# Patient Record
Sex: Female | Born: 1935 | Race: White | Hispanic: No | Marital: Married | Smoking: Never smoker
Health system: Southern US, Community
[De-identification: ages and names within clinical notes are randomized; demographics above are authoritative.]

## PROBLEM LIST (undated history)

## (undated) DIAGNOSIS — R42 Dizziness and giddiness: Secondary | ICD-10-CM

## (undated) DIAGNOSIS — I10 Essential (primary) hypertension: Secondary | ICD-10-CM

## (undated) DIAGNOSIS — N816 Rectocele: Secondary | ICD-10-CM

## (undated) DIAGNOSIS — M1990 Unspecified osteoarthritis, unspecified site: Secondary | ICD-10-CM

## (undated) DIAGNOSIS — K219 Gastro-esophageal reflux disease without esophagitis: Secondary | ICD-10-CM

## (undated) DIAGNOSIS — C801 Malignant (primary) neoplasm, unspecified: Secondary | ICD-10-CM

## (undated) DIAGNOSIS — F0390 Unspecified dementia without behavioral disturbance: Secondary | ICD-10-CM

## (undated) DIAGNOSIS — C55 Malignant neoplasm of uterus, part unspecified: Secondary | ICD-10-CM

## (undated) DIAGNOSIS — Z972 Presence of dental prosthetic device (complete) (partial): Secondary | ICD-10-CM

## (undated) DIAGNOSIS — E7800 Pure hypercholesterolemia, unspecified: Secondary | ICD-10-CM

## (undated) DIAGNOSIS — G8929 Other chronic pain: Secondary | ICD-10-CM

## (undated) DIAGNOSIS — F419 Anxiety disorder, unspecified: Secondary | ICD-10-CM

## (undated) DIAGNOSIS — G4730 Sleep apnea, unspecified: Secondary | ICD-10-CM

## (undated) DIAGNOSIS — J45909 Unspecified asthma, uncomplicated: Secondary | ICD-10-CM

## (undated) DIAGNOSIS — H3530 Unspecified macular degeneration: Secondary | ICD-10-CM

## (undated) DIAGNOSIS — I4510 Unspecified right bundle-branch block: Secondary | ICD-10-CM

## (undated) DIAGNOSIS — F32A Depression, unspecified: Secondary | ICD-10-CM

## (undated) DIAGNOSIS — R51 Headache: Secondary | ICD-10-CM

## (undated) DIAGNOSIS — H409 Unspecified glaucoma: Secondary | ICD-10-CM

## (undated) DIAGNOSIS — F329 Major depressive disorder, single episode, unspecified: Secondary | ICD-10-CM

## (undated) DIAGNOSIS — R519 Headache, unspecified: Secondary | ICD-10-CM

## (undated) DIAGNOSIS — IMO0002 Reserved for concepts with insufficient information to code with codable children: Secondary | ICD-10-CM

## (undated) HISTORY — PX: ABDOMINAL HYSTERECTOMY: SHX81

## (undated) HISTORY — DX: Malignant neoplasm of uterus, part unspecified: C55

## (undated) HISTORY — DX: Anxiety disorder, unspecified: F41.9

## (undated) HISTORY — DX: Other chronic pain: G89.29

## (undated) HISTORY — DX: Unspecified asthma, uncomplicated: J45.909

## (undated) HISTORY — DX: Rectocele: N81.6

## (undated) HISTORY — PX: VULVECTOMY: SHX1086

## (undated) HISTORY — PX: OTHER SURGICAL HISTORY: SHX169

## (undated) HISTORY — PX: REPLACEMENT TOTAL KNEE: SUR1224

## (undated) HISTORY — DX: Major depressive disorder, single episode, unspecified: F32.9

## (undated) HISTORY — DX: Unspecified right bundle-branch block: I45.10

## (undated) HISTORY — DX: Unspecified osteoarthritis, unspecified site: M19.90

## (undated) HISTORY — DX: Essential (primary) hypertension: I10

## (undated) HISTORY — DX: Depression, unspecified: F32.A

## (undated) HISTORY — PX: APPENDECTOMY: SHX54

## (undated) HISTORY — DX: Gastro-esophageal reflux disease without esophagitis: K21.9

## (undated) HISTORY — DX: Headache, unspecified: R51.9

## (undated) HISTORY — DX: Sleep apnea, unspecified: G47.30

## (undated) HISTORY — DX: Headache: R51

## (undated) HISTORY — PX: CHOLECYSTECTOMY: SHX55

## (undated) HISTORY — PX: ABDOMINAL HYSTERECTOMY: SUR658

## (undated) HISTORY — DX: Unspecified glaucoma: H40.9

## (undated) HISTORY — DX: Pure hypercholesterolemia, unspecified: E78.00

## (undated) HISTORY — DX: Reserved for concepts with insufficient information to code with codable children: IMO0002

---

## 2003-10-08 ENCOUNTER — Emergency Department (HOSPITAL_COMMUNITY): Admission: AD | Admit: 2003-10-08 | Discharge: 2003-10-08 | Payer: Self-pay | Admitting: Family Medicine

## 2004-01-05 ENCOUNTER — Inpatient Hospital Stay (HOSPITAL_COMMUNITY): Admission: RE | Admit: 2004-01-05 | Discharge: 2004-01-09 | Payer: Self-pay | Admitting: Orthopaedic Surgery

## 2004-01-09 ENCOUNTER — Inpatient Hospital Stay
Admission: RE | Admit: 2004-01-09 | Discharge: 2004-01-17 | Payer: Self-pay | Admitting: Physical Medicine & Rehabilitation

## 2004-07-07 ENCOUNTER — Encounter: Payer: Self-pay | Admitting: Orthopaedic Surgery

## 2004-09-15 ENCOUNTER — Inpatient Hospital Stay: Payer: Self-pay | Admitting: Rheumatology

## 2004-09-15 ENCOUNTER — Other Ambulatory Visit: Payer: Self-pay

## 2004-09-21 ENCOUNTER — Other Ambulatory Visit: Payer: Self-pay

## 2004-09-21 ENCOUNTER — Emergency Department: Payer: Self-pay | Admitting: Emergency Medicine

## 2004-11-12 ENCOUNTER — Ambulatory Visit: Payer: Self-pay | Admitting: Internal Medicine

## 2005-03-05 ENCOUNTER — Ambulatory Visit: Payer: Self-pay | Admitting: Gastroenterology

## 2005-07-23 ENCOUNTER — Encounter: Payer: Self-pay | Admitting: Orthopaedic Surgery

## 2005-08-07 ENCOUNTER — Encounter: Payer: Self-pay | Admitting: Orthopaedic Surgery

## 2006-03-11 ENCOUNTER — Ambulatory Visit: Payer: Self-pay | Admitting: Internal Medicine

## 2007-09-16 ENCOUNTER — Ambulatory Visit: Payer: Self-pay | Admitting: Internal Medicine

## 2007-11-08 ENCOUNTER — Emergency Department (HOSPITAL_COMMUNITY): Admission: EM | Admit: 2007-11-08 | Discharge: 2007-11-08 | Payer: Self-pay | Admitting: Emergency Medicine

## 2007-11-13 ENCOUNTER — Ambulatory Visit: Payer: Self-pay | Admitting: Internal Medicine

## 2007-12-01 ENCOUNTER — Ambulatory Visit: Payer: Self-pay | Admitting: Internal Medicine

## 2008-10-11 ENCOUNTER — Ambulatory Visit: Payer: Self-pay | Admitting: Internal Medicine

## 2009-09-07 ENCOUNTER — Ambulatory Visit: Payer: Self-pay | Admitting: Internal Medicine

## 2009-12-04 ENCOUNTER — Ambulatory Visit: Payer: Self-pay | Admitting: Otolaryngology

## 2009-12-13 ENCOUNTER — Ambulatory Visit: Payer: Self-pay | Admitting: Unknown Physician Specialty

## 2010-12-04 ENCOUNTER — Ambulatory Visit: Payer: Self-pay | Admitting: Specialist

## 2010-12-20 ENCOUNTER — Ambulatory Visit: Payer: Self-pay | Admitting: Internal Medicine

## 2011-02-22 NOTE — Discharge Summary (Signed)
[name], [name]                           ACCOUNT NO.:  [id number]   MEDICAL RECORD NO.:  [id number]                   PATIENT TYPE:  INP   LOCATION:  5031                                 FACILITY:  [facility]   PHYSICIAN:  Lubertha Basque. Jerl Santos, M.D.             DATE OF BIRTH:  1926-07-05   DATE OF ADMISSION:  01/05/2004  DATE OF DISCHARGE:  01/09/2004                                 DISCHARGE SUMMARY   ADMITTING DIAGNOSES:  1. Left knee end-stage degenerative joint disease.  2. Hypercholesterolemia.  3. Depression.   DISCHARGE DIAGNOSES:  1. Left knee end-stage degenerative joint disease.  2. Hypercholesterolemia.  3. Depression.   OPERATIONS:  Left total knee replacement.   BRIEF HISTORY:  [name] is a 77 year old white female who is a  patient well-known to our practice.  She has been complaining of left knee  pain for quite some time.  She is having pain when she walks and now pain  when she tries to rest in the evening that prevents her from sleeping  soundly.  She has been on multiple non-steroidal anti-inflammatory drugs,  all of which have helped for short periods of time but no long-lasting  benefits.  She has had multiple injections of corticosteroids,  intraarticular, which have also only helped for short periods of time.  Her  x-rays, standing--weightbearing, reveal end-stage DJD of the left knee.  We  have discussed treatment options with [name], that being a total knee  replacement on the left side, due to the fact that she has failed  conservative treatment and her symptoms warrant such a procedure i.e. pain  when she walks, trouble resting at nighttime.   PERTINENT LABORATORY AND X-RAY FINDINGS:  EKG was done and shows normal  sinus rhythm.  She had serial pro times drawn and she was on low-dose  Coumadin protocol and Lovenox initially for DVT prophylaxis.  The last INR  that I have recorded in her chart is 1.6.  The additional lab work, WBC is  10.5,  RBC is 3.21, hemoglobin 9.8.  This did drop from 13.6 postoperatively.  Hematocrit also 29.1 with a drop from 39.1.  Sodium 139, potassium 4.4,  glucose is 90, calcium minimally elevated at 10.6, creatinine 0.9, BUN 10.   COURSE IN THE HOSPITAL:  [name] was admitted postoperatively.  She was  placed on Ancef a gram q.8h. x 3 doses.  She was put on Percocet for pain as  well as PCA standing orders for a morphine pump.  She also was put on  Coumadin protocol and Lovenox for DVT prophylaxis to be regulated by  Pharmacy.  She was also put back on her home medications of Neurontin 100 mg  1 p.o. t.i.d., Zoloft 40 mg 1 a day, Zocor 20 mg 1 a day, and trazodone 100  mg p.o. q.d. p.r.n., knee-high TED stockings.  A Foley catheter was  used.  Knee immobilizer initially.  Physical therapy, occupational therapy, and  rehab consults were also done.  A CPM machine was ordered for 0 to 60 and  advance as tolerated, and then additional lab work postoperatively to check  on her blood counts, as mentioned above, and pro times.  She was given O2  oxygen through nasal cannula, 2 liters for the first 24 hours, and her  dressing was to be reinforced as needed and weightbearing as tolerated.   Her first day postoperatively, her vital signs were recorded as stable.  Her  Foley catheter was in.  Her PCA was controlling her discomfort, and Pharmacy  was helping with DVT prophylaxis with Lovenox and Coumadin.  The second day  postoperatively, her temperature was 100.9, hemoglobin dropped to 9.8, and  dressing was changed, her drain was pulled.  Her wound was noted to be  benign without signs of infection.  Her calf was soft.  No clinical evidence  of a DVT.  Her morphine PCA pump was discontinued.  Vicodin was ordered at  that time.  An OT consult was ordered and was done as well.  A Rehab/SACU  consult was also done during her hospital stay and Therapy had suggested a  stay in one of these units and it looked  like she would be going to SACU.  She could be weightbearing as tolerated on her leg, and on April 4, her  hemoglobin was 9.8, her hematocrit was 29.1, her INR was 1.6, blood pressure  153/64, temperature was 98.  Her wound was benign without sign of infection.  She had normal neurovascular status through her toes.  Her lungs were clear  to A and P.  Her cardiac revealed S1 and S2 without murmur, gallop, or rub.  Her abdomen was soft with positive bowel sounds and no guarding.  Her CPM  machine was going from 0 to 70 degrees and a bed was available in the SACU  unit.   CONDITION ON DISCHARGE:  Improved.   FOLLOW UP:  Followup is as follows:  She will remain on Coumadin regulated  by Pharmacy.  She will remain on Vicodin for discomfort, 1 or 2 p.o. q.4-6h.  for pain.  She will remain on her home medications which were:  1. Neurontin 100 mg 1 p.o. t.i.d.  2. Zoloft 40 mg 1 p.o. q.d.  3. Zocor 20 mg 1 p.o. q.d.  4. Trazodone 100 mg 1 p.o. q.d.  5. An iron pill, ferrous sulfate, was started, 325 mg 1 p.o. q.h.s. for     decrease in hemoglobin postoperatively.   She can be weightbearing as tolerated.  Dressings can be changed on a daily  basis.  Staples would come out at the two-week mark.  She could leave off  the knee immobilizer when she was strong enough to weight bear without it.  Her diet at this point was unrestricted.  We would continue to follow her on  the rehab unit and then on discharge, back in our office.  Our phone number  is [phone] for follow-up appointment.      Lindwood Qua, P.A.                    Lubertha Basque Jerl Santos, M.D.    MC/MEDQ  D:  01/10/2004  T:  01/10/2004  Job:  [id number]

## 2011-02-22 NOTE — Discharge Summary (Signed)
[name], [name]                           ACCOUNT NO.:  [id number]   MEDICAL RECORD NO.:  [id number]                   PATIENT TYPE:  ORB   LOCATION:                                       FACILITY:  [facility]   PHYSICIAN:  Ellwood Dense, M.D.                DATE OF BIRTH:  02-05-1926   DATE OF ADMISSION:  01/09/2004  DATE OF DISCHARGE:  01/17/2004                                 DISCHARGE SUMMARY   DISCHARGE DIAGNOSES:  1. Left total knee replacement secondary to degenerative joint disease,     January 05, 2004.  2. Pain management.  3. Coumadin for deep venous thrombosis prophylaxis.  4. Anemia.  5. Gastroesophageal reflux disease.  6. Hypertension.  7. Hyperlipidemia.  8. Osteoporosis.   HISTORY OF PRESENT ILLNESS:  [age]-year-old white female, admitted  January 05, 2004 with end-stage degenerative joint disease of the left knee,  no relief with conservative care.  Underwent a left total knee replacement,  January 05, 2004, per Dr. Lubertha Basque. Dalldorf.  Placed on Coumadin for deep  venous thrombosis prophylaxis, weightbearing as tolerated, postoperative  pain management and hospital course uneventful.  Latest labs showed an INR  of 1.6, hemoglobin 9.8; admitted for a comprehensive rehab program.   PAST MEDICAL HISTORY:  See discharge diagnoses.   PAST SURGICAL HISTORY:  1. Cholecystectomy.  2. Hysterectomy.   HABITS:  No alcohol or tobacco.   ALLERGIES:  CODEINE and VALIUM.   PRIMARY CARE Ronan Dion:  Dr. Lorin Picket at [facility].   MEDICATIONS PRIOR TO ADMISSION:  1. Zocor.  2. Mobic.  3. Trazodone.  4. Actonel weekly.  5. Neurontin 3 times daily.   SOCIAL HISTORY:  She lives with husband in [place], independent with a  walker prior to admission, 2 local daughters that work, 1-level home with 1  step to entry.   HOSPITAL COURSE:  Patient with progressive gains while in rehab services  with therapies initiated on a daily basis.  The following issues were  followed during patient's rehab course:  Pertaining to [name] left  total knee replacement, surgical site healing nicely, no signs of infection.  She was ambulating at supervision with a walker, weightbearing as tolerated,  range of motion of 80 degrees.  Home health therapies had been arranged.  She remained on Coumadin for deep venous thrombosis prophylaxis, latest INR  of 3.0.  She would be followed by [facility] to complete Coumadin  protocol.  Pain management ongoing with the use of Vicodin and Neurontin  with good results.  Postoperative anemia with latest hemoglobin of 10.1,  hematocrit 29; there were no bleeding episodes; she remained on iron  supplement.  Her blood pressures remained controlled without the use of  antihypertensives, systolic pressures 136, 134 and 154. She would remain on  her Zocor for history of hyperlipidemia.  She had no bowel or bladder  disturbances.  She would be discharged to home with home health physical  therapy as well as a nurse for [facility].   DISCHARGE MEDICATIONS:  Discharge medications included:  1. Coumadin with latest dose of 2.5 mg to be completed April 31, 2005.  2. Trinsicon twice daily.  3. Neurontin 100 mg 3 times daily.  4. Zocor 20 mg daily.  5. Desyrel 100 mg at bedtime.  6. Zoloft 100 mg daily.  7. Protonix 40 mg daily.  8. Vicodin as needed -- pain.   ACTIVITY:  Activity as tolerated.   DIET:  Regular.   WOUND CARE:  Cleanse incision daily, warm water and soap.   SPECIAL INSTRUCTIONS:  Home health physical therapy.  Home health nurse per  [facility] to complete Coumadin protocol.   FOLLOWUP:  Follow up with Dr. Jerl Santos, orthopedic services, call for  appointment.      Mariam Dollar, P.A.                     Ellwood Dense, M.D.    DA/MEDQ  D:  01/16/2004  T:  01/17/2004  Job:  [id number]   cc:   Ellwood Dense, M.D.  [address] [place]  [place] [address]  Fax: [phone]    Lubertha Basque. Jerl Santos, M.D.  [address]  [place]  [place] [address]  Fax: [phone]

## 2011-02-22 NOTE — Op Note (Signed)
[name], [name]                           ACCOUNT NO.:  [id number]   MEDICAL RECORD NO.:  [id number]                   PATIENT TYPE:  INP   LOCATION:  2550                                 FACILITY:  [facility]   PHYSICIAN:  Lubertha Basque. Jerl Santos, M.D.             DATE OF BIRTH:  02-05-1926   DATE OF PROCEDURE:  01/05/2004  DATE OF DISCHARGE:                                 OPERATIVE REPORT   PREOPERATIVE DIAGNOSIS:  Left knee degenerative arthritis.   POSTOPERATIVE DIAGNOSIS:  Left knee degenerative arthritis.   PROCEDURE:  Left total knee replacement.   ANESTHESIA:  Femoral nerve block and spinal.   ATTENDING SURGEON:  Lubertha Basque. Jerl Santos, M.D.   ASSISTANT:  Bradley Ferris, M.D.   INDICATION FOR PROCEDURE:  The patient is a 77 year old woman with a long  history of degenerative arthritis of her knee.  This has persisted despite  various oral anti-inflammatories and injections.  She has been left with  pain which wakes her from sleep and pain with activity and is offered a knee  replacement procedure with end-stage degenerative changes seen mostly in the  lateral compartment by x-ray.  Informed operative consent was obtained after  discussion of the possible complications of, reaction to anesthesia,  infection, DVT, PE, and death.   DESCRIPTION OF PROCEDURE:  The patient was taken to the operating suite,  where a spinal was applied.  She was also given a femoral nerve block in the  preanesthesia area.  She was positioned supine and prepped and draped in the  normal sterile fashion.  After the administration of preop IV antibiotics,  the left leg was elevated, exsanguinated, and a tourniquet inflated about  the thigh.  A longitudinal incision was made anteriorly with dissection down  to the extensor mechanism.  All appropriate anti-infective measures were  used, including closed-hooded exhaust systems for each member of the  surgical team, preoperative IV antibiotic, and a  Betadine-impregnated drape.  A medial parapatellar incision was made through the extensor mechanism.  The  kneecap was flipped and the knee flexed.  She had end-stage degenerative  change of the lateral and patellofemoral compartments.  Her bone quality was  good.  I used an extramedullary guide on the tibia to create a cut on the  tibia parallel with the floor with a slight posterior tilt.  The femur sized  to a standard component and then an intramedullary guide was placed here,  creating anterior and posterior cuts, making a flexion gap of 10 mm.  I then  placed a second intramedullary guide with a 4 degree valgus connection to  make a distal femoral cut, creating an extension gap of 10 mm.  The  appropriate chamfer cutting guide was placed in the femur and the 2.5 tibial  guide was placed and utilized.  The patella was cut down by about 6 or 7 mm  in thickness to  14 mm with the appropriate guide and sized to a 32.  The 32  guide was placed on the patella and utilized.  A trial reduction was done  with these components.  The knee easily came to full extension and flexed  well.  The patella did track well, and no lateral release was required.  The  trial components were removed.  Pulsatile lavage was used to cleanse all cut  bony surfaces.  Cement was mixed including the antibiotic and was  pressurized on all three cut bony surfaces.  The aforementioned DePuy LCS  components were placed.  These were again standard femur, 2.5 tibia, 10 mm  spacer, and 32 mm patella, all polyethylene.  Excess cement was trimmed.  The pressure was held on the components until the cement had hardened.  The  tourniquet was inflated and a mild amount of bleeding was easily controlled  with Bovie cautery.  The knee was again irrigated, followed by placement of  the drain exiting superolaterally.  The extensor mechanism was  reapproximated with #1 Vicryl in interrupted fashion.  The subcutaneous  tissues were  reapproximated with 0 and 2-0 undyed Vicryl, followed by skin  closure with staples.  The knee flexed to 120 degrees at the end of the case  passively.  Adaptic was applied to the wound, followed by dry gauze and a  loose Ace wrap.  Estimated blood loss and intraoperative fluids as well as  accurate tourniquet time can be obtained from anesthesia records.   DISPOSITION:  The patient was extubated in the operating room and taken to  the recovery room in stable condition.  The plans were for her to be  admitted to the orthopedic surgery service for appropriate postop care to  include perioperative antibiotics and Coumadin plus Lovenox for DVT  prophylaxis.                                               Lubertha Basque Jerl Santos, M.D.    PGD/MEDQ  D:  01/05/2004  T:  01/05/2004  Job:  [id number]

## 2011-05-06 ENCOUNTER — Emergency Department: Payer: Self-pay | Admitting: Emergency Medicine

## 2011-06-28 LAB — DIFFERENTIAL
Eosinophils Relative: 2
Lymphocytes Relative: 33
Lymphs Abs: 2.1

## 2011-06-28 LAB — D-DIMER, QUANTITATIVE: D-Dimer, Quant: 0.36

## 2011-06-28 LAB — COMPREHENSIVE METABOLIC PANEL
AST: 22
CO2: 27
Calcium: 9.4
Creatinine, Ser: 0.79
GFR calc Af Amer: >60
GFR calc non Af Amer: >60

## 2011-06-28 LAB — URINALYSIS, ROUTINE W REFLEX MICROSCOPIC
Nitrite: NEGATIVE
Protein, ur: NEGATIVE
Urobilinogen, UA: 0.2

## 2011-06-28 LAB — URINE MICROSCOPIC-ADD ON

## 2011-06-28 LAB — CBC
MCHC: 34
MCV: 95.4
Platelets: 274
RBC: 4.04

## 2011-06-28 LAB — PROTIME-INR
INR: 1
Prothrombin Time: 13

## 2011-10-10 DIAGNOSIS — I10 Essential (primary) hypertension: Secondary | ICD-10-CM | POA: Diagnosis not present

## 2011-10-10 DIAGNOSIS — E7800 Pure hypercholesterolemia, unspecified: Secondary | ICD-10-CM | POA: Diagnosis not present

## 2011-10-10 DIAGNOSIS — M79609 Pain in unspecified limb: Secondary | ICD-10-CM | POA: Diagnosis not present

## 2011-11-19 DIAGNOSIS — M779 Enthesopathy, unspecified: Secondary | ICD-10-CM | POA: Diagnosis not present

## 2011-11-21 DIAGNOSIS — H4010X0 Unspecified open-angle glaucoma, stage unspecified: Secondary | ICD-10-CM | POA: Diagnosis not present

## 2012-01-04 DIAGNOSIS — S91109A Unspecified open wound of unspecified toe(s) without damage to nail, initial encounter: Secondary | ICD-10-CM | POA: Diagnosis not present

## 2012-01-04 DIAGNOSIS — M79609 Pain in unspecified limb: Secondary | ICD-10-CM | POA: Diagnosis not present

## 2012-01-04 DIAGNOSIS — IMO0002 Reserved for concepts with insufficient information to code with codable children: Secondary | ICD-10-CM | POA: Diagnosis not present

## 2012-01-07 DIAGNOSIS — R5381 Other malaise: Secondary | ICD-10-CM | POA: Diagnosis not present

## 2012-01-07 DIAGNOSIS — E7800 Pure hypercholesterolemia, unspecified: Secondary | ICD-10-CM | POA: Diagnosis not present

## 2012-01-07 DIAGNOSIS — I10 Essential (primary) hypertension: Secondary | ICD-10-CM | POA: Diagnosis not present

## 2012-01-14 DIAGNOSIS — M810 Age-related osteoporosis without current pathological fracture: Secondary | ICD-10-CM | POA: Diagnosis not present

## 2012-01-14 DIAGNOSIS — Z124 Encounter for screening for malignant neoplasm of cervix: Secondary | ICD-10-CM | POA: Diagnosis not present

## 2012-01-14 DIAGNOSIS — I10 Essential (primary) hypertension: Secondary | ICD-10-CM | POA: Diagnosis not present

## 2012-01-14 DIAGNOSIS — M79609 Pain in unspecified limb: Secondary | ICD-10-CM | POA: Diagnosis not present

## 2012-01-14 DIAGNOSIS — E7800 Pure hypercholesterolemia, unspecified: Secondary | ICD-10-CM | POA: Diagnosis not present

## 2012-01-20 DIAGNOSIS — Z1211 Encounter for screening for malignant neoplasm of colon: Secondary | ICD-10-CM | POA: Diagnosis not present

## 2012-01-20 LAB — IFOBT (OCCULT BLOOD): IFOBT: NEGATIVE

## 2012-01-21 DIAGNOSIS — IMO0001 Reserved for inherently not codable concepts without codable children: Secondary | ICD-10-CM | POA: Diagnosis not present

## 2012-01-21 DIAGNOSIS — M9981 Other biomechanical lesions of cervical region: Secondary | ICD-10-CM | POA: Diagnosis not present

## 2012-01-21 DIAGNOSIS — M999 Biomechanical lesion, unspecified: Secondary | ICD-10-CM | POA: Diagnosis not present

## 2012-01-21 DIAGNOSIS — M5030 Other cervical disc degeneration, unspecified cervical region: Secondary | ICD-10-CM | POA: Diagnosis not present

## 2012-02-13 ENCOUNTER — Ambulatory Visit: Payer: Self-pay | Admitting: Internal Medicine

## 2012-02-13 DIAGNOSIS — Z1231 Encounter for screening mammogram for malignant neoplasm of breast: Secondary | ICD-10-CM | POA: Diagnosis not present

## 2012-02-24 DIAGNOSIS — M1710 Unilateral primary osteoarthritis, unspecified knee: Secondary | ICD-10-CM | POA: Diagnosis not present

## 2012-02-24 DIAGNOSIS — M19049 Primary osteoarthritis, unspecified hand: Secondary | ICD-10-CM | POA: Diagnosis not present

## 2012-02-25 DIAGNOSIS — R350 Frequency of micturition: Secondary | ICD-10-CM | POA: Diagnosis not present

## 2012-02-25 DIAGNOSIS — J45909 Unspecified asthma, uncomplicated: Secondary | ICD-10-CM | POA: Diagnosis not present

## 2012-02-25 DIAGNOSIS — Z79899 Other long term (current) drug therapy: Secondary | ICD-10-CM | POA: Diagnosis not present

## 2012-02-25 DIAGNOSIS — R05 Cough: Secondary | ICD-10-CM | POA: Diagnosis not present

## 2012-02-25 DIAGNOSIS — R0789 Other chest pain: Secondary | ICD-10-CM | POA: Diagnosis not present

## 2012-02-25 DIAGNOSIS — J310 Chronic rhinitis: Secondary | ICD-10-CM | POA: Diagnosis not present

## 2012-02-25 DIAGNOSIS — M810 Age-related osteoporosis without current pathological fracture: Secondary | ICD-10-CM | POA: Diagnosis not present

## 2012-02-26 DIAGNOSIS — R319 Hematuria, unspecified: Secondary | ICD-10-CM | POA: Diagnosis not present

## 2012-03-24 DIAGNOSIS — J029 Acute pharyngitis, unspecified: Secondary | ICD-10-CM | POA: Diagnosis not present

## 2012-03-24 DIAGNOSIS — R05 Cough: Secondary | ICD-10-CM | POA: Diagnosis not present

## 2012-04-11 DIAGNOSIS — J039 Acute tonsillitis, unspecified: Secondary | ICD-10-CM | POA: Diagnosis not present

## 2012-04-11 DIAGNOSIS — J0110 Acute frontal sinusitis, unspecified: Secondary | ICD-10-CM | POA: Diagnosis not present

## 2012-04-11 DIAGNOSIS — R51 Headache: Secondary | ICD-10-CM | POA: Diagnosis not present

## 2012-04-11 DIAGNOSIS — J029 Acute pharyngitis, unspecified: Secondary | ICD-10-CM | POA: Diagnosis not present

## 2012-04-13 DIAGNOSIS — M1710 Unilateral primary osteoarthritis, unspecified knee: Secondary | ICD-10-CM | POA: Diagnosis not present

## 2012-04-16 DIAGNOSIS — H353190 Nonexudative age-related macular degeneration, unspecified eye, stage unspecified: Secondary | ICD-10-CM | POA: Diagnosis not present

## 2012-04-20 DIAGNOSIS — M1710 Unilateral primary osteoarthritis, unspecified knee: Secondary | ICD-10-CM | POA: Diagnosis not present

## 2012-04-27 DIAGNOSIS — M1710 Unilateral primary osteoarthritis, unspecified knee: Secondary | ICD-10-CM | POA: Diagnosis not present

## 2012-05-06 DIAGNOSIS — M1710 Unilateral primary osteoarthritis, unspecified knee: Secondary | ICD-10-CM | POA: Diagnosis not present

## 2012-05-11 DIAGNOSIS — M1710 Unilateral primary osteoarthritis, unspecified knee: Secondary | ICD-10-CM | POA: Diagnosis not present

## 2012-05-12 DIAGNOSIS — M5137 Other intervertebral disc degeneration, lumbosacral region: Secondary | ICD-10-CM | POA: Diagnosis not present

## 2012-05-12 DIAGNOSIS — M999 Biomechanical lesion, unspecified: Secondary | ICD-10-CM | POA: Diagnosis not present

## 2012-05-12 DIAGNOSIS — M5030 Other cervical disc degeneration, unspecified cervical region: Secondary | ICD-10-CM | POA: Diagnosis not present

## 2012-05-12 DIAGNOSIS — M9981 Other biomechanical lesions of cervical region: Secondary | ICD-10-CM | POA: Diagnosis not present

## 2012-05-19 DIAGNOSIS — IMO0002 Reserved for concepts with insufficient information to code with codable children: Secondary | ICD-10-CM | POA: Diagnosis not present

## 2012-05-19 DIAGNOSIS — R0602 Shortness of breath: Secondary | ICD-10-CM | POA: Diagnosis not present

## 2012-05-19 DIAGNOSIS — I10 Essential (primary) hypertension: Secondary | ICD-10-CM | POA: Diagnosis not present

## 2012-05-19 DIAGNOSIS — M5030 Other cervical disc degeneration, unspecified cervical region: Secondary | ICD-10-CM | POA: Diagnosis not present

## 2012-05-19 DIAGNOSIS — N390 Urinary tract infection, site not specified: Secondary | ICD-10-CM | POA: Diagnosis not present

## 2012-05-19 DIAGNOSIS — J309 Allergic rhinitis, unspecified: Secondary | ICD-10-CM | POA: Diagnosis not present

## 2012-05-19 DIAGNOSIS — M9981 Other biomechanical lesions of cervical region: Secondary | ICD-10-CM | POA: Diagnosis not present

## 2012-05-19 DIAGNOSIS — R634 Abnormal weight loss: Secondary | ICD-10-CM | POA: Diagnosis not present

## 2012-05-19 DIAGNOSIS — M999 Biomechanical lesion, unspecified: Secondary | ICD-10-CM | POA: Diagnosis not present

## 2012-06-01 DIAGNOSIS — R42 Dizziness and giddiness: Secondary | ICD-10-CM | POA: Diagnosis not present

## 2012-06-01 DIAGNOSIS — H9209 Otalgia, unspecified ear: Secondary | ICD-10-CM | POA: Diagnosis not present

## 2012-06-01 DIAGNOSIS — J019 Acute sinusitis, unspecified: Secondary | ICD-10-CM | POA: Diagnosis not present

## 2012-06-01 DIAGNOSIS — J029 Acute pharyngitis, unspecified: Secondary | ICD-10-CM | POA: Diagnosis not present

## 2012-06-15 DIAGNOSIS — R42 Dizziness and giddiness: Secondary | ICD-10-CM | POA: Diagnosis not present

## 2012-06-17 DIAGNOSIS — M1710 Unilateral primary osteoarthritis, unspecified knee: Secondary | ICD-10-CM | POA: Diagnosis not present

## 2012-06-22 DIAGNOSIS — H9319 Tinnitus, unspecified ear: Secondary | ICD-10-CM | POA: Diagnosis not present

## 2012-06-22 DIAGNOSIS — J310 Chronic rhinitis: Secondary | ICD-10-CM | POA: Diagnosis not present

## 2012-06-22 DIAGNOSIS — H905 Unspecified sensorineural hearing loss: Secondary | ICD-10-CM | POA: Diagnosis not present

## 2012-06-22 DIAGNOSIS — R42 Dizziness and giddiness: Secondary | ICD-10-CM | POA: Diagnosis not present

## 2012-07-03 DIAGNOSIS — R42 Dizziness and giddiness: Secondary | ICD-10-CM | POA: Diagnosis not present

## 2012-07-08 DIAGNOSIS — M9981 Other biomechanical lesions of cervical region: Secondary | ICD-10-CM | POA: Diagnosis not present

## 2012-07-08 DIAGNOSIS — IMO0002 Reserved for concepts with insufficient information to code with codable children: Secondary | ICD-10-CM | POA: Diagnosis not present

## 2012-07-08 DIAGNOSIS — H814 Vertigo of central origin: Secondary | ICD-10-CM | POA: Diagnosis not present

## 2012-07-08 DIAGNOSIS — M999 Biomechanical lesion, unspecified: Secondary | ICD-10-CM | POA: Diagnosis not present

## 2012-07-08 DIAGNOSIS — M5030 Other cervical disc degeneration, unspecified cervical region: Secondary | ICD-10-CM | POA: Diagnosis not present

## 2012-07-08 DIAGNOSIS — R42 Dizziness and giddiness: Secondary | ICD-10-CM | POA: Diagnosis not present

## 2012-07-09 DIAGNOSIS — N390 Urinary tract infection, site not specified: Secondary | ICD-10-CM | POA: Diagnosis not present

## 2012-07-13 DIAGNOSIS — IMO0002 Reserved for concepts with insufficient information to code with codable children: Secondary | ICD-10-CM | POA: Diagnosis not present

## 2012-07-13 DIAGNOSIS — M9981 Other biomechanical lesions of cervical region: Secondary | ICD-10-CM | POA: Diagnosis not present

## 2012-07-13 DIAGNOSIS — M999 Biomechanical lesion, unspecified: Secondary | ICD-10-CM | POA: Diagnosis not present

## 2012-07-13 DIAGNOSIS — M5030 Other cervical disc degeneration, unspecified cervical region: Secondary | ICD-10-CM | POA: Diagnosis not present

## 2012-07-20 ENCOUNTER — Other Ambulatory Visit: Payer: Self-pay | Admitting: Neurology

## 2012-07-20 DIAGNOSIS — R51 Headache: Secondary | ICD-10-CM

## 2012-07-20 DIAGNOSIS — R42 Dizziness and giddiness: Secondary | ICD-10-CM | POA: Diagnosis not present

## 2012-07-20 DIAGNOSIS — E785 Hyperlipidemia, unspecified: Secondary | ICD-10-CM

## 2012-07-20 DIAGNOSIS — Z8542 Personal history of malignant neoplasm of other parts of uterus: Secondary | ICD-10-CM | POA: Diagnosis not present

## 2012-07-20 DIAGNOSIS — R269 Unspecified abnormalities of gait and mobility: Secondary | ICD-10-CM

## 2012-07-20 DIAGNOSIS — C55 Malignant neoplasm of uterus, part unspecified: Secondary | ICD-10-CM

## 2012-07-21 DIAGNOSIS — R5383 Other fatigue: Secondary | ICD-10-CM | POA: Diagnosis not present

## 2012-07-21 DIAGNOSIS — R634 Abnormal weight loss: Secondary | ICD-10-CM | POA: Diagnosis not present

## 2012-07-21 DIAGNOSIS — M1710 Unilateral primary osteoarthritis, unspecified knee: Secondary | ICD-10-CM | POA: Diagnosis not present

## 2012-07-21 DIAGNOSIS — R42 Dizziness and giddiness: Secondary | ICD-10-CM | POA: Diagnosis not present

## 2012-07-21 DIAGNOSIS — R5381 Other malaise: Secondary | ICD-10-CM | POA: Diagnosis not present

## 2012-07-21 DIAGNOSIS — Z87440 Personal history of urinary (tract) infections: Secondary | ICD-10-CM | POA: Diagnosis not present

## 2012-07-21 DIAGNOSIS — Z96659 Presence of unspecified artificial knee joint: Secondary | ICD-10-CM | POA: Diagnosis not present

## 2012-07-21 DIAGNOSIS — R51 Headache: Secondary | ICD-10-CM | POA: Diagnosis not present

## 2012-07-21 DIAGNOSIS — Z8542 Personal history of malignant neoplasm of other parts of uterus: Secondary | ICD-10-CM | POA: Diagnosis not present

## 2012-07-21 DIAGNOSIS — Z23 Encounter for immunization: Secondary | ICD-10-CM | POA: Diagnosis not present

## 2012-07-21 DIAGNOSIS — I10 Essential (primary) hypertension: Secondary | ICD-10-CM | POA: Diagnosis not present

## 2012-07-21 DIAGNOSIS — R269 Unspecified abnormalities of gait and mobility: Secondary | ICD-10-CM | POA: Diagnosis not present

## 2012-07-22 DIAGNOSIS — Z87440 Personal history of urinary (tract) infections: Secondary | ICD-10-CM | POA: Diagnosis not present

## 2012-07-22 DIAGNOSIS — R269 Unspecified abnormalities of gait and mobility: Secondary | ICD-10-CM | POA: Diagnosis not present

## 2012-07-22 DIAGNOSIS — M1710 Unilateral primary osteoarthritis, unspecified knee: Secondary | ICD-10-CM | POA: Diagnosis not present

## 2012-07-22 DIAGNOSIS — Z8542 Personal history of malignant neoplasm of other parts of uterus: Secondary | ICD-10-CM | POA: Diagnosis not present

## 2012-07-22 DIAGNOSIS — R42 Dizziness and giddiness: Secondary | ICD-10-CM | POA: Diagnosis not present

## 2012-07-22 DIAGNOSIS — Z96659 Presence of unspecified artificial knee joint: Secondary | ICD-10-CM | POA: Diagnosis not present

## 2012-07-24 ENCOUNTER — Ambulatory Visit
Admission: RE | Admit: 2012-07-24 | Discharge: 2012-07-24 | Disposition: A | Discharge status: Home / Self Care | Payer: Medicare Other | Source: Ambulatory Visit | Attending: Neurology | Admitting: Neurology

## 2012-07-24 DIAGNOSIS — C55 Malignant neoplasm of uterus, part unspecified: Secondary | ICD-10-CM

## 2012-07-24 DIAGNOSIS — R269 Unspecified abnormalities of gait and mobility: Secondary | ICD-10-CM

## 2012-07-24 DIAGNOSIS — R51 Headache: Secondary | ICD-10-CM | POA: Diagnosis not present

## 2012-07-24 DIAGNOSIS — R42 Dizziness and giddiness: Secondary | ICD-10-CM | POA: Diagnosis not present

## 2012-07-24 DIAGNOSIS — E785 Hyperlipidemia, unspecified: Secondary | ICD-10-CM

## 2012-07-24 DIAGNOSIS — R05 Cough: Secondary | ICD-10-CM | POA: Diagnosis not present

## 2012-07-27 DIAGNOSIS — R269 Unspecified abnormalities of gait and mobility: Secondary | ICD-10-CM | POA: Diagnosis not present

## 2012-07-27 DIAGNOSIS — M1710 Unilateral primary osteoarthritis, unspecified knee: Secondary | ICD-10-CM | POA: Diagnosis not present

## 2012-07-27 DIAGNOSIS — R42 Dizziness and giddiness: Secondary | ICD-10-CM | POA: Diagnosis not present

## 2012-07-27 DIAGNOSIS — Z96659 Presence of unspecified artificial knee joint: Secondary | ICD-10-CM | POA: Diagnosis not present

## 2012-07-27 DIAGNOSIS — Z8542 Personal history of malignant neoplasm of other parts of uterus: Secondary | ICD-10-CM | POA: Diagnosis not present

## 2012-07-27 DIAGNOSIS — Z87440 Personal history of urinary (tract) infections: Secondary | ICD-10-CM | POA: Diagnosis not present

## 2012-07-28 DIAGNOSIS — R269 Unspecified abnormalities of gait and mobility: Secondary | ICD-10-CM | POA: Diagnosis not present

## 2012-07-28 DIAGNOSIS — Z87440 Personal history of urinary (tract) infections: Secondary | ICD-10-CM | POA: Diagnosis not present

## 2012-07-28 DIAGNOSIS — M1710 Unilateral primary osteoarthritis, unspecified knee: Secondary | ICD-10-CM | POA: Diagnosis not present

## 2012-07-28 DIAGNOSIS — Z96659 Presence of unspecified artificial knee joint: Secondary | ICD-10-CM | POA: Diagnosis not present

## 2012-07-28 DIAGNOSIS — Z8542 Personal history of malignant neoplasm of other parts of uterus: Secondary | ICD-10-CM | POA: Diagnosis not present

## 2012-07-28 DIAGNOSIS — R42 Dizziness and giddiness: Secondary | ICD-10-CM | POA: Diagnosis not present

## 2012-07-29 DIAGNOSIS — R269 Unspecified abnormalities of gait and mobility: Secondary | ICD-10-CM | POA: Diagnosis not present

## 2012-07-29 DIAGNOSIS — R42 Dizziness and giddiness: Secondary | ICD-10-CM | POA: Diagnosis not present

## 2012-07-29 DIAGNOSIS — Z87440 Personal history of urinary (tract) infections: Secondary | ICD-10-CM | POA: Diagnosis not present

## 2012-07-29 DIAGNOSIS — Z8542 Personal history of malignant neoplasm of other parts of uterus: Secondary | ICD-10-CM | POA: Diagnosis not present

## 2012-07-29 DIAGNOSIS — Z96659 Presence of unspecified artificial knee joint: Secondary | ICD-10-CM | POA: Diagnosis not present

## 2012-07-29 DIAGNOSIS — M1710 Unilateral primary osteoarthritis, unspecified knee: Secondary | ICD-10-CM | POA: Diagnosis not present

## 2012-07-31 DIAGNOSIS — M1710 Unilateral primary osteoarthritis, unspecified knee: Secondary | ICD-10-CM | POA: Diagnosis not present

## 2012-07-31 DIAGNOSIS — R42 Dizziness and giddiness: Secondary | ICD-10-CM | POA: Diagnosis not present

## 2012-07-31 DIAGNOSIS — Z96659 Presence of unspecified artificial knee joint: Secondary | ICD-10-CM | POA: Diagnosis not present

## 2012-07-31 DIAGNOSIS — Z8542 Personal history of malignant neoplasm of other parts of uterus: Secondary | ICD-10-CM | POA: Diagnosis not present

## 2012-07-31 DIAGNOSIS — Z87440 Personal history of urinary (tract) infections: Secondary | ICD-10-CM | POA: Diagnosis not present

## 2012-07-31 DIAGNOSIS — R269 Unspecified abnormalities of gait and mobility: Secondary | ICD-10-CM | POA: Diagnosis not present

## 2012-08-03 DIAGNOSIS — Z87440 Personal history of urinary (tract) infections: Secondary | ICD-10-CM | POA: Diagnosis not present

## 2012-08-03 DIAGNOSIS — M1710 Unilateral primary osteoarthritis, unspecified knee: Secondary | ICD-10-CM | POA: Diagnosis not present

## 2012-08-03 DIAGNOSIS — Z8542 Personal history of malignant neoplasm of other parts of uterus: Secondary | ICD-10-CM | POA: Diagnosis not present

## 2012-08-03 DIAGNOSIS — R42 Dizziness and giddiness: Secondary | ICD-10-CM | POA: Diagnosis not present

## 2012-08-03 DIAGNOSIS — R269 Unspecified abnormalities of gait and mobility: Secondary | ICD-10-CM | POA: Diagnosis not present

## 2012-08-03 DIAGNOSIS — Z96659 Presence of unspecified artificial knee joint: Secondary | ICD-10-CM | POA: Diagnosis not present

## 2012-08-04 DIAGNOSIS — Z8542 Personal history of malignant neoplasm of other parts of uterus: Secondary | ICD-10-CM | POA: Diagnosis not present

## 2012-08-04 DIAGNOSIS — Z87440 Personal history of urinary (tract) infections: Secondary | ICD-10-CM | POA: Diagnosis not present

## 2012-08-04 DIAGNOSIS — M1710 Unilateral primary osteoarthritis, unspecified knee: Secondary | ICD-10-CM | POA: Diagnosis not present

## 2012-08-04 DIAGNOSIS — Z96659 Presence of unspecified artificial knee joint: Secondary | ICD-10-CM | POA: Diagnosis not present

## 2012-08-04 DIAGNOSIS — R42 Dizziness and giddiness: Secondary | ICD-10-CM | POA: Diagnosis not present

## 2012-08-04 DIAGNOSIS — R269 Unspecified abnormalities of gait and mobility: Secondary | ICD-10-CM | POA: Diagnosis not present

## 2012-08-05 DIAGNOSIS — R269 Unspecified abnormalities of gait and mobility: Secondary | ICD-10-CM | POA: Diagnosis not present

## 2012-08-06 DIAGNOSIS — R42 Dizziness and giddiness: Secondary | ICD-10-CM | POA: Diagnosis not present

## 2012-08-06 DIAGNOSIS — R269 Unspecified abnormalities of gait and mobility: Secondary | ICD-10-CM | POA: Diagnosis not present

## 2012-08-06 DIAGNOSIS — M1710 Unilateral primary osteoarthritis, unspecified knee: Secondary | ICD-10-CM | POA: Diagnosis not present

## 2012-08-06 DIAGNOSIS — Z87440 Personal history of urinary (tract) infections: Secondary | ICD-10-CM | POA: Diagnosis not present

## 2012-08-06 DIAGNOSIS — Z8542 Personal history of malignant neoplasm of other parts of uterus: Secondary | ICD-10-CM | POA: Diagnosis not present

## 2012-08-06 DIAGNOSIS — Z96659 Presence of unspecified artificial knee joint: Secondary | ICD-10-CM | POA: Diagnosis not present

## 2012-08-07 DIAGNOSIS — R269 Unspecified abnormalities of gait and mobility: Secondary | ICD-10-CM | POA: Diagnosis not present

## 2012-08-07 DIAGNOSIS — Z87440 Personal history of urinary (tract) infections: Secondary | ICD-10-CM | POA: Diagnosis not present

## 2012-08-07 DIAGNOSIS — E785 Hyperlipidemia, unspecified: Secondary | ICD-10-CM | POA: Diagnosis not present

## 2012-08-07 DIAGNOSIS — M1710 Unilateral primary osteoarthritis, unspecified knee: Secondary | ICD-10-CM | POA: Diagnosis not present

## 2012-08-07 DIAGNOSIS — Z96659 Presence of unspecified artificial knee joint: Secondary | ICD-10-CM | POA: Diagnosis not present

## 2012-08-07 DIAGNOSIS — R51 Headache: Secondary | ICD-10-CM | POA: Diagnosis not present

## 2012-08-07 DIAGNOSIS — R42 Dizziness and giddiness: Secondary | ICD-10-CM | POA: Diagnosis not present

## 2012-08-07 DIAGNOSIS — Z8542 Personal history of malignant neoplasm of other parts of uterus: Secondary | ICD-10-CM | POA: Diagnosis not present

## 2012-08-10 DIAGNOSIS — N390 Urinary tract infection, site not specified: Secondary | ICD-10-CM | POA: Diagnosis not present

## 2012-08-11 DIAGNOSIS — R269 Unspecified abnormalities of gait and mobility: Secondary | ICD-10-CM | POA: Diagnosis not present

## 2012-08-11 DIAGNOSIS — Z96659 Presence of unspecified artificial knee joint: Secondary | ICD-10-CM | POA: Diagnosis not present

## 2012-08-11 DIAGNOSIS — R42 Dizziness and giddiness: Secondary | ICD-10-CM | POA: Diagnosis not present

## 2012-08-11 DIAGNOSIS — M1710 Unilateral primary osteoarthritis, unspecified knee: Secondary | ICD-10-CM | POA: Diagnosis not present

## 2012-08-11 DIAGNOSIS — Z8542 Personal history of malignant neoplasm of other parts of uterus: Secondary | ICD-10-CM | POA: Diagnosis not present

## 2012-08-11 DIAGNOSIS — Z87440 Personal history of urinary (tract) infections: Secondary | ICD-10-CM | POA: Diagnosis not present

## 2012-08-13 DIAGNOSIS — Z8542 Personal history of malignant neoplasm of other parts of uterus: Secondary | ICD-10-CM | POA: Diagnosis not present

## 2012-08-13 DIAGNOSIS — Z87440 Personal history of urinary (tract) infections: Secondary | ICD-10-CM | POA: Diagnosis not present

## 2012-08-13 DIAGNOSIS — Z96659 Presence of unspecified artificial knee joint: Secondary | ICD-10-CM | POA: Diagnosis not present

## 2012-08-13 DIAGNOSIS — R269 Unspecified abnormalities of gait and mobility: Secondary | ICD-10-CM | POA: Diagnosis not present

## 2012-08-13 DIAGNOSIS — M1710 Unilateral primary osteoarthritis, unspecified knee: Secondary | ICD-10-CM | POA: Diagnosis not present

## 2012-08-13 DIAGNOSIS — R42 Dizziness and giddiness: Secondary | ICD-10-CM | POA: Diagnosis not present

## 2012-08-14 DIAGNOSIS — H4010X0 Unspecified open-angle glaucoma, stage unspecified: Secondary | ICD-10-CM | POA: Diagnosis not present

## 2012-08-14 DIAGNOSIS — M5030 Other cervical disc degeneration, unspecified cervical region: Secondary | ICD-10-CM | POA: Diagnosis not present

## 2012-08-14 DIAGNOSIS — M19019 Primary osteoarthritis, unspecified shoulder: Secondary | ICD-10-CM | POA: Diagnosis not present

## 2012-08-17 DIAGNOSIS — R42 Dizziness and giddiness: Secondary | ICD-10-CM | POA: Diagnosis not present

## 2012-08-17 DIAGNOSIS — Z87440 Personal history of urinary (tract) infections: Secondary | ICD-10-CM | POA: Diagnosis not present

## 2012-08-17 DIAGNOSIS — Z8542 Personal history of malignant neoplasm of other parts of uterus: Secondary | ICD-10-CM | POA: Diagnosis not present

## 2012-08-17 DIAGNOSIS — Z96659 Presence of unspecified artificial knee joint: Secondary | ICD-10-CM | POA: Diagnosis not present

## 2012-08-17 DIAGNOSIS — R269 Unspecified abnormalities of gait and mobility: Secondary | ICD-10-CM | POA: Diagnosis not present

## 2012-08-17 DIAGNOSIS — M1710 Unilateral primary osteoarthritis, unspecified knee: Secondary | ICD-10-CM | POA: Diagnosis not present

## 2012-08-18 DIAGNOSIS — R269 Unspecified abnormalities of gait and mobility: Secondary | ICD-10-CM | POA: Diagnosis not present

## 2012-08-18 DIAGNOSIS — R42 Dizziness and giddiness: Secondary | ICD-10-CM | POA: Diagnosis not present

## 2012-08-18 DIAGNOSIS — Z96659 Presence of unspecified artificial knee joint: Secondary | ICD-10-CM | POA: Diagnosis not present

## 2012-08-18 DIAGNOSIS — Z87440 Personal history of urinary (tract) infections: Secondary | ICD-10-CM | POA: Diagnosis not present

## 2012-08-18 DIAGNOSIS — M1710 Unilateral primary osteoarthritis, unspecified knee: Secondary | ICD-10-CM | POA: Diagnosis not present

## 2012-08-18 DIAGNOSIS — Z8542 Personal history of malignant neoplasm of other parts of uterus: Secondary | ICD-10-CM | POA: Diagnosis not present

## 2012-08-20 DIAGNOSIS — Z96659 Presence of unspecified artificial knee joint: Secondary | ICD-10-CM | POA: Diagnosis not present

## 2012-08-20 DIAGNOSIS — Z87440 Personal history of urinary (tract) infections: Secondary | ICD-10-CM | POA: Diagnosis not present

## 2012-08-20 DIAGNOSIS — Z8542 Personal history of malignant neoplasm of other parts of uterus: Secondary | ICD-10-CM | POA: Diagnosis not present

## 2012-08-20 DIAGNOSIS — M1710 Unilateral primary osteoarthritis, unspecified knee: Secondary | ICD-10-CM | POA: Diagnosis not present

## 2012-08-20 DIAGNOSIS — R269 Unspecified abnormalities of gait and mobility: Secondary | ICD-10-CM | POA: Diagnosis not present

## 2012-08-20 DIAGNOSIS — R42 Dizziness and giddiness: Secondary | ICD-10-CM | POA: Diagnosis not present

## 2012-08-21 DIAGNOSIS — M1710 Unilateral primary osteoarthritis, unspecified knee: Secondary | ICD-10-CM | POA: Diagnosis not present

## 2012-08-21 DIAGNOSIS — Z87440 Personal history of urinary (tract) infections: Secondary | ICD-10-CM | POA: Diagnosis not present

## 2012-08-21 DIAGNOSIS — R42 Dizziness and giddiness: Secondary | ICD-10-CM | POA: Diagnosis not present

## 2012-08-21 DIAGNOSIS — Z96659 Presence of unspecified artificial knee joint: Secondary | ICD-10-CM | POA: Diagnosis not present

## 2012-08-21 DIAGNOSIS — Z8542 Personal history of malignant neoplasm of other parts of uterus: Secondary | ICD-10-CM | POA: Diagnosis not present

## 2012-08-21 DIAGNOSIS — R269 Unspecified abnormalities of gait and mobility: Secondary | ICD-10-CM | POA: Diagnosis not present

## 2012-08-25 DIAGNOSIS — Z96659 Presence of unspecified artificial knee joint: Secondary | ICD-10-CM | POA: Diagnosis not present

## 2012-08-25 DIAGNOSIS — M1710 Unilateral primary osteoarthritis, unspecified knee: Secondary | ICD-10-CM | POA: Diagnosis not present

## 2012-08-25 DIAGNOSIS — R42 Dizziness and giddiness: Secondary | ICD-10-CM | POA: Diagnosis not present

## 2012-08-25 DIAGNOSIS — Z8542 Personal history of malignant neoplasm of other parts of uterus: Secondary | ICD-10-CM | POA: Diagnosis not present

## 2012-08-25 DIAGNOSIS — R269 Unspecified abnormalities of gait and mobility: Secondary | ICD-10-CM | POA: Diagnosis not present

## 2012-08-25 DIAGNOSIS — Z87440 Personal history of urinary (tract) infections: Secondary | ICD-10-CM | POA: Diagnosis not present

## 2012-08-27 DIAGNOSIS — Z87440 Personal history of urinary (tract) infections: Secondary | ICD-10-CM | POA: Diagnosis not present

## 2012-08-27 DIAGNOSIS — R42 Dizziness and giddiness: Secondary | ICD-10-CM | POA: Diagnosis not present

## 2012-08-27 DIAGNOSIS — Z8542 Personal history of malignant neoplasm of other parts of uterus: Secondary | ICD-10-CM | POA: Diagnosis not present

## 2012-08-27 DIAGNOSIS — R269 Unspecified abnormalities of gait and mobility: Secondary | ICD-10-CM | POA: Diagnosis not present

## 2012-08-27 DIAGNOSIS — Z96659 Presence of unspecified artificial knee joint: Secondary | ICD-10-CM | POA: Diagnosis not present

## 2012-08-27 DIAGNOSIS — M1710 Unilateral primary osteoarthritis, unspecified knee: Secondary | ICD-10-CM | POA: Diagnosis not present

## 2012-09-07 DIAGNOSIS — N390 Urinary tract infection, site not specified: Secondary | ICD-10-CM | POA: Diagnosis not present

## 2012-09-07 DIAGNOSIS — F411 Generalized anxiety disorder: Secondary | ICD-10-CM | POA: Diagnosis not present

## 2012-09-08 ENCOUNTER — Encounter: Payer: Self-pay | Admitting: Otolaryngology

## 2012-09-08 DIAGNOSIS — IMO0001 Reserved for inherently not codable concepts without codable children: Secondary | ICD-10-CM | POA: Diagnosis not present

## 2012-09-08 DIAGNOSIS — R42 Dizziness and giddiness: Secondary | ICD-10-CM | POA: Diagnosis not present

## 2012-09-08 DIAGNOSIS — R262 Difficulty in walking, not elsewhere classified: Secondary | ICD-10-CM | POA: Diagnosis not present

## 2012-09-10 ENCOUNTER — Emergency Department (HOSPITAL_COMMUNITY): Payer: Medicare Other

## 2012-09-10 ENCOUNTER — Emergency Department (HOSPITAL_COMMUNITY)
Admission: EM | Admit: 2012-09-10 | Discharge: 2012-09-10 | Disposition: A | Discharge status: Home / Self Care | Payer: Medicare Other | Attending: Emergency Medicine | Admitting: Emergency Medicine

## 2012-09-10 ENCOUNTER — Encounter (HOSPITAL_COMMUNITY): Payer: Self-pay | Admitting: *Deleted

## 2012-09-10 DIAGNOSIS — I10 Essential (primary) hypertension: Secondary | ICD-10-CM | POA: Insufficient documentation

## 2012-09-10 DIAGNOSIS — Z7982 Long term (current) use of aspirin: Secondary | ICD-10-CM | POA: Diagnosis not present

## 2012-09-10 DIAGNOSIS — Z8551 Personal history of malignant neoplasm of bladder: Secondary | ICD-10-CM | POA: Insufficient documentation

## 2012-09-10 DIAGNOSIS — N390 Urinary tract infection, site not specified: Secondary | ICD-10-CM | POA: Insufficient documentation

## 2012-09-10 DIAGNOSIS — R42 Dizziness and giddiness: Secondary | ICD-10-CM | POA: Diagnosis not present

## 2012-09-10 DIAGNOSIS — R51 Headache: Secondary | ICD-10-CM

## 2012-09-10 HISTORY — DX: Malignant (primary) neoplasm, unspecified: C80.1

## 2012-09-10 LAB — BASIC METABOLIC PANEL WITH GFR
BUN: 19 mg/dL (ref 6–23)
CO2: 27 meq/L (ref 19–32)
Calcium: 9.5 mg/dL (ref 8.4–10.5)
Chloride: 105 meq/L (ref 96–112)
Creatinine, Ser: 0.9 mg/dL (ref 0.50–1.10)
GFR calc Af Amer: 66 mL/min — ABNORMAL LOW
GFR calc non Af Amer: 57 mL/min — ABNORMAL LOW
Glucose, Bld: 110 mg/dL — ABNORMAL HIGH (ref 70–99)
Potassium: 4.3 meq/L (ref 3.5–5.1)
Sodium: 140 meq/L (ref 135–145)

## 2012-09-10 LAB — URINALYSIS, ROUTINE W REFLEX MICROSCOPIC
Bilirubin Urine: NEGATIVE
Hgb urine dipstick: NEGATIVE
Specific Gravity, Urine: 1.01 (ref 1.005–1.030)
pH: 5 (ref 5.0–8.0)

## 2012-09-10 LAB — CBC WITH DIFFERENTIAL/PLATELET
Basophils Relative: 1 % (ref 0–1)
Eosinophils Relative: 3 % (ref 0–5)
Hemoglobin: 11.8 g/dL — ABNORMAL LOW (ref 12.0–15.0)
Lymphs Abs: 1.6 10*3/uL (ref 0.7–4.0)
MCH: 32.6 pg (ref 26.0–34.0)
MCV: 95.3 fL (ref 78.0–100.0)
Monocytes Absolute: 0.5 10*3/uL (ref 0.1–1.0)
RBC: 3.62 MIL/uL — ABNORMAL LOW (ref 3.87–5.11)

## 2012-09-10 LAB — URINE MICROSCOPIC-ADD ON

## 2012-09-10 MED ORDER — CEPHALEXIN 500 MG PO CAPS: 500.0000 mg | ORAL_CAPSULE | Freq: Three times a day (TID) | ORAL | Status: DC

## 2012-09-10 NOTE — ED Provider Notes (Signed)
History     CSN: [id number]  Arrival date & time 09/10/12  0808   First MD Initiated Contact with Patient 09/10/12 [phone]      Chief Complaint  Patient presents with  . Headache  . Dizziness    (Consider location/radiation/quality/duration/timing/severity/associated sxs/prior treatment) Patient is a 78 y.o. female presenting with headaches and neurologic complaint. The history is provided by the patient.  Headache  This is a chronic problem. The current episode started more than 1 week ago. Episode frequency: Daily, constant during the day. The problem has been gradually worsening. Associated with: Getting up in the morning. The pain is located in the frontal region. The quality of the pain is described as throbbing. The pain is moderate. The pain does not radiate. Pertinent negatives include no fever, no chest pressure, no syncope, no shortness of breath, no nausea and no vomiting. She has tried nothing for the symptoms.  Neurologic Problem The primary symptoms include headaches and dizziness. Primary symptoms do not include syncope, fever, nausea or vomiting. The neurological symptoms are diffuse.  She describes the dizziness as a sensation of spinning. The dizziness began more than 1 week ago. The dizziness has been gradually worsening since its onset. It is a recurrent problem. Associated with: getting up in the morning. Dizziness does not occur with nausea or vomiting.  Workup history includes MRI (about 1 month ago, negative).    Past Medical History  Diagnosis Date  . Cancer     bladder    Past Surgical History  Procedure Date  . Cholecystectomy   . Appendectomy     No family history on file.  History  Substance Use Topics  . Smoking status: Never Smoker   . Smokeless tobacco: Not on file  . Alcohol Use: No    OB History    Grav Para Term Preterm Abortions TAB SAB Ect Mult Living                  Review of Systems  Constitutional: Negative for fever.   Respiratory: Negative for shortness of breath.   Cardiovascular: Negative for leg swelling and syncope.  Gastrointestinal: Negative for nausea and vomiting.  Neurological: Positive for dizziness and headaches.  All other systems reviewed and are negative.    Allergies  Ambien  Home Medications   Current Outpatient Rx  Name  Route  Sig  Dispense  Refill  . ALPRAZOLAM 0.5 MG PO TABS   Oral   Take 0.5 mg by mouth at bedtime as needed. For nerves         . ASPIRIN EC 81 MG PO TBEC   Oral   Take 81 mg by mouth every morning.         . BC HEADACHE POWDER PO   Oral   Take 1 packet by mouth daily as needed. For headache         . ATORVASTATIN CALCIUM 10 MG PO TABS   Oral   Take 10 mg by mouth 3 (three) times a week. At bedtime.  Monday, Wednesday, friday         . CLONAZEPAM 0.5 MG PO TABS   Oral   Take 0.5-1 mg by mouth at bedtime as needed. For nerves         . FLUOXETINE HCL 20 MG PO CAPS   Oral   Take 20 mg by mouth every morning.         [name] FLUTICASONE PROPIONATE 50 MCG/ACT NA SUSP  Nasal   Place 2 sprays into the nose daily as needed. For allergies         . HYDROCODONE-ACETAMINOPHEN 5-325 MG PO TABS   Oral   Take 1 tablet by mouth every 6 (six) hours as needed. For pain         . HYDROCODONE-ACETAMINOPHEN 5-500 MG PO TABS   Oral   Take 1 tablet by mouth every 4 (four) hours as needed. For pain         . LISINOPRIL 10 MG PO TABS   Oral   Take 10 mg by mouth every morning.         [name] PRESERVISION AREDS PO TABS   Oral   Take 1 tablet by mouth every morning.         [name] FISH OIL CONCENTRATE PO   Oral   Take 1 capsule by mouth 2 (two) times daily. 1750mg          . OMEPRAZOLE 20 MG PO CPDR   Oral   Take 20 mg by mouth every morning.         . TRAVOPROST 0.004 % OP SOLN   Both Eyes   Place 1 drop into both eyes at bedtime.         [name] VITAMIN B-12 1000 MCG PO TABS   Oral   Take 1,000 mcg by mouth every morning.            BP 123/50  Pulse 97  Temp 97.8 F (36.6 C) (Oral)  Resp 16  SpO2 97%  Physical Exam  Nursing note and vitals reviewed. Constitutional: She is oriented to person, place, and time. She appears well-developed and well-nourished. No distress.  HENT:  Head: Normocephalic and atraumatic.  Eyes: EOM are normal. Pupils are equal, round, and reactive to light.  Neck: Normal range of motion. Neck supple.  Cardiovascular: An irregularly irregular rhythm present. Bradycardia present.  Exam reveals no friction rub.   No murmur heard. Pulmonary/Chest: Effort normal and breath sounds normal. No respiratory distress. She has no wheezes. She has no rales.  Abdominal: Soft. She exhibits no distension. There is no tenderness. There is no rebound.  Musculoskeletal: Normal range of motion. She exhibits no edema.  Neurological: She is alert and oriented to person, place, and time. No cranial nerve deficit or sensory deficit. She exhibits normal muscle tone. Gait (ataxic, unable to stand on her feet steadily) abnormal. GCS eye subscore is 4. GCS verbal subscore is 5. GCS motor subscore is 6.  Skin: She is not diaphoretic.    ED Course  Procedures (including critical care time)  Labs Reviewed  CBC WITH DIFFERENTIAL - Abnormal; Notable for the following:    RBC 3.62 (*)     Hemoglobin 11.8 (*)     HCT 34.5 (*)     All other components within normal limits  BASIC METABOLIC PANEL - Abnormal; Notable for the following:    Glucose, Bld 110 (*)     GFR calc non Af Amer 57 (*)     GFR calc Af Amer 66 (*)     All other components within normal limits  URINALYSIS, ROUTINE W REFLEX MICROSCOPIC - Abnormal; Notable for the following:    Leukocytes, UA MODERATE (*)     All other components within normal limits  URINE MICROSCOPIC-ADD ON - Abnormal; Notable for the following:    Squamous Epithelial / LPF FEW (*)     All other components within normal limits  TROPONIN  I  URINE CULTURE   Dg Chest 2  View  09/10/2012  *RADIOLOGY REPORT*  Clinical Data: Dizziness.  CHEST - 2 VIEW  Comparison: 11/08/2007  Findings: Heart size and pulmonary vascularity are normal and the lungs are clear except for slight unchanged scarring at the left lung base.  No acute osseous abnormality.  Moderate arthritis of the right shoulder joint.  IMPRESSION: No acute abnormality.   Original Report Authenticated By: Francene Boyers, M.D.    Ct Head Wo Contrast  09/10/2012  *RADIOLOGY REPORT*  Clinical Data: Headache and dizziness; question of recent trauma  CT HEAD WITHOUT CONTRAST  Technique:  Contiguous axial images were obtained from the base of the skull through the vertex without contrast. Study was obtained within 24 hours of patient arrival at the emergency department.  Comparison: November 08, 2007  Findings:  There is mild diffuse atrophy.  There is no mass, hemorrhage, extra-axial fluid collection, or midline shift.  There is rather minimal periventricular small vessel disease in the centra semiovale bilaterally.  No acute infarct seen.  Gray-white compartments are otherwise normal.  Bony calvarium appears intact.  The mastoid air cells are clear. There is extensive consolidation in the right sphenoid sinus region.  IMPRESSION: Atrophy with minimal small vessel disease.  Right sphenoid sinus disease.  Intracranial mass, hemorrhage, or acute infarct.   Original Report Authenticated By: Bretta Bang, M.D.      1. Dizziness   2. Headache   3. UTI (lower urinary tract infection)      Date: 09/10/2012  Rate: 73  Rhythm: normal sinus rhythm  QRS Axis: normal  Intervals: normal  ST/T Wave abnormalities: nonspecific T wave changes  Conduction Disutrbances:none  Narrative Interpretation: Rate of 73, sinus rhythm with paired PVCs that have appearance of RBBB.  Old EKG Reviewed: none available    MDM   86F with hx of HTN, HLD presents with hx of dizziness. Sensation of spinning that begins when she gets up in  the morning. Patient has had this for 3 months, gradually worsening. PCP instructed to come to ED last night. Has had workup with PCP with MRI that showed some signs of aging, otherwise no stroke per her and husband. Has also seen ENT for this. On exam, normotensive, bradycardic. Irregularly irregular rhythm. Patient unable to stand squarely on her feet. She can pull up from sitting in the bed and stand, however once on her feet, she is very unsteady. She has a difficult time taking steps. Her coordination is normal in legs and arms. Will CT Head for her ataxia. Will also obtain EKG due to possible arrhythmia.  EKG with sinus rhythm. She has some PVCs and PACs that resemble right bundle branch block. Patient was evaluated by neurology after head CT returned as normal. Neurology feels like patient's blood pressure may be a bit too low and that she does get orthostatic as her heart rate increases when she stands. Irregular pulse, they are concerned that she might not be getting after blood pressures on the monitor. We will obtain manual orthostatics here. They also do not feel she is ataxic. Neuro feels like she is voluntarily unable to stand up straight. She had an MRI within the past month that was normal. I spoke with Medicine who evaluated patient in the ED. Dr. Jomarie Longs with internal medicine had a long discussion with the patient in the ED and feels that at this time an inpatient admission would not benefit her. They do not feel like  she has any inpatient criteria. They did advise her to stop her blood pressure medications as neuro had mentioned previously. They recommended followup with her PCP. Patient cough this plan. Labs show mild UTI, will treat with Keflex. Husband states she has been on multiple courses of antibiotics for a UTI. Discharged home in stable condition.       Elwin Mocha, MD 09/10/12 [phone]

## 2012-09-10 NOTE — Progress Notes (Signed)
Chief complaint: Dizziness x3 month  History: This is a very pleasant 78 year old female who presented to [facility] emergency department today for evaluation of headache and dizziness which she states has been present for approximately 3 months. A neurology consult was requested. She is accompanied by her husband and her son-in-law who both assist with the patient's history. In talking with the patient and her family it appears that her symptoms have been going on for at least a year. The patient has been evaluated by her primary care physician and is currently being follow by a neurologist. She has had an MRI and a CT scan in the past both of which reportedly showed nonspecific finding in keeping with the patient's age.  The patient's husband reports that the patient has been having memory problems and mild confusion for approximately 6-7 months. She apparently was getting home health physical therapy as well as being seen by a home health nurse and speech pathologist. The patient and her family report no progress with her therapies.  The patient states that she has a headache daily. She describes it as being a 5 on a 1-10 scale and occasionally an 8 on a 1-10 scale. She takes Vicodin and occasionally however there is no real relief from her symptoms until she lies down. The patient describes no associated symptoms other than feeling wobbly and unsteady on her feet. Apparently the unsteadiness has been going on for at least a year. The patient states that she usually starts to fall backwards. She did fall backwards in her yard several months ago hitting her head but she denies these significant injury. She has a walker as well as a cane at home however she usually does not use an assistive device unless going out of the house with her husband. She no longer drives.  The patient and her husband both feel that her symptoms are gradually getting worse. Yesterday was a particularly bad day. Apparently  they contacted her primary care physician who recommended that they go to the emergency department. Since it was early evening at that time the patient and her husband decided to wait until today to come in for further evaluation. Apparently a psychiatric evaluation was recommended in the past however there was some difficulty with scheduling an appointment in the family did not followup.  Past medical history: As noted the patient has a long history of dizziness associated with an unsteady gait. She has hypertension, mild renal insufficiency, hyperlipidemia, daily headaches, macular degeneration, and she is blind in her right eye. She denies any cardiac history and states that she did have a cardiac evaluation at some point in the past. She has a history of adenocarcinoma of the uterus.  Surgical history: The patient is status post cholecystectomy appendectomy and hysterectomy  Allergies: Ambien  Medications:  Aspirin 81 mg daily Clonopin 0.5 mg Xanax 0.5 mg Lipitor 10 mg Prozac 20 mg Flonase 50 mcg. Vicodin when necessary Sister with 10 mg Multivitamin Prilosec 20 mg Omega 3 fatty acids B12 Travatan  Social history: The patient lives in [place] with her husband. They have 3 grown daughters. The patient states that she never smoked or use alcohol.  Family history: The patient's mother died in her 53s from heart problems her father died in his 41s from leukemia.    Past Medical History  Diagnosis Date  . Cancer     bladder    Past Surgical History  Procedure Date  . Cholecystectomy   . Appendectomy  Allergies  Allergen Reactions  . Ambien (Zolpidem Tartrate)     "hallucination"    ROS  General ROS: negative for - chills, fatigue, fever, night sweats, weight gain or weight loss Psychological ROS: negative for - behavioral disorder, hallucinations, memory difficulties, mood swings or suicidal ideation Ophthalmic ROS: negative for - blurry vision, double  vision, eye pain, Blind OD, Macular degeneration ENT ROS: negative for - epistaxis, nasal discharge, oral lesions, sore throat, tinnitus or vertigo, Small ulcerative sore right nostril. Allergy and Immunology ROS: negative for - hives or itchy/watery eyes Hematological and Lymphatic ROS: negative for - bleeding problems, bruising or swollen lymph nodes Endocrine ROS: negative for - galactorrhea, hair pattern changes, polydipsia/polyuria or temperature intolerance Respiratory ROS: negative for - cough, hemoptysis, shortness of breath or wheezing Cardiovascular ROS: negative for - chest pain, dyspnea on exertion, edema or irregular heartbeat, occasional ectopic beats. Gastrointestinal ROS: negative for - abdominal pain, diarrhea, hematemesis, nausea/vomiting or stool incontinence Genito-Urinary ROS: negative for - dysuria, hematuria, incontinence or urinary frequency/urgency Musculoskeletal ROS: negative for - joint swelling or muscular weakness Neurological ROS: as noted in HPI Dermatological ROS: negative for rash and skin lesion changes  Physical Examination: Blood pressure 123/50, pulse 97, temperature 97.8 F (36.6 C), temperature source Oral, resp. rate 16, SpO2 97.00%.  Neurologic Examination This is a pleasant alert 78 year old white female in no acute distress. Cranial nerves II through XII are grossly intact. She is blind in her right eye. Extraocular movements are normal with no nystagmus.. There is no facial asymmetry. Muscle testing-motor strength was graded at 4-5 over 5 in all extremities. There was no focal weakness. Sensation-sensation was intact to light touch Cerebellar testing-finger to nose and heel to shin tests were normal Deep tendon reflexes-reflexes are rated at 82 throughout there was no Babinski Standing-the patient was able to stand with some assistance. She did not appear to be dizzy although she had some abnormal movements which at times seem  deliberate.  Laboratory Studies:   Basic Metabolic Panel:  Lab 09/10/12 1610  NA 140  K 4.3  CL 105  CO2 27  GLUCOSE 110*  BUN 19  CREATININE 0.90  CALCIUM 9.5  MG --  PHOS --    Liver Function Tests: No results found for this basename: AST:5,ALT:5,ALKPHOS:5,BILITOT:5,PROT:5,ALBUMIN:5 in the last 168 hours No results found for this basename: LIPASE:5,AMYLASE:5 in the last 168 hours No results found for this basename: AMMONIA:3 in the last 168 hours  CBC:  Lab 09/10/12 0947  WBC 6.7  NEUTROABS 4.3  HGB 11.8*  HCT 34.5*  MCV 95.3  PLT 292    Cardiac Enzymes:  Lab 09/10/12 0947  CKTOTAL --  CKMB --  CKMBINDEX --  TROPONINI <0.30    BNP: No components found with this basename: POCBNP:5  CBG: No results found for this basename: GLUCAP:5 in the last 168 hours  Microbiology: No results found for this or any previous visit.  Coagulation Studies: No results found for this basename: LABPROT:5,INR:5 in the last 72 hours  Urinalysis:  Lab 09/10/12 1015  COLORURINE YELLOW  LABSPEC 1.010  PHURINE 5.0  GLUCOSEU NEGATIVE  HGBUR NEGATIVE  BILIRUBINUR NEGATIVE  KETONESUR NEGATIVE  PROTEINUR NEGATIVE  UROBILINOGEN 0.2  NITRITE NEGATIVE  LEUKOCYTESUR MODERATE*    Lipid Panel:  No results found for this basename: chol, trig, hdl, cholhdl, vldl, ldlcalc    HgbA1C:  No results found for this basename: HGBA1C    Urine Drug Screen:   No results found for this  basename: labopia, cocainscrnur, labbenz, amphetmu, thcu, labbarb    Alcohol Level: No results found for this basename: ETH:2 in the last 168 hours  Imaging: Dg Chest 2 View  09/10/2012  *RADIOLOGY REPORT*  Clinical Data: Dizziness.  CHEST - 2 VIEW  Comparison: 11/08/2007  Findings: Heart size and pulmonary vascularity are normal and the lungs are clear except for slight unchanged scarring at the left lung base.  No acute osseous abnormality.  Moderate arthritis of the right shoulder joint.   IMPRESSION: No acute abnormality.   Original Report Authenticated By: Francene Boyers, M.D.    Ct Head Wo Contrast  09/10/2012  *RADIOLOGY REPORT*  Clinical Data: Headache and dizziness; question of recent trauma  CT HEAD WITHOUT CONTRAST  Technique:  Contiguous axial images were obtained from the base of the skull through the vertex without contrast. Study was obtained within 24 hours of patient arrival at the emergency department.  Comparison: November 08, 2007  Findings:  There is mild diffuse atrophy.  There is no mass, hemorrhage, extra-axial fluid collection, or midline shift.  There is rather minimal periventricular small vessel disease in the centra semiovale bilaterally.  No acute infarct seen.  Gray-white compartments are otherwise normal.  Bony calvarium appears intact.  The mastoid air cells are clear. There is extensive consolidation in the right sphenoid sinus region.  IMPRESSION: Atrophy with minimal small vessel disease.  Right sphenoid sinus disease.  Intracranial mass, hemorrhage, or acute infarct.   Original Report Authenticated By: Bretta Bang, M.D.      Assessment/Plan: 78 year old female with a history of headache and dizziness that has been present for greater than 3 months in varying degrees of severity.  Feels her symptoms are worsening.  Has seen a neurologist on an outpatient basis and a MRI of the brain was performed that per family report was unremarkable.  Presents for further recommendations and management.  On review of her vitals the patient has an irregular heart rate.  Has quite a low BP as well for her age and describes symptoms that are fairly orthostatic.  Her symptoms may be related to such.  With her persistent headache must also consider a chronic daily headache scenario as well.   She has had the life threatening etiologies addressed by her outpatient neurologist, such as stroke, tumor or hemorrhage.  Exam is nonfocal and the symptoms she exhibits on standing are  quite unusual.    Recommendations: 1.  Cardiac rhythm to be evaluated. 2.  Orthostatic BP with heart rate 3.  Liberalization of BP.  Patient reports that her physician told her to only take her antihypertensives on a prn basis but her husband fills her pill box and she is taking them daily.   4.  If above measures are not helpful patient may follow up with her neurologist on an outpatient basis who may recommend a headache prophylactic therapy.   5.  Discontinuation of narcotics.  6.  Review need for both Xanax and Klonopin at night  Case discussed with Dr. Carver Fila, MD [facility] [phone] 09/10/2012  1:26 PM

## 2012-09-10 NOTE — Consult Note (Signed)
Pt seen after EDP consulted for DIzziness  PMH of HTN, dyslipidemia, presents to ER with with chronic dizziness for >3 months, husband thinks it really started a year ago Worse with position change Has been followed by PCP, Dr.Yan Neurology, and also seen ENT for this, had MRI last month normal, has been getting Home PT and vestibular PT at home for this. GEN: AAOx3, no distress HEENT: PERRLA, EOMI, no JVD CVS: S1S2/RRR, no mr//g Lungs: CTAB Abd: soft,NT,Bs present Ext: no edema/c/ Neuro: no localising signs On exam: vitals stable, not orthostatic, BP 123/50, no arrhythmias on monitor while in ER EKG with 2 PVCs, otherwise unremarkable I made patient ambulate, she was able to ambulate by herself with me standing beside her, and occasionally holding her hand. Also seen by NEuro Discussed situation with pt and husband No change in chronic dizziness, meclizine tried last month didn't help much,  Advised to use caution with position change, continue vestibular PT , use walker at all times And asked to FU with PCP/Neuro/ENT Also advised to stop lisinopril, and cut down narcotics and use either xanax or klonopin for sleep but not both EDP notified of this plan  Zannie Cove, MD [phone]

## 2012-09-10 NOTE — ED Provider Notes (Signed)
I saw and evaluated the patient, reviewed the resident's note and I agree with the findings and plan.  Pt comes in with cc of dizziness. Neuro was consulted - negative for any workup from their end. Pt was unstable, so medicine was consulted for admission, but they too think patient is stable for discharge, and had  Zettie Cooley discussion with the patient. Fall precautions discussed.  Derwood Kaplan, MD 09/10/12 1546

## 2012-09-10 NOTE — ED Notes (Signed)
Pt reports dizziness for last three months. States constantly dizzy as if room is spinning and she is unsteady on her feet. Also reports daily headaches, took vicodin yesterday without relief. States had MRI last month which was normal. Grips equal, no facial droop, no arm drift.

## 2012-09-11 DIAGNOSIS — M5030 Other cervical disc degeneration, unspecified cervical region: Secondary | ICD-10-CM | POA: Diagnosis not present

## 2012-09-11 DIAGNOSIS — M19019 Primary osteoarthritis, unspecified shoulder: Secondary | ICD-10-CM | POA: Diagnosis not present

## 2012-09-11 LAB — URINE CULTURE

## 2012-09-17 DIAGNOSIS — I10 Essential (primary) hypertension: Secondary | ICD-10-CM | POA: Diagnosis not present

## 2012-10-07 ENCOUNTER — Encounter: Payer: Self-pay | Admitting: Otolaryngology

## 2012-11-09 DIAGNOSIS — R269 Unspecified abnormalities of gait and mobility: Secondary | ICD-10-CM | POA: Diagnosis not present

## 2012-11-09 DIAGNOSIS — IMO0002 Reserved for concepts with insufficient information to code with codable children: Secondary | ICD-10-CM | POA: Diagnosis not present

## 2012-11-09 DIAGNOSIS — R51 Headache: Secondary | ICD-10-CM | POA: Diagnosis not present

## 2012-11-09 DIAGNOSIS — M9981 Other biomechanical lesions of cervical region: Secondary | ICD-10-CM | POA: Diagnosis not present

## 2012-11-09 DIAGNOSIS — R42 Dizziness and giddiness: Secondary | ICD-10-CM | POA: Diagnosis not present

## 2012-11-09 DIAGNOSIS — E785 Hyperlipidemia, unspecified: Secondary | ICD-10-CM | POA: Diagnosis not present

## 2012-11-09 DIAGNOSIS — N9089 Other specified noninflammatory disorders of vulva and perineum: Secondary | ICD-10-CM | POA: Diagnosis not present

## 2012-11-09 DIAGNOSIS — M999 Biomechanical lesion, unspecified: Secondary | ICD-10-CM | POA: Diagnosis not present

## 2012-11-09 DIAGNOSIS — R350 Frequency of micturition: Secondary | ICD-10-CM | POA: Diagnosis not present

## 2012-11-09 DIAGNOSIS — M5030 Other cervical disc degeneration, unspecified cervical region: Secondary | ICD-10-CM | POA: Diagnosis not present

## 2012-11-21 DIAGNOSIS — M19019 Primary osteoarthritis, unspecified shoulder: Secondary | ICD-10-CM | POA: Diagnosis not present

## 2012-11-21 DIAGNOSIS — M542 Cervicalgia: Secondary | ICD-10-CM | POA: Diagnosis not present

## 2012-12-14 DIAGNOSIS — M19011 Primary osteoarthritis, right shoulder: Secondary | ICD-10-CM | POA: Insufficient documentation

## 2012-12-14 DIAGNOSIS — F341 Dysthymic disorder: Secondary | ICD-10-CM | POA: Diagnosis not present

## 2012-12-14 DIAGNOSIS — R5381 Other malaise: Secondary | ICD-10-CM | POA: Diagnosis not present

## 2012-12-14 DIAGNOSIS — F418 Other specified anxiety disorders: Secondary | ICD-10-CM | POA: Insufficient documentation

## 2012-12-14 DIAGNOSIS — M1990 Unspecified osteoarthritis, unspecified site: Secondary | ICD-10-CM | POA: Diagnosis not present

## 2013-01-04 DIAGNOSIS — N8111 Cystocele, midline: Secondary | ICD-10-CM | POA: Diagnosis not present

## 2013-01-04 DIAGNOSIS — R300 Dysuria: Secondary | ICD-10-CM | POA: Diagnosis not present

## 2013-01-04 DIAGNOSIS — N952 Postmenopausal atrophic vaginitis: Secondary | ICD-10-CM | POA: Diagnosis not present

## 2013-01-04 DIAGNOSIS — N390 Urinary tract infection, site not specified: Secondary | ICD-10-CM | POA: Diagnosis not present

## 2013-01-05 DIAGNOSIS — M25569 Pain in unspecified knee: Secondary | ICD-10-CM | POA: Diagnosis not present

## 2013-01-05 DIAGNOSIS — M1710 Unilateral primary osteoarthritis, unspecified knee: Secondary | ICD-10-CM | POA: Diagnosis not present

## 2013-01-05 DIAGNOSIS — M76899 Other specified enthesopathies of unspecified lower limb, excluding foot: Secondary | ICD-10-CM | POA: Diagnosis not present

## 2013-01-05 DIAGNOSIS — M25559 Pain in unspecified hip: Secondary | ICD-10-CM | POA: Diagnosis not present

## 2013-01-14 ENCOUNTER — Telehealth: Payer: Self-pay | Admitting: *Deleted

## 2013-01-14 NOTE — Telephone Encounter (Signed)
Called pharmacy to let them know that patient has not been seen at this office.

## 2013-01-14 NOTE — Telephone Encounter (Signed)
Refill Request  Lisinopril 10 mg tab  #90  Take one tablet each day

## 2013-01-14 NOTE — Telephone Encounter (Signed)
If she does not have an appt and has not been seen then need to tell pharmacy - to refer to [facility] for refill or her primary md currently seeing.

## 2013-01-14 NOTE — Telephone Encounter (Signed)
Please advise....Carolyn KitchenMarland KitchenPatient is requesting medication refill, she has not been seen in the office and she does not have a upcoming appointment.

## 2013-01-15 DIAGNOSIS — H4010X0 Unspecified open-angle glaucoma, stage unspecified: Secondary | ICD-10-CM | POA: Diagnosis not present

## 2013-01-18 ENCOUNTER — Ambulatory Visit: Payer: Self-pay

## 2013-01-18 DIAGNOSIS — Q619 Cystic kidney disease, unspecified: Secondary | ICD-10-CM | POA: Diagnosis not present

## 2013-01-18 DIAGNOSIS — R109 Unspecified abdominal pain: Secondary | ICD-10-CM | POA: Diagnosis not present

## 2013-01-18 DIAGNOSIS — M1710 Unilateral primary osteoarthritis, unspecified knee: Secondary | ICD-10-CM | POA: Diagnosis not present

## 2013-01-22 ENCOUNTER — Encounter: Payer: Self-pay | Admitting: Internal Medicine

## 2013-01-25 ENCOUNTER — Encounter: Payer: Self-pay | Admitting: Internal Medicine

## 2013-01-25 ENCOUNTER — Ambulatory Visit (INDEPENDENT_AMBULATORY_CARE_PROVIDER_SITE_OTHER): Payer: Medicare Other | Admitting: Internal Medicine

## 2013-01-25 VITALS — BP 130/78 | HR 94 | Temp 98.3°F | Resp 18 | Ht 62.0 in | Wt 153.5 lb

## 2013-01-25 DIAGNOSIS — I272 Pulmonary hypertension, unspecified: Secondary | ICD-10-CM

## 2013-01-25 DIAGNOSIS — F329 Major depressive disorder, single episode, unspecified: Secondary | ICD-10-CM

## 2013-01-25 DIAGNOSIS — G479 Sleep disorder, unspecified: Secondary | ICD-10-CM

## 2013-01-25 DIAGNOSIS — E7800 Pure hypercholesterolemia, unspecified: Secondary | ICD-10-CM

## 2013-01-25 DIAGNOSIS — I2789 Other specified pulmonary heart diseases: Secondary | ICD-10-CM

## 2013-01-25 DIAGNOSIS — Z87440 Personal history of urinary (tract) infections: Secondary | ICD-10-CM

## 2013-01-25 DIAGNOSIS — I10 Essential (primary) hypertension: Secondary | ICD-10-CM | POA: Diagnosis not present

## 2013-01-25 DIAGNOSIS — R42 Dizziness and giddiness: Secondary | ICD-10-CM

## 2013-01-28 DIAGNOSIS — H26499 Other secondary cataract, unspecified eye: Secondary | ICD-10-CM | POA: Diagnosis not present

## 2013-02-01 ENCOUNTER — Encounter: Payer: Self-pay | Admitting: Internal Medicine

## 2013-02-01 DIAGNOSIS — F329 Major depressive disorder, single episode, unspecified: Secondary | ICD-10-CM | POA: Insufficient documentation

## 2013-02-01 DIAGNOSIS — E7800 Pure hypercholesterolemia, unspecified: Secondary | ICD-10-CM | POA: Insufficient documentation

## 2013-02-01 DIAGNOSIS — Z87440 Personal history of urinary (tract) infections: Secondary | ICD-10-CM | POA: Insufficient documentation

## 2013-02-01 DIAGNOSIS — G479 Sleep disorder, unspecified: Secondary | ICD-10-CM | POA: Insufficient documentation

## 2013-02-01 DIAGNOSIS — I2729 Other secondary pulmonary hypertension: Secondary | ICD-10-CM | POA: Insufficient documentation

## 2013-02-01 DIAGNOSIS — I272 Pulmonary hypertension, unspecified: Secondary | ICD-10-CM | POA: Insufficient documentation

## 2013-02-01 NOTE — Assessment & Plan Note (Signed)
Has seen Dr  Lady Gary and Dr Meredeth Ide.  Dr Meredeth Ide was to follow up with a follow up ECHO.  Obtain results.

## 2013-02-01 NOTE — Assessment & Plan Note (Signed)
Low cholesterol diet.  Continue current medication regimen.  Review outside labs.  Follow lipid panel and liver function.    

## 2013-02-01 NOTE — Assessment & Plan Note (Signed)
Discussed behavior modification.  She is sleeping during the day.  Try to shift sleeping pattern.  Hold on medication.  Follow.

## 2013-02-01 NOTE — Assessment & Plan Note (Signed)
Blood pressure doing well on no medication.  Follow.   

## 2013-02-01 NOTE — Assessment & Plan Note (Signed)
Since coming off of some of the medication, has improved.  Follow.

## 2013-02-01 NOTE — Progress Notes (Signed)
Subjective:    Patient ID: [name], female    DOB: 22-Sep-1926, 79 y.o.   MRN: [id number]  HPI 79 year old female with past history of uterine and vulvar cancer, hypertension and hypercholesterolemia who comes in today for a scheduled follow up.  I saw her at [place].  She is accompanied by her daughter.  History obtained from both of them.  Has been having issues with increased depression and increased stress.  Recently was evaluated by a geriatrician (Dr Cliffton Asters - [facility] Mc).  She started her on Effexor.  Has only been on this for a short period of time.  She has been dealing with a lot of changes and stressors.  Her dog died.  The lady that she had been sitting with for years also passed away.  Had some issues with falling.  Walking with a cane/walker now.  Still with some unsteadiness.  Needs a knee replacement.   Sees Dr Yisroel Ramming.  Was evaluated at [facility].  Had MRI.  Stated was ok.  Had some "age related changes".  Seeing Michiel Cowboy for reoccurring uti.  Bladder has dropped.  Gave her some estrogen cream.  Is due to see Dr Inez Pilgrim soon.  Planning for eye surgery.  Blood pressure has been doing well on no medications.  No chest pain or tightness.  Breathing stable.  Daughter reports that [name] is doing better since coming off of some of her medications.     Past Medical History  Diagnosis Date  . Cancer     uterine and questionable vulvar, s/p hysterectomy  . Hypercholesterolemia   . Chronic headaches   . GERD (gastroesophageal reflux disease)   . Glaucoma   . Arthritis   . Right bundle branch block   . Reactive airway disease   . Cystocele   . Rectocele   . Hypertension     Current Outpatient Prescriptions on File Prior to Visit  Medication Sig Dispense Refill  . aspirin EC 81 MG tablet Take 81 mg by mouth every morning.      [name] atorvastatin (LIPITOR) 10 MG tablet Take 10 mg by mouth 3 (three) times a week. At bedtime.  Monday, Wednesday, friday      .  Multiple Vitamins-Minerals (PRESERVISION AREDS) TABS Take 1 tablet by mouth every morning.      . Omega-3 Fatty Acids (FISH OIL CONCENTRATE PO) Take 1 capsule by mouth 2 (two) times daily. 1750mg       . omeprazole (PRILOSEC) 20 MG capsule Take 20 mg by mouth every morning.      . travoprost, benzalkonium, (TRAVATAN) 0.004 % ophthalmic solution Place 1 drop into both eyes at bedtime.       No current facility-administered medications on file prior to visit.    Review of Systems Patient denies any significant headache, lightheadedness or dizziness. No significant allergy or sinus symptoms.  No chest pain, tightness or palpitations.  No increased shortness of breath, cough or congestion.  No nausea or vomiting.  No abdominal pain or cramping.  No bowel change, such as diarrhea, constipation, BRBPR or melana.  Has been having reoccurring urinary tract infections.  Following with urology.  Knee pain.  Sees Dr Yisroel Ramming.  Knees surgery.  Using a cane or walker now. Doing better since coming off of some of the medication.  Sleeping is still an issue for her.  Sleeps during the day.  Discussed the need to shift her sleep schedule.  Objective:   Physical Exam Filed Vitals:   01/25/13 1558  BP: 130/78  Pulse: 94  Temp: 98.3 F (36.8 C)  Resp: 73   79 year old female in no acute distress.   HEENT:  Nares- clear.  Oropharynx - without lesions. NECK:  Supple.  Nontender.  No audible bruit.  HEART:  Appears to be regular. LUNGS:  No crackles or wheezing audible.  Respirations even and unlabored.  RADIAL PULSE:  Equal bilaterally.   ABDOMEN:  Soft, nontender.  Bowel sounds present and normal.  No audible abdominal bruit.   EXTREMITIES:  No increased edema present.  DP pulses palpable and equal bilaterally.          Assessment & Plan:  GI.  Colonoscopy 03/05/05 with diverticulosis.  Currently doing well.   CARDIOVASCULAR.  Stress test 12/18/10 revealed no ischemia.  [facility] 12/18/10 revealed EF 60%  with mild RV and RA enlargement, mild LVH, mild mitral and tricuspid insufficiency with documented mild pulmonary hypertension.  Saw Dr Lady Gary.  Felt no further w/up warranted.  KNEE PAIN.  Seeing Dr Yisroel Ramming.  Needs knee surgery.      HEALTH MAINTENANCE.  Up to date with her physicals.  Need to obtain records.

## 2013-02-01 NOTE — Assessment & Plan Note (Signed)
On Effexor.  Follow.

## 2013-02-01 NOTE — Assessment & Plan Note (Signed)
Seeing urology

## 2013-02-02 DIAGNOSIS — N289 Disorder of kidney and ureter, unspecified: Secondary | ICD-10-CM | POA: Diagnosis not present

## 2013-02-02 DIAGNOSIS — Q619 Cystic kidney disease, unspecified: Secondary | ICD-10-CM | POA: Diagnosis not present

## 2013-02-02 DIAGNOSIS — R350 Frequency of micturition: Secondary | ICD-10-CM | POA: Diagnosis not present

## 2013-02-12 ENCOUNTER — Ambulatory Visit: Payer: Self-pay

## 2013-02-12 DIAGNOSIS — Q619 Cystic kidney disease, unspecified: Secondary | ICD-10-CM | POA: Diagnosis not present

## 2013-02-13 ENCOUNTER — Telehealth: Payer: Self-pay | Admitting: Internal Medicine

## 2013-02-13 NOTE — Telephone Encounter (Signed)
Pt left without scheduling a follow up appt.  She needs a follow up appt around the end of June.  Needs to be appt or end of 1/2 day.  Thanks.

## 2013-02-15 DIAGNOSIS — IMO0002 Reserved for concepts with insufficient information to code with codable children: Secondary | ICD-10-CM | POA: Diagnosis not present

## 2013-02-15 DIAGNOSIS — M999 Biomechanical lesion, unspecified: Secondary | ICD-10-CM | POA: Diagnosis not present

## 2013-02-15 NOTE — Telephone Encounter (Signed)
Appointment 04/06/13 @ 2:30  Pt aware of appointment

## 2013-02-18 DIAGNOSIS — Q619 Cystic kidney disease, unspecified: Secondary | ICD-10-CM | POA: Diagnosis not present

## 2013-02-18 DIAGNOSIS — R350 Frequency of micturition: Secondary | ICD-10-CM | POA: Diagnosis not present

## 2013-02-26 ENCOUNTER — Ambulatory Visit (INDEPENDENT_AMBULATORY_CARE_PROVIDER_SITE_OTHER): Payer: Medicare Other | Admitting: Internal Medicine

## 2013-02-26 ENCOUNTER — Encounter: Payer: Self-pay | Admitting: Internal Medicine

## 2013-02-26 VITALS — BP 130/90 | HR 102 | Temp 98.4°F | Ht 62.0 in | Wt 156.0 lb

## 2013-02-26 DIAGNOSIS — R51 Headache: Secondary | ICD-10-CM | POA: Diagnosis not present

## 2013-02-26 DIAGNOSIS — E7800 Pure hypercholesterolemia, unspecified: Secondary | ICD-10-CM | POA: Diagnosis not present

## 2013-02-26 DIAGNOSIS — F329 Major depressive disorder, single episode, unspecified: Secondary | ICD-10-CM | POA: Diagnosis not present

## 2013-02-26 DIAGNOSIS — R42 Dizziness and giddiness: Secondary | ICD-10-CM | POA: Diagnosis not present

## 2013-02-26 DIAGNOSIS — G479 Sleep disorder, unspecified: Secondary | ICD-10-CM

## 2013-02-26 DIAGNOSIS — I10 Essential (primary) hypertension: Secondary | ICD-10-CM

## 2013-02-26 DIAGNOSIS — I272 Pulmonary hypertension, unspecified: Secondary | ICD-10-CM

## 2013-02-26 DIAGNOSIS — Z87440 Personal history of urinary (tract) infections: Secondary | ICD-10-CM

## 2013-02-26 DIAGNOSIS — I2789 Other specified pulmonary heart diseases: Secondary | ICD-10-CM

## 2013-02-26 LAB — CBC WITH DIFFERENTIAL/PLATELET
Basophils Relative: 0.6 % (ref 0.0–3.0)
Eosinophils Absolute: 0.1 10*3/uL (ref 0.0–0.7)
HCT: 40.6 % (ref 36.0–46.0)
Hemoglobin: 13.6 g/dL (ref 12.0–15.0)
MCHC: 33.7 g/dL (ref 30.0–36.0)
MCV: 96.1 fl (ref 78.0–100.0)
Monocytes Absolute: 0.5 10*3/uL (ref 0.1–1.0)
Neutro Abs: 4.9 10*3/uL (ref 1.4–7.7)
RBC: 4.22 Mil/uL (ref 3.87–5.11)

## 2013-02-26 LAB — HEPATIC FUNCTION PANEL
Albumin: 3.8 g/dL (ref 3.5–5.2)
Alkaline Phosphatase: 111 U/L (ref 39–117)
Total Protein: 6.9 g/dL (ref 6.0–8.3)

## 2013-02-26 LAB — BASIC METABOLIC PANEL
Chloride: 105 mEq/L (ref 96–112)
Creatinine, Ser: 0.8 mg/dL (ref 0.4–1.2)

## 2013-02-26 LAB — SEDIMENTATION RATE: Sed Rate: 39 mm/hr — ABNORMAL HIGH (ref 0–22)

## 2013-02-26 LAB — TSH: TSH: 1.31 u[IU]/mL (ref 0.35–5.50)

## 2013-02-26 MED ORDER — TRAZODONE HCL 50 MG PO TABS: 25.0000 mg | ORAL_TABLET | Freq: Every evening | ORAL | Status: DC | PRN

## 2013-02-28 ENCOUNTER — Encounter: Payer: Self-pay | Admitting: Internal Medicine

## 2013-02-28 NOTE — Progress Notes (Signed)
Subjective:    Patient ID: REMIE MATHISON, female    DOB: [birth year], 79 y.o.   MRN: [id number]  HPI 79 year old female with past history of uterine and vulvar cancer, hypertension and hypercholesterolemia who comes in today for a scheduled follow up.  She is accompanied by her daughter.  History obtained from both of them.  Has been having issues with increased depression and increased stress.  Recently was evaluated by a geriatrician (Dr Cliffton Asters - [facility] Mc).  She started her on Effexor.  She has been dealing with a lot of changes and stressors.  Her dog died.  The lady that she had been sitting with for years also passed away.  Has had some issues with falling.  Walking with a cane/walker now.  Still with some unsteadiness.  Fell last night.  Tripped coming inside.  Fell on her right side.  Did not hit her head this time.  Did fall in 8/13.  Fell backwards.  Hit her head.  Reports persistent dizziness/light headedness and headache.  Does report was going on before she fell.  Has been evaluated multiple times since this fall.  Was evaluated at [facility].  Had MRI.  Stated was ok.  Had some "age related changes".   The headache, she describes as being located in the frontal region and extending up to the top of her head.  More on the right side.  Has some pain in her neck and shoulders.   Seeing Michiel Cowboy for reoccurring uti.  Bladder has dropped.  Gave her some estrogen cream.  Had f/u CT scan recently and was told she had a benign cyst.  Need to obtain records.   Blood pressure has been doing well on no medications.  No chest pain or tightness.  Breathing stable.  Not sleeping.  Needs something to help her sleep.     Past Medical History  Diagnosis Date  . Cancer     uterine and questionable vulvar, s/p hysterectomy  . Hypercholesterolemia   . Chronic headaches   . GERD (gastroesophageal reflux disease)   . Glaucoma   . Arthritis   . Right bundle branch block   . Reactive airway  disease   . Cystocele   . Rectocele   . Hypertension     Current Outpatient Prescriptions on File Prior to Visit  Medication Sig Dispense Refill  . aspirin EC 81 MG tablet Take 81 mg by mouth every morning.      [name] atorvastatin (LIPITOR) 10 MG tablet Take 10 mg by mouth 3 (three) times a week. At bedtime.  Monday, Wednesday, friday      . Multiple Vitamins-Minerals (PRESERVISION AREDS) TABS Take 1 tablet by mouth every morning.      . Omega-3 Fatty Acids (FISH OIL CONCENTRATE PO) Take 1 capsule by mouth 2 (two) times daily. 1750mg       . omeprazole (PRILOSEC) 20 MG capsule Take 20 mg by mouth every morning.      . travoprost, benzalkonium, (TRAVATAN) 0.004 % ophthalmic solution Place 1 drop into both eyes at bedtime.      [name] venlafaxine XR (EFFEXOR-XR) 37.5 MG 24 hr capsule One in the am and two q pm       No current facility-administered medications on file prior to visit.    Review of Systems Patient reports main complaints are the headache and not sleeping.  See above.  Has been evaluated by multiple physicians.  Negative MRI.  Had her eyes checked.  No significant allergy or sinus symptoms.  No chest pain, tightness or palpitations.  No increased shortness of breath, cough or congestion.  No nausea or vomiting.  No abdominal pain or cramping.  No bowel change, such as diarrhea, constipation, BRBPR or melana.  Has been having reoccurring urinary tract infections.  Following with urology.  Had CT.  Benign cyst.  Knee pain.  Sees Dr Yisroel Ramming.  Needs  surgery.  Using a cane or walker now.  Sleeping is still an issue for her.       Objective:   Physical Exam  Filed Vitals:   02/26/13 1142  BP: 130/90  Pulse: 102  Temp: 98.4 F (36.9 C)   Blood pressure recheck:  134/82, pulse 64  79 year old female in no acute distress.   HEENT:  Nares- clear.  Oropharynx - without lesions.  No significant tenderness to palpation over the sinus/temporal region. NECK:  Supple.  Nontender.  No audible  bruit.  HEART:  Appears to be regular. LUNGS:  No crackles or wheezing audible.  Respirations even and unlabored.  RADIAL PULSE:  Equal bilaterally.   ABDOMEN:  Soft, nontender.  Bowel sounds present and normal.  No audible abdominal bruit.   EXTREMITIES:  No increased edema present.  DP pulses palpable and equal bilaterally.          Assessment & Plan:  GI.  Colonoscopy 03/05/05 with diverticulosis.  Currently doing well.   CARDIOVASCULAR.  Stress test 12/18/10 revealed no ischemia.  [facility] 12/18/10 revealed EF 60% with mild RV and RA enlargement, mild LVH, mild mitral and tricuspid insufficiency with documented mild pulmonary hypertension.  Saw Dr Lady Gary.  Felt no further w/up warranted.  KNEE PAIN.  Seeing Dr Yisroel Ramming.  Needs knee surgery.      HEALTH MAINTENANCE.  Up to date with her physicals.

## 2013-02-28 NOTE — Assessment & Plan Note (Signed)
Start trazodone as outlined.  Has taken previously and tolerated.  Follow.

## 2013-02-28 NOTE — Assessment & Plan Note (Signed)
Persistent.  Initially daughter reported that the headache had been present since her fall in 8/13.  [name] reports was present prior to the fall.  Has been persistent.  Has seen neurology.  Had negative mri (per report).  Not sleeping.  Will start trazodone as outlined.  Schedule a f/u appt with neurology.  She wants to see someone in town.  Check ESR.

## 2013-02-28 NOTE — Assessment & Plan Note (Signed)
Low cholesterol diet.  Continue current medication regimen.  Review outside labs.  Follow lipid panel and liver function.

## 2013-02-28 NOTE — Assessment & Plan Note (Signed)
Blood pressure doing well on no medication.  Follow.   

## 2013-02-28 NOTE — Assessment & Plan Note (Signed)
On Effexor.  Not sleeping.  Previously had been on Zoloft.  Persistent issues.  Will refer to psych for evaluation and treatment recommendations.  Pt and daughter comfortable with this plan.

## 2013-02-28 NOTE — Assessment & Plan Note (Signed)
Has seen Dr  Lady Gary and Dr Meredeth Ide.  Dr Meredeth Ide was to follow up with a follow up ECHO.  Need results.

## 2013-02-28 NOTE — Assessment & Plan Note (Signed)
Since coming off of some of the medication, daughter had reported (last visit) had improved.  Still persistent.  Still with headache as well.  Had mri.  Reported negative.  Feel needs neurology f/u.  Request to see a neurologist in town.  Will schedule an appt with Dr Sherryll Burger.  Check ESR.  Start trazodone to help her sleep.  Has taken previously and tolerated.

## 2013-02-28 NOTE — Assessment & Plan Note (Signed)
Seeing urology.  Had CT.  Benign cyst.  Obtain records.

## 2013-03-03 IMAGING — CR DG CHEST 2V
2 series · 2 of 2 positions shown · non-contrast
Comparison: 11/08/2007

CLINICAL DATA: Dizziness.

CHEST - 2 VIEW

[x chest ap]
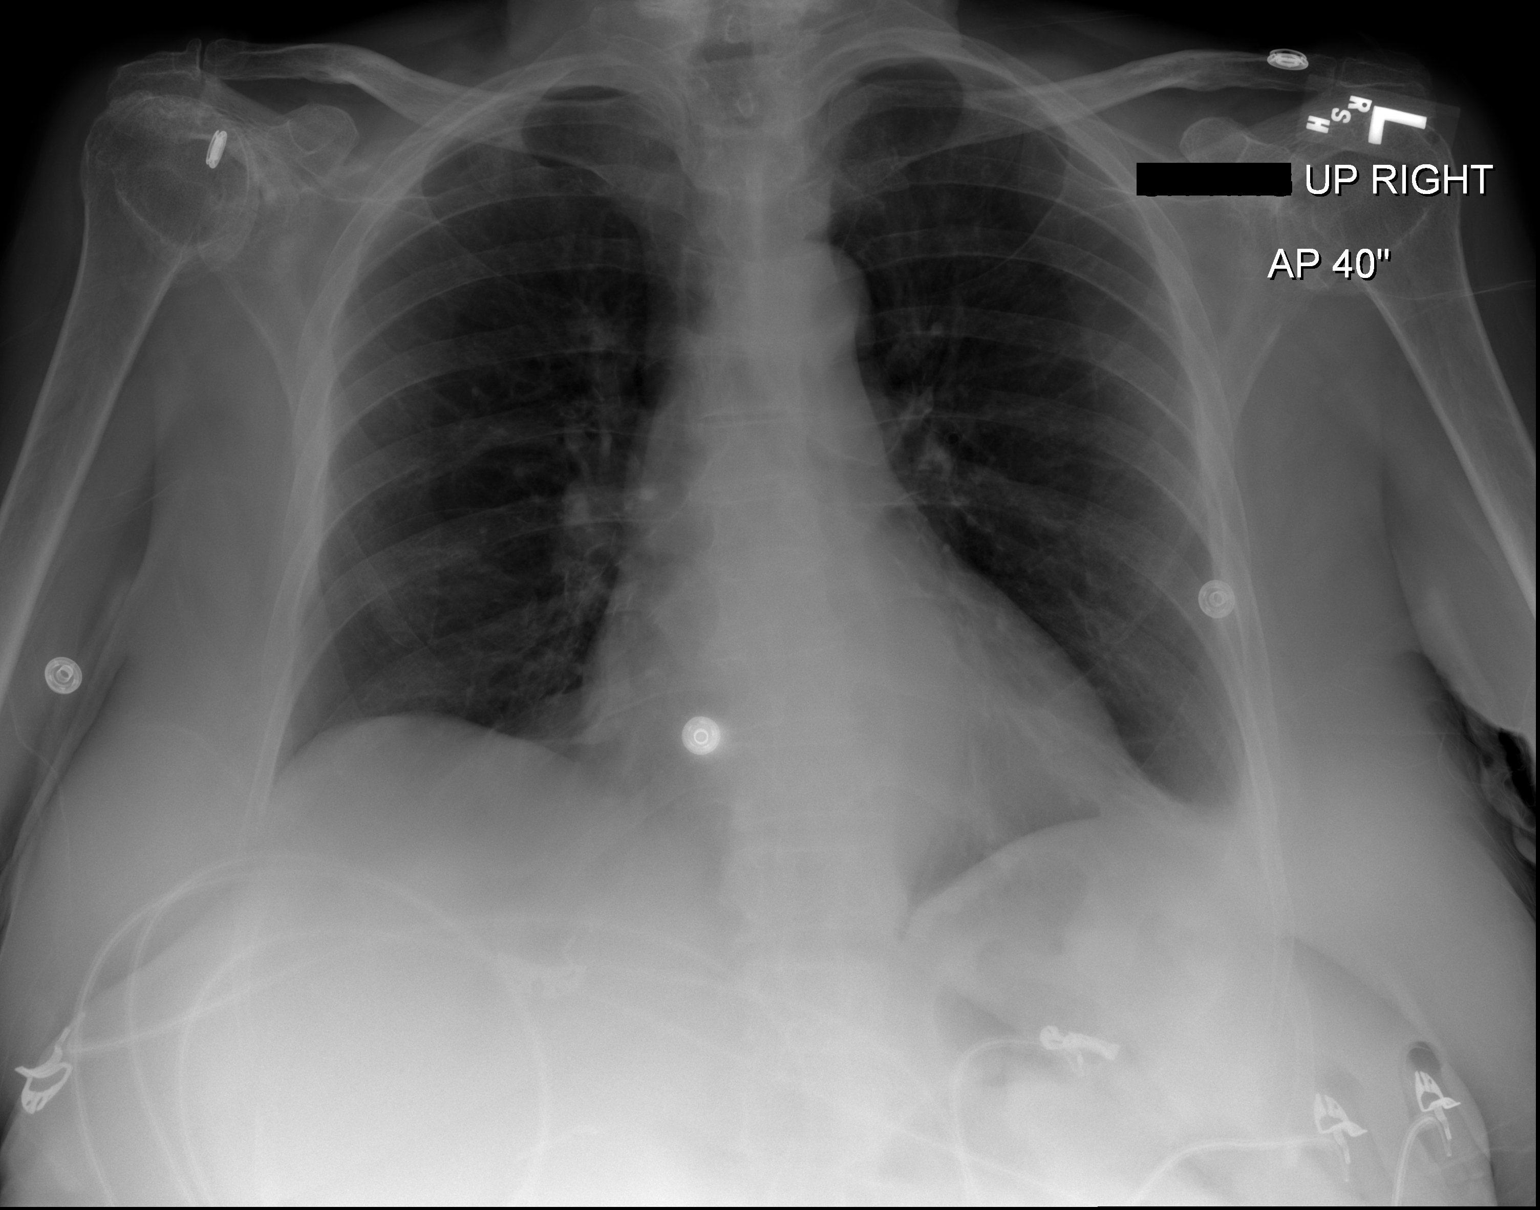

[w chest lat]
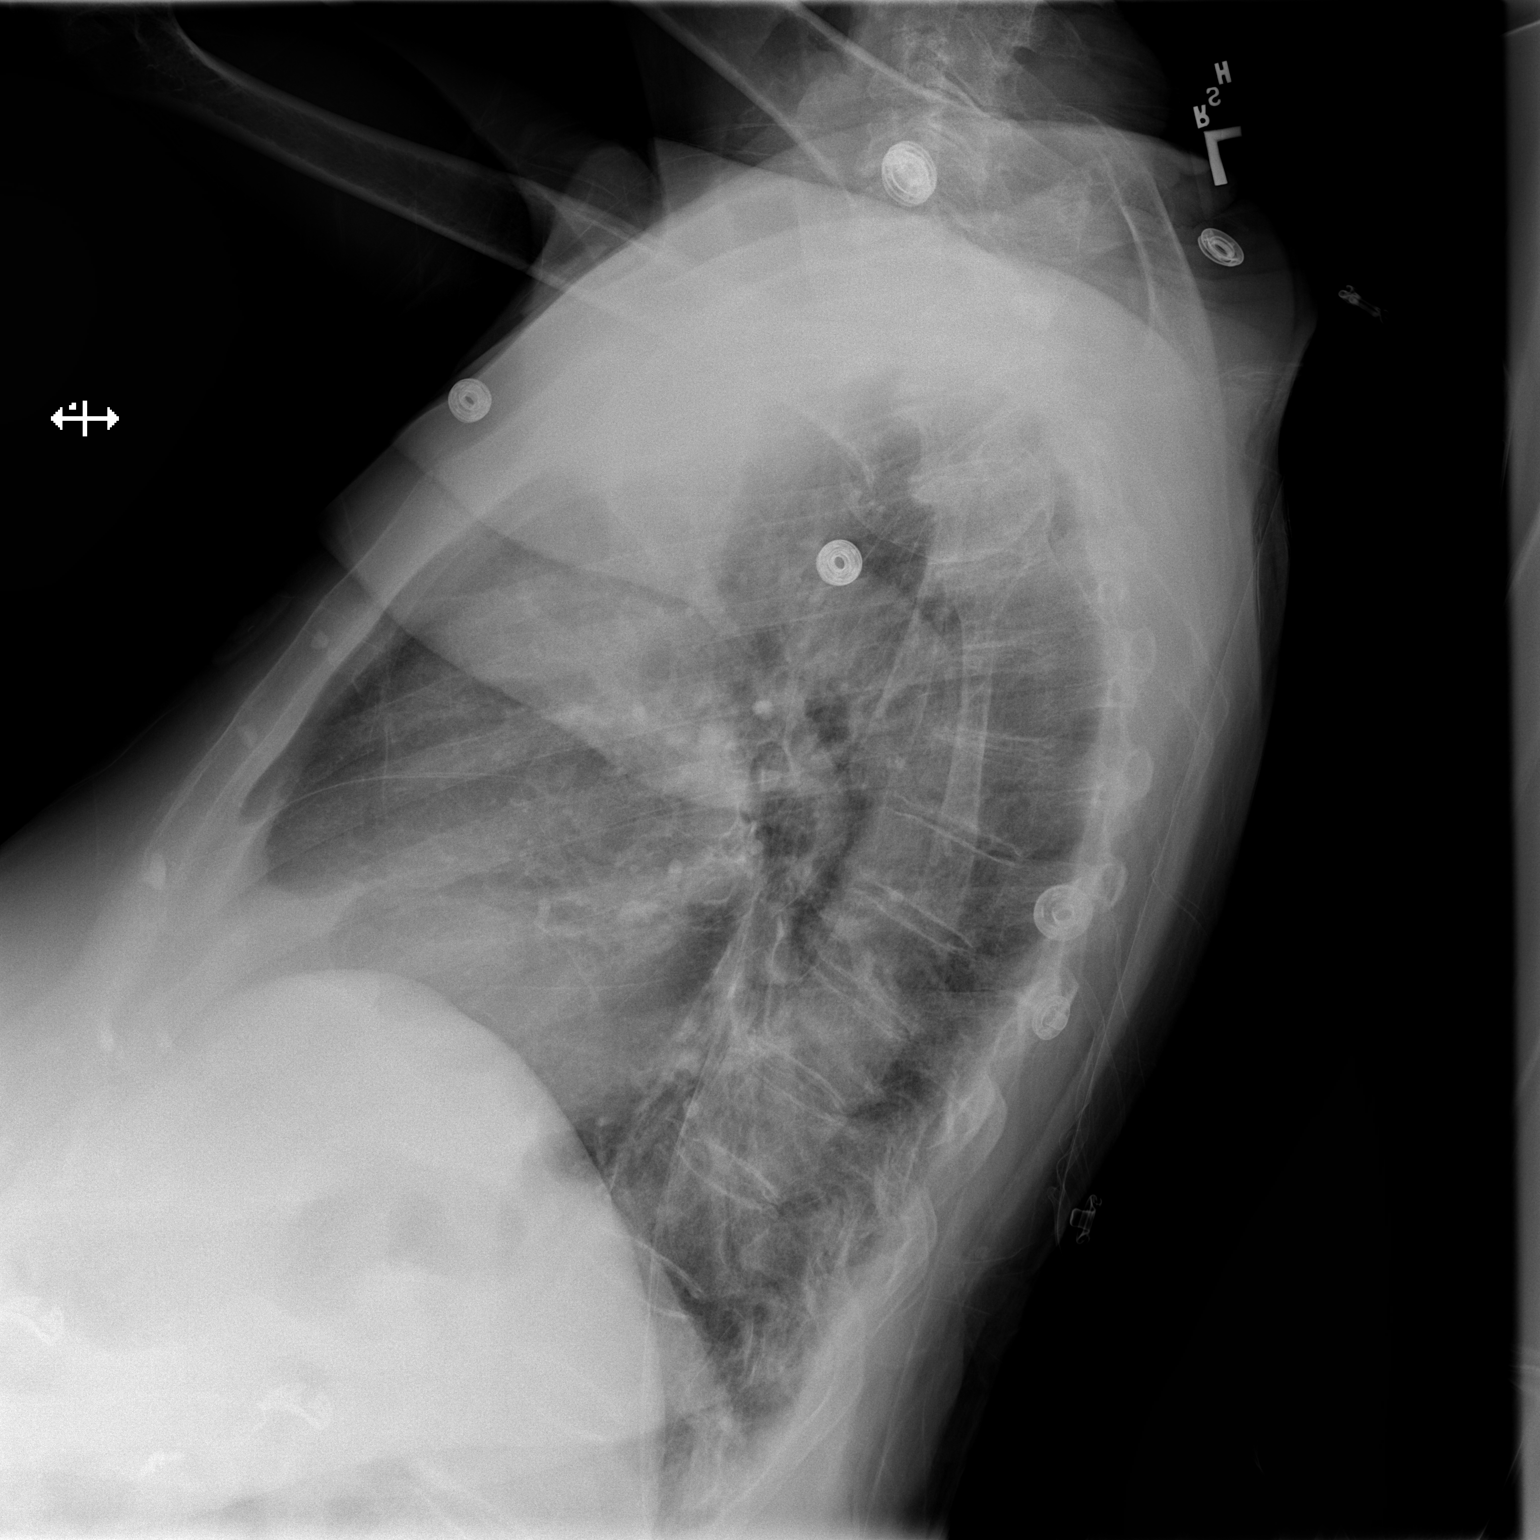

[2 of 2 positions shown; findings below may reference images not displayed]

FINDINGS: Heart size and pulmonary vascularity are normal and the
lungs are clear except for slight unchanged scarring at the left
lung base.  No acute osseous abnormality.  Moderate arthritis of
the right shoulder joint.
IMPRESSION: No acute abnormality.

## 2013-03-03 IMAGING — CT CT HEAD W/O CM
1 of 2 series · 16 of 30 positions shown, 20 images · non-contrast
Comparison: November 08, 2007

CLINICAL DATA: Headache and dizziness; question of recent trauma

CT HEAD WITHOUT CONTRAST
TECHNIQUE: Contiguous axial images were obtained from the base of
the skull through the vertex without contrast. Study was obtained
within 24 hours of patient arrival at the emergency department.

[Series 3: recon 2: brain · axial · 0.47mm/px · z∈[+119,+252]mm · 16 of 56 slices shown, 20 images]
[im 3/56  brain]
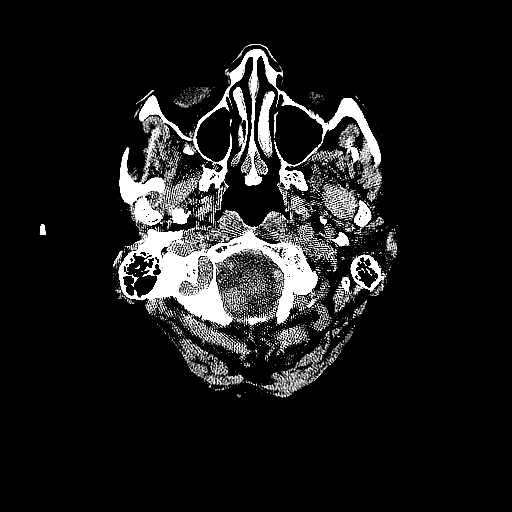
[im 3/56  bone]
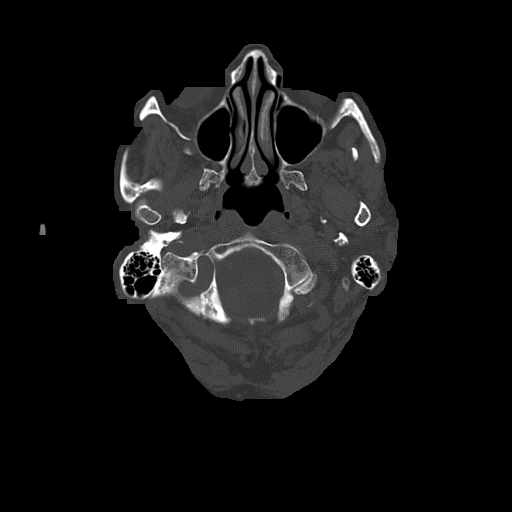
[im 6/56  brain]
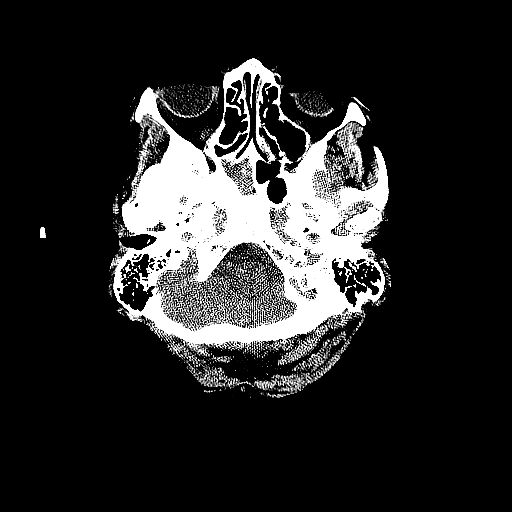
[im 9/56  brain]
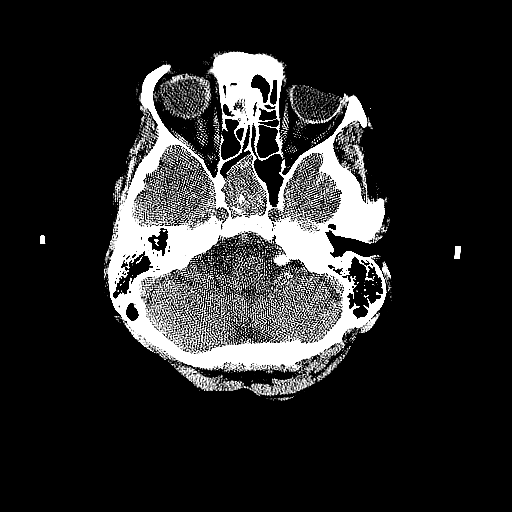
[im 12/56  brain]
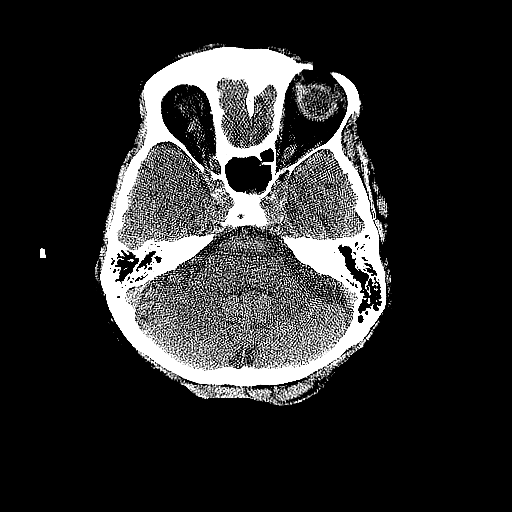
[im 18/56  brain]
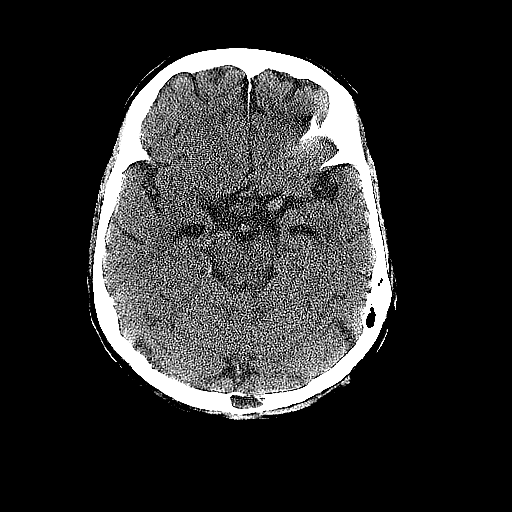
[im 18/56  bone]
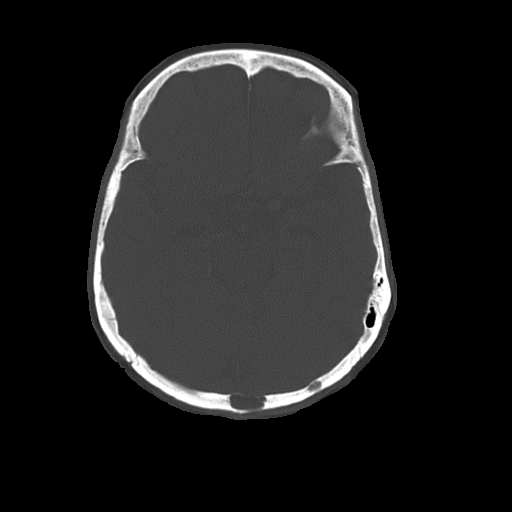
[im 21/56  brain]
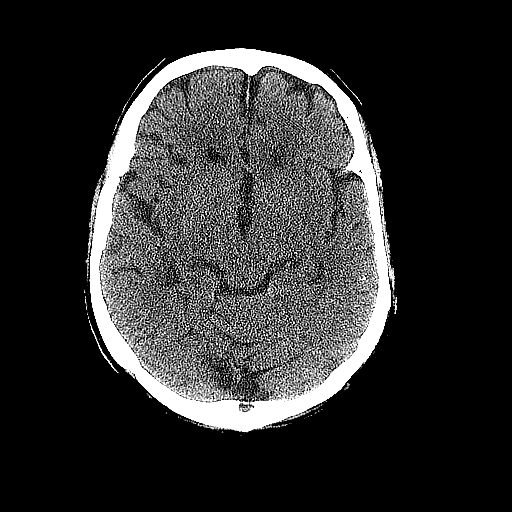
[im 24/56  brain]
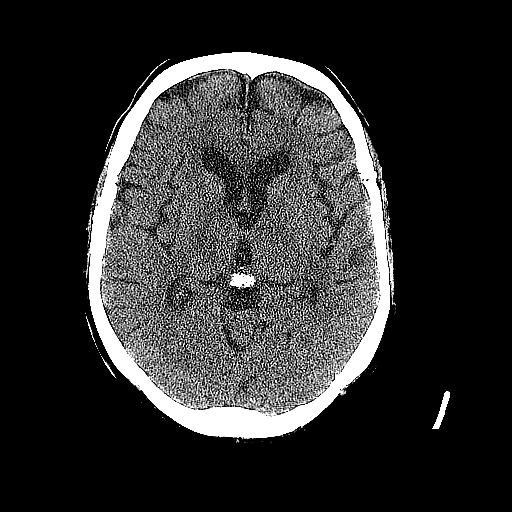
[im 27/56  brain]
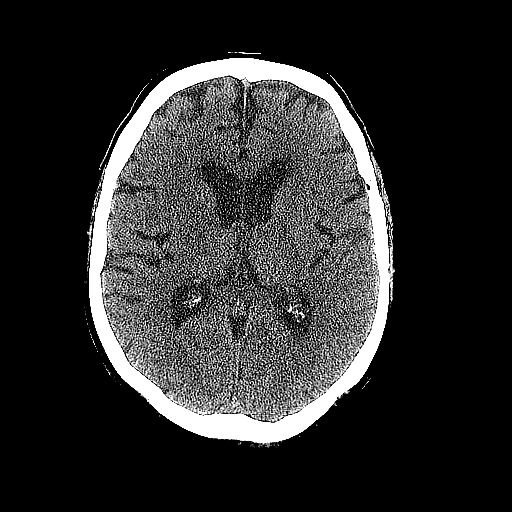
[im 29/56  brain]
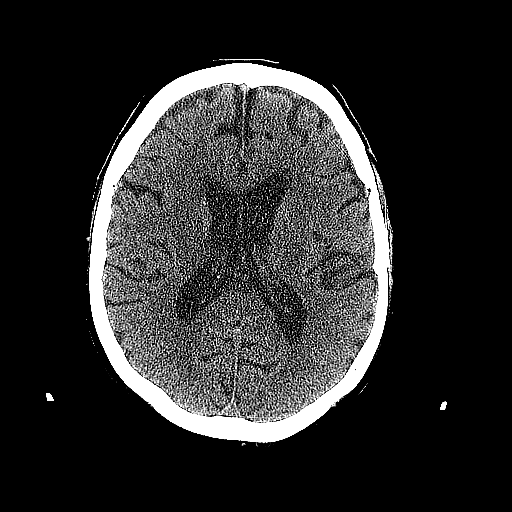
[im 29/56  bone]
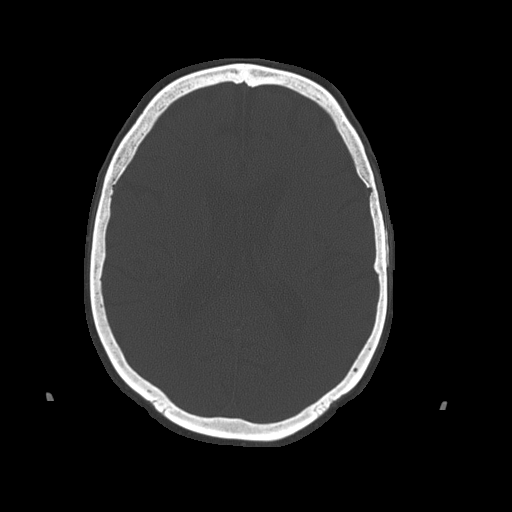
[im 32/56  brain]
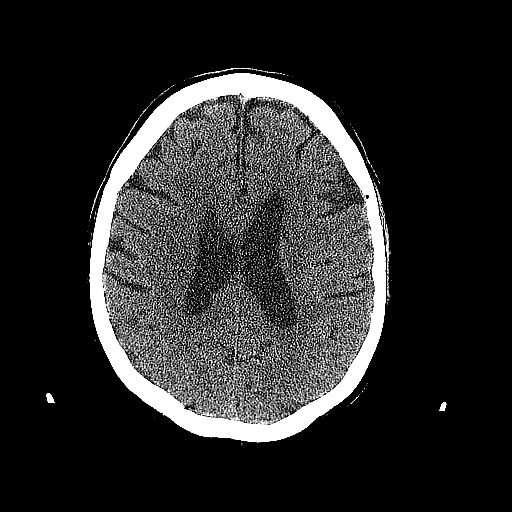
[im 35/56  brain]
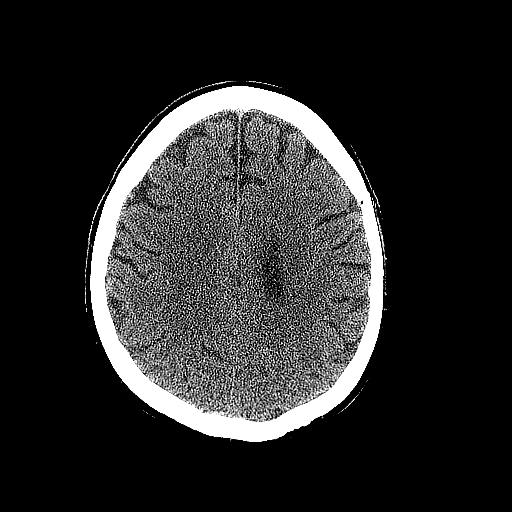
[im 38/56  brain]
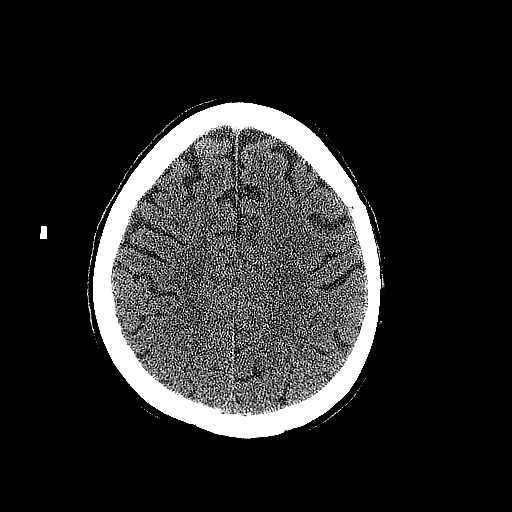
[im 44/56  brain]
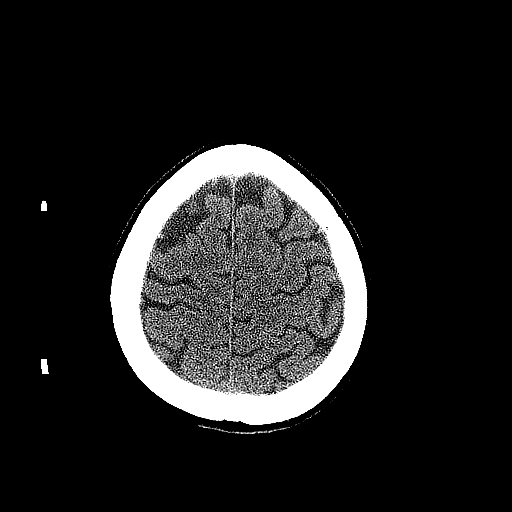
[im 44/56  bone]
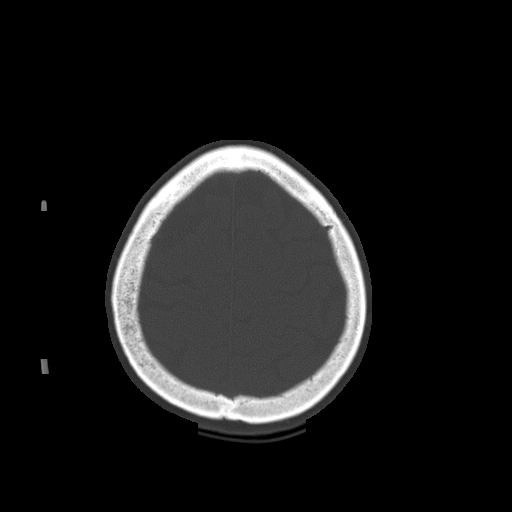
[im 47/56  brain]
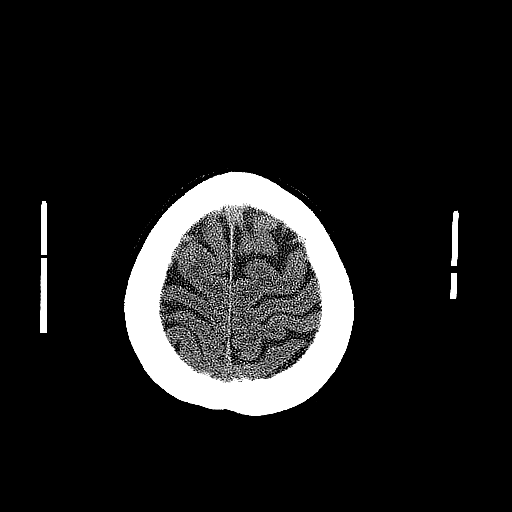
[im 50/56  brain]
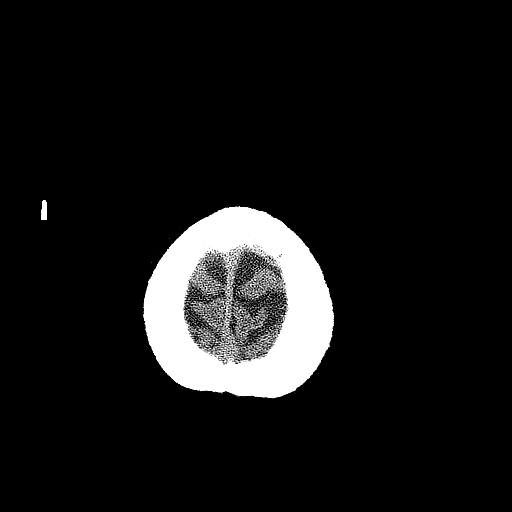
[im 53/56  brain]
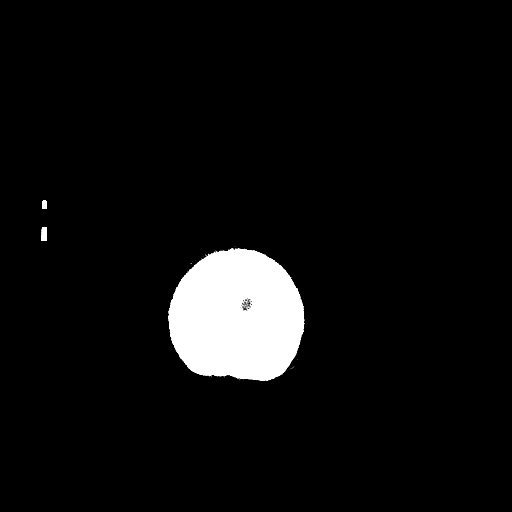

[16 of 30 positions shown; findings below may reference images not displayed]

FINDINGS: There is mild diffuse atrophy.  There is no mass,
hemorrhage, extra-axial fluid collection, or midline shift.

There is rather minimal periventricular small vessel disease in the
centra semiovale bilaterally.  No acute infarct seen.  Gray-white
compartments are otherwise normal.

Bony calvarium appears intact.  The mastoid air cells are clear.
There is extensive consolidation in the right sphenoid sinus
region.
IMPRESSION: Atrophy with minimal small vessel disease.  Right sphenoid sinus
disease.  Intracranial mass, hemorrhage, or acute infarct.

## 2013-03-08 ENCOUNTER — Telehealth: Payer: Self-pay | Admitting: Internal Medicine

## 2013-03-08 NOTE — Telephone Encounter (Signed)
error 

## 2013-03-09 ENCOUNTER — Ambulatory Visit (INDEPENDENT_AMBULATORY_CARE_PROVIDER_SITE_OTHER): Payer: Medicare Other | Admitting: Internal Medicine

## 2013-03-09 ENCOUNTER — Encounter: Payer: Self-pay | Admitting: Internal Medicine

## 2013-03-09 VITALS — BP 138/98 | HR 97 | Temp 98.2°F | Ht 62.0 in

## 2013-03-09 DIAGNOSIS — I10 Essential (primary) hypertension: Secondary | ICD-10-CM

## 2013-03-09 DIAGNOSIS — G479 Sleep disorder, unspecified: Secondary | ICD-10-CM | POA: Diagnosis not present

## 2013-03-09 DIAGNOSIS — R300 Dysuria: Secondary | ICD-10-CM | POA: Diagnosis not present

## 2013-03-09 DIAGNOSIS — N390 Urinary tract infection, site not specified: Secondary | ICD-10-CM

## 2013-03-09 DIAGNOSIS — F329 Major depressive disorder, single episode, unspecified: Secondary | ICD-10-CM | POA: Diagnosis not present

## 2013-03-09 LAB — POCT URINALYSIS DIPSTICK
Glucose, UA: NEGATIVE
Leukocytes, UA: NEGATIVE
Spec Grav, UA: >=1.03

## 2013-03-09 MED ORDER — VENLAFAXINE HCL ER 75 MG PO CP24: ORAL_CAPSULE | ORAL | Status: DC

## 2013-03-09 MED ORDER — SULFAMETHOXAZOLE-TRIMETHOPRIM 800-160 MG PO TABS: 1.0000 | ORAL_TABLET | Freq: Two times a day (BID) | ORAL | Status: DC

## 2013-03-10 ENCOUNTER — Other Ambulatory Visit: Payer: Self-pay | Admitting: *Deleted

## 2013-03-10 NOTE — Telephone Encounter (Signed)
Okay to refill? I called the pharmacy, it was last prescribed by you in July 2013. Filled on 04/06/12, 05/13/12, 06/15/12, & 03/09/13

## 2013-03-11 ENCOUNTER — Telehealth: Payer: Self-pay | Admitting: *Deleted

## 2013-03-11 LAB — CULTURE, URINE COMPREHENSIVE: Colony Count: NO GROWTH

## 2013-03-11 NOTE — Telephone Encounter (Signed)
See previous message

## 2013-03-11 NOTE — Telephone Encounter (Signed)
We did not discuss restarting this medication.  I just started her on Trazodone.  Hold on refill since we just started the trazodone.  She did not mention anything to me regarding needing this medication or restarting.  Let me know if a problem.

## 2013-03-12 NOTE — Telephone Encounter (Signed)
error 

## 2013-03-13 ENCOUNTER — Encounter: Payer: Self-pay | Admitting: Internal Medicine

## 2013-03-13 NOTE — Assessment & Plan Note (Signed)
On Effexor.  Has been referred to psych for evaluation and treatment recommendations.

## 2013-03-13 NOTE — Assessment & Plan Note (Signed)
On Trazodone 50mg  q hs now.  Still not sleeping through the night.  Discussed some behavioral changes.  See if this improves sleep.  May need to continue to adjust her trazodone dosage.  Follow.

## 2013-03-13 NOTE — Progress Notes (Signed)
Subjective:    Patient ID: [name], female    DOB: 10-25-1925, 79 y.o.   MRN: [id number]  Urinary Tract Infection  Associated symptoms include frequency.  Urinary Frequency  Associated symptoms include frequency.  79 year old female with past history of uterine and vulvar cancer, hypertension and hypercholesterolemia who comes in today as a work in with concerns regarding urinary frequency.  She is accompanied by her husband.  History obtained from both of them.  States she is concerned regarding a possible urinary tract infection.  Reports increased urinary frequency and dysuria.  Also reports some increased bladder pressure.  Has been seeing Michiel Cowboy for reoccurring uti.  Bladder has dropped.  Gave her some estrogen cream.  Had f/u CT scan recently and was told she had a cyst.  She states her symptoms feel just like it feels when she gets a urinary tract infection.  Husband also reports that when she gets infection, sometimes she will seem a little more confused.  Treating the infection, improves the confusion.     Past Medical History  Diagnosis Date  . Cancer     uterine and questionable vulvar, s/p hysterectomy  . Hypercholesterolemia   . Chronic headaches   . GERD (gastroesophageal reflux disease)   . Glaucoma   . Arthritis   . Right bundle branch block   . Reactive airway disease   . Cystocele   . Rectocele   . Hypertension     Current Outpatient Prescriptions on File Prior to Visit  Medication Sig Dispense Refill  . aspirin EC 81 MG tablet Take 81 mg by mouth every morning.      [name] atorvastatin (LIPITOR) 10 MG tablet Take 10 mg by mouth 3 (three) times a week. At bedtime.  Monday, Wednesday, friday      . Multiple Vitamins-Minerals (PRESERVISION AREDS) TABS Take 1 tablet by mouth every morning.      . Omega-3 Fatty Acids (FISH OIL CONCENTRATE PO) Take 1 capsule by mouth 2 (two) times daily. 1750mg       . omeprazole (PRILOSEC) 20 MG capsule Take 20 mg by mouth  every morning.      . travoprost, benzalkonium, (TRAVATAN) 0.004 % ophthalmic solution Place 1 drop into both eyes at bedtime.      . traZODone (DESYREL) 50 MG tablet Take 0.5-1 tablets (25-50 mg total) by mouth at bedtime as needed for sleep.  30 tablet  0   No current facility-administered medications on file prior to visit.    Review of Systems  Genitourinary: Positive for frequency.  No chest pain, tightness or palpitations.  No increased shortness of breath, cough or congestion.  No nausea or vomiting.  No abdominal pain or cramping.  Some pressure over the bladder.  No bowel change, such as diarrhea, constipation, BRBPR or melana.  Has been having reoccurring urinary tract infections.  Following with urology.  Had CT.  Benign cyst.  Now with increased urinary frequency and dysuria.  No vaginal symptoms.  Reports that she is still not sleeping well.  Mind not shutting off.  Taking the trazodone.  Plans to work her some behavioral issues related to sleep.       Objective:   Physical Exam  Filed Vitals:   03/09/13 1125  BP: 138/98  Pulse: 97  Temp: 98.2 F (98.52 C)   79 year old female in no acute distress.   HEENT:  Nares- clear.  Oropharynx - without lesions.  NECK:  Supple.  Nontender.  No audible bruit.  HEART:  Appears to be regular. LUNGS:  No crackles or wheezing audible.  Respirations even and unlabored.  RADIAL PULSE:  Equal bilaterally.   ABDOMEN:  Soft, nontender.  Bowel sounds present and normal.  No audible abdominal bruit.   BACK:  Non tender.  No CVA tenderness.           Assessment & Plan:  POSSIBLE UTI.  Urine dip negative.  Will send culture.  Will go ahead and cover with cipro until culture returns (given symptoms and her history).  Follow.  If persistent symptoms/problems, will refer back to urology.   GI.  Colonoscopy 03/05/05 with diverticulosis.  Currently doing well.   CARDIOVASCULAR.  Stress test 12/18/10 revealed no ischemia.  [facility] 12/18/10 revealed EF  60% with mild RV and RA enlargement, mild LVH, mild mitral and tricuspid insufficiency with documented mild pulmonary hypertension.  Saw Dr Lady Gary.  Felt no further w/up warranted.  KNEE PAIN.  Seeing Dr Yisroel Ramming.  Needs knee surgery.      HEALTH MAINTENANCE.  Up to date with her physicals.

## 2013-03-13 NOTE — Assessment & Plan Note (Signed)
Blood pressure doing well on no medication.  Follow.   

## 2013-03-25 DIAGNOSIS — R269 Unspecified abnormalities of gait and mobility: Secondary | ICD-10-CM | POA: Diagnosis not present

## 2013-03-25 DIAGNOSIS — G4452 New daily persistent headache (NDPH): Secondary | ICD-10-CM | POA: Diagnosis not present

## 2013-03-29 DIAGNOSIS — T887XXA Unspecified adverse effect of drug or medicament, initial encounter: Secondary | ICD-10-CM | POA: Diagnosis not present

## 2013-03-29 DIAGNOSIS — R51 Headache: Secondary | ICD-10-CM | POA: Diagnosis not present

## 2013-04-05 DIAGNOSIS — M5030 Other cervical disc degeneration, unspecified cervical region: Secondary | ICD-10-CM | POA: Diagnosis not present

## 2013-04-05 DIAGNOSIS — M9981 Other biomechanical lesions of cervical region: Secondary | ICD-10-CM | POA: Diagnosis not present

## 2013-04-06 ENCOUNTER — Ambulatory Visit: Payer: PRIVATE HEALTH INSURANCE | Admitting: Internal Medicine

## 2013-04-12 DIAGNOSIS — R42 Dizziness and giddiness: Secondary | ICD-10-CM | POA: Diagnosis not present

## 2013-04-12 DIAGNOSIS — R000 Tachycardia, unspecified: Secondary | ICD-10-CM | POA: Diagnosis not present

## 2013-04-12 DIAGNOSIS — I499 Cardiac arrhythmia, unspecified: Secondary | ICD-10-CM | POA: Diagnosis not present

## 2013-04-12 DIAGNOSIS — R0602 Shortness of breath: Secondary | ICD-10-CM | POA: Diagnosis not present

## 2013-04-14 DIAGNOSIS — F330 Major depressive disorder, recurrent, mild: Secondary | ICD-10-CM | POA: Diagnosis not present

## 2013-04-16 DIAGNOSIS — I499 Cardiac arrhythmia, unspecified: Secondary | ICD-10-CM | POA: Diagnosis not present

## 2013-04-17 DIAGNOSIS — I499 Cardiac arrhythmia, unspecified: Secondary | ICD-10-CM | POA: Diagnosis not present

## 2013-04-19 ENCOUNTER — Ambulatory Visit (INDEPENDENT_AMBULATORY_CARE_PROVIDER_SITE_OTHER): Payer: Medicare Other | Admitting: Internal Medicine

## 2013-04-19 ENCOUNTER — Encounter: Payer: Self-pay | Admitting: Internal Medicine

## 2013-04-19 VITALS — BP 130/70 | HR 88 | Temp 98.9°F | Ht 62.0 in | Wt 153.5 lb

## 2013-04-19 DIAGNOSIS — R51 Headache: Secondary | ICD-10-CM

## 2013-04-19 DIAGNOSIS — G479 Sleep disorder, unspecified: Secondary | ICD-10-CM | POA: Diagnosis not present

## 2013-04-19 DIAGNOSIS — E7800 Pure hypercholesterolemia, unspecified: Secondary | ICD-10-CM

## 2013-04-19 DIAGNOSIS — I10 Essential (primary) hypertension: Secondary | ICD-10-CM

## 2013-04-19 DIAGNOSIS — F329 Major depressive disorder, single episode, unspecified: Secondary | ICD-10-CM | POA: Diagnosis not present

## 2013-04-19 DIAGNOSIS — I2789 Other specified pulmonary heart diseases: Secondary | ICD-10-CM

## 2013-04-19 DIAGNOSIS — R42 Dizziness and giddiness: Secondary | ICD-10-CM | POA: Diagnosis not present

## 2013-04-19 DIAGNOSIS — I272 Pulmonary hypertension, unspecified: Secondary | ICD-10-CM

## 2013-04-22 ENCOUNTER — Encounter: Payer: Self-pay | Admitting: Internal Medicine

## 2013-04-22 NOTE — Assessment & Plan Note (Addendum)
Has seen Dr  Lady Gary and Dr Meredeth Ide.  Dr Meredeth Ide was to follow up with a follow up ECHO.  Apparently just had f/u ECHO with cardiology.  See if we can obtain records.

## 2013-04-22 NOTE — Assessment & Plan Note (Signed)
Resolved

## 2013-04-22 NOTE — Assessment & Plan Note (Signed)
Tapering off effexor.  On zoloft now.  Seeing Dr Maryruth Bun.  Feeling better.  Follow.

## 2013-04-22 NOTE — Progress Notes (Signed)
Subjective:    Patient ID: [name], female    DOB: 1925/12/13, 79 y.o.   MRN: [id number]  HPI 79 year old female with past history of uterine and vulvar cancer, hypertension and hypercholesterolemia who comes in today for a scheduled follow up.  She is accompanied by her daughter.  History obtained from both of them.  Has been having issues with increased depression and increased stress.  Recently was evaluated by a geriatrician (Dr Cliffton Asters - [facility]).  She started her on Effexor.  She was also having problems sleeping.  Was started on trazodone last visit and was referred to Dr Maryruth Bun.  She is now in the process of tapering off Effexor.  Is taking zoloft now.  Feels better.  The previous light headedness and dizziness have resolved.  No overall headache.  She is still having the pain behind the left eye.  Has had ENT evaluation and has seen two neurologists.  Has had MRI.  Unclear as to the exact etiology.  She informs me that Dr Sherryll Burger has referred her to neurology at [facility] for further evaluation.  She has recently been evaluated by cardiology (Dr Sherryll Burger at [facility].  States had ECHO and holter monitor.  States everything has checked out fine.  She denies any chest pain or tightness.  Breathing stable.  Overall feels better.  Daughter reports that her mother is doing better.     Past Medical History  Diagnosis Date  . Cancer     uterine and questionable vulvar, s/p hysterectomy  . Hypercholesterolemia   . Chronic headaches   . GERD (gastroesophageal reflux disease)   . Glaucoma   . Arthritis   . Right bundle branch block   . Reactive airway disease   . Cystocele   . Rectocele   . Hypertension     Current Outpatient Prescriptions on File Prior to Visit  Medication Sig Dispense Refill  . aspirin EC 81 MG tablet Take 81 mg by mouth every morning.      [name] atorvastatin (LIPITOR) 10 MG tablet Take 10 mg by mouth 3 (three) times a week. At bedtime.  Monday, Wednesday, friday      .  Multiple Vitamins-Minerals (PRESERVISION AREDS) TABS Take 1 tablet by mouth every morning.      . Omega-3 Fatty Acids (FISH OIL CONCENTRATE PO) Take 1 capsule by mouth 2 (two) times daily. 1750mg       . omeprazole (PRILOSEC) 20 MG capsule Take 20 mg by mouth every morning.      . travoprost, benzalkonium, (TRAVATAN) 0.004 % ophthalmic solution Place 1 drop into both eyes at bedtime.      . traZODone (DESYREL) 50 MG tablet Take 0.5-1 tablets (25-50 mg total) by mouth at bedtime as needed for sleep.  30 tablet  0   No current facility-administered medications on file prior to visit.    Review of Systems Head is better.  No light headedness or dizziness.  See above.  No increased sinus congestion or drainage.  Using flonase daily.  No chest pain or tightness.  Breathing stable.  No acid reflux.  No nausea or vomiting.  No abdominal pain or cramping.  No bowel change.         Objective:   Physical Exam  Filed Vitals:   04/19/13 1127  BP: 130/70  Pulse: 88  Temp: 98.9 F (43.33 C)   79 year old female in no acute distress.   HEENT:  Nares- clear.  Oropharynx - without lesions.  No significant tenderness to palpation over the sinus/temporal region. NECK:  Supple.  Nontender.  No audible bruit.  HEART:  Appears to be regular. LUNGS:  No crackles or wheezing audible.  Respirations even and unlabored.  RADIAL PULSE:  Equal bilaterally.   ABDOMEN:  Soft, nontender.  Bowel sounds present and normal.  No audible abdominal bruit.   EXTREMITIES:  No increased edema present.  DP pulses palpable and equal bilaterally.          Assessment & Plan:  GI.  Colonoscopy 03/05/05 with diverticulosis.  Currently doing well.   CARDIOVASCULAR.  Stress test 12/18/10 revealed no ischemia.  ECHO 12/18/10 revealed EF 60% with mild RV and RA enlargement, mild LVH, mild mitral and tricuspid insufficiency with documented mild pulmonary hypertension.  Saw Dr Lady Gary.  Felt no further w/up warranted.  Has recently been  reevaluated by Dr Sherryll Burger - cardiology with [facility].  States everything has checked out fine.  Currently doing well.   KNEE PAIN.  Seeing Dr Yisroel Ramming.    HEALTH MAINTENANCE.  Up to date with her physicals.

## 2013-04-22 NOTE — Assessment & Plan Note (Signed)
On trazodone.  Tapering off effexor.  On zoloft.  Doing better.

## 2013-04-22 NOTE — Assessment & Plan Note (Signed)
Low cholesterol diet.  Continue current medication regimen.  Follow lipid panel and liver function.   

## 2013-04-22 NOTE — Assessment & Plan Note (Signed)
Previous MRI negative.  ESR not significantly elevated.  Has seen neurology x 2.  Referred to [facility] Neurology by Dr Sherryll Burger.  Head is better.  No dizziness or light headedness.  Now with pain localized behind the left eye.  (she reports that she feels like the pain is up in her nose).   Will stop the Flonase.  Use saline.  Follow.

## 2013-04-22 NOTE — Assessment & Plan Note (Signed)
Blood pressure doing well on no medication.  Follow.   

## 2013-04-27 DIAGNOSIS — M5030 Other cervical disc degeneration, unspecified cervical region: Secondary | ICD-10-CM | POA: Diagnosis not present

## 2013-04-27 DIAGNOSIS — M9981 Other biomechanical lesions of cervical region: Secondary | ICD-10-CM | POA: Diagnosis not present

## 2013-04-29 ENCOUNTER — Encounter: Payer: Self-pay | Admitting: Internal Medicine

## 2013-04-29 DIAGNOSIS — F330 Major depressive disorder, recurrent, mild: Secondary | ICD-10-CM | POA: Diagnosis not present

## 2013-05-10 ENCOUNTER — Telehealth: Payer: Self-pay | Admitting: Internal Medicine

## 2013-05-10 NOTE — Telephone Encounter (Signed)
[name], could you please add this patient to Dr. Lorin Picket schedule for 12:15 on Wed. 8/6 (reevaluate headaches)-Thanks

## 2013-05-10 NOTE — Telephone Encounter (Signed)
Pt's daughter came by and says that her mom is still having really bad headaches and was wondering is something could be called in for her migraines they have become very painful and bad. Pt uses [employer].

## 2013-05-10 NOTE — Telephone Encounter (Signed)
If she is having increased pain/problems - needs reevaluation.  I can see her on 05/12/13 - work in for this.  Block appt.  Also, need to see if she saw the other neurologist.  appt this month?  If any acute problems,will need eval before appt

## 2013-05-12 ENCOUNTER — Encounter: Payer: Self-pay | Admitting: Internal Medicine

## 2013-05-12 ENCOUNTER — Other Ambulatory Visit: Payer: Self-pay | Admitting: *Deleted

## 2013-05-12 ENCOUNTER — Telehealth: Payer: Self-pay | Admitting: *Deleted

## 2013-05-12 ENCOUNTER — Ambulatory Visit (INDEPENDENT_AMBULATORY_CARE_PROVIDER_SITE_OTHER): Payer: Medicare Other | Admitting: Internal Medicine

## 2013-05-12 VITALS — BP 130/72 | HR 85 | Temp 98.7°F | Ht 62.0 in | Wt 155.8 lb

## 2013-05-12 DIAGNOSIS — Z87440 Personal history of urinary (tract) infections: Secondary | ICD-10-CM | POA: Diagnosis not present

## 2013-05-12 DIAGNOSIS — E7800 Pure hypercholesterolemia, unspecified: Secondary | ICD-10-CM

## 2013-05-12 DIAGNOSIS — I272 Pulmonary hypertension, unspecified: Secondary | ICD-10-CM

## 2013-05-12 DIAGNOSIS — N390 Urinary tract infection, site not specified: Secondary | ICD-10-CM

## 2013-05-12 DIAGNOSIS — R51 Headache: Secondary | ICD-10-CM

## 2013-05-12 DIAGNOSIS — I2789 Other specified pulmonary heart diseases: Secondary | ICD-10-CM

## 2013-05-12 LAB — POCT URINALYSIS DIPSTICK
Nitrite, UA: NEGATIVE
Protein, UA: NEGATIVE
Urobilinogen, UA: 0.2
pH, UA: 7

## 2013-05-12 LAB — CBC WITH DIFFERENTIAL/PLATELET
Basophils Absolute: 0 10*3/uL (ref 0.0–0.1)
Basophils Relative: 0.7 % (ref 0.0–3.0)
Eosinophils Absolute: 0.1 10*3/uL (ref 0.0–0.7)
HCT: 39.3 % (ref 36.0–46.0)
Hemoglobin: 12.9 g/dL (ref 12.0–15.0)
Lymphs Abs: 2 10*3/uL (ref 0.7–4.0)
MCHC: 32.8 g/dL (ref 30.0–36.0)
MCV: 98.8 fl (ref 78.0–100.0)
Monocytes Absolute: 0.5 10*3/uL (ref 0.1–1.0)
Neutro Abs: 4.5 10*3/uL (ref 1.4–7.7)
RDW: 12.7 % (ref 11.5–14.6)

## 2013-05-12 LAB — SEDIMENTATION RATE: Sed Rate: 56 mm/hr — ABNORMAL HIGH (ref 0–22)

## 2013-05-12 MED ORDER — CEFUROXIME AXETIL 250 MG PO TABS: 250.0000 mg | ORAL_TABLET | Freq: Two times a day (BID) | ORAL | Status: DC

## 2013-05-12 MED ORDER — TRAZODONE HCL 50 MG PO TABS: 25.0000 mg | ORAL_TABLET | Freq: Every evening | ORAL | Status: DC | PRN

## 2013-05-12 MED ORDER — PREDNISONE 10 MG PO TABS: ORAL_TABLET | ORAL | Status: DC

## 2013-05-12 MED ORDER — METAXALONE 800 MG PO TABS: ORAL_TABLET | ORAL | Status: DC

## 2013-05-12 NOTE — Telephone Encounter (Signed)
Patient has been added to the schedule.

## 2013-05-12 NOTE — Telephone Encounter (Signed)
Mr. Gascoigne called to give you the name of her Neurologist. Dr. Sharene Skeans in [facility] with [facility] [phone] & Fax# [phone])

## 2013-05-12 NOTE — Telephone Encounter (Signed)
Called and left information for them to see about working her in for an earlier appt.

## 2013-05-13 ENCOUNTER — Encounter: Payer: Self-pay | Admitting: Internal Medicine

## 2013-05-13 ENCOUNTER — Telehealth: Payer: Self-pay | Admitting: Internal Medicine

## 2013-05-13 DIAGNOSIS — R51 Headache: Secondary | ICD-10-CM

## 2013-05-13 NOTE — Assessment & Plan Note (Signed)
Has seen Dr  Lady Gary and Dr Meredeth Ide.  Dr Meredeth Ide was to follow up with a follow up ECHO.  Apparently just had f/u ECHO with cardiology.  Still need results.

## 2013-05-13 NOTE — Progress Notes (Signed)
Subjective:    Patient ID: [name], female    DOB: 04-21-1926, 79 y.o.   MRN: [id number]  Headache   Urinary Tract Infection  Associated symptoms include frequency.  Urinary Frequency  Associated symptoms include frequency.  79 year old female with past history of uterine and vulvar cancer, hypertension and hypercholesterolemia who comes in today as a work in with concerns regarding persistent/worsening headache.  She is accompanied by her husband.  History obtained from both of them.  To review she had been having issues with increased depression and increased stress.  Recently was evaluated by a geriatrician (Dr Cliffton Asters - [facility]).  She started her on Effexor.  She was also having problems sleeping. Was started on trazodone and was referred to Dr Maryruth Bun.  Saw Dr Maryruth Bun and was started on Zoloft.   Is taking zoloft now.  Last visit was feeling better.  See that note for details.  She was still having the pain behind the left eye.  Has had ENT evaluation and has seen two neurologists.  Has had MRI.  Unclear as to the exact etiology.  She reports that the headache is persistent.  Appears to be worsening.  Is still behind the right eye and now is describing feeling pressure extending up to the top of her head.  States she has to have something to control the pain.  She informs me that Dr Sherryll Burger has referred her to neurology at [facility] for further evaluation.  She has recently been evaluated by cardiology (Dr Sherryll Burger at [facility]).  States had ECHO and holter monitor.  States everything has checked out fine.  She denies any chest pain or tightness.  Breathing stable.      Past Medical History  Diagnosis Date  . Cancer     uterine and questionable vulvar, s/p hysterectomy  . Hypercholesterolemia   . Chronic headaches   . GERD (gastroesophageal reflux disease)   . Glaucoma   . Arthritis   . Right bundle branch block   . Reactive airway disease   . Cystocele   . Rectocele   . Hypertension      Current Outpatient Prescriptions on File Prior to Visit  Medication Sig Dispense Refill  . aspirin EC 81 MG tablet Take 81 mg by mouth every morning.      [name] atorvastatin (LIPITOR) 10 MG tablet Take 10 mg by mouth 3 (three) times a week. At bedtime.  Monday, Wednesday, friday      . Multiple Vitamins-Minerals (PRESERVISION AREDS) TABS Take 1 tablet by mouth every morning.      . Omega-3 Fatty Acids (FISH OIL CONCENTRATE PO) Take 1 capsule by mouth 2 (two) times daily. 1750mg       . omeprazole (PRILOSEC) 20 MG capsule Take 20 mg by mouth every morning.      . sertraline (ZOLOFT) 25 MG tablet Take 25 mg by mouth daily.      . travoprost, benzalkonium, (TRAVATAN) 0.004 % ophthalmic solution Place 1 drop into both eyes at bedtime.      [name] venlafaxine XR (EFFEXOR-XR) 75 MG 24 hr capsule Take 75 mg by mouth daily. Take one capsule bid       No current facility-administered medications on file prior to visit.    Review of Systems  Genitourinary: Positive for frequency.  Neurological: Positive for headaches.  persistent worsening headache.  Increased dizziness/light headedness.  Headaches have been an issue for the last year now.  Has had extensive w/up.  See above.  Describing now that her headache is still behind the right eye and extends up to the top of her head.  Husband is concerned about aspertane.  States she was drinking an increased amount of diet soft drinks.  Has recently stopped.  No increased sinus congestion or drainage.  Minimal congestion.  Off flonase.   Flushing with saline.  She was questioning if sinus or allergy issues could be contributing to her headache.  No chest pain or tightness.  Breathing stable.  No acid reflux.  No nausea or vomiting.  No abdominal pain or cramping.  No bowel change.  Not sleeping.         Objective:   Physical Exam  Filed Vitals:   05/12/13 1215  BP: 130/72  Pulse: 85  Temp: 98.7 F (69.15 C)   79 year old female in no acute distress.    HEENT:  Nares- clear.  Oropharynx - without lesions.  No significant tenderness to palpation over the sinus/temporal region. (very minimal).  NECK:  Supple.  Nontender.  No audible bruit.  HEART:  Appears to be regular. LUNGS:  No crackles or wheezing audible.  Respirations even and unlabored.  RADIAL PULSE:  Equal bilaterally.   ABDOMEN:  Soft, nontender.  Bowel sounds present and normal.  No audible abdominal bruit.   EXTREMITIES:  No increased edema present.  DP pulses palpable and equal bilaterally.          Assessment & Plan:  GI.  Colonoscopy 03/05/05 with diverticulosis.  Currently doing well.   CARDIOVASCULAR.  Stress test 12/18/10 revealed no ischemia.  ECHO 12/18/10 revealed EF 60% with mild RV and RA enlargement, mild LVH, mild mitral and tricuspid insufficiency with documented mild pulmonary hypertension.  Saw Dr Lady Gary.  Felt no further w/up warranted.  Has recently been reevaluated by Dr Sherryll Burger - cardiology with [facility].  States everything has checked out fine.  Currently doing well.   KNEE PAIN.  Seeing Dr Yisroel Ramming.    HEALTH MAINTENANCE.  Up to date with her physicals.

## 2013-05-13 NOTE — Assessment & Plan Note (Signed)
With symptoms today.  Urine reveals small leukocytes.  Will treat with ceftin 250mg  bid.  This should cover the urine and if any possible sinus infection.  Follow.

## 2013-05-13 NOTE — Assessment & Plan Note (Signed)
Persistent intermittent headache.  Appears to have worsened recently.  Has previously had MRI.  Unrevealing.  Has seen neurology, ENT and opthalmology.  No clear etiology found.  Husband is concerned about aspertane.  Recently stopped diet drinks.  Discussed caffeine withdrawal.  I do not feel this is what is contributing to her headache.  Some form of headache present now for one year.  Again, worsened recently.  Previous ESR just slightly elevated above normal.  Will recheck today.  Have to consider temporal arteritis.  No significant neck stiffness.  Consider a trial of skelaxin.  Will try to avoid narcotic pain medication.  She is scheduled to see neurology 06/11/13.  Will see if we can get an earlier appt.  Pt and husband comfortable with this plan.

## 2013-05-13 NOTE — Assessment & Plan Note (Signed)
Low cholesterol diet.  Continue current medication regimen.  Follow lipid panel and liver function.   

## 2013-05-13 NOTE — Telephone Encounter (Signed)
Order placed for surgery referral.   Discussed in detail the lab results with pts daughter Lupita Leash.  Discussed the possibility of temporal arteritis and need for prednisone and temporal artery biopsy.

## 2013-05-14 DIAGNOSIS — R51 Headache: Secondary | ICD-10-CM | POA: Diagnosis not present

## 2013-05-14 LAB — URINE CULTURE: Colony Count: 5000

## 2013-05-19 DIAGNOSIS — R51 Headache: Secondary | ICD-10-CM | POA: Diagnosis not present

## 2013-05-24 ENCOUNTER — Telehealth: Payer: Self-pay | Admitting: *Deleted

## 2013-05-25 NOTE — Telephone Encounter (Signed)
Noted.  Info entered - wrong patient.

## 2013-05-25 NOTE — Telephone Encounter (Signed)
Pt called requested that we get records from [facility]. She was seen last Friday (8/15) for back pain. Records requested and placed in your folder

## 2013-05-26 ENCOUNTER — Telehealth: Payer: Self-pay | Admitting: *Deleted

## 2013-05-26 NOTE — Telephone Encounter (Signed)
Called daughter.  Called [name].  Prednisone did not help her headaches.  No improvement in her headaches.  Will taper off prednisone.  Instructions given.  Regarding the persistent headache, discussed w/up and treatment tried so far.  No help with any measures.  Agreeable to see neurology here again.  Dr Sherryll Burger can see her tomorrow at 8:15.  appt given to [name] and she confirmed would keep appt.   Also, they are trying to reschedule the appt at [facility].  [name] to call back with appt.

## 2013-05-26 NOTE — Telephone Encounter (Signed)
Pt daughter Lupita Leash) called to report that Ms. Vetrano is having horrific headaches (not from the biopsy site) above right eye and radiates to the top of her head. This started before the biopsy. Bx was found to be normal. Sed Rate was high per Dr. Lorin Picket (66). Wants to know what the next step. Please advise

## 2013-05-27 DIAGNOSIS — R9409 Abnormal results of other function studies of central nervous system: Secondary | ICD-10-CM | POA: Diagnosis not present

## 2013-05-27 DIAGNOSIS — G43109 Migraine with aura, not intractable, without status migrainosus: Secondary | ICD-10-CM | POA: Diagnosis not present

## 2013-06-01 ENCOUNTER — Telehealth: Payer: Self-pay | Admitting: *Deleted

## 2013-06-01 NOTE — Telephone Encounter (Signed)
If the skelaxin is helping and she is not having any adverse effects, then I am ok for her to take the skelaxin 1/2 tab before bed.  Regarding the appt, if he gives me the number - I can try to call and see about rescheduling.

## 2013-06-01 NOTE — Telephone Encounter (Signed)
Pt called to report that she has been taking 1/2 tablet of Skelaxin QHS for the last 2 nights & she has done really well (headaches have improved). Wants to know if she could continue on the Skelaxin. Also stated that Dr. Sherryll Burger basically said that she couldn't do much for her. Mr. Laye reschedule the appt in [facility] for 07/29/13 & was hoping that we could possibly call and get her an earlier appointment.

## 2013-06-01 NOTE — Telephone Encounter (Signed)
Pt notified & the number to the [facility] office is: [phone] (Dr. Vickey Sages)

## 2013-06-02 ENCOUNTER — Telehealth: Payer: Self-pay | Admitting: *Deleted

## 2013-06-02 NOTE — Telephone Encounter (Signed)
Notified Mr. Burchfield that [facility] called Dr. Lorin Picket back and was able to move Mrs. Balinski appt up to Tues. 06/08/13 @ 1pm. I also informed them to be there around 12:30. Mr. Jackowski stated that he will call the office to get directions ahead of time.

## 2013-06-02 NOTE — Telephone Encounter (Signed)
Notified Mr. [name]

## 2013-06-02 NOTE — Telephone Encounter (Signed)
noted 

## 2013-06-02 NOTE — Telephone Encounter (Signed)
I have called [facility] and spoke to one person.  Told me no available appts any earlier.  Stated they could keep calling for a cancellation.  I have another call in to someone else.  Not sure that I am going to be able to get her in earlier.

## 2013-06-04 ENCOUNTER — Ambulatory Visit: Payer: Self-pay | Admitting: Nurse Practitioner

## 2013-06-08 DIAGNOSIS — G43709 Chronic migraine without aura, not intractable, without status migrainosus: Secondary | ICD-10-CM | POA: Diagnosis not present

## 2013-06-08 DIAGNOSIS — R269 Unspecified abnormalities of gait and mobility: Secondary | ICD-10-CM | POA: Diagnosis not present

## 2013-06-09 ENCOUNTER — Other Ambulatory Visit: Payer: Self-pay | Admitting: *Deleted

## 2013-06-10 MED ORDER — TRAZODONE HCL 50 MG PO TABS: 25.0000 mg | ORAL_TABLET | Freq: Every evening | ORAL | Status: DC | PRN

## 2013-06-10 NOTE — Telephone Encounter (Signed)
Refilled #30 with one refill 

## 2013-06-10 NOTE — Telephone Encounter (Signed)
Okay to refill? Last filled on 05/12/13

## 2013-06-11 DIAGNOSIS — H353190 Nonexudative age-related macular degeneration, unspecified eye, stage unspecified: Secondary | ICD-10-CM | POA: Diagnosis not present

## 2013-07-26 ENCOUNTER — Encounter (INDEPENDENT_AMBULATORY_CARE_PROVIDER_SITE_OTHER): Payer: Self-pay

## 2013-07-26 ENCOUNTER — Encounter: Payer: Self-pay | Admitting: Internal Medicine

## 2013-07-26 ENCOUNTER — Ambulatory Visit (INDEPENDENT_AMBULATORY_CARE_PROVIDER_SITE_OTHER): Payer: Medicare Other | Admitting: Internal Medicine

## 2013-07-26 VITALS — BP 100/70 | HR 62 | Temp 98.3°F | Ht 62.0 in | Wt 154.8 lb

## 2013-07-26 DIAGNOSIS — I10 Essential (primary) hypertension: Secondary | ICD-10-CM

## 2013-07-26 DIAGNOSIS — R51 Headache: Secondary | ICD-10-CM

## 2013-07-26 DIAGNOSIS — Z23 Encounter for immunization: Secondary | ICD-10-CM | POA: Diagnosis not present

## 2013-07-26 DIAGNOSIS — E7800 Pure hypercholesterolemia, unspecified: Secondary | ICD-10-CM | POA: Diagnosis not present

## 2013-07-26 DIAGNOSIS — F32A Depression, unspecified: Secondary | ICD-10-CM

## 2013-07-26 DIAGNOSIS — G479 Sleep disorder, unspecified: Secondary | ICD-10-CM

## 2013-07-26 DIAGNOSIS — F329 Major depressive disorder, single episode, unspecified: Secondary | ICD-10-CM

## 2013-07-26 DIAGNOSIS — I272 Pulmonary hypertension, unspecified: Secondary | ICD-10-CM

## 2013-07-26 DIAGNOSIS — I2789 Other specified pulmonary heart diseases: Secondary | ICD-10-CM | POA: Diagnosis not present

## 2013-07-26 DIAGNOSIS — R42 Dizziness and giddiness: Secondary | ICD-10-CM

## 2013-07-26 DIAGNOSIS — Z87440 Personal history of urinary (tract) infections: Secondary | ICD-10-CM

## 2013-07-26 LAB — COMPREHENSIVE METABOLIC PANEL
AST: 17 U/L (ref 0–37)
Albumin: 3.9 g/dL (ref 3.5–5.2)
Alkaline Phosphatase: 81 U/L (ref 39–117)
BUN: 21 mg/dL (ref 6–23)
Calcium: 9.9 mg/dL (ref 8.4–10.5)
Chloride: 104 mEq/L (ref 96–112)
Glucose, Bld: 74 mg/dL (ref 70–99)
Potassium: 4.9 mEq/L (ref 3.5–5.1)
Sodium: 140 mEq/L (ref 135–145)
Total Bilirubin: 0.7 mg/dL (ref 0.3–1.2)
Total Protein: 6.7 g/dL (ref 6.0–8.3)

## 2013-07-26 LAB — SEDIMENTATION RATE: Sed Rate: 7 mm/hr (ref 0–22)

## 2013-07-26 LAB — C-REACTIVE PROTEIN: CRP: <0.5 mg/dL (ref 0.5–20.0)

## 2013-07-27 ENCOUNTER — Encounter: Payer: Self-pay | Admitting: *Deleted

## 2013-07-31 ENCOUNTER — Encounter: Payer: Self-pay | Admitting: Internal Medicine

## 2013-07-31 NOTE — Assessment & Plan Note (Signed)
Resolved

## 2013-07-31 NOTE — Assessment & Plan Note (Signed)
Currently stable.  Follow.   

## 2013-07-31 NOTE — Assessment & Plan Note (Signed)
On zoloft.  Discussed with her my desire for continued follow up with Dr Evelene Croon.  Doing better.  Getting out more.

## 2013-07-31 NOTE — Assessment & Plan Note (Signed)
Low cholesterol diet.  Continue current medication regimen.  Follow lipid panel and liver function.   

## 2013-07-31 NOTE — Progress Notes (Signed)
Subjective:    Patient ID: [name], female    DOB: [birth year], 79 y.o.   MRN: [id number]  HPI 79 year old female with past history of uterine and vulvar cancer, hypertension and hypercholesterolemia who comes in today for a scheduled follow up.  She is accompanied by her daughter.  History obtained from both of them.  Has been having issues with increased depression and increased stress.  Saw Dr Evelene Croon.  On zoloft and trazodone.  Doing better.  The previous light headedness and dizziness have resolved.  She is still having the pain behind the left eye.  Has had ENT evaluation and has seen two neurologists.  Has had MRI.  Unclear as to the exact etiology.  We had placed her on prednisone to see if this would improve her headaches.  While on the prednisone, she reported no difference in the headaches.  Temporal artery biopsy was negative.  Prednisone stopped.  On her own she started taking the prednisone again.  She is only taking one prednisone (10mg ) daily.  Reports initially some improvement, but on questioning she feels the pain is the same.  Declines referral back to neurology.  She has recently been evaluated by cardiology (Dr Sherryll Burger at [facility].  States had ECHO and holter monitor.  States everything has checked out fine.  She denies any chest pain or tightness.  Breathing stable. She is going out more.  Now going to church.       Past Medical History  Diagnosis Date  . Cancer     uterine and questionable vulvar, s/p hysterectomy  . Hypercholesterolemia   . Chronic headaches   . GERD (gastroesophageal reflux disease)   . Glaucoma   . Arthritis   . Right bundle branch block   . Reactive airway disease   . Cystocele   . Rectocele   . Hypertension     Current Outpatient Prescriptions on File Prior to Visit  Medication Sig Dispense Refill  . atorvastatin (LIPITOR) 10 MG tablet Take 10 mg by mouth 3 (three) times a week. At bedtime.  Monday, Wednesday, friday      .  Multiple Vitamins-Minerals (PRESERVISION AREDS) TABS Take 1 tablet by mouth every morning.      . Omega-3 Fatty Acids (FISH OIL CONCENTRATE PO) Take 1 capsule by mouth 2 (two) times daily. 1750mg       . omeprazole (PRILOSEC) 20 MG capsule Take 20 mg by mouth every morning.      . predniSONE (DELTASONE) 10 MG tablet Three tablets per day  90 tablet  0  . travoprost, benzalkonium, (TRAVATAN) 0.004 % ophthalmic solution Place 1 drop into both eyes at bedtime.      . traZODone (DESYREL) 50 MG tablet Take 0.5-1 tablets (25-50 mg total) by mouth at bedtime as needed for sleep.  30 tablet  1   No current facility-administered medications on file prior to visit.    Review of Systems No light headedness or dizziness.  See above.  No increased sinus congestion or drainage.  Using flonase daily.  No chest pain or tightness.  Breathing stable.  No acid reflux.  No nausea or vomiting.  No abdominal pain or cramping.  No bowel change.         Objective:   Physical Exam  Filed Vitals:   07/26/13 1048  BP: 100/70  Pulse: 62  Temp: 98.3 F (51.25 C)   79 year old female in no acute distress.   HEENT:  Nares- clear.  Oropharynx - without lesions.  No significant tenderness to palpation over the sinus/temporal region. NECK:  Supple.  Nontender.  No audible bruit.  HEART:  Appears to be regular. LUNGS:  No crackles or wheezing audible.  Respirations even and unlabored.  RADIAL PULSE:  Equal bilaterally.   ABDOMEN:  Soft, nontender.  Bowel sounds present and normal.  No audible abdominal bruit.   EXTREMITIES:  No increased edema present.  DP pulses palpable and equal bilaterally.          Assessment & Plan:  GI.  Colonoscopy 03/05/05 with diverticulosis.  Currently doing well.   CARDIOVASCULAR.  Stress test 12/18/10 revealed no ischemia.  ECHO 12/18/10 revealed EF 60% with mild RV and RA enlargement, mild LVH, mild mitral and tricuspid insufficiency with documented mild pulmonary hypertension.  Saw Dr  Lady Gary.  Felt no further w/up warranted.  Has recently been reevaluated by Dr Sherryll Burger - cardiology with [facility].  States everything has checked out fine.  Currently doing well.   KNEE PAIN.  Seeing Dr Yisroel Ramming.    HEALTH MAINTENANCE.  Up to date with her physicals.

## 2013-07-31 NOTE — Assessment & Plan Note (Signed)
Has seen Dr  Lady Gary and Dr Meredeth Ide.  Dr Meredeth Ide was to follow up with a follow up ECHO.  Apparently just had f/u ECHO with cardiology.  Still need results.

## 2013-07-31 NOTE — Assessment & Plan Note (Addendum)
Persistent intermittent headache.   Has previously had MRI.  Unrevealing.  Has seen neurology, ENT and opthalmology.  No clear etiology found.  Some form of headache present now for one year.  Previously tried on prednisone for treatment of possible temporal arteritis.  Biopsy negative.  No change previously on high dose prednisone.  She is now on 10mg  of prednisone daily.  She started herself on this medications.  Initially reported feeling better, but then stated the pain was unchanged.  Recheck esr and taper off prednisone.  Follow.  She declines referral back to neurology.

## 2013-07-31 NOTE — Assessment & Plan Note (Signed)
Blood pressure doing well on no medication.  Follow.   

## 2013-07-31 NOTE — Assessment & Plan Note (Signed)
On trazodone and zoloft.  Will taper off prednisone.  Follow.

## 2013-08-05 ENCOUNTER — Telehealth: Payer: Self-pay | Admitting: Internal Medicine

## 2013-08-05 ENCOUNTER — Other Ambulatory Visit: Payer: Self-pay | Admitting: *Deleted

## 2013-08-05 NOTE — Telephone Encounter (Signed)
Okay to refill? 

## 2013-08-05 NOTE — Telephone Encounter (Signed)
Pt needing trazodone.  States pharmacy has already sent request.

## 2013-08-06 MED ORDER — TRAZODONE HCL 50 MG PO TABS: 25.0000 mg | ORAL_TABLET | Freq: Every evening | ORAL | Status: DC | PRN

## 2013-08-06 NOTE — Telephone Encounter (Signed)
Per psych note (Dr Shelda Altes note) she last filled this.  Need to confirm with pharmacy who is refilling and when she last got it filled.

## 2013-08-06 NOTE — Telephone Encounter (Signed)
Refilled #30 with one refill of trazodone.

## 2013-08-06 NOTE — Telephone Encounter (Signed)
I spoke with pharmacist-it was last filled on 10/ & 9/4 by our office

## 2013-08-09 DIAGNOSIS — F330 Major depressive disorder, recurrent, mild: Secondary | ICD-10-CM | POA: Diagnosis not present

## 2013-08-11 ENCOUNTER — Encounter: Payer: Self-pay | Admitting: Adult Health

## 2013-08-11 ENCOUNTER — Ambulatory Visit (INDEPENDENT_AMBULATORY_CARE_PROVIDER_SITE_OTHER): Payer: Medicare Other | Admitting: Adult Health

## 2013-08-11 VITALS — BP 118/70 | HR 81 | Temp 98.1°F | Resp 16 | Wt 157.0 lb

## 2013-08-11 DIAGNOSIS — J40 Bronchitis, not specified as acute or chronic: Secondary | ICD-10-CM | POA: Diagnosis not present

## 2013-08-11 MED ORDER — ALBUTEROL SULFATE HFA 108 (90 BASE) MCG/ACT IN AERS: 2.0000 | INHALATION_SPRAY | Freq: Four times a day (QID) | RESPIRATORY_TRACT | Status: DC | PRN

## 2013-08-11 MED ORDER — AZITHROMYCIN 250 MG PO TABS: ORAL_TABLET | ORAL | Status: DC

## 2013-08-11 NOTE — Progress Notes (Signed)
  Subjective:    Patient ID: [name], female    DOB: [birth year], 79 y.o.   MRN: [id number]  HPI  Patient is a pleasant 79 year old female with history of hypertension, hyperlipidemia, pulmonary hypertension, chronic bronchitis who presents to clinic with the following symptoms: Coughing, chest pain and tightness 2/2 coughing. Symptoms started yesterday. She denies fever or shortness of breath.  Current Outpatient Prescriptions on File Prior to Visit  Medication Sig Dispense Refill  . atorvastatin (LIPITOR) 10 MG tablet Take 10 mg by mouth 3 (three) times a week. At bedtime.  Monday, Wednesday, friday      . Multiple Vitamins-Minerals (PRESERVISION AREDS) TABS Take 1 tablet by mouth every morning.      . Omega-3 Fatty Acids (FISH OIL CONCENTRATE PO) Take 1 capsule by mouth 2 (two) times daily. 1750mg       . omeprazole (PRILOSEC) 20 MG capsule Take 20 mg by mouth every morning.      . predniSONE (DELTASONE) 10 MG tablet Three tablets per day  90 tablet  0  . sertraline (ZOLOFT) 100 MG tablet Take 100 mg by mouth daily.      . travoprost, benzalkonium, (TRAVATAN) 0.004 % ophthalmic solution Place 1 drop into both eyes at bedtime.      . traZODone (DESYREL) 50 MG tablet Take 0.5-1 tablets (25-50 mg total) by mouth at bedtime as needed for sleep.  30 tablet  1  . vitamin D, CHOLECALCIFEROL, 400 UNITS tablet Take 400 Units by mouth daily.       No current facility-administered medications on file prior to visit.     Review of Systems  Constitutional: Negative for fever and chills.  Respiratory: Positive for cough.        Objective:   Physical Exam  Constitutional: She is oriented to person, place, and time. She appears well-developed and well-nourished. No distress.  Cardiovascular: Normal rate and regular rhythm.  Exam reveals no gallop and no friction rub.   No murmur heard. Pulmonary/Chest: Effort normal and breath sounds normal. No respiratory distress. She has no wheezes. She  has no rales.  Neurological: She is alert and oriented to person, place, and time.  Psychiatric: She has a normal mood and affect. Her behavior is normal. Judgment and thought content normal.   BP 118/70  Pulse 81  Temp(Src) 98.1 F (36.7 C) (Oral)  Resp 16  Wt 157 lb (71.215 kg)  SpO2 93%        Assessment & Plan:

## 2013-08-11 NOTE — Assessment & Plan Note (Signed)
Start albuterol inhaler 1-2 puffs every 6 hours as needed for shortness of breath, wheezing or chest tightness. Also start azithromycin. Over-the-counter cough suppressant. Fluids to maintain hydration. RTC if symptoms are not improved within 4-5 days or sooner if necessary.

## 2013-08-11 NOTE — Patient Instructions (Signed)
Start Azithromycin as directed.  You can try an over the counter cough medication.   Acute Bronchitis Bronchitis is inflammation of the airways that extend from the windpipe into the lungs (bronchi). The inflammation often causes mucus to develop. This leads to a cough, which is the most common symptom of bronchitis.  In acute bronchitis, the condition usually develops suddenly and goes away over time, usually in a couple weeks. Smoking, allergies, and asthma can make bronchitis worse. Repeated episodes of bronchitis may cause further lung problems.  CAUSES Acute bronchitis is most often caused by the same virus that causes a cold. The virus can spread from person to person (contagious).  SIGNS AND SYMPTOMS   Cough.   Fever.   Coughing up mucus.   Body aches.   Chest congestion.   Chills.   Shortness of breath.   Sore throat.  DIAGNOSIS  Acute bronchitis is usually diagnosed through a physical exam. Tests, such as chest X-rays, are sometimes done to rule out other conditions.  TREATMENT  Acute bronchitis usually goes away in a couple weeks. Often times, no medical treatment is necessary. Medicines are sometimes given for relief of fever or cough. Antibiotics are usually not needed but may be prescribed in certain situations. In some cases, an inhaler may be recommended to help reduce shortness of breath and control the cough. A cool mist vaporizer may also be used to help thin bronchial secretions and make it easier to clear the chest.  HOME CARE INSTRUCTIONS  Get plenty of rest.   Drink enough fluids to keep your urine clear or pale yellow (unless you have a medical condition that requires fluid restriction). Increasing fluids may help thin your secretions and will prevent dehydration.   Only take over-the-counter or prescription medicines as directed by your health care provider.   Avoid smoking and secondhand smoke. Exposure to cigarette smoke or irritating  chemicals will make bronchitis worse. If you are a smoker, consider using nicotine gum or skin patches to help control withdrawal symptoms. Quitting smoking will help your lungs heal faster.   Reduce the chances of another bout of acute bronchitis by washing your hands frequently, avoiding people with cold symptoms, and trying not to touch your hands to your mouth, nose, or eyes.   Follow up with your health care provider as directed.  SEEK MEDICAL CARE IF: Your symptoms do not improve after 1 week of treatment.  SEEK IMMEDIATE MEDICAL CARE IF:  You develop an increased fever or chills.   You have chest pain.   You have severe shortness of breath.  You have bloody sputum.   You develop dehydration.  You develop fainting.  You develop repeated vomiting.  You develop a severe headache. MAKE SURE YOU:   Understand these instructions.  Will watch your condition.  Will get help right away if you are not doing well or get worse. Document Released: 10/31/2004 Document Revised: 05/26/2013 Document Reviewed: 03/16/2013 [facility] Patient Information 2014 [place], [place].

## 2013-08-11 NOTE — Progress Notes (Signed)
Pre-visit discussion using our clinic review tool. No additional management support is needed unless otherwise documented below in the visit note.  

## 2013-08-16 NOTE — Telephone Encounter (Signed)
Pt was diagnosed with Bronchitis last week and just finished up her zpak and is still not better they were wanting to know if anything else could be called into her pharmacy.

## 2013-08-17 NOTE — Telephone Encounter (Signed)
I agree with Dr. Lorin Picket - needs to be reevaluated.

## 2013-08-17 NOTE — Telephone Encounter (Signed)
Pt coming in to see Raquel tomorrow @ 3:30.

## 2013-08-17 NOTE — Telephone Encounter (Signed)
You saw this pt.  I was going to respond and say that if she is having persistent symptoms - needs reevaluation.  Since you saw her, you can give recs or we can get her in to be seen.

## 2013-08-17 NOTE — Telephone Encounter (Signed)
Spoke with pt & she c/o chest tightness & pain due to the cough. Cough was productive yesterday. Wants to know if she could have another round of antibiotics to clear it up.

## 2013-08-18 ENCOUNTER — Encounter: Payer: Self-pay | Admitting: Adult Health

## 2013-08-18 ENCOUNTER — Ambulatory Visit (INDEPENDENT_AMBULATORY_CARE_PROVIDER_SITE_OTHER): Payer: Medicare Other | Admitting: Adult Health

## 2013-08-18 VITALS — BP 126/74 | HR 80 | Temp 99.0°F | Wt 155.0 lb

## 2013-08-18 DIAGNOSIS — J40 Bronchitis, not specified as acute or chronic: Secondary | ICD-10-CM

## 2013-08-18 MED ORDER — ANTIPYRINE-BENZOCAINE 5.4-1.4 % OT SOLN: 3.0000 [drp] | OTIC | Status: DC | PRN

## 2013-08-18 MED ORDER — AMOXICILLIN-POT CLAVULANATE 875-125 MG PO TABS: 1.0000 | ORAL_TABLET | Freq: Two times a day (BID) | ORAL | Status: DC

## 2013-08-18 MED ORDER — BENZONATATE 200 MG PO CAPS: 200.0000 mg | ORAL_CAPSULE | Freq: Two times a day (BID) | ORAL | Status: DC | PRN

## 2013-08-18 NOTE — Progress Notes (Signed)
  Subjective:    Patient ID: [name], female    DOB: [birth year], 79 y.o.   MRN: [id number]  HPI  Patient is a pleasant 79 year old female with history of hypertension, hyperlipidemia, pulmonary hypertension, chronic bronchitis who presents to clinic with the following symptoms: Coughing, chest discomfort and tightness 2/2 coughing. She was seen in clinic a week ago and given a z pac. She has noticed some improvement but still having the coughing. She is allergic to codeine so she cannot take this for cough.    Review of Systems  Constitutional: Negative for fever and chills.  HENT: Positive for ear pain and postnasal drip.   Respiratory: Positive for cough and chest tightness. Negative for shortness of breath and wheezing.         Objective:   Physical Exam  Constitutional: She is oriented to person, place, and time. She appears well-developed and well-nourished. No distress.  HENT:  Head: Normocephalic and atraumatic.  Left Ear: External ear normal.  Right ear canal erythematous. She has an abrasion of the canal possibly 2/2 scratching it with her nails. Pharynx is slightly erythematous. She has some drainage posterior pharynx  Cardiovascular: Normal rate, regular rhythm and normal heart sounds.  Exam reveals no gallop.   No murmur heard. Pulmonary/Chest: Effort normal and breath sounds normal. No respiratory distress. She has no wheezes. She has no rales.  Neurological: She is alert and oriented to person, place, and time.  Psychiatric: She has a normal mood and affect. Her behavior is normal. Judgment and thought content normal.          Assessment & Plan:

## 2013-08-18 NOTE — Patient Instructions (Signed)
  Start the Augmentin twice daily for 10 days.  Tessalon for cough. You may take 1 capsule twice a day as needed.  If you are not improved by next week, I will send you for an xray

## 2013-08-18 NOTE — Assessment & Plan Note (Signed)
Still coughing but improved. Tessalon ordered. Augmentin also ordered.

## 2013-08-24 DIAGNOSIS — F330 Major depressive disorder, recurrent, mild: Secondary | ICD-10-CM | POA: Diagnosis not present

## 2013-08-25 ENCOUNTER — Telehealth: Payer: Self-pay | Admitting: *Deleted

## 2013-08-25 ENCOUNTER — Ambulatory Visit (INDEPENDENT_AMBULATORY_CARE_PROVIDER_SITE_OTHER): Payer: Medicare Other | Admitting: *Deleted

## 2013-08-25 DIAGNOSIS — H6121 Impacted cerumen, right ear: Secondary | ICD-10-CM

## 2013-08-25 DIAGNOSIS — H6120 Impacted cerumen, unspecified ear: Secondary | ICD-10-CM | POA: Diagnosis not present

## 2013-08-25 NOTE — Telephone Encounter (Signed)
Pt presented to clinic for right ear irrigation. Visually inspected left ear and abrasions noted in ear canal, noted on your office visit from last week. Pt continues to have ear pain, has been using Auralgan drops as directed. Denies scratching her ear or sticking anything in it. What else does she need to do? States cough has improved, still taking antibiotic.

## 2013-08-25 NOTE — Progress Notes (Deleted)
  Subjective:    Patient ID: [name], female    DOB: [birth year], 79 y.o.   MRN: [id number]  HPI    Review of Systems     Objective:   Physical Exam        Assessment & Plan:

## 2013-08-25 NOTE — Progress Notes (Signed)
Pt presented to clinic for right ear irrigation per Orville Govern, NP. Upon visual inspection, right ear canal occluded with cerumen. Right ear was irrigated with water without difficulty. Pt tolerated procedure without complications. Able to see ear drum after irrigation, no redness noted. Left ear has red abrasions visible and pt complaining of left ear pain. Denies scratching ear canal or putting anything in her ear to cause abrasion. This was noted in Raquel's office visit note from last week. Pt has been using Auralgan drops as instructed but pain persists. Raquel notified. Advised pt she would be called with any further instructions.

## 2013-08-26 ENCOUNTER — Other Ambulatory Visit: Payer: Self-pay | Admitting: Adult Health

## 2013-08-26 DIAGNOSIS — S00412S Abrasion of left ear, sequela: Secondary | ICD-10-CM

## 2013-08-26 NOTE — Telephone Encounter (Signed)
Referral to ENT °

## 2013-08-26 NOTE — Telephone Encounter (Signed)
Pt has an apt tomorrow with Dr. Wardell Heath @ [facility]. Pts caregiver, Lupita Leash is aware.

## 2013-08-26 NOTE — Telephone Encounter (Signed)
Pt notified. States she has seen and liked Dr. Willeen Cass in the past

## 2013-08-27 DIAGNOSIS — J310 Chronic rhinitis: Secondary | ICD-10-CM | POA: Diagnosis not present

## 2013-08-27 DIAGNOSIS — M26609 Unspecified temporomandibular joint disorder, unspecified side: Secondary | ICD-10-CM | POA: Diagnosis not present

## 2013-08-27 DIAGNOSIS — H9209 Otalgia, unspecified ear: Secondary | ICD-10-CM | POA: Diagnosis not present

## 2013-09-17 DIAGNOSIS — F0280 Dementia in other diseases classified elsewhere without behavioral disturbance: Secondary | ICD-10-CM | POA: Diagnosis not present

## 2013-09-17 DIAGNOSIS — F330 Major depressive disorder, recurrent, mild: Secondary | ICD-10-CM | POA: Diagnosis not present

## 2013-10-10 ENCOUNTER — Emergency Department (HOSPITAL_COMMUNITY): Payer: Medicare Other

## 2013-10-10 ENCOUNTER — Inpatient Hospital Stay (HOSPITAL_COMMUNITY)
Admission: EM | Admit: 2013-10-10 | Discharge: 2013-10-13 | DRG: 125 | Disposition: A | Discharge status: Skilled Nursing Facility | Payer: Medicare Other | Attending: Internal Medicine | Admitting: Internal Medicine

## 2013-10-10 ENCOUNTER — Encounter (HOSPITAL_COMMUNITY): Payer: Self-pay | Admitting: Emergency Medicine

## 2013-10-10 DIAGNOSIS — I369 Nonrheumatic tricuspid valve disorder, unspecified: Secondary | ICD-10-CM | POA: Diagnosis not present

## 2013-10-10 DIAGNOSIS — F411 Generalized anxiety disorder: Secondary | ICD-10-CM | POA: Diagnosis present

## 2013-10-10 DIAGNOSIS — H3530 Unspecified macular degeneration: Secondary | ICD-10-CM | POA: Diagnosis present

## 2013-10-10 DIAGNOSIS — F419 Anxiety disorder, unspecified: Secondary | ICD-10-CM

## 2013-10-10 DIAGNOSIS — H5462 Unqualified visual loss, left eye, normal vision right eye: Secondary | ICD-10-CM | POA: Diagnosis present

## 2013-10-10 DIAGNOSIS — R0609 Other forms of dyspnea: Secondary | ICD-10-CM | POA: Diagnosis present

## 2013-10-10 DIAGNOSIS — I10 Essential (primary) hypertension: Secondary | ICD-10-CM | POA: Diagnosis present

## 2013-10-10 DIAGNOSIS — H409 Unspecified glaucoma: Secondary | ICD-10-CM | POA: Diagnosis not present

## 2013-10-10 DIAGNOSIS — I270 Primary pulmonary hypertension: Secondary | ICD-10-CM | POA: Diagnosis not present

## 2013-10-10 DIAGNOSIS — R51 Headache: Secondary | ICD-10-CM

## 2013-10-10 DIAGNOSIS — R0989 Other specified symptoms and signs involving the circulatory and respiratory systems: Secondary | ICD-10-CM | POA: Diagnosis present

## 2013-10-10 DIAGNOSIS — J40 Bronchitis, not specified as acute or chronic: Secondary | ICD-10-CM

## 2013-10-10 DIAGNOSIS — H5440 Blindness, one eye, unspecified eye: Secondary | ICD-10-CM | POA: Diagnosis present

## 2013-10-10 DIAGNOSIS — Z87440 Personal history of urinary (tract) infections: Secondary | ICD-10-CM

## 2013-10-10 DIAGNOSIS — H5340 Unspecified visual field defects: Secondary | ICD-10-CM | POA: Diagnosis not present

## 2013-10-10 DIAGNOSIS — H547 Unspecified visual loss: Secondary | ICD-10-CM | POA: Diagnosis not present

## 2013-10-10 DIAGNOSIS — I2789 Other specified pulmonary heart diseases: Secondary | ICD-10-CM | POA: Diagnosis present

## 2013-10-10 DIAGNOSIS — E7800 Pure hypercholesterolemia, unspecified: Secondary | ICD-10-CM | POA: Diagnosis present

## 2013-10-10 DIAGNOSIS — H5460 Unqualified visual loss, one eye, unspecified: Secondary | ICD-10-CM | POA: Diagnosis not present

## 2013-10-10 DIAGNOSIS — F32A Depression, unspecified: Secondary | ICD-10-CM

## 2013-10-10 DIAGNOSIS — R0789 Other chest pain: Secondary | ICD-10-CM | POA: Diagnosis present

## 2013-10-10 DIAGNOSIS — F40298 Other specified phobia: Secondary | ICD-10-CM | POA: Diagnosis present

## 2013-10-10 DIAGNOSIS — R42 Dizziness and giddiness: Secondary | ICD-10-CM | POA: Diagnosis not present

## 2013-10-10 DIAGNOSIS — F329 Major depressive disorder, single episode, unspecified: Secondary | ICD-10-CM

## 2013-10-10 DIAGNOSIS — R079 Chest pain, unspecified: Secondary | ICD-10-CM | POA: Diagnosis present

## 2013-10-10 DIAGNOSIS — K219 Gastro-esophageal reflux disease without esophagitis: Secondary | ICD-10-CM | POA: Diagnosis present

## 2013-10-10 DIAGNOSIS — Z79899 Other long term (current) drug therapy: Secondary | ICD-10-CM

## 2013-10-10 DIAGNOSIS — G479 Sleep disorder, unspecified: Secondary | ICD-10-CM

## 2013-10-10 DIAGNOSIS — R0602 Shortness of breath: Secondary | ICD-10-CM | POA: Diagnosis not present

## 2013-10-10 DIAGNOSIS — E119 Type 2 diabetes mellitus without complications: Secondary | ICD-10-CM | POA: Diagnosis present

## 2013-10-10 DIAGNOSIS — I272 Pulmonary hypertension, unspecified: Secondary | ICD-10-CM | POA: Diagnosis present

## 2013-10-10 DIAGNOSIS — H4011X0 Primary open-angle glaucoma, stage unspecified: Secondary | ICD-10-CM | POA: Diagnosis not present

## 2013-10-10 DIAGNOSIS — Z5189 Encounter for other specified aftercare: Secondary | ICD-10-CM | POA: Diagnosis not present

## 2013-10-10 DIAGNOSIS — G4486 Cervicogenic headache: Secondary | ICD-10-CM | POA: Diagnosis present

## 2013-10-10 HISTORY — DX: Unspecified macular degeneration: H35.30

## 2013-10-10 LAB — COMPREHENSIVE METABOLIC PANEL
ALBUMIN: 3.4 g/dL — AB (ref 3.5–5.2)
ALK PHOS: 132 U/L — AB (ref 39–117)
ALT: 8 U/L (ref 0–35)
AST: 13 U/L (ref 0–37)
BUN: 18 mg/dL (ref 6–23)
CHLORIDE: 104 meq/L (ref 96–112)
CO2: 24 mEq/L (ref 19–32)
Calcium: 9.5 mg/dL (ref 8.4–10.5)
Creatinine, Ser: 0.82 mg/dL (ref 0.50–1.10)
GFR calc Af Amer: 74 mL/min — ABNORMAL LOW (ref >90)
GFR calc non Af Amer: 63 mL/min — ABNORMAL LOW (ref >90)
GLUCOSE: 112 mg/dL — AB (ref 70–99)
POTASSIUM: 4.1 meq/L (ref 3.7–5.3)
SODIUM: 142 meq/L (ref 137–147)
Total Protein: 7.3 g/dL (ref 6.0–8.3)

## 2013-10-10 LAB — CBC WITH DIFFERENTIAL/PLATELET
Basophils Absolute: 0.1 10*3/uL (ref 0.0–0.1)
Basophils Relative: 1 % (ref 0–1)
Eosinophils Absolute: 0.2 10*3/uL (ref 0.0–0.7)
Eosinophils Relative: 3 % (ref 0–5)
HEMATOCRIT: 36.1 % (ref 36.0–46.0)
Hemoglobin: 11.9 g/dL — ABNORMAL LOW (ref 12.0–15.0)
LYMPHS ABS: 2.4 10*3/uL (ref 0.7–4.0)
Lymphocytes Relative: 33 % (ref 12–46)
MCH: 30.8 pg (ref 26.0–34.0)
MCHC: 33 g/dL (ref 30.0–36.0)
MCV: 93.5 fL (ref 78.0–100.0)
Monocytes Absolute: 0.5 10*3/uL (ref 0.1–1.0)
Monocytes Relative: 7 % (ref 3–12)
NEUTROS ABS: 4.2 10*3/uL (ref 1.7–7.7)
NEUTROS PCT: 57 % (ref 43–77)
PLATELETS: 354 10*3/uL (ref 150–400)
RBC: 3.86 MIL/uL — ABNORMAL LOW (ref 3.87–5.11)
RDW: 13.1 % (ref 11.5–15.5)
WBC: 7.3 10*3/uL (ref 4.0–10.5)

## 2013-10-10 LAB — POCT I-STAT TROPONIN I: Troponin i, poc: 0 ng/mL (ref 0.00–0.08)

## 2013-10-10 LAB — D-DIMER, QUANTITATIVE (NOT AT ARMC): D DIMER QUANT: 0.82 ug{FEU}/mL — AB (ref 0.00–0.48)

## 2013-10-10 MED ORDER — ASPIRIN 325 MG PO TABS
325.0000 mg | ORAL_TABLET | Freq: Once | ORAL | Status: AC
  Administered 2013-10-10: 325 mg via ORAL
  Filled 2013-10-10: qty 1

## 2013-10-10 MED ORDER — TETRACAINE HCL 0.5 % OP SOLN
2.0000 [drp] | Freq: Once | OPHTHALMIC | Status: DC
  Filled 2013-10-10 (×2): qty 2

## 2013-10-10 MED ORDER — IOHEXOL 350 MG/ML SOLN
100.0000 mL | Freq: Once | INTRAVENOUS | Status: AC | PRN
  Administered 2013-10-10: 100 mL via INTRAVENOUS

## 2013-10-10 MED ORDER — SODIUM CHLORIDE 0.9 % IV BOLUS (SEPSIS)
500.0000 mL | Freq: Once | INTRAVENOUS | Status: AC
Start: 2013-10-10 — End: 2013-10-11
  Administered 2013-10-10: 500 mL via INTRAVENOUS

## 2013-10-10 NOTE — ED Notes (Signed)
Pt reporting cp. Rates pain 5/10, describes as a tightness, denies any sob, n/v.

## 2013-10-10 NOTE — ED Notes (Signed)
Pt reports being blind in right eye due to macular degeneration, was sitting at church and began having blurred vision to left eye followed by blindness to left eye as well. Reports that she does have macular degeneration to left eye also. No other neuro deficits noted, grips are equal, no arm drift, no speech deficits.

## 2013-10-10 NOTE — ED Provider Notes (Signed)
CSN: [id number]     Arrival date & time 10/10/13  1857 History   First MD Initiated Contact with Patient 10/10/13 2014     Chief Complaint  Patient presents with  . Loss of Vision   (Consider location/radiation/quality/duration/timing/severity/associated sxs/prior Treatment) The history is provided by the patient. No language interpreter was used.  [name] is an 80 year old female with past medical history of glaucoma, right bundle branch block, hypertension, macular degeneration presenting to emergency department with sudden loss of vision to the left eye. 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 Patient reports that while she was at church this morning she noticed that a curtain coming down sensation to her left eye occurred with sudden loss of vision. Patient reports she is unable to see out of the Center of her eyes. Reports she's still able to see in her peripheral vision. Patient reports she's been experiencing a burning sensation to the left eye with intermittent tearing. Reports that she does take ophthalmic drops for her glaucoma control. Reports that she sees Dr. Dartha Lodge at [facility]-reported that she has appointment with the physician tomorrow for her four-month checkup. Denied headache, chest pain, shortness of breath, numbness, tingling, facial drooping, slurred speech, disorientation, confusion. PCP Dr. Einar Pheasant  Past Medical History  Diagnosis Date  . Cancer     uterine and questionable vulvar, s/p hysterectomy  . Hypercholesterolemia   . Chronic headaches   . GERD (gastroesophageal reflux disease)   . Glaucoma   . Arthritis   . Right bundle branch block   . Reactive airway disease   . Cystocele   . Rectocele   . Hypertension   . Macular degeneration    Past Surgical History  Procedure Laterality Date  . Cholecystectomy    . Appendectomy    . Abdominal hysterectomy    . Vulvectomy      Family History  Problem Relation Age of Onset  . Heart disease Mother   . Cancer Father     leukemia  . Heart disease Brother     x2  . Cancer Daughter     breast   History  Substance Use Topics  . Smoking status: Never Smoker   . Smokeless tobacco: Never Used  . Alcohol Use: No   OB History   Grav Para Term Preterm Abortions TAB SAB Ect Mult Living                 Review of Systems  Constitutional: Negative for fever and chills.  HENT: Negative for trouble swallowing.   Eyes: Positive for pain and visual disturbance.  Respiratory: Negative for chest tightness and shortness of breath.   Cardiovascular: Negative for chest pain.  Neurological: Negative for speech difficulty, weakness, numbness and headaches.  All other systems reviewed and are negative.    Allergies  Ambien and Codeine  Home Medications   Current Outpatient Rx  Name  Route  Sig  Dispense  Refill  . Multiple Vitamin (MULTIVITAMIN) tablet   Oral   Take 1 tablet by mouth daily.         . Multiple Vitamins-Minerals (PRESERVISION AREDS) TABS   Oral   Take 1 tablet by mouth every morning.         . Omega-3 Fatty Acids (FISH OIL CONCENTRATE PO)   Oral   Take 1 capsule by mouth 2 (two) times daily. 1750mg          .  omeprazole (PRILOSEC) 20 MG capsule   Oral   Take 20 mg by mouth every morning.         . sertraline (ZOLOFT) 100 MG tablet   Oral   Take 100 mg by mouth daily.         . travoprost, benzalkonium, (TRAVATAN) 0.004 % ophthalmic solution   Both Eyes   Place 1 drop into both eyes at bedtime.         . vitamin D, CHOLECALCIFEROL, 400 UNITS tablet   Oral   Take 400 Units by mouth daily.          BP 158/71  Pulse 61  Temp(Src) 98.3 F (36.8 C) (Oral)  Resp 19  Ht  (1.575 m)  Wt 155 lb (70.308 kg)  BMI 28.34 kg/m2  SpO2 96% Physical Exam  Nursing note and vitals reviewed. Constitutional: She is oriented to person, place, and time. She appears  well-developed and well-nourished. No distress.  HENT:  Head: Normocephalic and atraumatic.  Eyes: Conjunctivae and EOM are normal. Right eye exhibits no discharge and no exudate. Left eye exhibits no discharge and no exudate. Right conjunctiva is not injected. Right conjunctiva has no hemorrhage. Left conjunctiva is not injected. Left conjunctiva has no hemorrhage.  Fundoscopic exam:      The right eye shows no AV nicking, no exudate and no hemorrhage.       The left eye shows no AV nicking, no exudate and no hemorrhage.  Negative swelling, erythema, inflammation, injection, abnormalities noted to the left eye or orbit Decreased pupil reaction to the right eye PERRLA intact left eye Loss of central vision to the left eye, peripheral vision present but minimal  Neck: Normal range of motion. Neck supple.  Cardiovascular: Normal rate, regular rhythm and normal heart sounds.  Exam reveals no friction rub.   No murmur heard. Pulses:      Radial pulses are 2+ on the right side, and 2+ on the left side.  Pulmonary/Chest: Effort normal and breath sounds normal. No respiratory distress. She has no wheezes. She has no rales.  Musculoskeletal: Normal range of motion.  Lymphadenopathy:    She has no cervical adenopathy.  Neurological: She is alert and oriented to person, place, and time. No cranial nerve deficit. She exhibits normal muscle tone. Coordination normal.  Cranial nerves III-XII grossly intact Strength 5+/5+ to upper and lower extremities bilaterally with resistance applied, equal distribution noted Negative facial drooping Negative ataxia with motion  Skin: Skin is warm and dry. She is not diaphoretic.  Psychiatric: She has a normal mood and affect. Her behavior is normal. Thought content normal.    ED Course  Procedures (including critical care time)  Tonopen not calibrating.   9:13 PM While in room assessing patient - patient's continuously started to have PVCs with associated  chest pain and shortness of breath that started in the ED setting. EKG ordered and similar findings on previous one from 04/29/2013 that was performed at [facility] - patient had similar EKG findings. PVCs noted. Discussed case with Dr. Kirtland Bouchard. Ward.  9:44 PM Dr. Elesa Massed at bedside assessing patient.   9:55 PM This provider spoke with Dr. Burgess Estelle, ophthalmologist, recommended that since patient has an ophthalmologist to call their services.   10:19 PM This provider spoke with Dr. Marylynn Pearson, from [facility] - discussed case, history and presentation, and physical exam. Recommended on-call ophthalmologist to see and assess patient.  12:21 AM This provider spoke with Dr. Burgess Estelle,  ophthalmologist, regarding case, history and presentation.  12:33 AM This provider spoke with Dr. Dossie Arbour. Discussed case, history, presentation, labs, and imaging. Recommended MRA/MR of brain to be performed to rule out TIA or occlusion. Patient to be admitted to Telemetry.    Results for orders placed during the hospital encounter of 10/10/13  CBC WITH DIFFERENTIAL      Result Value Range   WBC 7.3  4.0 - 10.5 K/uL   RBC 3.86 (*) 3.87 - 5.11 MIL/uL   Hemoglobin 11.9 (*) 12.0 - 15.0 g/dL   HCT 36.1  36.0 - 46.0 %   MCV 93.5  78.0 - 100.0 fL   MCH 30.8  26.0 - 34.0 pg   MCHC 33.0  30.0 - 36.0 g/dL   RDW 13.1  11.5 - 15.5 %   Platelets 354  150 - 400 K/uL   Neutrophils Relative % 57  43 - 77 %   Neutro Abs 4.2  1.7 - 7.7 K/uL   Lymphocytes Relative 33  12 - 46 %   Lymphs Abs 2.4  0.7 - 4.0 K/uL   Monocytes Relative 7  3 - 12 %   Monocytes Absolute 0.5  0.1 - 1.0 K/uL   Eosinophils Relative 3  0 - 5 %   Eosinophils Absolute 0.2  0.0 - 0.7 K/uL   Basophils Relative 1  0 - 1 %   Basophils Absolute 0.1  0.0 - 0.1 K/uL  COMPREHENSIVE METABOLIC PANEL      Result Value Range   Sodium 142  137 - 147 mEq/L   Potassium 4.1  3.7 - 5.3 mEq/L   Chloride 104  96 - 112 mEq/L   CO2 24  19 - 32 mEq/L   Glucose, Bld  112 (*) 70 - 99 mg/dL   BUN 18  6 - 23 mg/dL   Creatinine, Ser 0.82  0.50 - 1.10 mg/dL   Calcium 9.5  8.4 - 10.5 mg/dL   Total Protein 7.3  6.0 - 8.3 g/dL   Albumin 3.4 (*) 3.5 - 5.2 g/dL   AST 13  0 - 37 U/L   ALT 8  0 - 35 U/L   Alkaline Phosphatase 132 (*) 39 - 117 U/L   Total Bilirubin <0.2 (*) 0.3 - 1.2 mg/dL   GFR calc non Af Amer 63 (*) >90 mL/min   GFR calc Af Amer 74 (*) >90 mL/min  D-DIMER, QUANTITATIVE      Result Value Range   D-Dimer, Quant 0.82 (*) 0.00 - 0.48 ug/mL-FEU  POCT I-STAT TROPONIN I      Result Value Range   Troponin i, poc 0.00  0.00 - 0.08 ng/mL   Comment 3            Ct Angio Chest Pe W/cm &/or Wo Cm  10/10/2013   CLINICAL DATA:  Shortness of breath and elevated D-dimer.  EXAM: CT ANGIOGRAPHY CHEST WITH CONTRAST  TECHNIQUE: Multidetector CT imaging of the chest was performed using the standard protocol during bolus administration of intravenous contrast. Multiplanar CT image reconstructions including MIPs were obtained to evaluate the vascular anatomy.  CONTRAST:  164mL OMNIPAQUE IOHEXOL 350 MG/ML SOLN  COMPARISON:  None.  FINDINGS: THORACIC INLET/BODY WALL:  No acute abnormality.  MEDIASTINUM:  Mild cardiomegaly. No pericardial effusion. Diffuse coronary artery atherosclerosis. No evidence of pulmonary embolism. There is respiratory motion at the bases which limits assessment at this level. No evidence of acute aortic abnormality. No lymphadenopathy.  LUNG WINDOWS:  No  consolidation. Mild dependent atelectasis. No effusion. No suspicious pulmonary nodule.  UPPER ABDOMEN:  No acute findings.  OSSEOUS:  No acute fracture. No suspicious lytic or blastic lesions. Degenerative disc disease throughout the thoracic spine with exaggerated kyphosis.  Review of the MIP images confirms the above findings.  IMPRESSION: No evidence of pulmonary embolism or other acute intrathoracic disease.   Electronically Signed   By: Jorje Guild M.D.   On: 10/10/2013 23:56   Dg Chest Port  1 View  10/10/2013   CLINICAL DATA:  Shortness of breath left-sided chest pain  EXAM: PORTABLE CHEST - 1 VIEW  COMPARISON:  09/10/2012  FINDINGS: Heart size is normal. Vascular pattern is normal. Limited inspiratory effect. Mild bilateral lower lobe atelectasis.  IMPRESSION: No acute abnormality.   Electronically Signed   By: Skipper Cliche M.D.   On: 10/10/2013 22:04    Labs Review Labs Reviewed - No data to display Imaging Review No results found.  EKG Interpretation   None       MDM  No diagnosis found.  Diagnoses that have been ruled out:  None  Diagnoses that are still under consideration:  None  Final diagnoses:  Loss of vision  Chest pain  Shortness of breath    Patient presenting to emergency department with loss of vision to the left eye that occurred suddenly this morning while patient was in church. Reported that it was a curtain coming down sensation. Patient has history of macular degeneration localized to her right eye that has led to complete blindness in her right eye. Alert and oriented. GCS 15. Heart rate and rhythm normal. Radial and DP pulses 2+ bilaterally. Full range of motion to upper and lower extremities bilaterally. Strength intact with equal distribution. Sensation intact. Negative injection, inflammation, swelling noted to the left eye. Sclera negative injection. Pupils reactive-mildly decreased the right eye. Central vision lost, patient has peripheral vision to the left side. Unremarkable funduscopic exam to the left eye. While in ED setting patient began to express chest discomfort localized to the Center the chest and shortness of breath. Multiple PVCs noted on cardiac monitoring machine - EKG was performed with multiple PVCs. First troponin negative elevation. CBC negative findings. CMP negative findings. D-dimer elevated at 0.82. Chest x-ray negative for acute cardiopulmonary disease. CT angio ordered. 12:27 AM Negative findings to PE on the CT angio.   This provider spoke with Dr. Marlowe Sax from [facility] regarding case - patient to be admitted to the hospital regarding loss of vision and chest pain episode while in ED setting with multiple PVCs noted on EKG, sinus rhythm noted. Possible TIA versus CVA to rule out regarding loss of vision - could be macular degeneration issues, possible hemorrhage due to macular degeneration, possible retinal artery occlusion. Discussed case with ophthalmology - ophthalmology to consult. MRI/MRA recommended by admitting physician - image order and results pending. Discussed labs, imaging and plan for admission with patient. Patient and family agreed to plan. Patient stable for transfer. Admitted to Telemetry as inpatient.    [employer], PA-C 10/13/13 0009

## 2013-10-10 NOTE — ED Notes (Signed)
MD at Bedside.

## 2013-10-10 NOTE — ED Notes (Signed)
Elyse Jarvis [phone] (daughter)

## 2013-10-10 NOTE — ED Notes (Addendum)
Pt states she was at church and around 6:15pm pt states she had a weird feeling in her left eye and all of a sudden she couldn't see anything. Pt already is blind in her right eye from macular degeneration. Pt states this feels like when she lost vision in her right eye. Per pt's husband pt has been going to the doctor to save the vision in her left eye. Pt denies any sudden numbness, tingling, or any other neuro deficits. Pt has some peripheral vision, no central vision.

## 2013-10-10 NOTE — ED Provider Notes (Signed)
Medical screening examination/treatment/procedure(s) were conducted as a shared visit with non-physician practitioner(s) and myself.  I personally evaluated the patient during the encounter.  EKG Interpretation    Date/Time:  Sunday October 10 2013 19:24:28 EST Ventricular Rate:  70 PR Interval:  170 QRS Duration: 88 QT Interval:  354 QTC Calculation: 382 R Axis:   33 Text Interpretation:  Sinus rhythm Multiform ventricular premature complexes Low voltage, precordial leads Borderline T abnormalities, anterior leads No significant change since last tracing Confirmed by Tashunda Vandezande  DO, Jareli Highland ([id number]) on 10/10/2013 9:33:13 PM            Patient is an 80 year old female with a history of hypertension, hyperlipidemia, macular degeneration with almost complete blindness of her right eye 2 presented to the emergency department with sudden onset vision loss in the left eye that started at 6:30 PM. She reports her symptoms were like a car and shade coming down. She is not having any eye pain. No history of injury to the eye. She has had chronic headaches. No vomiting. While in the emergency department, patient began having multiple PVCs and began complaining of chest pain with shortness of breath. She denies a prior history of cardiac disease. EKG shows no significant changes from prior. We'll obtain cardiac labs. Will discuss with ophthalmology on call for evaluation. On my funduscopic exam, patient's retina appears normal and is not pale.  Her pupils are equal reactive extraocular movements are intact. There is no hyphema, conjunctival irritation. This may be central retinal artery occlusion although given her normal funduscopic exam I feel this is less likely versus retinal detachment versus severe macular degeneration.  Patient will need admission.  [place], DO 10/10/13 2323

## 2013-10-11 ENCOUNTER — Inpatient Hospital Stay (HOSPITAL_COMMUNITY): Payer: Medicare Other

## 2013-10-11 DIAGNOSIS — R0609 Other forms of dyspnea: Secondary | ICD-10-CM | POA: Insufficient documentation

## 2013-10-11 DIAGNOSIS — R51 Headache: Secondary | ICD-10-CM | POA: Diagnosis not present

## 2013-10-11 DIAGNOSIS — I10 Essential (primary) hypertension: Secondary | ICD-10-CM | POA: Diagnosis not present

## 2013-10-11 DIAGNOSIS — I369 Nonrheumatic tricuspid valve disorder, unspecified: Secondary | ICD-10-CM

## 2013-10-11 DIAGNOSIS — H547 Unspecified visual loss: Secondary | ICD-10-CM | POA: Insufficient documentation

## 2013-10-11 DIAGNOSIS — F419 Anxiety disorder, unspecified: Secondary | ICD-10-CM | POA: Insufficient documentation

## 2013-10-11 DIAGNOSIS — R42 Dizziness and giddiness: Secondary | ICD-10-CM | POA: Diagnosis not present

## 2013-10-11 DIAGNOSIS — R079 Chest pain, unspecified: Secondary | ICD-10-CM | POA: Diagnosis present

## 2013-10-11 DIAGNOSIS — H5462 Unqualified visual loss, left eye, normal vision right eye: Secondary | ICD-10-CM | POA: Diagnosis present

## 2013-10-11 LAB — COMPREHENSIVE METABOLIC PANEL
ALK PHOS: 124 U/L — AB (ref 39–117)
ALT: 7 U/L (ref 0–35)
AST: 11 U/L (ref 0–37)
Albumin: 3.1 g/dL — ABNORMAL LOW (ref 3.5–5.2)
BUN: 14 mg/dL (ref 6–23)
CALCIUM: 9 mg/dL (ref 8.4–10.5)
CO2: 25 mEq/L (ref 19–32)
Chloride: 106 mEq/L (ref 96–112)
Creatinine, Ser: 0.8 mg/dL (ref 0.50–1.10)
GFR calc Af Amer: 76 mL/min — ABNORMAL LOW (ref >90)
GFR calc non Af Amer: 65 mL/min — ABNORMAL LOW (ref >90)
GLUCOSE: 101 mg/dL — AB (ref 70–99)
POTASSIUM: 4.3 meq/L (ref 3.7–5.3)
Sodium: 142 mEq/L (ref 137–147)
TOTAL PROTEIN: 6.8 g/dL (ref 6.0–8.3)
Total Bilirubin: 0.3 mg/dL (ref 0.3–1.2)

## 2013-10-11 LAB — CBC WITH DIFFERENTIAL/PLATELET
BASOS ABS: 0 10*3/uL (ref 0.0–0.1)
Basophils Relative: 0 % (ref 0–1)
EOS ABS: 0.2 10*3/uL (ref 0.0–0.7)
EOS PCT: 2 % (ref 0–5)
HCT: 35.3 % — ABNORMAL LOW (ref 36.0–46.0)
Hemoglobin: 11.5 g/dL — ABNORMAL LOW (ref 12.0–15.0)
LYMPHS ABS: 1.5 10*3/uL (ref 0.7–4.0)
LYMPHS PCT: 19 % (ref 12–46)
MCH: 30.4 pg (ref 26.0–34.0)
MCHC: 32.6 g/dL (ref 30.0–36.0)
MCV: 93.4 fL (ref 78.0–100.0)
Monocytes Absolute: 0.6 10*3/uL (ref 0.1–1.0)
Monocytes Relative: 8 % (ref 3–12)
NEUTROS PCT: 71 % (ref 43–77)
Neutro Abs: 5.5 10*3/uL (ref 1.7–7.7)
Platelets: 319 10*3/uL (ref 150–400)
RBC: 3.78 MIL/uL — AB (ref 3.87–5.11)
RDW: 13 % (ref 11.5–15.5)
WBC: 7.7 10*3/uL (ref 4.0–10.5)

## 2013-10-11 LAB — LIPID PANEL
Cholesterol: 192 mg/dL (ref 0–200)
HDL: 62 mg/dL (ref >39)
LDL Cholesterol: 115 mg/dL — ABNORMAL HIGH (ref 0–99)
Total CHOL/HDL Ratio: 3.1 RATIO
Triglycerides: 75 mg/dL (ref <150)
VLDL: 15 mg/dL (ref 0–40)

## 2013-10-11 LAB — GLUCOSE, CAPILLARY
GLUCOSE-CAPILLARY: 97 mg/dL (ref 70–99)
Glucose-Capillary: 104 mg/dL — ABNORMAL HIGH (ref 70–99)
Glucose-Capillary: 127 mg/dL — ABNORMAL HIGH (ref 70–99)

## 2013-10-11 LAB — PROTIME-INR
INR: 1.01 (ref 0.00–1.49)
Prothrombin Time: 13.1 seconds (ref 11.6–15.2)

## 2013-10-11 LAB — HEMOGLOBIN A1C
Hgb A1c MFr Bld: 6.3 % — ABNORMAL HIGH (ref <5.7)
Mean Plasma Glucose: 134 mg/dL — ABNORMAL HIGH (ref <117)

## 2013-10-11 MED ORDER — ASPIRIN 325 MG PO TABS
325.0000 mg | ORAL_TABLET | Freq: Every day | ORAL | Status: DC
  Administered 2013-10-11 – 2013-10-13 (×3): 325 mg via ORAL
  Filled 2013-10-11 (×3): qty 1

## 2013-10-11 MED ORDER — PANTOPRAZOLE SODIUM 40 MG PO TBEC
40.0000 mg | DELAYED_RELEASE_TABLET | Freq: Every day | ORAL | Status: DC
  Administered 2013-10-11 – 2013-10-13 (×3): 40 mg via ORAL
  Filled 2013-10-11 (×3): qty 1

## 2013-10-11 MED ORDER — LATANOPROST 0.005 % OP SOLN
1.0000 [drp] | Freq: Every day | OPHTHALMIC | Status: DC
  Administered 2013-10-11 – 2013-10-12 (×2): 1 [drp] via OPHTHALMIC
  Filled 2013-10-11: qty 2.5

## 2013-10-11 MED ORDER — HEPARIN SODIUM (PORCINE) 5000 UNIT/ML IJ SOLN
5000.0000 [IU] | Freq: Three times a day (TID) | INTRAMUSCULAR | Status: DC
  Administered 2013-10-11 – 2013-10-12 (×6): 5000 [IU] via SUBCUTANEOUS
  Filled 2013-10-11 (×10): qty 1

## 2013-10-11 MED ORDER — BRINZOLAMIDE 1 % OP SUSP
1.0000 [drp] | Freq: Three times a day (TID) | OPHTHALMIC | Status: DC
  Administered 2013-10-11 – 2013-10-13 (×7): 1 [drp] via OPHTHALMIC
  Filled 2013-10-11: qty 10

## 2013-10-11 MED ORDER — ATORVASTATIN CALCIUM 40 MG PO TABS
40.0000 mg | ORAL_TABLET | Freq: Every day | ORAL | Status: DC
  Administered 2013-10-11 – 2013-10-13 (×3): 40 mg via ORAL
  Filled 2013-10-11 (×3): qty 1

## 2013-10-11 MED ORDER — ALPRAZOLAM 0.25 MG PO TABS
0.2500 mg | ORAL_TABLET | Freq: Once | ORAL | Status: AC
  Administered 2013-10-11: 0.25 mg via ORAL
  Filled 2013-10-11: qty 1

## 2013-10-11 MED ORDER — SERTRALINE HCL 100 MG PO TABS
100.0000 mg | ORAL_TABLET | Freq: Every day | ORAL | Status: DC
  Administered 2013-10-11 – 2013-10-13 (×3): 100 mg via ORAL
  Filled 2013-10-11 (×3): qty 1

## 2013-10-11 NOTE — Consult Note (Signed)
CC: Vision loss HPI: Patient reports she was in church yesterday evening when she had acute loss of vision in the left eye until everything was black.  She says it remained that way until overnight she had some improvement of the vision and can see some vision superiorly OS.  She says OD has not seen well for years.  She says she has been diagnosed with ARMD, but also describes having surgery with a bubble put into her eyes that "did not work." She says she does have some eye pain OS. However, when asked to describe the pain she consistently describes the vision changes noted above.  She says she uses drops for her eyes, probably Travatan OU and Azopt OS. She presented to the ED and was admitted due to CP and SOB. Consultation was requested.   POH: Cataract surgery. ARMD? OD. ?Glaucoma Patient sees Dr. Wallace Going at [facility]. Patient notes she saw Dr. Zadie Rhine at some point in the past.   Past Medical History  Diagnosis Date  . Cancer     uterine and questionable vulvar, s/p hysterectomy  . Hypercholesterolemia   . Chronic headaches   . GERD (gastroesophageal reflux disease)   . Glaucoma   . Arthritis   . Right bundle branch block   . Reactive airway disease   . Cystocele   . Rectocele   . Hypertension   . Macular degeneration    Past Surgical History  Procedure Laterality Date  . Cholecystectomy    . Appendectomy    . Abdominal hysterectomy    . Vulvectomy     History   Social History  . Marital Status: Married    Spouse Name: N/A    Number of Children: N/A  . Years of Education: N/A   Occupational History  . Not on file.   Social History Main Topics  . Smoking status: Never Smoker   . Smokeless tobacco: Never Used  . Alcohol Use: No  . Drug Use: No  . Sexual Activity: Not on file   Other Topics Concern  . Not on file   Social History Narrative  . No narrative on file   Family History  Problem Relation Age of Onset  . Heart disease Mother   .  Cancer Father     leukemia  . Heart disease Brother     x2  . Cancer Daughter     breast   Allergies  Allergen Reactions  . Ambien [Zolpidem Tartrate]     "hallucination"  . Codeine Itching   Scheduled Meds: . aspirin  325 mg Oral Daily  . atorvastatin  40 mg Oral q1800  . heparin  5,000 Units Subcutaneous Q8H  . pantoprazole  40 mg Oral Daily  . sertraline  100 mg Oral Daily   Continuous Infusions:  PRN Meds:.  ROS: See H&P 10/11/13  EXAM: Gen - NAD resting in bed.  VA near - OD 20/400, OS 20/200 -2 Pupils - No anisocoria. +RAPD OD. EOM - Full versions CVF - Nasal constriction superiorly and inferiorly OD. OS Inferior constriction nasal and temporal, greatest nasally.  IOP - by tonopen, OD 14, OS 11.  Penlight exam: Lids - wnl OU Conj - White OU. Cornea - Clear OU. AC - formed OU Iris - wnl OU Lens - pseudophakia OU  Neosynephrine and tropicamide instilled OU at 7:45 AM. DFE (20 D lens):  OD: Optic nerve sharp with glaucomatous cupping. Macular atrophy. Vessels perfused and periphery attached. OS: Optic nerve  sharp, probably some glautomatous cupping, small disc heme inferotemporally. Patchy macular atrophy and RPE clumping. Periphery attached. Vessels perfused with no emboli seen.  Imp/Plan:  1) Vision loss: DDX includes CVA/TIA with a less likely possibility that she had transient retinal ischemia such as from a CRAO.  However there would most likely be residual retinal findings from a CRAO.  Visual fields are abnormal, but it is difficult to determine what is due to glaucoma versus another cause.  History of acute vision loss is not consistent with open angle glaucoma with controlled IOP.  She does not have retinal detachment. I recommend proceeding with cardiovascular evaluation, with evaluation for CVA/TIA.  2) Probable primary open angle glaucoma: Rec use travatan OU qhs and Azopt OS tid (these appear to be her home meds as near as I can determine from her).  3)  ARMD - Nonexudative. Continue outpatient follow up. 4) Pseudophakia OU.   When evaluation and/or treatment for CVA/TIA is complete recommend outpatient follow up with her ophthalmologist at [facility].

## 2013-10-11 NOTE — Progress Notes (Addendum)
*  PRELIMINARY RESULTS* Vascular Ultrasound Carotid Doppler has been completed.  Preliminary findings: Bilateral:  1-39% ICA stenosis.  Vertebral artery flow is antegrade.   Left ICA origin has significant calcific plaque with vessel narrowing, however velocities are still in <39% range.    Landry Mellow, RDMS, RVT  10/11/2013, 2:05 PM

## 2013-10-11 NOTE — Progress Notes (Signed)
Pt is alert and oriented and neuro checks still unchanged (WNL) but she stated she is feeling a little "loopy". She received Xanax prior to MRI as ordered. She stated she has taken Xanax in the past but she always used it at night to help her sleep. Dr. Darrick Meigs paged to be made aware. Will continue to monitor

## 2013-10-11 NOTE — Progress Notes (Signed)
[facility] Progress Note [place] TEAM [address]    MARGARETHA [name] RKY:[id number] DOB: 07-22-1926 DOA: 10/10/2013 PCP: Alisa Graff, MD  Brief narrative: 80 year old female patient with history of hypertension and known macular degeneration. 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 She also has chronic headache. She is followed by physicians at [facility] in [facility] for these problems. On the morning of admission patient developed abrupt onset of visual disturbance which was described as like a curtain going down over her left eye. This is not associated with any pain. While still in the emergency department the patient was evaluated by an ophthalmologist who felt she may have had transient retinal ischemia but more likely a CVA/TIA process. He notes that her history of acute vision loss is not consistent with open angle glaucoma based on his clinical findings and she did not have retinal detachment. In addition to the above complaints the patient has also been having chest pain and dyspnea on exertion.  Assessment/Plan:   Vision loss, left eye/Dizziness -Ophthalmology following -not c/w acute retinal detachment or open angle glaucoma -pt had unilateral amurosis fugax which has persisted- MRI/MRA was negative -? If needs neurology evaluation before noting she does not have any other focal neurological symptoms consistent with ongoing TIA or acute CVA and noting normal MRI results -she needs to FU w/ here opthalmologist after dc -? If ECHO would be helpful for this issue -cont ASA    Headache -chronic issue and currently not any worse than baseline -has had extensive evaluations at [facility] and [facility]    Essential hypertension, benign -BP stable-not on meds pre admit    Chest pain on exertion/DOE (dyspnea on exertion) -unclear if just deconditioning or ischemic etiology -FU on ECHO esp looking for valve abnormalities or RWMA- would not be candidate for aggressive evaluation  and focus would primarily be medical management    Hypercholesterolemia    Pulmonary hypertension -may have progressed and be cause of DOE -FU ECHO -baseline measurement unknown    Anxiety -was given Xanax pre MRI due to c/o claustrophobia   DVT prophylaxis: Subcutaneous heparin Code Status: Full Family Communication: No family at bedside Disposition Plan/Expected LOS: Remain in telemetry, pending results of echocardiogram may be ready for discharge in the next 48 hours Isolation: None Nutritional Status: Stable  Consultants: Ophthalmology  Procedures: 2-D echocardiogram pending  Antibiotics: None  HPI/Subjective: Alert and sitting up in chair. Patient states maybe slight improvement in left visual field. No current chest pain or shortness of breath at  Objective: Blood pressure 141/76, pulse 75, temperature 98.1 F (36.7 C), temperature source Axillary, resp. rate 17, height 5\' 2"  (1.575 m), weight 162 lb 1.6 oz (73.528 kg), SpO2 96.00%.  Intake/Output Summary (Last 24 hours) at 10/11/13 1531 Last data filed at 10/11/13 1000  Gross per 24 hour  Intake    240 ml  Output      0 ml  Net    240 ml     Exam: Follow up exam completed. Patient admitted earlier today.  Scheduled Meds:  Scheduled Meds: . aspirin  325 mg Oral Daily  . atorvastatin  40 mg Oral q1800  . brinzolamide  1 drop Left Eye TID  . heparin  5,000 Units Subcutaneous Q8H  . latanoprost  1 drop Both Eyes QHS  . pantoprazole  40 mg Oral Daily  . sertraline  100 mg Oral Daily   Continuous Infusions:   **Reviewed in detail by the Attending  Physician  Data Reviewed: Basic Metabolic Panel:  Recent Labs Lab 10/10/13 2138 10/11/13 0755  NA 142 142  K 4.1 4.3  CL 104 106  CO2 24 25  GLUCOSE 112* 101*  BUN 18 14  CREATININE 0.82 0.80  CALCIUM 9.5 9.0   Liver Function Tests:  Recent Labs Lab 10/10/13 2138 10/11/13 0755  AST 13 11  ALT 8 7  ALKPHOS 132* 124*  BILITOT <0.2* 0.3    PROT 7.3 6.8  ALBUMIN 3.4* 3.1*   No results found for this basename: LIPASE, AMYLASE,  in the last 168 hours No results found for this basename: AMMONIA,  in the last 168 hours CBC:  Recent Labs Lab 10/10/13 2138 10/11/13 0755  WBC 7.3 7.7  NEUTROABS 4.2 5.5  HGB 11.9* 11.5*  HCT 36.1 35.3*  MCV 93.5 93.4  PLT 354 319   Cardiac Enzymes: No results found for this basename: CKTOTAL, CKMB, CKMBINDEX, TROPONINI,  in the last 168 hours BNP (last 3 results) No results found for this basename: PROBNP,  in the last 8760 hours CBG:  Recent Labs Lab 10/11/13 0741 10/11/13 1125  GLUCAP 97 104*    No results found for this or any previous visit (from the past 240 hour(s)).   Studies:  Recent x-ray studies have been reviewed in detail by the Attending Physician     Erin Hearing, ANP [facility] Office  [phone] Pager [phone]  **If unable to reach the above provider after paging please contact the [place] @ [phone]  On-Call/Text Page:      [email or web address]      password [email or web address]  If 7PM-7AM, please contact night-coverage [email or web address] Password [email or web address] 10/11/2013, 3:31 PM   LOS: 1 day

## 2013-10-11 NOTE — Progress Notes (Signed)
Echo Lab  2D Echocardiogram completed.  [place], RDCS 10/11/2013 2:48 PM

## 2013-10-11 NOTE — H&P (Signed)
[facility] History and Physical  Patient: [name]  BJS:[id number]  DOB: Feb 11, 1926  DOS: the patient was seen and examined on 10/11/2013 PCP: Alisa Graff, MD  Chief Complaint: Loss of vision in the left  HPI: [name] is a 80 y.o. female with Past medical history of hypertension, loss of vision in the right eye due to macular degeneration, glaucoma in the right eye, chronic headache for which she has undergone extensive workup at [place], [place]. The patient is coming from home. The patient mentions that this morning she went to church at which time she was listening to choir, she felt that there was a curtain coming down and then she be compliant on her left eye. She did not have any pain or headache or nausea or vomiting or chest pain or palpitation or any other focal neurological deficit or dizziness or lightheadedness. Her symptoms did not improve during the day and she came to the ER. She was placed on oxygen and then she mentions that it feels like a curtain has raised a little and she is able to see some with the left eye. Pt denies any fever, chills, headache, cough, chest pain, palpitation, shortness of breath, orthopnea, PND, nausea, vomiting, abdominal pain, diarrhea, constipation, active bleeding, burning urination, dizziness, pedal edema,  focal neurological deficit. She mentions that she has a sense of a bulge in both of her eye in the upper lid and also in the nasolabial fold. She mentions that 4 months ago she had an eye checkup and was told that everything was fine.  Review of Systems: as mentioned in the history of present illness.  A Comprehensive review of the other systems is negative.  Past Medical History  Diagnosis Date  . Cancer     uterine and questionable vulvar, s/p hysterectomy  . Hypercholesterolemia   . Chronic headaches   . GERD (gastroesophageal reflux disease)   . Glaucoma   . Arthritis   . Right bundle branch block   . Reactive airway  disease   . Cystocele   . Rectocele   . Hypertension   . Macular degeneration    Past Surgical History  Procedure Laterality Date  . Cholecystectomy    . Appendectomy    . Abdominal hysterectomy    . Vulvectomy     Social History:  reports that she has never smoked. She has never used smokeless tobacco. She reports that she does not drink alcohol or use illicit drugs. Independent for most of her  ADL.  Allergies  Allergen Reactions  . Ambien [Zolpidem Tartrate]     "hallucination"  . Codeine Itching    Family History  Problem Relation Age of Onset  . Heart disease Mother   . Cancer Father     leukemia  . Heart disease Brother     x2  . Cancer Daughter     breast    Prior to Admission medications   Medication Sig Start Date End Date Taking? Authorizing Provider  Multiple Vitamin (MULTIVITAMIN) tablet Take 1 tablet by mouth daily.   Yes Historical Provider, MD  Multiple Vitamins-Minerals (PRESERVISION AREDS) TABS Take 1 tablet by mouth every morning.   Yes Historical Provider, MD  Omega-3 Fatty Acids (FISH OIL CONCENTRATE PO) Take 1 capsule by mouth 2 (two) times daily. 1750mg    Yes Historical Provider, MD  omeprazole (PRILOSEC) 20 MG capsule Take 20 mg by mouth every morning.   Yes Historical Provider, MD  sertraline (ZOLOFT) 100 MG tablet  Take 100 mg by mouth daily.   Yes Historical Provider, MD  travoprost, benzalkonium, (TRAVATAN) 0.004 % ophthalmic solution Place 1 drop into both eyes at bedtime.   Yes Historical Provider, MD  vitamin D, CHOLECALCIFEROL, 400 UNITS tablet Take 400 Units by mouth daily.   Yes Historical Provider, MD    Physical Exam: Filed Vitals:   10/11/13 0042 10/11/13 0115 10/11/13 0145 10/11/13 0153  BP:  138/65    Pulse:  61 75   Temp: 98.1 F (36.7 C)   98.5 F (36.9 C)  TempSrc:    Oral  Resp:  17 25   Height:      Weight:      SpO2:  97% 100%     General: Alert, Awake and Oriented to Time, Place and Person. Appear in moderate  distress Eyes: PERRL ENT: Oral Mucosa clear moist. Neck: No JVD Cardiovascular: S1 and S2 Present, no Murmur, Peripheral Pulses Present Respiratory: Bilateral Air entry equal and Decreased, Clear to Auscultation,  No Crackles, no wheezes Abdomen: Bowel Sound Present, Soft and Non tender Skin: No Rash Extremities: No Pedal edema, no calf tenderness Neurologic: Grossly Unremarkable. Field of vision limited on lower quadrant only Extraocular muscle movements are intact  Labs on Admission:  CBC:  Recent Labs Lab 10/10/13 2138  WBC 7.3  NEUTROABS 4.2  HGB 11.9*  HCT 36.1  MCV 93.5  PLT 354    CMP     Component Value Date/Time   NA 142 10/10/2013 2138   K 4.1 10/10/2013 2138   CL 104 10/10/2013 2138   CO2 24 10/10/2013 2138   GLUCOSE 112* 10/10/2013 2138   BUN 18 10/10/2013 2138   CREATININE 0.82 10/10/2013 2138   CALCIUM 9.5 10/10/2013 2138   PROT 7.3 10/10/2013 2138   ALBUMIN 3.4* 10/10/2013 2138   AST 13 10/10/2013 2138   ALT 8 10/10/2013 2138   ALKPHOS 132* 10/10/2013 2138   BILITOT <0.2* 10/10/2013 2138   GFRNONAA 63* 10/10/2013 2138   GFRAA 74* 10/10/2013 2138    No results found for this basename: LIPASE, AMYLASE,  in the last 168 hours No results found for this basename: AMMONIA,  in the last 168 hours  No results found for this basename: CKTOTAL, CKMB, CKMBINDEX, TROPONINI,  in the last 168 hours BNP (last 3 results) No results found for this basename: PROBNP,  in the last 8760 hours  Radiological Exams on Admission: Ct Angio Chest Pe W/cm &/or Wo Cm  10/10/2013   CLINICAL DATA:  Shortness of breath and elevated D-dimer.  EXAM: CT ANGIOGRAPHY CHEST WITH CONTRAST  TECHNIQUE: Multidetector CT imaging of the chest was performed using the standard protocol during bolus administration of intravenous contrast. Multiplanar CT image reconstructions including MIPs were obtained to evaluate the vascular anatomy.  CONTRAST:  123mL OMNIPAQUE IOHEXOL 350 MG/ML SOLN  COMPARISON:  None.  FINDINGS:  THORACIC INLET/BODY WALL:  No acute abnormality.  MEDIASTINUM:  Mild cardiomegaly. No pericardial effusion. Diffuse coronary artery atherosclerosis. No evidence of pulmonary embolism. There is respiratory motion at the bases which limits assessment at this level. No evidence of acute aortic abnormality. No lymphadenopathy.  LUNG WINDOWS:  No consolidation. Mild dependent atelectasis. No effusion. No suspicious pulmonary nodule.  UPPER ABDOMEN:  No acute findings.  OSSEOUS:  No acute fracture. No suspicious lytic or blastic lesions. Degenerative disc disease throughout the thoracic spine with exaggerated kyphosis.  Review of the MIP images confirms the above findings.  IMPRESSION: No evidence of pulmonary embolism  or other acute intrathoracic disease.   Electronically Signed   By: Jorje Guild M.D.   On: 10/10/2013 23:56   Dg Chest Port 1 View  10/10/2013   CLINICAL DATA:  Shortness of breath left-sided chest pain  EXAM: PORTABLE CHEST - 1 VIEW  COMPARISON:  09/10/2012  FINDINGS: Heart size is normal. Vascular pattern is normal. Limited inspiratory effect. Mild bilateral lower lobe atelectasis.  IMPRESSION: No acute abnormality.   Electronically Signed   By: Skipper Cliche M.D.   On: 10/10/2013 22:04    EKG: Independently reviewed. normal sinus rhythm, frequent PVC's noted.  Assessment/Plan Active Problems:   Dizziness   Essential hypertension, benign   Hypercholesterolemia   Headache(784.0)   Vision loss, left eye   1. Vision loss, left eye The patient is presenting with painless loss of vision in her left eye which felt like a clerk pain coming down which is classical for retinal detachment. The ED physician has discussed her case with her ophthalmologist who will be consulting to see her. At present we will continue to monitor her on telemetry. Possible etiology other than retinal detachment could be CRAO OR CRVO as possible TIA. I would also get an MRI/MRA to rule out TIA since it is  difficult to assess whether she has bilateral visual loss.  2. Complains of chest pain and shortness of breath While patient was in the ED she had an episode of chest pain with shortness of breath with frequent PVCs. She did not have any V. Tach On evaluation of telemetry. Her CT angio chest was negative for any PE and her EKG did not show any acute signs of ischemia her troponin were negative as well. I would continue her on aspirin and follow serial troponins and get echocardiogram as well as carotid Doppler.  3. Mood disorder Continue Zoloft  Consults: Ophthalmology  DVT Prophylaxis: subcutaneous Heparin Nutrition: Advance as tolerated  Code Status: Full  Disposition: Admitted to inpatient in telemetry unit.  Author: Berle Mull, MD [facility] Pager: [phone] 10/11/2013, 2:27 AM    If 7PM-7AM, please contact night-coverage [email or web address] Password [email or web address]

## 2013-10-11 NOTE — ED Notes (Signed)
MD at bedside. 

## 2013-10-11 NOTE — Evaluation (Signed)
Physical Therapy Evaluation Patient Details Name: [name] MRN: [id number] DOB: 1926/07/24 Today's Date: 10/11/2013 Time: 1017-5102 PT Time Calculation (min): 24 min  PT Assessment / Plan / Recommendation History of Present Illness  Pt with Past medical history of hypertension, loss of vision in the right eye due to macular degeneration, glaucoma in the right eye, chronic headache for which she has undergone extensive workup at [place], [place].  At church this am and "went blind", admitted for work up, r/o TIA/CVA  Clinical Impression  Patient with history of frequent falls, presents today requiring min assist for mobility.  Patient will benefit from PT to increase safety and independence with mobility.  Feel patient still at risk for falls at discharge.  Recommend f/u PT to continue to address balance/dizziness.    PT Assessment  Patient needs continued PT services    Follow Up Recommendations  Home health PT;Supervision for mobility/OOB    Does the patient have the potential to tolerate intense rehabilitation      Barriers to Discharge        Equipment Recommendations  None recommended by PT    Recommendations for Other Services     Frequency Min 3X/week    Precautions / Restrictions Precautions Precautions: Fall   Pertinent Vitals/Pain Patient reports low back pain (chronic).  Deferred pain meds.      Mobility  Bed Mobility Bed Mobility: Supine to Sit Supine to Sit: 4: Min assist Details for Bed Mobility Assistance: required min assist to lift shoulders off bed and reach upright Transfers Transfers: Sit to Stand;Stand to Sit Sit to Stand: 4: Min assist;With upper extremity assist;From bed;From toilet Stand to Sit: 4: Min assist;With upper extremity assist;To chair/3-in-1;To toilet Details for Transfer Assistance: assist for safety/balance Ambulation/Gait Ambulation/Gait Assistance: 4: Min assist Ambulation Distance (Feet): 150 Feet Assistive device: Rolling  walker Gait Pattern: Step-through pattern;Narrow base of support Gait velocity: decreased Stairs: No Wheelchair Mobility Wheelchair Mobility: No Modified Rankin (Stroke Patients Only) Pre-Morbid Rankin Score: Slight disability Modified Rankin: Moderately severe disability    Exercises     PT Diagnosis: Generalized weakness  PT Problem List: Decreased activity tolerance;Decreased balance;Decreased mobility PT Treatment Interventions: Gait training;Functional mobility training;Therapeutic activities;Therapeutic exercise;Balance training     PT Goals(Current goals can be found in the care plan section) Acute Rehab PT Goals Patient Stated Goal: see again PT Goal Formulation: With patient Time For Goal Achievement: 10/25/13 Potential to Achieve Goals: Good  Visit Information  Last PT Received On: 10/11/13 Assistance Needed: +1 History of Present Illness: Pt with Past medical history of hypertension, loss of vision in the right eye due to macular degeneration, glaucoma in the right eye, chronic headache for which she has undergone extensive workup at [place], [place].  At church this am and "went blind", admitted for work up, r/o TIA/CVA       Prior [place] expects to be discharged to:: Private residence Living Arrangements: Spouse/significant other Available Help at Discharge: Family Type of Home: House Home Access: Stairs to enter Technical brewer of Steps: 2 Entrance Stairs-Rails: Right Home Layout: [place]: Environmental consultant - 2 wheels;Bedside commode;Cane - single point Prior Function Level of Independence: Independent with assistive device(s) Comments: falls frequently per patient secondary to dizziness/vision/headache Communication Communication: No difficulties    Cognition  Cognition Arousal/Alertness: Awake/alert Behavior During Therapy: WFL for tasks assessed/performed Overall Cognitive Status: Within Functional Limits for  tasks assessed    Extremity/Trunk Assessment Lower Extremity Assessment Lower Extremity Assessment:  Generalized weakness   Balance Balance Balance Assessed: Yes Static Sitting Balance Static Sitting - Balance Support: No upper extremity supported Static Sitting - Level of Assistance: 5: Stand by assistance Static Standing Balance Static Standing - Balance Support: Bilateral upper extremity supported Static Standing - Level of Assistance: 5: Stand by assistance  End of Session PT - End of Session Equipment Utilized During Treatment: Gait belt Activity Tolerance: Patient tolerated treatment well Patient left: in chair;with call bell/phone within reach;with chair alarm set  GP     Shanna Cisco, [place] 10/11/2013, 11:01 AM

## 2013-10-11 NOTE — Progress Notes (Signed)
Utilization review completed.  

## 2013-10-12 DIAGNOSIS — R0609 Other forms of dyspnea: Secondary | ICD-10-CM | POA: Diagnosis not present

## 2013-10-12 DIAGNOSIS — H547 Unspecified visual loss: Secondary | ICD-10-CM | POA: Diagnosis not present

## 2013-10-12 DIAGNOSIS — R0989 Other specified symptoms and signs involving the circulatory and respiratory systems: Secondary | ICD-10-CM

## 2013-10-12 DIAGNOSIS — I10 Essential (primary) hypertension: Secondary | ICD-10-CM | POA: Diagnosis not present

## 2013-10-12 LAB — BASIC METABOLIC PANEL
BUN: 15 mg/dL (ref 6–23)
CO2: 25 mEq/L (ref 19–32)
Calcium: 9.3 mg/dL (ref 8.4–10.5)
Chloride: 105 mEq/L (ref 96–112)
Creatinine, Ser: 0.99 mg/dL (ref 0.50–1.10)
GFR calc Af Amer: 59 mL/min — ABNORMAL LOW (ref >90)
GFR, EST NON AFRICAN AMERICAN: 51 mL/min — AB (ref >90)
Glucose, Bld: 143 mg/dL — ABNORMAL HIGH (ref 70–99)
POTASSIUM: 4.5 meq/L (ref 3.7–5.3)
Sodium: 141 mEq/L (ref 137–147)

## 2013-10-12 MED ORDER — FUROSEMIDE 20 MG PO TABS
20.0000 mg | ORAL_TABLET | Freq: Every day | ORAL | Status: DC
  Administered 2013-10-12 – 2013-10-13 (×2): 20 mg via ORAL
  Filled 2013-10-12 (×2): qty 1

## 2013-10-12 MED ORDER — DIPHENHYDRAMINE HCL 50 MG/ML IJ SOLN
25.0000 mg | Freq: Once | INTRAMUSCULAR | Status: AC
  Administered 2013-10-12: 25 mg via INTRAVENOUS
  Filled 2013-10-12: qty 1

## 2013-10-12 MED ORDER — METOCLOPRAMIDE HCL 5 MG/ML IJ SOLN
10.0000 mg | Freq: Once | INTRAMUSCULAR | Status: AC
  Administered 2013-10-12: 10 mg via INTRAVENOUS
  Filled 2013-10-12: qty 2

## 2013-10-12 MED ORDER — KETOROLAC TROMETHAMINE 30 MG/ML IJ SOLN
30.0000 mg | Freq: Once | INTRAMUSCULAR | Status: AC
  Administered 2013-10-12: 30 mg via INTRAVENOUS
  Filled 2013-10-12: qty 1

## 2013-10-12 MED ORDER — TRAZODONE HCL 100 MG PO TABS
100.0000 mg | ORAL_TABLET | Freq: Once | ORAL | Status: AC
  Administered 2013-10-13: 100 mg via ORAL
  Filled 2013-10-12: qty 1

## 2013-10-12 MED ORDER — ACETAMINOPHEN 325 MG PO TABS
650.0000 mg | ORAL_TABLET | ORAL | Status: DC | PRN
  Administered 2013-10-12 – 2013-10-13 (×3): 650 mg via ORAL
  Filled 2013-10-12 (×3): qty 2

## 2013-10-12 NOTE — Progress Notes (Signed)
Clinical Social Work Department BRIEF PSYCHOSOCIAL ASSESSMENT 10/12/2013  Patient:  EITHEL, RYALL     Account Number:  [id number]     Admit date:  10/10/2013  Clinical Social Worker:  Adair Laundry  Date/Time:  10/12/2013 03:00 PM  Referred by:  Physician  Date Referred:  10/12/2013 Referred for  SNF Placement   Other Referral:   Interview type:  Patient Other interview type:    PSYCHOSOCIAL DATA Living Status:  HUSBAND Admitted from facility:   Level of care:   Primary support name:  Justa Hatchell [phone] Primary support relationship to patient:  SPOUSE Degree of support available:   Pt has supportive family    CURRENT CONCERNS Current Concerns  Post-Acute Placement   Other Concerns:    SOCIAL WORK ASSESSMENT / PLAN CSW made aware that OT is recommending SNF and PT is recommending HH. CSW spoke with pt about both recommendations. Pt stated she would like to do whatever the hospital staff feels is best. CSW explained both recommendations to pt along with what ST rehab consists of. Pt was understanding and agreeable to being referred to [facility]. CSW explained referral process but at this time pt is only wanting to be referred to one facility.   Assessment/plan status:  Psychosocial Support/Ongoing Assessment of Needs Other assessment/ plan:   Information/referral to community resources:   SNF list denied    PATIENT'S/FAMILY'S RESPONSE TO PLAN OF CARE: Pt is agreeable to SNF but only at [facility] [place] [facility]

## 2013-10-12 NOTE — Progress Notes (Addendum)
Physical Therapy Treatment Patient Details Name: [name] MRN: [id number] DOB: [birth year] Today's Date: 10/12/2013 Time: 0347-4259 PT Time Calculation (min): 25 min  PT Assessment / Plan / Recommendation  History of Present Illness Pt with Past medical history of hypertension, loss of vision in the right eye due to macular degeneration, glaucoma in the right eye, chronic headache for which she has undergone extensive workup at [place], [place].  At church this am and "went blind", admitted for work up, r/o TIA/CVA   PT Comments   Pt making progress with all mobility and tolerated increased gait distance today.  Note her vision seems to be slowly returning, however she did have c/o seeing "spots" when asked what color therapist shirt is today.  Continue to recommend HHPT for follow up, as well as use of RW for increased stability initially before transitioning to rollator.  Pt did state mild dizziness with ambulation, however states that this has been going on for over a year.  Also note somewhat antalgic gait pattern with weakness on LLE.  She states that she got a knee replaced on that knee and "has been giving her trouble ever since."    Follow Up Recommendations  Home health PT;Supervision for mobility/OOB     Does the patient have the potential to tolerate intense rehabilitation     Barriers to Discharge        Equipment Recommendations  None recommended by PT    Recommendations for Other Services    Frequency Min 3X/week   Progress towards PT Goals Progress towards PT goals: Progressing toward goals  Plan Current plan remains appropriate    Precautions / Restrictions Restrictions Weight Bearing Restrictions: No   Pertinent Vitals/Pain No pain    Mobility  Bed Mobility Bed Mobility: Supine to Sit Supine to Sit: 5: Supervision;With rails Details for Bed Mobility Assistance: Pt able to lower LEs out of bed and elevate trunk into sitting with use of handrail.  Min cues for  technique.  Transfers Transfers: Sit to Stand;Stand to Sit Sit to Stand: 4: Min guard;From bed Stand to Sit: 4: Min guard;To chair/3-in-1 Details for Transfer Assistance: Min/guard to steady for safety with cues for safe hand placement and controlled descent when sitting.  Performed x 2 in order to use restroom.  Requires use of grab bars to stand from lower toilet.  Ambulation/Gait Ambulation/Gait Assistance: 4: Min guard Ambulation Distance (Feet): 300 Feet Assistive device: Rolling walker Ambulation/Gait Assistance Details: Mod cues for safe use and placement of RW as she tends to let RW get too far ahead of her.  Gait Pattern: Step-through pattern;Narrow base of support Gait velocity: decreased    Exercises     PT Diagnosis:    PT Problem List:   PT Treatment Interventions:     PT Goals (current goals can now be found in the care plan section) Acute Rehab PT Goals PT Goal Formulation: With patient Time For Goal Achievement: 10/25/13 Potential to Achieve Goals: Good  Visit Information  Last PT Received On: 10/12/13 Assistance Needed: +1 History of Present Illness: Pt with Past medical history of hypertension, loss of vision in the right eye due to macular degeneration, glaucoma in the right eye, chronic headache for which she has undergone extensive workup at [place], [place].  At church this am and "went blind", admitted for work up, r/o TIA/CVA    Subjective Data      Cognition  Cognition Arousal/Alertness: Awake/alert Behavior During Therapy: WFL for tasks assessed/performed Overall  Cognitive Status: Within Functional Limits for tasks assessed    Balance     End of Session PT - End of Session Equipment Utilized During Treatment: Gait belt Activity Tolerance: Patient tolerated treatment well Patient left: in chair;with call bell/phone within reach;with chair alarm set Nurse Communication: Mobility status   GP     [name] 10/12/2013, 12:50 PM

## 2013-10-12 NOTE — Progress Notes (Signed)
Occupational Therapy Evaluation Patient Details Name: MASAKO OVERALL MRN: [id number] DOB: 1926/01/08 Today's Date: 10/12/2013 Time: 3244-0102 OT Time Calculation (min): 34 min  OT Assessment / Plan / Recommendation History of present illness Pt with Past medical history of hypertension, loss of vision in the right eye due to macular degeneration, glaucoma in the right eye, chronic headache for which she has undergone extensive workup at [place], [place].  At church this am and "went blind", admitted for work up, r/o TIA/CVA   Clinical Impression   PTA, pt lived at home with husband and was mod I with ADL and mobility. Had suffered frequent falls due to "being dizzy". During assessment, pt required mod A at times to prevent falls during functional activities. Pt is not safe to D/C home at this level and would benefit from rehab at SNF to return to PLOF. Discussed with family, who would like [facility] if available. Pt will benefit from skilled OT services to facilitate D/C to next venue due to below deficits. Will educate on compensatory techniques for visual deficits.    OT Assessment  Patient needs continued OT Services    Follow Up Recommendations  SNF    Barriers to Discharge Decreased caregiver support elderly husband who had CVA in January 2014; walks with a cane  Equipment Recommendations  None recommended by OT    Recommendations for Other Services    Frequency  Min 2X/week    Precautions / Restrictions Precautions Precautions: Fall Precaution Comments: low vision Restrictions Weight Bearing Restrictions: No   Pertinent Vitals/Pain no apparent distress     ADL  Eating/Feeding: Supervision/safety;Set up Where Assessed - Eating/Feeding: Chair Grooming: Minimal assistance Where Assessed - Grooming: Supported standing Upper Body Bathing: Minimal assistance Where Assessed - Upper Body Bathing: Supported sitting Lower Body Bathing: Moderate assistance Where Assessed - Lower  Body Bathing: Supported sit to stand Upper Body Dressing: Moderate assistance Where Assessed - Upper Body Dressing: Supported sitting Lower Body Dressing: Moderate assistance Where Assessed - Lower Body Dressing: Supported sit to Lobbyist: Moderate assistance;Simulated Toilet Transfer Method: Other (comment) (aqmbulating) Toilet Transfer Equipment: Bedside commode Toileting - Clothing Manipulation and Hygiene: Moderate assistance Where Assessed - Toileting Clothing Manipulation and Hygiene: Sit to stand from 3-in-1 or toilet Transfers/Ambulation Related to ADLs: mod A at times ADL Comments: Pt with significant functional decline. Pt required physical assist to prevent multiple falls during session.    OT Diagnosis: Generalized weakness;Disturbance of vision  OT Problem List: Decreased activity tolerance;Impaired vision/perception;Decreased coordination;Decreased knowledge of use of DME or AE OT Treatment Interventions: Self-care/ADL training;DME and/or AE instruction;Therapeutic activities;Visual/perceptual remediation/compensation;Patient/family education;Balance training   OT Goals(Current goals can be found in the care plan section) Acute Rehab OT Goals Patient Stated Goal: to go home OT Goal Formulation: With patient Time For Goal Achievement: 10/26/13 Potential to Achieve Goals: Good  Visit Information  Last OT Received On: 10/12/13 Assistance Needed: +1 History of Present Illness: Pt with Past medical history of hypertension, loss of vision in the right eye due to macular degeneration, glaucoma in the right eye, chronic headache for which she has undergone extensive workup at [place], [place].  At church this am and "went blind", admitted for work up, r/o TIA/CVA       Prior [place] expects to be discharged to:: Skilled nursing facility Prior Function Level of Independence: Independent with assistive device(s) Comments: frequent  falls at home Communication Communication: No difficulties Dominant Hand: Right  Vision/Perception Vision - History Baseline Vision: Other (comment) Visual History: Glaucoma;Macular degeneration;Other (comment) (low vision R eye) Patient Visual Report: Blurring of vision;Central vision impairment;Peripheral vision impairment (L eye went "blind") Vision - Assessment Eye Alignment: Within Functional Limits Vision Assessment: Vision tested Ocular Range of Motion: Within Functional Limits Alignment/Gaze Preference: Within Defined Limits Tracking/Visual Pursuits: Decreased smoothness of horizontal tracking;Decreased smoothness of vertical tracking Saccades: Undershoots;Overshoots;Decreased speed of saccadic movement Visual Fields: Right visual field deficit;Left visual field deficit Perception Perception: Impaired   Cognition  Cognition Arousal/Alertness: Awake/alert Behavior During Therapy: WFL for tasks assessed/performed Overall Cognitive Status: Within Functional Limits for tasks assessed    Extremity/Trunk Assessment Upper Extremity Assessment Upper Extremity Assessment: Overall WFL for tasks assessed Lower Extremity Assessment Lower Extremity Assessment: Defer to PT evaluation Cervical / Trunk Assessment Cervical / Trunk Assessment: Normal     Mobility Bed Mobility Bed Mobility: Not assessed Supine to Sit: 5: Supervision;With rails Details for Bed Mobility Assistance: Pt able to lower LEs out of bed and elevate trunk into sitting with use of handrail.  Min cues for technique.  Transfers Transfers: Sit to Stand;Stand to Sit Sit to Stand: 4: Min assist;From chair/3-in-1 Stand to Sit: To chair/3-in-1;4: Min assist Details for Transfer Assistance: falling posteriorly when going tback to chair     Exercise     Balance Balance Balance Assessed: Yes Static Sitting Balance Static Sitting - Balance Support: No upper extremity supported;Feet supported Static  Sitting - Level of Assistance: 5: Stand by assistance Static Standing Balance Static Standing - Balance Support: Bilateral upper extremity supported;During functional activity Static Standing - Level of Assistance: 4: Min assist Dynamic Standing Balance Dynamic Standing - Balance Support: No upper extremity supported;During functional activity Dynamic Standing - Level of Assistance: 3: Mod assist   End of Session OT - End of Session Equipment Utilized During Treatment: Gait belt;Rolling walker Activity Tolerance: Patient tolerated treatment well Patient left: in chair;with call bell/phone within reach;with chair alarm set;with family/visitor present Nurse Communication: Mobility status;Other (comment) (need for SNF)  GO     Juleah Paradise,HILLARY 10/12/2013, 1:16 PM [facility], OTR/L  [phone] 10/12/2013

## 2013-10-12 NOTE — Progress Notes (Signed)
Pt had a run of bigimeny while working with OT. Pt asymptomatic at the time. Dr Darrick Meigs paged and made aware. Dorna Bloom, RN

## 2013-10-12 NOTE — Consult Note (Signed)
NEURO HOSPITALIST CONSULT NOTE    Reason for Consult: bilateral visual loss of sudden onset.   HPI:                                                                                                                                          [name] is an 80 y.o. female who has had visual difficulty with vision in her right eye for many years but on Sunday noted a sudden onset of visual loss in her left eye.  She states she was watching the choir when a shade like darkness came down over her eye "likelher eyelid was closing" until she had complete loss of vision in her left eye.  The vision had improved by Monday morning but she still did not have vision in her lower half of the left eye.  Denies any history of such like symptoms.  She denies any HA or unilateral numbness or tingling at that time. At present time she continues to have decreased vision in the left eye with inferior visual field.   She has been seen by opthalmology who dilated her eyes and noted the below mentioned:  Neosynephrine and tropicamide instilled OU at 7:45 AM.  DFE (20 D lens):  OD: Optic nerve sharp with glaucomatous cupping. Macular atrophy. Vessels perfused and periphery attached.  OS: Optic nerve sharp, probably some glautomatous cupping, small disc heme inferotemporally. Patchy macular atrophy and RPE clumping. Periphery attached. Vessels perfused with no emboli seen.  MRI/MRA was also obtained showing No acute CVA and large vessels are patent.    Past Medical History  Diagnosis Date  . Cancer     uterine and questionable vulvar, s/p hysterectomy  . Hypercholesterolemia   . Chronic headaches   . GERD (gastroesophageal reflux disease)   . Glaucoma   . Arthritis   . Right bundle branch block   . Reactive airway disease   . Cystocele   . Rectocele   . Hypertension   . Macular degeneration     Past Surgical History  Procedure Laterality Date  . Cholecystectomy    . Appendectomy     . Abdominal hysterectomy    . Vulvectomy      Family History  Problem Relation Age of Onset  . Heart disease Mother   . Cancer Father     leukemia  . Heart disease Brother     x2  . Cancer Daughter     breast      Social History:  reports that she has never smoked. She has never used smokeless tobacco. She reports that she does not drink alcohol or use illicit drugs.  Allergies  Allergen Reactions  . Ambien [Zolpidem Tartrate]     "hallucination"  . Codeine Itching  MEDICATIONS:                                                                                                                     Prior to Admission:  Prescriptions prior to admission  Medication Sig Dispense Refill  . Multiple Vitamin (MULTIVITAMIN) tablet Take 1 tablet by mouth daily.      . Multiple Vitamins-Minerals (PRESERVISION AREDS) TABS Take 1 tablet by mouth every morning.      . Omega-3 Fatty Acids (FISH OIL CONCENTRATE PO) Take 1 capsule by mouth 2 (two) times daily. $RemoveBefo'1750mg'[id number]$       . omeprazole (PRILOSEC) 20 MG capsule Take 20 mg by mouth every morning.      . sertraline (ZOLOFT) 100 MG tablet Take 100 mg by mouth daily.      . travoprost, benzalkonium, (TRAVATAN) 0.004 % ophthalmic solution Place 1 drop into both eyes at bedtime.      . vitamin D, CHOLECALCIFEROL, 400 UNITS tablet Take 400 Units by mouth daily.       Scheduled: . aspirin  325 mg Oral Daily  . atorvastatin  40 mg Oral q1800  . brinzolamide  1 drop Left Eye TID  . furosemide  20 mg Oral Daily  . heparin  5,000 Units Subcutaneous Q8H  . latanoprost  1 drop Both Eyes QHS  . pantoprazole  40 mg Oral Daily  . sertraline  100 mg Oral Daily   Continuous:  [id number]   ROS:                                                                                                                                       History obtained from the patient  General ROS: negative for - chills, fatigue, fever, night sweats, weight gain or  weight loss Psychological ROS: negative for - behavioral disorder, hallucinations, memory difficulties, mood swings or suicidal ideation Ophthalmic ROS: negative for - blurry vision, double vision, eye pain or loss of vision ENT ROS: negative for - epistaxis, nasal discharge, oral lesions, sore throat, tinnitus or vertigo Allergy and Immunology ROS: negative for - hives or itchy/watery eyes Hematological and Lymphatic ROS: negative for - bleeding problems, bruising or swollen lymph nodes Endocrine ROS: negative for - galactorrhea, hair pattern changes, polydipsia/polyuria or temperature intolerance Respiratory ROS: negative for - cough, hemoptysis, shortness of breath or wheezing Cardiovascular ROS: negative for - chest pain, dyspnea on exertion, edema or  irregular heartbeat Gastrointestinal ROS: negative for - abdominal pain, diarrhea, hematemesis, nausea/vomiting or stool incontinence Genito-Urinary ROS: negative for - dysuria, hematuria, incontinence or urinary frequency/urgency Musculoskeletal ROS: negative for - joint swelling or muscular weakness Neurological ROS: as noted in HPI Dermatological ROS: negative for rash and skin lesion changes   Blood pressure 138/74, pulse 75, temperature 97.7 F (36.5 C), temperature source Oral, resp. rate 16, height $RemoveBe'5\' 2"'[id number]$  (1.575 m), weight 73.528 kg (162 lb 1.6 oz), SpO2 97.00%.   Neurologic Examination:                                                                                                      Mental Status: Alert, oriented, thought content appropriate.  Speech fluent without evidence of aphasia.  Able to follow 3 step commands without difficulty. Cranial Nerves: II: Discs flat bilaterally; Visual fields shows a bilateral inferior deficit, pupils equal, round, reactive to light with APD in the right eye III,IV, VI: ptosis not present, extra-ocular motions intact bilaterally V,VII: smile symmetric, facial light touch sensation normal  bilaterally VIII: hearing normal bilaterally IX,X: gag reflex present XI: bilateral shoulder shrug XII: midline tongue extension without atrophy or fasciculations  Motor: Right : Upper extremity   5/5    Left:     Upper extremity   5/5  Lower extremity   78/5 (old)    Lower extremity   5/5 Tone and bulk:normal tone throughout; no atrophy noted Sensory: Pinprick and light touch intact throughout, bilaterally Deep Tendon Reflexes:  Right: Upper Extremity   Left: Upper extremity   biceps (C-5 to C-6) 2/4   biceps (C-5 to C-6) 2/4 tricep (C7) 2/4    triceps (C7) 2/4 Brachioradialis (C6) 2/4  Brachioradialis (C6) 2/4  Lower Extremity Lower Extremity  quadriceps (L-2 to L-4) 1/4   quadriceps (L-2 to L-4) 2/4 Achilles (S1) 1/4   Achilles (S1) 1/4  Plantars: Right: downgoing   Left: downgoing Cerebellar: normal finger-to-nose (but stated it was difficult for her due to not "being able to see her nose"),  normal heel-to-shin test Gait: normal.  CV: pulses palpable throughout    Lab Results  Component Value Date/Time   CHOL 192 10/11/2013  5:20 AM    Results for orders placed during the hospital encounter of 10/10/13 (from the past 48 hour(s))  CBC WITH DIFFERENTIAL     Status: Abnormal   Collection Time    10/10/13  9:38 PM      Result Value Range   WBC 7.3  4.0 - 10.5 K/uL   RBC 3.86 (*) 3.87 - 5.11 MIL/uL   Hemoglobin 11.9 (*) 12.0 - 15.0 g/dL   HCT 36.1  36.0 - 46.0 %   MCV 93.5  78.0 - 100.0 fL   MCH 30.8  26.0 - 34.0 pg   MCHC 33.0  30.0 - 36.0 g/dL   RDW 13.1  11.5 - 15.5 %   Platelets 354  150 - 400 K/uL   Neutrophils Relative % 57  43 - 77 %   Neutro Abs 4.2  1.7 - 7.7 K/uL  Lymphocytes Relative 33  12 - 46 %   Lymphs Abs 2.4  0.7 - 4.0 K/uL   Monocytes Relative 7  3 - 12 %   Monocytes Absolute 0.5  0.1 - 1.0 K/uL   Eosinophils Relative 3  0 - 5 %   Eosinophils Absolute 0.2  0.0 - 0.7 K/uL   Basophils Relative 1  0 - 1 %   Basophils Absolute 0.1  0.0 - 0.1 K/uL   COMPREHENSIVE METABOLIC PANEL     Status: Abnormal   Collection Time    10/10/13  9:38 PM      Result Value Range   Sodium 142  137 - 147 mEq/L   Comment: Please note change in reference range.   Potassium 4.1  3.7 - 5.3 mEq/L   Comment: Please note change in reference range.   Chloride 104  96 - 112 mEq/L   CO2 24  19 - 32 mEq/L   Glucose, Bld 112 (*) 70 - 99 mg/dL   BUN 18  6 - 23 mg/dL   Creatinine, Ser 0.82  0.50 - 1.10 mg/dL   Calcium 9.5  8.4 - 10.5 mg/dL   Total Protein 7.3  6.0 - 8.3 g/dL   Albumin 3.4 (*) 3.5 - 5.2 g/dL   AST 13  0 - 37 U/L   ALT 8  0 - 35 U/L   Alkaline Phosphatase 132 (*) 39 - 117 U/L   Total Bilirubin <0.2 (*) 0.3 - 1.2 mg/dL   GFR calc non Af Amer 63 (*) >90 mL/min   GFR calc Af Amer 74 (*) >90 mL/min   Comment: (NOTE)     The eGFR has been calculated using the CKD EPI equation.     This calculation has not been validated in all clinical situations.     eGFR's persistently <90 mL/min signify possible Chronic Kidney     Disease.  D-DIMER, QUANTITATIVE     Status: Abnormal   Collection Time    10/10/13  9:38 PM      Result Value Range   D-Dimer, Quant 0.82 (*) 0.00 - 0.48 ug/mL-FEU   Comment:            AT THE INHOUSE ESTABLISHED CUTOFF     VALUE OF 0.48 ug/mL FEU,     THIS ASSAY HAS BEEN DOCUMENTED     IN THE LITERATURE TO HAVE     A SENSITIVITY AND NEGATIVE     PREDICTIVE VALUE OF AT LEAST     98 TO 99%.  THE TEST RESULT     SHOULD BE CORRELATED WITH     AN ASSESSMENT OF THE CLINICAL     PROBABILITY OF DVT / VTE.  POCT I-STAT TROPONIN I     Status: None   Collection Time    10/10/13  9:53 PM      Result Value Range   Troponin i, poc 0.00  0.00 - 0.08 ng/mL   Comment 3            Comment: Due to the release kinetics of cTnI,     a negative result within the first hours     of the onset of symptoms does not rule out     myocardial infarction with certainty.     If myocardial infarction is still suspected,     repeat the test at  appropriate intervals.  HEMOGLOBIN A1C     Status: Abnormal   Collection Time    10/11/13  5:20 AM      Result Value Range   Hemoglobin A1C 6.3 (*) <5.7 %   Comment: (NOTE)                                                                               According to the ADA Clinical Practice Recommendations for 2011, when     HbA1c is used as a screening test:      >=6.5%   Diagnostic of Diabetes Mellitus               (if abnormal result is confirmed)     5.7-6.4%   Increased risk of developing Diabetes Mellitus     References:Diagnosis and Classification of Diabetes Mellitus,Diabetes     FTDD,2202,54(YHCWC 1):S62-S69 and Standards of Medical Care in             Diabetes - 2011,Diabetes Care,2011,34 (Suppl 1):S11-S61.   Mean Plasma Glucose 134 (*) <117 mg/dL   Comment: Performed at [facility]     Status: Abnormal   Collection Time    10/11/13  5:20 AM      Result Value Range   Cholesterol 192  0 - 200 mg/dL   Triglycerides 75  <150 mg/dL   HDL 62  >39 mg/dL   Total CHOL/HDL Ratio 3.1     VLDL 15  0 - 40 mg/dL   LDL Cholesterol 115 (*) 0 - 99 mg/dL   Comment:            Total Cholesterol/HDL:CHD Risk     Coronary Heart Disease Risk Table                         Men   Women      1/2 Average Risk   3.4   3.3      Average Risk       5.0   4.4      2 X Average Risk   9.6   7.1      3 X Average Risk  23.4   11.0                Use the calculated Patient Ratio     above and the CHD Risk Table     to determine the patient's CHD Risk.                ATP III CLASSIFICATION (LDL):      <100     mg/dL   Optimal      100-129  mg/dL   Near or Above                        Optimal      130-159  mg/dL   Borderline      160-189  mg/dL   High      >190     mg/dL   Very High  GLUCOSE, CAPILLARY     Status: None   Collection Time    10/11/13  7:41 AM      Result Value Range   Glucose-Capillary 97  70 - 99 mg/dL  Comment 1 Notify RN    CBC WITH DIFFERENTIAL      Status: Abnormal   Collection Time    10/11/13  7:55 AM      Result Value Range   WBC 7.7  4.0 - 10.5 K/uL   RBC 3.78 (*) 3.87 - 5.11 MIL/uL   Hemoglobin 11.5 (*) 12.0 - 15.0 g/dL   HCT 35.3 (*) 36.0 - 46.0 %   MCV 93.4  78.0 - 100.0 fL   MCH 30.4  26.0 - 34.0 pg   MCHC 32.6  30.0 - 36.0 g/dL   RDW 13.0  11.5 - 15.5 %   Platelets 319  150 - 400 K/uL   Neutrophils Relative % 71  43 - 77 %   Neutro Abs 5.5  1.7 - 7.7 K/uL   Lymphocytes Relative 19  12 - 46 %   Lymphs Abs 1.5  0.7 - 4.0 K/uL   Monocytes Relative 8  3 - 12 %   Monocytes Absolute 0.6  0.1 - 1.0 K/uL   Eosinophils Relative 2  0 - 5 %   Eosinophils Absolute 0.2  0.0 - 0.7 K/uL   Basophils Relative 0  0 - 1 %   Basophils Absolute 0.0  0.0 - 0.1 K/uL  COMPREHENSIVE METABOLIC PANEL     Status: Abnormal   Collection Time    10/11/13  7:55 AM      Result Value Range   Sodium 142  137 - 147 mEq/L   Potassium 4.3  3.7 - 5.3 mEq/L   Chloride 106  96 - 112 mEq/L   CO2 25  19 - 32 mEq/L   Glucose, Bld 101 (*) 70 - 99 mg/dL   BUN 14  6 - 23 mg/dL   Creatinine, Ser 0.80  0.50 - 1.10 mg/dL   Calcium 9.0  8.4 - 10.5 mg/dL   Total Protein 6.8  6.0 - 8.3 g/dL   Albumin 3.1 (*) 3.5 - 5.2 g/dL   AST 11  0 - 37 U/L   ALT 7  0 - 35 U/L   Alkaline Phosphatase 124 (*) 39 - 117 U/L   Total Bilirubin 0.3  0.3 - 1.2 mg/dL   GFR calc non Af Amer 65 (*) >90 mL/min   GFR calc Af Amer 76 (*) >90 mL/min   Comment: (NOTE)     The eGFR has been calculated using the CKD EPI equation.     This calculation has not been validated in all clinical situations.     eGFR's persistently <90 mL/min signify possible Chronic Kidney     Disease.  PROTIME-INR     Status: None   Collection Time    10/11/13  7:55 AM      Result Value Range   Prothrombin Time 13.1  11.6 - 15.2 seconds   INR 1.01  0.00 - 1.49  GLUCOSE, CAPILLARY     Status: Abnormal   Collection Time    10/11/13 11:25 AM      Result Value Range   Glucose-Capillary 104 (*) 70 - 99  mg/dL   Comment 1 Documented in Chart     Comment 2 Notify RN    GLUCOSE, CAPILLARY     Status: Abnormal   Collection Time    10/11/13  4:54 PM      Result Value Range   Glucose-Capillary 127 (*) 70 - 99 mg/dL    Ct Angio Chest Pe W/cm &/or Wo Cm  10/10/2013   CLINICAL  DATA:  Shortness of breath and elevated D-dimer.  EXAM: CT ANGIOGRAPHY CHEST WITH CONTRAST  TECHNIQUE: Multidetector CT imaging of the chest was performed using the standard protocol during bolus administration of intravenous contrast. Multiplanar CT image reconstructions including MIPs were obtained to evaluate the vascular anatomy.  CONTRAST:  132mL OMNIPAQUE IOHEXOL 350 MG/ML SOLN  COMPARISON:  None.  FINDINGS: THORACIC INLET/BODY WALL:  No acute abnormality.  MEDIASTINUM:  Mild cardiomegaly. No pericardial effusion. Diffuse coronary artery atherosclerosis. No evidence of pulmonary embolism. There is respiratory motion at the bases which limits assessment at this level. No evidence of acute aortic abnormality. No lymphadenopathy.  LUNG WINDOWS:  No consolidation. Mild dependent atelectasis. No effusion. No suspicious pulmonary nodule.  UPPER ABDOMEN:  No acute findings.  OSSEOUS:  No acute fracture. No suspicious lytic or blastic lesions. Degenerative disc disease throughout the thoracic spine with exaggerated kyphosis.  Review of the MIP images confirms the above findings.  IMPRESSION: No evidence of pulmonary embolism or other acute intrathoracic disease.   Electronically Signed   By: Jorje Guild M.D.   On: 10/10/2013 23:56   Mr Virgel Paling Wo Contrast  10/11/2013   CLINICAL DATA:  79 year old female with loss of vision in the left eye. Initial encounter. Shortness of breath.  EXAM: MRI HEAD WITHOUT CONTRAST  MRA HEAD WITHOUT CONTRAST  TECHNIQUE: Multiplanar, multiecho pulse sequences of the brain and surrounding structures were obtained without intravenous contrast. Angiographic images of the head were obtained using MRA technique  without contrast.  COMPARISON:  Head CT without contrast 09/10/2012, 11/08/2007.  FINDINGS: MRI HEAD FINDINGS  Cerebral volume is within normal limits for age. No restricted diffusion to suggest acute infarction. No midline shift, mass effect, evidence of mass lesion, ventriculomegaly, extra-axial collection or acute intracranial hemorrhage. Cervicomedullary junction and pituitary are within normal limits. Negative visualized cervical spine. Major intracranial vascular flow voids are preserved.  Pearline Cables and white matter signal is within normal limits for age throughout the brain. Postoperative changes to both globes. Otherwise orbits soft tissues appear within normal limits. Optic chiasm appears diminutive but otherwise normal.  Opacified right sphenoid sinus with inspissated material. This was partially calcified on 09/10/2012. Other Visualized paranasal sinuses and mastoids are clear. Visible internal auditory structures appear normal. Visualized scalp soft tissues are within normal limits. Normal bone marrow signal.  MRA HEAD FINDINGS  Antegrade flow in the posterior circulation with codominant distal vertebral arteries. Normal PICA origins. Patent vertebrobasilar junction. Patent AICA origins. No basilar artery stenosis. SCA and left PCA origins are normal. Fetal type right PCA origin. Left posterior communicating artery diminutive or absent. Bilateral PCA branches are within normal limits.  Antegrade flow in both ICA siphons. Tortuous distal cervical ICAs, more so the left. There is a degree of dolichoectasia a both siphons. No ICA stenosis. Both ophthalmic artery origins appear within normal limits. Normal right posterior communicating artery origin.  Hypoplastic or absent right ACA A1 segment. Anterior communicating artery and visualized ACA branches within normal limits. Normal left ICA terminus, left MCA and ACA origin. Normal right MCA origin. Visualized bilateral MCA branches are within normal limits.   IMPRESSION: 1. No acute intracranial abnormality. Normal for age non contrast MRI appearance of the brain. 2. Negative intracranial MRA except for ICA dolichoectasia and several normal anatomic variations. 3. Chronic right sphenoid sinusitis.   Electronically Signed   By: Lars Pinks M.D.   On: 10/11/2013 13:57   Mr Brain Wo Contrast  10/11/2013   CLINICAL DATA:  80 year old female with loss of vision in the left eye. Initial encounter. Shortness of breath.  EXAM: MRI HEAD WITHOUT CONTRAST  MRA HEAD WITHOUT CONTRAST  TECHNIQUE: Multiplanar, multiecho pulse sequences of the brain and surrounding structures were obtained without intravenous contrast. Angiographic images of the head were obtained using MRA technique without contrast.  COMPARISON:  Head CT without contrast 09/10/2012, 11/08/2007.  FINDINGS: MRI HEAD FINDINGS  Cerebral volume is within normal limits for age. No restricted diffusion to suggest acute infarction. No midline shift, mass effect, evidence of mass lesion, ventriculomegaly, extra-axial collection or acute intracranial hemorrhage. Cervicomedullary junction and pituitary are within normal limits. Negative visualized cervical spine. Major intracranial vascular flow voids are preserved.  Pearline Cables and white matter signal is within normal limits for age throughout the brain. Postoperative changes to both globes. Otherwise orbits soft tissues appear within normal limits. Optic chiasm appears diminutive but otherwise normal.  Opacified right sphenoid sinus with inspissated material. This was partially calcified on 09/10/2012. Other Visualized paranasal sinuses and mastoids are clear. Visible internal auditory structures appear normal. Visualized scalp soft tissues are within normal limits. Normal bone marrow signal.  MRA HEAD FINDINGS  Antegrade flow in the posterior circulation with codominant distal vertebral arteries. Normal PICA origins. Patent vertebrobasilar junction. Patent AICA origins. No basilar  artery stenosis. SCA and left PCA origins are normal. Fetal type right PCA origin. Left posterior communicating artery diminutive or absent. Bilateral PCA branches are within normal limits.  Antegrade flow in both ICA siphons. Tortuous distal cervical ICAs, more so the left. There is a degree of dolichoectasia a both siphons. No ICA stenosis. Both ophthalmic artery origins appear within normal limits. Normal right posterior communicating artery origin.  Hypoplastic or absent right ACA A1 segment. Anterior communicating artery and visualized ACA branches within normal limits. Normal left ICA terminus, left MCA and ACA origin. Normal right MCA origin. Visualized bilateral MCA branches are within normal limits.  IMPRESSION: 1. No acute intracranial abnormality. Normal for age non contrast MRI appearance of the brain. 2. Negative intracranial MRA except for ICA dolichoectasia and several normal anatomic variations. 3. Chronic right sphenoid sinusitis.   Electronically Signed   By: Lars Pinks M.D.   On: 10/11/2013 13:57   Dg Chest Port 1 View  10/10/2013   CLINICAL DATA:  Shortness of breath left-sided chest pain  EXAM: PORTABLE CHEST - 1 VIEW  COMPARISON:  09/10/2012  FINDINGS: Heart size is normal. Vascular pattern is normal. Limited inspiratory effect. Mild bilateral lower lobe atelectasis.  IMPRESSION: No acute abnormality.   Electronically Signed   By: Skipper Cliche M.D.   On: 10/10/2013 22:04   2 D echo Study Conclusions  - Left ventricle: The cavity size was normal. Wall thickness was normal. Systolic function was normal. The estimated ejection fraction was in the range of 60% to 65%. Wall motion was normal; there were no regional wall motion abnormalities. Doppler parameters are consistent with abnormal left ventricular relaxation (grade 1 diastolic dysfunction). The E/e' ratio is >10, suggesting elevated LV filling pressure. - Mitral valve: Mildly thickened leaflets . Trivial regurgitation. -  Right ventricle: RV systolic pressure: 25DG Hg (S, est). - Right atrium: The atrium was mildly dilated. - Tricuspid valve: Mild regurgitation. - Inferior vena cava: The vessel was normal in size; the respirophasic diameter changes were in the normal range (= 50%); findings are consistent with normal central venous pressure. - Pericardium, extracardiac: There was no pericardial effusion. Transthoracic echocardiography. M-mode, complete 2D, spectral Doppler, and  color Doppler. Height: Height: 157.5cm. Height: 62in. Weight: Weight: 73.5kg. Weight: 161.7lb. Body mass index: BMI: 29.6kg/m^2. Body surface area: BSA: 1.23m^2. Blood pressure: 139/64. Patient status: Inpatient. Location: Echo laboratory.   Vascular Ultrasound  Carotid Doppler has been completed. Preliminary findings: Bilateral: 1-39% ICA stenosis. Vertebral artery flow is antegrade.  Left ICA origin has significant calcific plaque with vessel narrowing, however velocities are still in <39% range   Assessment and plan per attending neurologist  Etta Quill PA-C [facility] [phone]  10/12/2013, 2:14 PM   Assessment/Plan: 80 YO female with acute onset of left eye visual field "like my eyelid was closing".  At present time continues to have variable differences in her vision (with PT was seeing spot, on my exam noted bilateral inferior visual deficit). MRI/MRA head showed no acute infarct or large vessel stenosis, Echo and Carotid dopplers were WNL.  A1c of 6.3 and LDL 115.   Etiology is not clear.  Possibly a small stroke not visualized on MRI however unlikely due to bilateral lower visual fields are affected and this likely would be large enough visualize.  Her stroke workup has been completed and shows no significant abnormalities.   Recommend: 1) Agree with initiation of ASA daily and Lipitor daily.  2) Continue with diabetic control.  3) Continue with PT 4) out patient opthalmology follow up  Patient  seen and examined together with physician assistant and I concur with the assessment and plan.  Dorian Pod, MD

## 2013-10-12 NOTE — Progress Notes (Signed)
[facility] PROGRESS NOTE  [name] [id number] DOB: 05/14/1926 DOA: 10/10/2013 PCP: Alisa Graff, MD  Interval history 80 year old female patient with history of hypertension and known macular degeneration. 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 She also has chronic headache. She is followed by physicians at [facility] in [facility] for these problems. On the morning of admission patient developed abrupt onset of visual disturbance which was described as like a curtain going down over her left eye. This is not associated with any pain. While still in the emergency department the patient was evaluated by an ophthalmologist who felt she may have had transient retinal ischemia but more likely a CVA/TIA process. He notes that her history of acute vision loss is not consistent with open angle glaucoma based on his clinical findings and she did not have retinal detachment. In addition to the above complaints the patient has also been having chest pain and dyspnea on exertion.   Assessment/Plan:  Vision loss, left eye/Dizziness  -Ophthalmology following  -not c/w acute retinal detachment or open angle glaucoma  -pt had unilateral amurosis fugax which has persisted-  -she needs to FU w/ here opthalmologist after dc  -? If ECHO would be helpful for this issue  -cont ASA  He is him patient was seen by neurology, at this time he did recommend to followupwith ophthalmology and continue with aspirin and Lipitor daily  Headache  -chronic issue and currently not any worse than baseline  -has had extensive evaluations at [facility] and [facility]   Essential hypertension, benign  -BP stable-not on meds pre admit   Chest pain on exertion/DOE (dyspnea on exertion)  -unclear if just deconditioning or ischemic etiology  -2-D echo shows grade 1 diastolic dysfunction Patient started on low-dose Lasix 20 mg by mouth daily for dyspnea on exertion  Hypercholesterolemia  Continue  statin  Anxiety  -was given Xanax pre MRI due to c/o claustrophobia     Code Status: patient is full code Family Communication: *discussed with patient in detail Disposition Plan: *skilled nursing facility   Consultants:  Ophthalmology  Neurology  Procedures:  2-D echo  Carotid Dopplers  Antibiotics:  none  HPI/Subjective: Patient seen and examined, admitted with vision loss in left eye. Patient was seen by ophthalmology in the ED. Stroke workup started for possible TIA/CVA  Objective: Filed Vitals:   10/12/13 1600  BP: 135/61  Pulse: 80  Temp: 98.3 F (36.8 C)  Resp: 16    Intake/Output Summary (Last 24 hours) at 10/12/13 1849 Last data filed at 10/12/13 1736  Gross per 24 hour  Intake    780 ml  Output      0 ml  Net    780 ml   Filed Weights   10/10/13 1922 10/11/13 0250  Weight: 70.308 kg (155 lb) 73.528 kg (162 lb 1.6 oz)    Exam:  Physical Exam: Head: Normocephalic, atraumatic.  Eyes: No signs of jaundice, EOMI Nose: Mucous membranes dry.  Throat: Oropharynx nonerythematous, no exudate appreciated.  Neck: supple,No deformities, masses, or tenderness noted. Lungs: Normal respiratory effort. B/L Clear to auscultation, no crackles or wheezes.  Heart: Regular RR. S1 and S2 normal  Abdomen: BS normoactive. Soft, Nondistended, non-tender.  Extremities: No pretibial edema, no erythema   Data Reviewed: Basic Metabolic Panel:  Recent Labs Lab 10/10/13 2138 10/11/13 0755 10/12/13 1410  NA 142 142 141  K 4.1 4.3 4.5  CL 104 106 105  CO2 24 25 25  GLUCOSE 112* 101* 143*  BUN 18 14 15   CREATININE 0.82 0.80 0.99  CALCIUM 9.5 9.0 9.3   Liver Function Tests:  Recent Labs Lab 10/10/13 2138 10/11/13 0755  AST 13 11  ALT 8 7  ALKPHOS 132* 124*  BILITOT <0.2* 0.3  PROT 7.3 6.8  ALBUMIN 3.4* 3.1*   No results found for this basename: LIPASE, AMYLASE,  in the last 168 hours No results found for this basename: AMMONIA,  in the last 168  hours CBC:  Recent Labs Lab 10/10/13 2138 10/11/13 0755  WBC 7.3 7.7  NEUTROABS 4.2 5.5  HGB 11.9* 11.5*  HCT 36.1 35.3*  MCV 93.5 93.4  PLT 354 319   Cardiac Enzymes: No results found for this basename: CKTOTAL, CKMB, CKMBINDEX, TROPONINI,  in the last 168 hours BNP (last 3 results) No results found for this basename: PROBNP,  in the last 8760 hours CBG:  Recent Labs Lab 10/11/13 0741 10/11/13 1125 10/11/13 1654  GLUCAP 97 104* 127*    No results found for this or any previous visit (from the past 240 hour(s)).   Studies: Ct Angio Chest Pe W/cm &/or Wo Cm  10/10/2013   CLINICAL DATA:  Shortness of breath and elevated D-dimer.  EXAM: CT ANGIOGRAPHY CHEST WITH CONTRAST  TECHNIQUE: Multidetector CT imaging of the chest was performed using the standard protocol during bolus administration of intravenous contrast. Multiplanar CT image reconstructions including MIPs were obtained to evaluate the vascular anatomy.  CONTRAST:  143mL OMNIPAQUE IOHEXOL 350 MG/ML SOLN  COMPARISON:  None.  FINDINGS: THORACIC INLET/BODY WALL:  No acute abnormality.  MEDIASTINUM:  Mild cardiomegaly. No pericardial effusion. Diffuse coronary artery atherosclerosis. No evidence of pulmonary embolism. There is respiratory motion at the bases which limits assessment at this level. No evidence of acute aortic abnormality. No lymphadenopathy.  LUNG WINDOWS:  No consolidation. Mild dependent atelectasis. No effusion. No suspicious pulmonary nodule.  UPPER ABDOMEN:  No acute findings.  OSSEOUS:  No acute fracture. No suspicious lytic or blastic lesions. Degenerative disc disease throughout the thoracic spine with exaggerated kyphosis.  Review of the MIP images confirms the above findings.  IMPRESSION: No evidence of pulmonary embolism or other acute intrathoracic disease.   Electronically Signed   By: Jorje Guild M.D.   On: 10/10/2013 23:56   Mr Virgel Paling Wo Contrast  10/11/2013   CLINICAL DATA:  80 year old female  with loss of vision in the left eye. Initial encounter. Shortness of breath.  EXAM: MRI HEAD WITHOUT CONTRAST  MRA HEAD WITHOUT CONTRAST  TECHNIQUE: Multiplanar, multiecho pulse sequences of the brain and surrounding structures were obtained without intravenous contrast. Angiographic images of the head were obtained using MRA technique without contrast.  COMPARISON:  Head CT without contrast 09/10/2012, 11/08/2007.  FINDINGS: MRI HEAD FINDINGS  Cerebral volume is within normal limits for age. No restricted diffusion to suggest acute infarction. No midline shift, mass effect, evidence of mass lesion, ventriculomegaly, extra-axial collection or acute intracranial hemorrhage. Cervicomedullary junction and pituitary are within normal limits. Negative visualized cervical spine. Major intracranial vascular flow voids are preserved.  Pearline Cables and white matter signal is within normal limits for age throughout the brain. Postoperative changes to both globes. Otherwise orbits soft tissues appear within normal limits. Optic chiasm appears diminutive but otherwise normal.  Opacified right sphenoid sinus with inspissated material. This was partially calcified on 09/10/2012. Other Visualized paranasal sinuses and mastoids are clear. Visible internal auditory structures appear normal. Visualized scalp soft tissues are within normal  limits. Normal bone marrow signal.  MRA HEAD FINDINGS  Antegrade flow in the posterior circulation with codominant distal vertebral arteries. Normal PICA origins. Patent vertebrobasilar junction. Patent AICA origins. No basilar artery stenosis. SCA and left PCA origins are normal. Fetal type right PCA origin. Left posterior communicating artery diminutive or absent. Bilateral PCA branches are within normal limits.  Antegrade flow in both ICA siphons. Tortuous distal cervical ICAs, more so the left. There is a degree of dolichoectasia a both siphons. No ICA stenosis. Both ophthalmic artery origins appear  within normal limits. Normal right posterior communicating artery origin.  Hypoplastic or absent right ACA A1 segment. Anterior communicating artery and visualized ACA branches within normal limits. Normal left ICA terminus, left MCA and ACA origin. Normal right MCA origin. Visualized bilateral MCA branches are within normal limits.  IMPRESSION: 1. No acute intracranial abnormality. Normal for age non contrast MRI appearance of the brain. 2. Negative intracranial MRA except for ICA dolichoectasia and several normal anatomic variations. 3. Chronic right sphenoid sinusitis.   Electronically Signed   By: Lars Pinks M.D.   On: 10/11/2013 13:57   Mr Brain Wo Contrast  10/11/2013   CLINICAL DATA:  80 year old female with loss of vision in the left eye. Initial encounter. Shortness of breath.  EXAM: MRI HEAD WITHOUT CONTRAST  MRA HEAD WITHOUT CONTRAST  TECHNIQUE: Multiplanar, multiecho pulse sequences of the brain and surrounding structures were obtained without intravenous contrast. Angiographic images of the head were obtained using MRA technique without contrast.  COMPARISON:  Head CT without contrast 09/10/2012, 11/08/2007.  FINDINGS: MRI HEAD FINDINGS  Cerebral volume is within normal limits for age. No restricted diffusion to suggest acute infarction. No midline shift, mass effect, evidence of mass lesion, ventriculomegaly, extra-axial collection or acute intracranial hemorrhage. Cervicomedullary junction and pituitary are within normal limits. Negative visualized cervical spine. Major intracranial vascular flow voids are preserved.  Pearline Cables and white matter signal is within normal limits for age throughout the brain. Postoperative changes to both globes. Otherwise orbits soft tissues appear within normal limits. Optic chiasm appears diminutive but otherwise normal.  Opacified right sphenoid sinus with inspissated material. This was partially calcified on 09/10/2012. Other Visualized paranasal sinuses and mastoids are  clear. Visible internal auditory structures appear normal. Visualized scalp soft tissues are within normal limits. Normal bone marrow signal.  MRA HEAD FINDINGS  Antegrade flow in the posterior circulation with codominant distal vertebral arteries. Normal PICA origins. Patent vertebrobasilar junction. Patent AICA origins. No basilar artery stenosis. SCA and left PCA origins are normal. Fetal type right PCA origin. Left posterior communicating artery diminutive or absent. Bilateral PCA branches are within normal limits.  Antegrade flow in both ICA siphons. Tortuous distal cervical ICAs, more so the left. There is a degree of dolichoectasia a both siphons. No ICA stenosis. Both ophthalmic artery origins appear within normal limits. Normal right posterior communicating artery origin.  Hypoplastic or absent right ACA A1 segment. Anterior communicating artery and visualized ACA branches within normal limits. Normal left ICA terminus, left MCA and ACA origin. Normal right MCA origin. Visualized bilateral MCA branches are within normal limits.  IMPRESSION: 1. No acute intracranial abnormality. Normal for age non contrast MRI appearance of the brain. 2. Negative intracranial MRA except for ICA dolichoectasia and several normal anatomic variations. 3. Chronic right sphenoid sinusitis.   Electronically Signed   By: Lars Pinks M.D.   On: 10/11/2013 13:57   Dg Chest Port 1 View  10/10/2013   CLINICAL DATA:  Shortness of breath left-sided chest pain  EXAM: PORTABLE CHEST - 1 VIEW  COMPARISON:  09/10/2012  FINDINGS: Heart size is normal. Vascular pattern is normal. Limited inspiratory effect. Mild bilateral lower lobe atelectasis.  IMPRESSION: No acute abnormality.   Electronically Signed   By: Skipper Cliche M.D.   On: 10/10/2013 22:04    Scheduled Meds: . aspirin  325 mg Oral Daily  . atorvastatin  40 mg Oral q1800  . brinzolamide  1 drop Left Eye TID  . furosemide  20 mg Oral Daily  . heparin  5,000 Units  Subcutaneous Q8H  . latanoprost  1 drop Both Eyes QHS  . pantoprazole  40 mg Oral Daily  . sertraline  100 mg Oral Daily   Continuous Infusions:   Principal Problem:   Vision loss, left eye Active Problems:   Dizziness   Essential hypertension, benign   Hypercholesterolemia   Pulmonary hypertension   Headache(784.0)   Anxiety   Chest pain on exertion   DOE (dyspnea on exertion)    Time spent: 25 min    [facility] Pager [phone]. If 7PM-7AM, please contact night-coverage at [email or web address], password [facility] 10/12/2013, 6:49 PM  LOS: 2 days

## 2013-10-12 NOTE — Progress Notes (Addendum)
Clinical Social Work Department CLINICAL SOCIAL WORK PLACEMENT NOTE 10/12/2013  Patient:  [name], [name]  Account Number:  [id number] Admit date:  10/10/2013  Clinical Social Worker:  Berton Mount, Latanya Presser  Date/time:  10/12/2013 04:00 PM  Clinical Social Work is seeking post-discharge placement for this patient at the following level of care:   [place]   (*CSW will update this form in Epic as items are completed)   10/12/2013  Patient/family provided with [place] Department of Clinical Social Work's list of facilities offering this level of care within the geographic area requested by the patient (or if unable, by the patient's family).  10/12/2013  Patient/family informed of their freedom to choose among providers that offer the needed level of care, that participate in Medicare, Medicaid or managed care program needed by the patient, have an available bed and are willing to accept the patient.  10/12/2013  Patient/family informed of [facility]' ownership interest in [facility], as well as of the fact that they are under no obligation to receive care at this facility.  PASARR submitted to EDS on 10/12/2013 PASARR number received from EDS on 10/12/2013  FL2 transmitted to all facilities in geographic area requested by pt/family on  10/12/2013 FL2 transmitted to all facilities within larger geographic area on   Patient informed that his/her managed care company has contracts with or will negotiate with  certain facilities, including the following:     Patient/family informed of bed offers received:  10/13/13 Patient chooses bed at [facility] Physician recommends and patient chooses bed at    Patient to be transferred to [facility]  on  10/13/2013 Patient to be transferred to facility by [facility]   The following physician request were entered in Epic:   Additional Comments:   [place], [address]

## 2013-10-13 DIAGNOSIS — R0609 Other forms of dyspnea: Secondary | ICD-10-CM | POA: Diagnosis not present

## 2013-10-13 DIAGNOSIS — I509 Heart failure, unspecified: Secondary | ICD-10-CM | POA: Diagnosis not present

## 2013-10-13 DIAGNOSIS — F411 Generalized anxiety disorder: Secondary | ICD-10-CM | POA: Diagnosis not present

## 2013-10-13 DIAGNOSIS — R079 Chest pain, unspecified: Secondary | ICD-10-CM

## 2013-10-13 DIAGNOSIS — H409 Unspecified glaucoma: Secondary | ICD-10-CM | POA: Diagnosis not present

## 2013-10-13 DIAGNOSIS — H547 Unspecified visual loss: Secondary | ICD-10-CM | POA: Diagnosis not present

## 2013-10-13 DIAGNOSIS — F330 Major depressive disorder, recurrent, mild: Secondary | ICD-10-CM | POA: Diagnosis not present

## 2013-10-13 DIAGNOSIS — E785 Hyperlipidemia, unspecified: Secondary | ICD-10-CM | POA: Diagnosis not present

## 2013-10-13 DIAGNOSIS — F329 Major depressive disorder, single episode, unspecified: Secondary | ICD-10-CM | POA: Diagnosis not present

## 2013-10-13 DIAGNOSIS — I270 Primary pulmonary hypertension: Secondary | ICD-10-CM | POA: Diagnosis not present

## 2013-10-13 DIAGNOSIS — I10 Essential (primary) hypertension: Secondary | ICD-10-CM | POA: Diagnosis not present

## 2013-10-13 DIAGNOSIS — F0280 Dementia in other diseases classified elsewhere without behavioral disturbance: Secondary | ICD-10-CM | POA: Diagnosis not present

## 2013-10-13 DIAGNOSIS — H3530 Unspecified macular degeneration: Secondary | ICD-10-CM | POA: Diagnosis not present

## 2013-10-13 DIAGNOSIS — Z5189 Encounter for other specified aftercare: Secondary | ICD-10-CM | POA: Diagnosis not present

## 2013-10-13 DIAGNOSIS — R51 Headache: Secondary | ICD-10-CM | POA: Diagnosis not present

## 2013-10-13 DIAGNOSIS — H5460 Unqualified visual loss, one eye, unspecified: Secondary | ICD-10-CM | POA: Diagnosis not present

## 2013-10-13 DIAGNOSIS — E7800 Pure hypercholesterolemia, unspecified: Secondary | ICD-10-CM | POA: Diagnosis not present

## 2013-10-13 DIAGNOSIS — F3289 Other specified depressive episodes: Secondary | ICD-10-CM | POA: Diagnosis not present

## 2013-10-13 DIAGNOSIS — H34239 Retinal artery branch occlusion, unspecified eye: Secondary | ICD-10-CM | POA: Diagnosis not present

## 2013-10-13 LAB — MAGNESIUM: Magnesium: 1.9 mg/dL (ref 1.5–2.5)

## 2013-10-13 LAB — BASIC METABOLIC PANEL
BUN: 19 mg/dL (ref 6–23)
CO2: 24 mEq/L (ref 19–32)
CREATININE: 1.03 mg/dL (ref 0.50–1.10)
Calcium: 9.4 mg/dL (ref 8.4–10.5)
Chloride: 105 mEq/L (ref 96–112)
GFR calc non Af Amer: 48 mL/min — ABNORMAL LOW (ref >90)
GFR, EST AFRICAN AMERICAN: 56 mL/min — AB (ref >90)
Glucose, Bld: 128 mg/dL — ABNORMAL HIGH (ref 70–99)
Potassium: 4.2 mEq/L (ref 3.7–5.3)
Sodium: 139 mEq/L (ref 137–147)

## 2013-10-13 MED ORDER — LATANOPROST 0.005 % OP SOLN: 1.0000 [drp] | Freq: Every day | OPHTHALMIC | Status: DC

## 2013-10-13 MED ORDER — ACETAMINOPHEN 325 MG PO TABS: 650.0000 mg | ORAL_TABLET | ORAL | Status: DC | PRN

## 2013-10-13 MED ORDER — ATORVASTATIN CALCIUM 40 MG PO TABS: 40.0000 mg | ORAL_TABLET | Freq: Every day | ORAL | Status: DC

## 2013-10-13 MED ORDER — FUROSEMIDE 20 MG PO TABS: 20.0000 mg | ORAL_TABLET | Freq: Every day | ORAL | Status: DC

## 2013-10-13 MED ORDER — ASPIRIN 325 MG PO TABS: 325.0000 mg | ORAL_TABLET | Freq: Every day | ORAL | Status: DC

## 2013-10-13 MED ORDER — BRINZOLAMIDE 1 % OP SUSP: 1.0000 [drp] | Freq: Three times a day (TID) | OPHTHALMIC | Status: DC

## 2013-10-13 NOTE — Progress Notes (Signed)
Reviewed discharge instructions with patient and son in law, they stated their understanding.  Report called to [place] at [facility]  Discharged with son in law to transport to facility, packet given to son in Sports coach. Sanda Linger

## 2013-10-13 NOTE — Discharge Summary (Signed)
Physician Discharge Summary  [name] DDU:[id number] DOB: [birth year] DOA: 10/10/2013  PCP: Alisa Graff, MD  Admit date: 10/10/2013 Discharge date: 10/13/2013  Time spent: 50 minutes  Recommendations for Outpatient Follow-up:  1. Follow up with Opthalmology, [facility] (Dr. Satira Sark saw her in house) 2. Check urinalysis on 1/8. Patient with some urgency. 3. Patient has chronic intermittent diarrhea.  BMET in 3-5 days. 4.  Needs physical therapy / occupational therapy at [place] on discharge. 5.  bmet/CBC on January 9. Patient started on Lasix.  Discharge Diagnoses:  Principal Problem:   Vision loss, left eye Active Problems:   Dizziness   Essential hypertension, benign   Hypercholesterolemia   Pulmonary hypertension   Headache(784.0)   Anxiety   Chest pain on exertion   DOE (dyspnea on exertion)   Discharge Condition: Stable.  Still with lower visual field defects  Diet recommendation: diabetic diet.  Filed Weights   10/10/13 1922 10/11/13 0250  Weight: 70.308 kg (155 lb) 73.528 kg (162 lb 1.6 oz)    History of present illness:  [name] is a 80 y.o. female with Past medical history of hypertension, DM,  loss of vision in the right eye due to macular degeneration, glaucoma in the right eye, chronic headache for which she has undergone extensive workup at [place], [place].  On the day of admission she went to church.  She was listening to choir, she felt that there was a curtain coming down and then she be compliant on her left eye. She did not have any pain or headache or nausea or vomiting or chest pain or palpitation or any other focal neurological deficit or dizziness or lightheadedness. Her symptoms did not improve during the day and she came to the ER. She was placed on oxygen and then she mentions that it feels like a curtain has raised a little and she is able to see some with the left eye. She mentions that she has a sense of a bulge in both of  her eye in the upper lid and also in the nasolabial fold   Hospital Course:   Acute left eye vision loss  Patient has a history of DM and Macular Degeneration.    Etiology is not clear. Possibly a small stroke not visualized on MRI vs transient retinal ischemia.    Her stroke workup (MRI, Carotid dopplers, serologies) has been completed and shows no significant abnormalities.   2D echo shows grade 1 diastolic dysfunction.  Without thrombus or other embolic source.  She was seen by neurology who recommends daily aspirin, Lipitor, diabetic control.  She will followup outpatient with [facility] ophthalmology.  She has regained some vision in her left eye but has lower visual field defects bilaterally.  Grade 1 diastolic dysfunction  Seen on echocardiogram this admission  Patient with minimal crackles on exam  Started on low-dose Lasix. Please monitor potassium as outpatient.  Headache  Chronic above right eye. Currently at baseline  Extensively worked up at [place] intermittent diarrhea  Electrolytes currently at baseline  Monitor outpatient. Consider GI followup  Chest pain on exertion/DOE (dyspnea on exertion)   Point-of-care troponin was negative  unclear if just deconditioning or ischemic etiology   2-D echo shows grade 1 diastolic dysfunction   Patient started on low-dose Lasix 20 mg by mouth daily for dyspnea on exertion  Hypercholesterolemia   Continue statin  DM  Hgb A1C 6.3  Diet controlled.  Procedures: Carotid Doppler . Preliminary findings: Bilateral: 1-39% ICA stenosis. Vertebral artery flow is antegrade.  Left ICA origin has significant calcific plaque with vessel narrowing, however velocities are still in <39% range  She has been seen by opthalmology who dilated her eyes and noted the below mentioned:  Neosynephrine and tropicamide instilled OU at 7:45 AM.  DFE (20 D lens):  OD: Optic nerve sharp with  glaucomatous cupping. Macular atrophy. Vessels perfused and periphery attached.  OS: Optic nerve sharp, probably some glautomatous cupping, small disc heme inferotemporally. Patchy macular atrophy and RPE clumping. Periphery attached. Vessels perfused with no emboli seen.   Consultations: Neurology, ophthalmology   Discharge Exam: Filed Vitals:   10/13/13 1253  BP: 118/71  Pulse: 87  Temp: 98 F (36.7 C)  Resp: 18    General: Alert and oriented, comfortable in bed, no apparent distress  Cardiovascular: Regular rate and rhythm, no murmurs rubs or gallops appreciated no lower extremity edema  Respiratory: Minimal crackles particularly on the left no increased work of breathing Abdomen: Soft, nontender, nondistended, no masses   extremities: Able to move all 4, +5 over 5 strength in each Neuro: Patient has bilateral lower portion visual field defects. Otherwise her neuro exam is nonfocal   Discharge Instructions      Discharge Orders   Future Orders Complete By Expires   Diet - low sodium heart healthy  As directed    Increase activity slowly  As directed        Medication List         acetaminophen 325 MG tablet  Commonly known as:  TYLENOL  Take 2 tablets (650 mg total) by mouth every 4 (four) hours as needed for mild pain, fever or headache.     aspirin 325 MG tablet  Take 1 tablet (325 mg total) by mouth daily.     atorvastatin 40 MG tablet  Commonly known as:  LIPITOR  Take 1 tablet (40 mg total) by mouth daily at 6 PM.     brinzolamide 1 % ophthalmic suspension  Commonly known as:  AZOPT  Place 1 drop into the left eye 3 (three) times daily.     FISH OIL CONCENTRATE PO  Take 1 capsule by mouth 2 (two) times daily. 1750mg      furosemide 20 MG tablet  Commonly known as:  LASIX  Take 1 tablet (20 mg total) by mouth daily.     latanoprost 0.005 % ophthalmic solution  Commonly known as:  XALATAN  Place 1 drop into both eyes at bedtime.     multivitamin  tablet  Take 1 tablet by mouth daily.     omeprazole 20 MG capsule  Commonly known as:  PRILOSEC  Take 20 mg by mouth every morning.     PRESERVISION AREDS Tabs  Take 1 tablet by mouth every morning.     sertraline 100 MG tablet  Commonly known as:  ZOLOFT  Take 100 mg by mouth daily.     travoprost (benzalkonium) 0.004 % ophthalmic solution  Commonly known as:  TRAVATAN  Place 1 drop into both eyes at bedtime.     vitamin D (CHOLECALCIFEROL) 400 UNITS tablet  Take 400 Units by mouth daily.       Allergies  Allergen Reactions  . Ambien [Zolpidem Tartrate]     "hallucination"  . Codeine Itching      The results of significant diagnostics from this hospitalization (including imaging, microbiology, ancillary and laboratory) are listed below for reference.  Significant Diagnostic Studies:  2D Echo Study Conclusions  - Left ventricle: The cavity size was normal. Wall thickness was normal. Systolic function was normal. The estimated ejection fraction was in the range of 60% to 65%. Wall motion was normal; there were no regional wall motion abnormalities. Doppler parameters are consistent with abnormal left ventricular relaxation (grade 1 diastolic dysfunction). The E/e' ratio is >10, suggesting elevated LV filling pressure. - Mitral valve: Mildly thickened leaflets . Trivial regurgitation. - Right ventricle: RV systolic pressure: 0000000 Hg (S, est). - Right atrium: The atrium was mildly dilated. - Tricuspid valve: Mild regurgitation. - Inferior vena cava: The vessel was normal in size; the respirophasic diameter changes were in the normal range (= 50%); findings are consistent with normal central venous pressure. - Pericardium, extracardiac: There was no pericardial effusion.    Ct Angio Chest Pe W/cm &/or Wo Cm  10/10/2013   CLINICAL DATA:  Shortness of breath and elevated D-dimer.  EXAM: CT ANGIOGRAPHY CHEST WITH CONTRAST  TECHNIQUE: Multidetector CT imaging of  the chest was performed using the standard protocol during bolus administration of intravenous contrast. Multiplanar CT image reconstructions including MIPs were obtained to evaluate the vascular anatomy.  CONTRAST:  150mL OMNIPAQUE IOHEXOL 350 MG/ML SOLN  COMPARISON:  None.  FINDINGS: THORACIC INLET/BODY WALL:  No acute abnormality.  MEDIASTINUM:  Mild cardiomegaly. No pericardial effusion. Diffuse coronary artery atherosclerosis. No evidence of pulmonary embolism. There is respiratory motion at the bases which limits assessment at this level. No evidence of acute aortic abnormality. No lymphadenopathy.  LUNG WINDOWS:  No consolidation. Mild dependent atelectasis. No effusion. No suspicious pulmonary nodule.  UPPER ABDOMEN:  No acute findings.  OSSEOUS:  No acute fracture. No suspicious lytic or blastic lesions. Degenerative disc disease throughout the thoracic spine with exaggerated kyphosis.  Review of the MIP images confirms the above findings.  IMPRESSION: No evidence of pulmonary embolism or other acute intrathoracic disease.   Electronically Signed   By: Jorje Guild M.D.   On: 10/10/2013 23:56   Mr Virgel Paling Wo Contrast  10/11/2013   CLINICAL DATA:  80 year old female with loss of vision in the left eye. Initial encounter. Shortness of breath.  EXAM: MRI HEAD WITHOUT CONTRAST  MRA HEAD WITHOUT CONTRAST  TECHNIQUE: Multiplanar, multiecho pulse sequences of the brain and surrounding structures were obtained without intravenous contrast. Angiographic images of the head were obtained using MRA technique without contrast.  COMPARISON:  Head CT without contrast 09/10/2012, 11/08/2007.  FINDINGS: MRI HEAD FINDINGS  Cerebral volume is within normal limits for age. No restricted diffusion to suggest acute infarction. No midline shift, mass effect, evidence of mass lesion, ventriculomegaly, extra-axial collection or acute intracranial hemorrhage. Cervicomedullary junction and pituitary are within normal limits.  Negative visualized cervical spine. Major intracranial vascular flow voids are preserved.  Pearline Cables and white matter signal is within normal limits for age throughout the brain. Postoperative changes to both globes. Otherwise orbits soft tissues appear within normal limits. Optic chiasm appears diminutive but otherwise normal.  Opacified right sphenoid sinus with inspissated material. This was partially calcified on 09/10/2012. Other Visualized paranasal sinuses and mastoids are clear. Visible internal auditory structures appear normal. Visualized scalp soft tissues are within normal limits. Normal bone marrow signal.  MRA HEAD FINDINGS  Antegrade flow in the posterior circulation with codominant distal vertebral arteries. Normal PICA origins. Patent vertebrobasilar junction. Patent AICA origins. No basilar artery stenosis. SCA and left PCA origins are normal. Fetal type right PCA origin. Left posterior  communicating artery diminutive or absent. Bilateral PCA branches are within normal limits.  Antegrade flow in both ICA siphons. Tortuous distal cervical ICAs, more so the left. There is a degree of dolichoectasia a both siphons. No ICA stenosis. Both ophthalmic artery origins appear within normal limits. Normal right posterior communicating artery origin.  Hypoplastic or absent right ACA A1 segment. Anterior communicating artery and visualized ACA branches within normal limits. Normal left ICA terminus, left MCA and ACA origin. Normal right MCA origin. Visualized bilateral MCA branches are within normal limits.  IMPRESSION: 1. No acute intracranial abnormality. Normal for age non contrast MRI appearance of the brain. 2. Negative intracranial MRA except for ICA dolichoectasia and several normal anatomic variations. 3. Chronic right sphenoid sinusitis.   Electronically Signed   By: Lars Pinks M.D.   On: 10/11/2013 13:57   Mr Brain Wo Contrast  10/11/2013   CLINICAL DATA:  80 year old female with loss of vision in the  left eye. Initial encounter. Shortness of breath.  EXAM: MRI HEAD WITHOUT CONTRAST  MRA HEAD WITHOUT CONTRAST  TECHNIQUE: Multiplanar, multiecho pulse sequences of the brain and surrounding structures were obtained without intravenous contrast. Angiographic images of the head were obtained using MRA technique without contrast.  COMPARISON:  Head CT without contrast 09/10/2012, 11/08/2007.  FINDINGS: MRI HEAD FINDINGS  Cerebral volume is within normal limits for age. No restricted diffusion to suggest acute infarction. No midline shift, mass effect, evidence of mass lesion, ventriculomegaly, extra-axial collection or acute intracranial hemorrhage. Cervicomedullary junction and pituitary are within normal limits. Negative visualized cervical spine. Major intracranial vascular flow voids are preserved.  Pearline Cables and white matter signal is within normal limits for age throughout the brain. Postoperative changes to both globes. Otherwise orbits soft tissues appear within normal limits. Optic chiasm appears diminutive but otherwise normal.  Opacified right sphenoid sinus with inspissated material. This was partially calcified on 09/10/2012. Other Visualized paranasal sinuses and mastoids are clear. Visible internal auditory structures appear normal. Visualized scalp soft tissues are within normal limits. Normal bone marrow signal.  MRA HEAD FINDINGS  Antegrade flow in the posterior circulation with codominant distal vertebral arteries. Normal PICA origins. Patent vertebrobasilar junction. Patent AICA origins. No basilar artery stenosis. SCA and left PCA origins are normal. Fetal type right PCA origin. Left posterior communicating artery diminutive or absent. Bilateral PCA branches are within normal limits.  Antegrade flow in both ICA siphons. Tortuous distal cervical ICAs, more so the left. There is a degree of dolichoectasia a both siphons. No ICA stenosis. Both ophthalmic artery origins appear within normal limits. Normal  right posterior communicating artery origin.  Hypoplastic or absent right ACA A1 segment. Anterior communicating artery and visualized ACA branches within normal limits. Normal left ICA terminus, left MCA and ACA origin. Normal right MCA origin. Visualized bilateral MCA branches are within normal limits.  IMPRESSION: 1. No acute intracranial abnormality. Normal for age non contrast MRI appearance of the brain. 2. Negative intracranial MRA except for ICA dolichoectasia and several normal anatomic variations. 3. Chronic right sphenoid sinusitis.   Electronically Signed   By: Lars Pinks M.D.   On: 10/11/2013 13:57   Dg Chest Port 1 View  10/10/2013   CLINICAL DATA:  Shortness of breath left-sided chest pain  EXAM: PORTABLE CHEST - 1 VIEW  COMPARISON:  09/10/2012  FINDINGS: Heart size is normal. Vascular pattern is normal. Limited inspiratory effect. Mild bilateral lower lobe atelectasis.  IMPRESSION: No acute abnormality.   Electronically Signed   By:  Skipper Cliche M.D.   On: 10/10/2013 22:04      Labs: Basic Metabolic Panel:  Recent Labs Lab 10/10/13 2138 10/11/13 0755 10/12/13 1410 10/13/13 0005  NA 142 142 141 139  K 4.1 4.3 4.5 4.2  CL 104 106 105 105  CO2 24 25 25 24   GLUCOSE 112* 101* 143* 128*  BUN 18 14 15 19   CREATININE 0.82 0.80 0.99 1.03  CALCIUM 9.5 9.0 9.3 9.4  MG  --   --   --  1.9   Liver Function Tests:  Recent Labs Lab 10/10/13 2138 10/11/13 0755  AST 13 11  ALT 8 7  ALKPHOS 132* 124*  BILITOT <0.2* 0.3  PROT 7.3 6.8  ALBUMIN 3.4* 3.1*   CBC:  Recent Labs Lab 10/10/13 2138 10/11/13 0755  WBC 7.3 7.7  NEUTROABS 4.2 5.5  HGB 11.9* 11.5*  HCT 36.1 35.3*  MCV 93.5 93.4  PLT 354 319   CBG:  Recent Labs Lab 10/11/13 0741 10/11/13 1125 10/11/13 1654  GLUCAP 97 104* 127*       SignedKaren Kitchens [phone]  [facility] 10/13/2013, 2:34 PM

## 2013-10-13 NOTE — Discharge Instructions (Signed)
Headaches, Frequently Asked Questions MIGRAINE HEADACHES Q: What is migraine? What causes it? How can I treat it? A: Generally, migraine headaches begin as a dull ache. Then they develop into a constant, throbbing, and pulsating pain. You may experience pain at the temples. You may experience pain at the front or back of one or both sides of the head. The pain is usually accompanied by a combination of:  Nausea.  Vomiting.  Sensitivity to light and noise. Some people (about 15%) experience an aura (see below) before an attack. The cause of migraine is believed to be chemical reactions in the brain. Treatment for migraine may include over-the-counter or prescription medications. It may also include self-help techniques. These include relaxation training and biofeedback.  Q: What is an aura? A: About 15% of people with migraine get an "aura". This is a sign of neurological symptoms that occur before a migraine headache. You may see wavy or jagged lines, dots, or flashing lights. You might experience tunnel vision or blind spots in one or both eyes. The aura can include visual or auditory hallucinations (something imagined). It may include disruptions in smell (such as strange odors), taste or touch. Other symptoms include:  Numbness.  A "pins and needles" sensation.  Difficulty in recalling or speaking the correct word. These neurological events may last as long as 60 minutes. These symptoms will fade as the headache begins. Q: What is a trigger? A: Certain physical or environmental factors can lead to or "trigger" a migraine. These include:  Foods.  Hormonal changes.  Weather.  Stress. It is important to remember that triggers are different for everyone. To help prevent migraine attacks, you need to figure out which triggers affect you. Keep a headache diary. This is a good way to track triggers. The diary will help you talk to your healthcare professional about your condition. Q: Does  weather affect migraines? A: Bright sunshine, hot, humid conditions, and drastic changes in barometric pressure may lead to, or "trigger," a migraine attack in some people. But studies have shown that weather does not act as a trigger for everyone with migraines. Q: What is the link between migraine and hormones? A: Hormones start and regulate many of your body's functions. Hormones keep your body in balance within a constantly changing environment. The levels of hormones in your body are unbalanced at times. Examples are during menstruation, pregnancy, or menopause. That can lead to a migraine attack. In fact, about three quarters of all women with migraine report that their attacks are related to the menstrual cycle.  Q: Is there an increased risk of stroke for migraine sufferers? A: The likelihood of a migraine attack causing a stroke is very remote. That is not to say that migraine sufferers cannot have a stroke associated with their migraines. In persons under age 53, the most common associated factor for stroke is migraine headache. But over the course of a person's normal life span, the occurrence of migraine headache may actually be associated with a reduced risk of dying from cerebrovascular disease due to stroke.  Q: What are acute medications for migraine? A: Acute medications are used to treat the pain of the headache after it has started. Examples over-the-counter medications, NSAIDs, ergots, and triptans.  Q: What are the triptans? A: Triptans are the newest class of abortive medications. They are specifically targeted to treat migraine. Triptans are vasoconstrictors. They moderate some chemical reactions in the brain. The triptans work on receptors in your brain. Triptans help  to restore the balance of a neurotransmitter called serotonin. Fluctuations in levels of serotonin are thought to be a main cause of migraine.  °Q: Are over-the-counter medications for migraine effective? °A:  Over-the-counter, or "OTC," medications may be effective in relieving mild to moderate pain and associated symptoms of migraine. But you should see your caregiver before beginning any treatment regimen for migraine.  °Q: What are preventive medications for migraine? °A: Preventive medications for migraine are sometimes referred to as "prophylactic" treatments. They are used to reduce the frequency, severity, and length of migraine attacks. Examples of preventive medications include antiepileptic medications, antidepressants, beta-blockers, calcium channel blockers, and NSAIDs (nonsteroidal anti-inflammatory drugs). °Q: Why are anticonvulsants used to treat migraine? °A: During the past few years, there has been an increased interest in antiepileptic drugs for the prevention of migraine. They are sometimes referred to as "anticonvulsants". Both epilepsy and migraine may be caused by similar reactions in the brain.  °Q: Why are antidepressants used to treat migraine? °A: Antidepressants are typically used to treat people with depression. They may reduce migraine frequency by regulating chemical levels, such as serotonin, in the brain.  °Q: What alternative therapies are used to treat migraine? °A: The term "alternative therapies" is often used to describe treatments considered outside the scope of conventional Western medicine. Examples of alternative therapy include acupuncture, acupressure, and yoga. Another common alternative treatment is herbal therapy. Some herbs are believed to relieve headache pain. Always discuss alternative therapies with your caregiver before proceeding. Some herbal products contain arsenic and other toxins. °TENSION HEADACHES °Q: What is a tension-type headache? What causes it? How can I treat it? °A: Tension-type headaches occur randomly. They are often the result of temporary stress, anxiety, fatigue, or anger. Symptoms include soreness in your temples, a tightening band-like sensation  around your head (a "vice-like" ache). Symptoms can also include a pulling feeling, pressure sensations, and contracting head and neck muscles. The headache begins in your forehead, temples, or the back of your head and neck. Treatment for tension-type headache may include over-the-counter or prescription medications. Treatment may also include self-help techniques such as relaxation training and biofeedback. °CLUSTER HEADACHES °Q: What is a cluster headache? What causes it? How can I treat it? °A: Cluster headache gets its name because the attacks come in groups. The pain arrives with little, if any, warning. It is usually on one side of the head. A tearing or bloodshot eye and a runny nose on the same side of the headache may also accompany the pain. Cluster headaches are believed to be caused by chemical reactions in the brain. They have been described as the most severe and intense of any headache type. Treatment for cluster headache includes prescription medication and oxygen. °SINUS HEADACHES °Q: What is a sinus headache? What causes it? How can I treat it? °A: When a cavity in the bones of the face and skull (a sinus) becomes inflamed, the inflammation will cause localized pain. This condition is usually the result of an allergic reaction, a tumor, or an infection. If your headache is caused by a sinus blockage, such as an infection, you will probably have a fever. An x-ray will confirm a sinus blockage. Your caregiver's treatment might include antibiotics for the infection, as well as antihistamines or decongestants.  °REBOUND HEADACHES °Q: What is a rebound headache? What causes it? How can I treat it? °A: A pattern of taking acute headache medications too often can lead to a condition known as "rebound headache."   A pattern of taking too much headache medication includes taking it more than 2 days per week or in excessive amounts. That means more than the label or a caregiver advises. With rebound  headaches, your medications not only stop relieving pain, they actually begin to cause headaches. Doctors treat rebound headache by tapering the medication that is being overused. Sometimes your caregiver will gradually substitute a different type of treatment or medication. Stopping may be a challenge. Regularly overusing a medication increases the potential for serious side effects. Consult a caregiver if you regularly use headache medications more than 2 days per week or more than the label advises. ADDITIONAL QUESTIONS AND ANSWERS Q: What is biofeedback? A: Biofeedback is a self-help treatment. Biofeedback uses special equipment to monitor your body's involuntary physical responses. Biofeedback monitors:  Breathing.  Pulse.  Heart rate.  Temperature.  Muscle tension.  Brain activity. Biofeedback helps you refine and perfect your relaxation exercises. You learn to control the physical responses that are related to stress. Once the technique has been mastered, you do not need the equipment any more. Q: Are headaches hereditary? A: Four out of five (80%) of people that suffer report a family history of migraine. Scientists are not sure if this is genetic or a family predisposition. Despite the uncertainty, a child has a 50% chance of having migraine if one parent suffers. The child has a 75% chance if both parents suffer.  Q: Can children get headaches? A: By the time they reach high school, most young people have experienced some type of headache. Many safe and effective approaches or medications can prevent a headache from occurring or stop it after it has begun.  Q: What type of doctor should I see to diagnose and treat my headache? A: Start with your primary caregiver. Discuss his or her experience and approach to headaches. Discuss methods of classification, diagnosis, and treatment. Your caregiver may decide to recommend you to a headache specialist, depending upon your symptoms or other  physical conditions. Having diabetes, allergies, etc., may require a more comprehensive and inclusive approach to your headache. The National Headache Foundation will provide, upon request, a list of [facility] physician members in your state. Document Released: 12/14/2003 Document Revised: 12/16/2011 Document Reviewed: 05/23/2008 [facility] Patient Information 2014 [place].   Visual Disturbances You have had a disturbance in your vision. This may be caused by various conditions, such as:  Migraines. Migraine headaches are often preceded by a disturbance in vision. Blind spots or light flashes are followed by a headache. This type of visual disturbance is temporary. It does not damage the eye.  Glaucoma. This is caused by increased pressure in the eye. Symptoms include haziness, blurred vision, or seeing rainbow colored circles when looking at bright lights. Partial or complete visual loss can occur. You may or may not experience eye pain. Visual loss may be gradual or sudden and is irreversible. Glaucoma is the leading cause of blindness.  Retina problems. Vision will be reduced if the retina becomes detached or if there is a circulation problem as with diabetes, high blood pressure, or a mini-stroke. Symptoms include seeing "floaters," flashes of light, or shadows, as if a curtain has fallen over your eye.  Optic nerve problems. The main nerve in your eye can be damaged by redness, soreness, and swelling (inflammation), poor circulation, drugs, and toxins. It is very important to have a complete exam done by a specialist to determine the exact cause of your eye problem. The specialist  may recommend medicines or surgery, depending on the cause of the problem. This can help prevent further loss of vision or reduce the risk of having a stroke. Contact the caregiver to whom you have been referred and arrange for follow-up care right away. SEEK IMMEDIATE MEDICAL CARE IF:   Your vision gets  worse.  You develop severe headaches.  You have any weakness or numbness in the face, arms, or legs.  You have any trouble speaking or walking. Document Released: 10/31/2004 Document Revised: 12/16/2011 Document Reviewed: 02/21/2010 [facility] Patient Information 2014 [place].

## 2013-10-13 NOTE — Progress Notes (Addendum)
CSW (Clinical Education officer, museum) spoke with pt and informed that [facility] is unable to offer placement. Pt is agreeable to being referred to [facility] and [facility]. CSW has sent referrals for both facilities and informed that pt is ready for dc today.   ADDENDUM 1:27pm: CSW updated pt that no facility pt is agreeable to is able to offer at this time. CSW discussed options with pt and suggested expanding search location. Pt is agreeable to be faxed to [facility]. CSW also spoke with pt daughter who called pt room during [place] conversation. Pt daughter does not feel as though pt will be safe at home with [facility] given previous fall history and new issues.  Tony, [place]

## 2013-10-13 NOTE — Progress Notes (Signed)
CSW (Clinical Education officer, museum) confirmed with facility that pt is able to dc and they have completed paperwork with family. DC packet prepared and placed with shadow chart. Pt son-in-law to arrive shortly at hospital to transport pt to facility. CSW informed pt nurse and asked to please provide family with dc packet. CSW signing off.  [place], [place]

## 2013-10-14 DIAGNOSIS — R51 Headache: Secondary | ICD-10-CM | POA: Diagnosis not present

## 2013-10-14 DIAGNOSIS — E785 Hyperlipidemia, unspecified: Secondary | ICD-10-CM | POA: Diagnosis not present

## 2013-10-14 DIAGNOSIS — I10 Essential (primary) hypertension: Secondary | ICD-10-CM | POA: Diagnosis not present

## 2013-10-14 DIAGNOSIS — I509 Heart failure, unspecified: Secondary | ICD-10-CM | POA: Diagnosis not present

## 2013-10-14 NOTE — Progress Notes (Signed)
i have reviewed the chart and agree with assessment and plan.

## 2013-10-18 DIAGNOSIS — H34239 Retinal artery branch occlusion, unspecified eye: Secondary | ICD-10-CM | POA: Diagnosis not present

## 2013-10-20 DIAGNOSIS — F330 Major depressive disorder, recurrent, mild: Secondary | ICD-10-CM | POA: Diagnosis not present

## 2013-10-20 DIAGNOSIS — F0280 Dementia in other diseases classified elsewhere without behavioral disturbance: Secondary | ICD-10-CM | POA: Diagnosis not present

## 2013-10-29 DIAGNOSIS — H547 Unspecified visual loss: Secondary | ICD-10-CM | POA: Diagnosis not present

## 2013-10-29 DIAGNOSIS — F329 Major depressive disorder, single episode, unspecified: Secondary | ICD-10-CM | POA: Diagnosis not present

## 2013-10-29 DIAGNOSIS — F3289 Other specified depressive episodes: Secondary | ICD-10-CM | POA: Diagnosis not present

## 2013-10-29 DIAGNOSIS — E785 Hyperlipidemia, unspecified: Secondary | ICD-10-CM | POA: Diagnosis not present

## 2013-11-03 DIAGNOSIS — I2510 Atherosclerotic heart disease of native coronary artery without angina pectoris: Secondary | ICD-10-CM | POA: Diagnosis not present

## 2013-11-03 DIAGNOSIS — I2789 Other specified pulmonary heart diseases: Secondary | ICD-10-CM | POA: Diagnosis not present

## 2013-11-03 DIAGNOSIS — H3530 Unspecified macular degeneration: Secondary | ICD-10-CM | POA: Diagnosis not present

## 2013-11-03 DIAGNOSIS — E785 Hyperlipidemia, unspecified: Secondary | ICD-10-CM | POA: Diagnosis not present

## 2013-11-03 DIAGNOSIS — F411 Generalized anxiety disorder: Secondary | ICD-10-CM | POA: Diagnosis not present

## 2013-11-03 DIAGNOSIS — I509 Heart failure, unspecified: Secondary | ICD-10-CM | POA: Diagnosis not present

## 2013-11-03 DIAGNOSIS — I10 Essential (primary) hypertension: Secondary | ICD-10-CM | POA: Diagnosis not present

## 2013-11-06 DIAGNOSIS — H3530 Unspecified macular degeneration: Secondary | ICD-10-CM | POA: Diagnosis not present

## 2013-11-06 DIAGNOSIS — I2510 Atherosclerotic heart disease of native coronary artery without angina pectoris: Secondary | ICD-10-CM | POA: Diagnosis not present

## 2013-11-06 DIAGNOSIS — I509 Heart failure, unspecified: Secondary | ICD-10-CM | POA: Diagnosis not present

## 2013-11-06 DIAGNOSIS — F411 Generalized anxiety disorder: Secondary | ICD-10-CM | POA: Diagnosis not present

## 2013-11-06 DIAGNOSIS — I2789 Other specified pulmonary heart diseases: Secondary | ICD-10-CM | POA: Diagnosis not present

## 2013-11-06 DIAGNOSIS — I10 Essential (primary) hypertension: Secondary | ICD-10-CM | POA: Diagnosis not present

## 2013-11-07 DIAGNOSIS — J069 Acute upper respiratory infection, unspecified: Secondary | ICD-10-CM | POA: Diagnosis not present

## 2013-11-07 DIAGNOSIS — J111 Influenza due to unidentified influenza virus with other respiratory manifestations: Secondary | ICD-10-CM | POA: Diagnosis not present

## 2013-11-08 DIAGNOSIS — I2510 Atherosclerotic heart disease of native coronary artery without angina pectoris: Secondary | ICD-10-CM | POA: Diagnosis not present

## 2013-11-08 DIAGNOSIS — I10 Essential (primary) hypertension: Secondary | ICD-10-CM | POA: Diagnosis not present

## 2013-11-08 DIAGNOSIS — I509 Heart failure, unspecified: Secondary | ICD-10-CM | POA: Diagnosis not present

## 2013-11-08 DIAGNOSIS — I2789 Other specified pulmonary heart diseases: Secondary | ICD-10-CM | POA: Diagnosis not present

## 2013-11-08 DIAGNOSIS — F411 Generalized anxiety disorder: Secondary | ICD-10-CM | POA: Diagnosis not present

## 2013-11-08 DIAGNOSIS — H3530 Unspecified macular degeneration: Secondary | ICD-10-CM | POA: Diagnosis not present

## 2013-11-08 NOTE — Discharge Summary (Signed)
Patient was seen and examined, reviewed the chart and agree with above note

## 2013-11-09 ENCOUNTER — Telehealth: Payer: Self-pay | Admitting: *Deleted

## 2013-11-09 DIAGNOSIS — I509 Heart failure, unspecified: Secondary | ICD-10-CM | POA: Diagnosis not present

## 2013-11-09 DIAGNOSIS — H3530 Unspecified macular degeneration: Secondary | ICD-10-CM | POA: Diagnosis not present

## 2013-11-09 DIAGNOSIS — I2510 Atherosclerotic heart disease of native coronary artery without angina pectoris: Secondary | ICD-10-CM | POA: Diagnosis not present

## 2013-11-09 DIAGNOSIS — I10 Essential (primary) hypertension: Secondary | ICD-10-CM | POA: Diagnosis not present

## 2013-11-09 DIAGNOSIS — F411 Generalized anxiety disorder: Secondary | ICD-10-CM | POA: Diagnosis not present

## 2013-11-09 DIAGNOSIS — I2789 Other specified pulmonary heart diseases: Secondary | ICD-10-CM | POA: Diagnosis not present

## 2013-11-09 NOTE — Telephone Encounter (Signed)
[name] notified (verbal order given)

## 2013-11-09 NOTE — Telephone Encounter (Signed)
ok 

## 2013-11-09 NOTE — Telephone Encounter (Signed)
[name] with [facility] called to report that [name] was seen at [facility] recently & went to walk in and tested positive for the flu. Requesting a verbal order for health health aid to help with bathing.

## 2013-11-10 ENCOUNTER — Telehealth: Payer: Self-pay | Admitting: *Deleted

## 2013-11-10 DIAGNOSIS — I2510 Atherosclerotic heart disease of native coronary artery without angina pectoris: Secondary | ICD-10-CM | POA: Diagnosis not present

## 2013-11-10 DIAGNOSIS — I2789 Other specified pulmonary heart diseases: Secondary | ICD-10-CM | POA: Diagnosis not present

## 2013-11-10 DIAGNOSIS — I509 Heart failure, unspecified: Secondary | ICD-10-CM | POA: Diagnosis not present

## 2013-11-10 DIAGNOSIS — I10 Essential (primary) hypertension: Secondary | ICD-10-CM | POA: Diagnosis not present

## 2013-11-10 DIAGNOSIS — H3530 Unspecified macular degeneration: Secondary | ICD-10-CM | POA: Diagnosis not present

## 2013-11-10 DIAGNOSIS — F411 Generalized anxiety disorder: Secondary | ICD-10-CM | POA: Diagnosis not present

## 2013-11-10 NOTE — Telephone Encounter (Signed)
[name] with [facility] called to inform you that [name] was diagnosed with CHF in the hospital, & the family is unaware of any heart issues. [name] is requesting a call back regarding this matter.

## 2013-11-11 DIAGNOSIS — I10 Essential (primary) hypertension: Secondary | ICD-10-CM | POA: Diagnosis not present

## 2013-11-11 DIAGNOSIS — I2789 Other specified pulmonary heart diseases: Secondary | ICD-10-CM | POA: Diagnosis not present

## 2013-11-11 DIAGNOSIS — F411 Generalized anxiety disorder: Secondary | ICD-10-CM | POA: Diagnosis not present

## 2013-11-11 DIAGNOSIS — H3530 Unspecified macular degeneration: Secondary | ICD-10-CM | POA: Diagnosis not present

## 2013-11-11 DIAGNOSIS — I509 Heart failure, unspecified: Secondary | ICD-10-CM | POA: Diagnosis not present

## 2013-11-11 DIAGNOSIS — I2510 Atherosclerotic heart disease of native coronary artery without angina pectoris: Secondary | ICD-10-CM | POA: Diagnosis not present

## 2013-11-11 NOTE — Telephone Encounter (Signed)
Records requested

## 2013-11-11 NOTE — Telephone Encounter (Signed)
Need records from hospitalization to review regarding her current heart w/up.  Previously her stress test was ok.  She has been evaluated by cardiology here (Dr Ubaldo Glassing) and Dr Manuella Ghazi in [facility] - and per their report no acute abnormality found with her heart.  I am not sure what other information Chrisy needs.  Heart issues can change and if pts are sick, sometime fluid/volume status can change.

## 2013-11-11 NOTE — Telephone Encounter (Signed)
Pt was seen at [facility] (notes in system)

## 2013-11-12 DIAGNOSIS — I2789 Other specified pulmonary heart diseases: Secondary | ICD-10-CM | POA: Diagnosis not present

## 2013-11-12 DIAGNOSIS — H3530 Unspecified macular degeneration: Secondary | ICD-10-CM | POA: Diagnosis not present

## 2013-11-12 DIAGNOSIS — I10 Essential (primary) hypertension: Secondary | ICD-10-CM | POA: Diagnosis not present

## 2013-11-12 DIAGNOSIS — I509 Heart failure, unspecified: Secondary | ICD-10-CM | POA: Diagnosis not present

## 2013-11-12 DIAGNOSIS — F411 Generalized anxiety disorder: Secondary | ICD-10-CM | POA: Diagnosis not present

## 2013-11-12 DIAGNOSIS — I2510 Atherosclerotic heart disease of native coronary artery without angina pectoris: Secondary | ICD-10-CM | POA: Diagnosis not present

## 2013-11-12 NOTE — Telephone Encounter (Signed)
Per records from [facility], I do not see any mention of CHF.  Did have an ECHO because of concern of stroke, but there was no mention of acute CHF.  I am not sure what family told.

## 2013-11-12 NOTE — Telephone Encounter (Signed)
Christy notified.

## 2013-11-15 DIAGNOSIS — I509 Heart failure, unspecified: Secondary | ICD-10-CM | POA: Diagnosis not present

## 2013-11-15 DIAGNOSIS — I2510 Atherosclerotic heart disease of native coronary artery without angina pectoris: Secondary | ICD-10-CM | POA: Diagnosis not present

## 2013-11-15 DIAGNOSIS — F411 Generalized anxiety disorder: Secondary | ICD-10-CM | POA: Diagnosis not present

## 2013-11-15 DIAGNOSIS — I10 Essential (primary) hypertension: Secondary | ICD-10-CM | POA: Diagnosis not present

## 2013-11-15 DIAGNOSIS — I2789 Other specified pulmonary heart diseases: Secondary | ICD-10-CM | POA: Diagnosis not present

## 2013-11-15 DIAGNOSIS — H3530 Unspecified macular degeneration: Secondary | ICD-10-CM | POA: Diagnosis not present

## 2013-11-16 DIAGNOSIS — F411 Generalized anxiety disorder: Secondary | ICD-10-CM | POA: Diagnosis not present

## 2013-11-16 DIAGNOSIS — I10 Essential (primary) hypertension: Secondary | ICD-10-CM | POA: Diagnosis not present

## 2013-11-16 DIAGNOSIS — F0280 Dementia in other diseases classified elsewhere without behavioral disturbance: Secondary | ICD-10-CM | POA: Diagnosis not present

## 2013-11-16 DIAGNOSIS — I2789 Other specified pulmonary heart diseases: Secondary | ICD-10-CM | POA: Diagnosis not present

## 2013-11-16 DIAGNOSIS — I2510 Atherosclerotic heart disease of native coronary artery without angina pectoris: Secondary | ICD-10-CM | POA: Diagnosis not present

## 2013-11-16 DIAGNOSIS — F330 Major depressive disorder, recurrent, mild: Secondary | ICD-10-CM | POA: Diagnosis not present

## 2013-11-16 DIAGNOSIS — H3530 Unspecified macular degeneration: Secondary | ICD-10-CM | POA: Diagnosis not present

## 2013-11-16 DIAGNOSIS — I509 Heart failure, unspecified: Secondary | ICD-10-CM | POA: Diagnosis not present

## 2013-11-17 DIAGNOSIS — H3530 Unspecified macular degeneration: Secondary | ICD-10-CM | POA: Diagnosis not present

## 2013-11-17 DIAGNOSIS — I509 Heart failure, unspecified: Secondary | ICD-10-CM | POA: Diagnosis not present

## 2013-11-17 DIAGNOSIS — I10 Essential (primary) hypertension: Secondary | ICD-10-CM | POA: Diagnosis not present

## 2013-11-17 DIAGNOSIS — I2510 Atherosclerotic heart disease of native coronary artery without angina pectoris: Secondary | ICD-10-CM | POA: Diagnosis not present

## 2013-11-17 DIAGNOSIS — I2789 Other specified pulmonary heart diseases: Secondary | ICD-10-CM | POA: Diagnosis not present

## 2013-11-17 DIAGNOSIS — F411 Generalized anxiety disorder: Secondary | ICD-10-CM | POA: Diagnosis not present

## 2013-11-19 ENCOUNTER — Ambulatory Visit (INDEPENDENT_AMBULATORY_CARE_PROVIDER_SITE_OTHER): Payer: Medicare Other | Admitting: Internal Medicine

## 2013-11-19 ENCOUNTER — Telehealth: Payer: Self-pay | Admitting: Internal Medicine

## 2013-11-19 ENCOUNTER — Encounter: Payer: Self-pay | Admitting: Internal Medicine

## 2013-11-19 ENCOUNTER — Encounter (INDEPENDENT_AMBULATORY_CARE_PROVIDER_SITE_OTHER): Payer: Self-pay

## 2013-11-19 ENCOUNTER — Ambulatory Visit (INDEPENDENT_AMBULATORY_CARE_PROVIDER_SITE_OTHER)
Admission: RE | Admit: 2013-11-19 | Discharge: 2013-11-19 | Disposition: A | Discharge status: Home / Self Care | Payer: Medicare Other | Source: Ambulatory Visit | Attending: Internal Medicine | Admitting: Internal Medicine

## 2013-11-19 VITALS — BP 122/60 | HR 85 | Temp 98.3°F | Ht 62.0 in | Wt 155.0 lb

## 2013-11-19 DIAGNOSIS — R05 Cough: Secondary | ICD-10-CM

## 2013-11-19 DIAGNOSIS — R5381 Other malaise: Secondary | ICD-10-CM

## 2013-11-19 DIAGNOSIS — I10 Essential (primary) hypertension: Secondary | ICD-10-CM

## 2013-11-19 DIAGNOSIS — H34239 Retinal artery branch occlusion, unspecified eye: Secondary | ICD-10-CM | POA: Diagnosis not present

## 2013-11-19 DIAGNOSIS — I272 Pulmonary hypertension, unspecified: Secondary | ICD-10-CM

## 2013-11-19 DIAGNOSIS — F32A Depression, unspecified: Secondary | ICD-10-CM

## 2013-11-19 DIAGNOSIS — F3289 Other specified depressive episodes: Secondary | ICD-10-CM

## 2013-11-19 DIAGNOSIS — I2789 Other specified pulmonary heart diseases: Secondary | ICD-10-CM

## 2013-11-19 DIAGNOSIS — Z1239 Encounter for other screening for malignant neoplasm of breast: Secondary | ICD-10-CM

## 2013-11-19 DIAGNOSIS — R059 Cough, unspecified: Secondary | ICD-10-CM

## 2013-11-19 DIAGNOSIS — F419 Anxiety disorder, unspecified: Secondary | ICD-10-CM

## 2013-11-19 DIAGNOSIS — H547 Unspecified visual loss: Secondary | ICD-10-CM

## 2013-11-19 DIAGNOSIS — G479 Sleep disorder, unspecified: Secondary | ICD-10-CM

## 2013-11-19 DIAGNOSIS — F329 Major depressive disorder, single episode, unspecified: Secondary | ICD-10-CM

## 2013-11-19 DIAGNOSIS — E7800 Pure hypercholesterolemia, unspecified: Secondary | ICD-10-CM

## 2013-11-19 DIAGNOSIS — R51 Headache: Secondary | ICD-10-CM

## 2013-11-19 DIAGNOSIS — R42 Dizziness and giddiness: Secondary | ICD-10-CM

## 2013-11-19 DIAGNOSIS — R5383 Other fatigue: Secondary | ICD-10-CM

## 2013-11-19 DIAGNOSIS — F411 Generalized anxiety disorder: Secondary | ICD-10-CM

## 2013-11-19 DIAGNOSIS — J40 Bronchitis, not specified as acute or chronic: Secondary | ICD-10-CM

## 2013-11-19 LAB — CBC WITH DIFFERENTIAL/PLATELET
BASOS ABS: 0.1 10*3/uL (ref 0.0–0.1)
Basophils Relative: 0.7 % (ref 0.0–3.0)
EOS PCT: 1.6 % (ref 0.0–5.0)
Eosinophils Absolute: 0.2 10*3/uL (ref 0.0–0.7)
HCT: 39.1 % (ref 36.0–46.0)
HEMOGLOBIN: 12.6 g/dL (ref 12.0–15.0)
LYMPHS PCT: 19.6 % (ref 12.0–46.0)
Lymphs Abs: 2 10*3/uL (ref 0.7–4.0)
MCHC: 32.2 g/dL (ref 30.0–36.0)
MCV: 92 fl (ref 78.0–100.0)
Monocytes Absolute: 0.5 10*3/uL (ref 0.1–1.0)
Monocytes Relative: 4.4 % (ref 3.0–12.0)
Neutro Abs: 7.5 10*3/uL (ref 1.4–7.7)
Neutrophils Relative %: 73.7 % (ref 43.0–77.0)
Platelets: 390 10*3/uL (ref 150.0–400.0)
RBC: 4.25 Mil/uL (ref 3.87–5.11)
RDW: 14.5 % (ref 11.5–14.6)
WBC: 10.2 10*3/uL (ref 4.5–10.5)

## 2013-11-19 LAB — COMPREHENSIVE METABOLIC PANEL
ALT: 11 U/L (ref 0–35)
AST: 16 U/L (ref 0–37)
Albumin: 3.6 g/dL (ref 3.5–5.2)
Alkaline Phosphatase: 126 U/L — ABNORMAL HIGH (ref 39–117)
BUN: 16 mg/dL (ref 6–23)
CALCIUM: 9.7 mg/dL (ref 8.4–10.5)
CO2: 24 meq/L (ref 19–32)
Chloride: 109 mEq/L (ref 96–112)
Creatinine, Ser: 1.1 mg/dL (ref 0.4–1.2)
GFR: 52.86 mL/min — AB (ref >60.00)
Glucose, Bld: 103 mg/dL — ABNORMAL HIGH (ref 70–99)
Potassium: 3.9 mEq/L (ref 3.5–5.1)
SODIUM: 141 meq/L (ref 135–145)
TOTAL PROTEIN: 7.3 g/dL (ref 6.0–8.3)
Total Bilirubin: 0.7 mg/dL (ref 0.3–1.2)

## 2013-11-19 LAB — TSH: TSH: 0.97 u[IU]/mL (ref 0.35–5.50)

## 2013-11-19 MED ORDER — CEFDINIR 300 MG PO CAPS: 300.0000 mg | ORAL_CAPSULE | Freq: Two times a day (BID) | ORAL | Status: DC

## 2013-11-19 NOTE — Progress Notes (Signed)
Pre-visit discussion using our clinic review tool. No additional management support is needed unless otherwise documented below in the visit note.  

## 2013-11-19 NOTE — Telephone Encounter (Signed)
Nancy left vm, calling from [place] [facility] to report that the pt refused home visit for Friday 2/13 due to having two other doctor's appointments scheduled.  Izora Gala will move pt to next week.  Pt will have two visits next week by [facility].  Izora Gala needs a call to confirm that this is okay.

## 2013-11-19 NOTE — Telephone Encounter (Signed)
Verbal okay given to [employer]

## 2013-11-20 ENCOUNTER — Other Ambulatory Visit: Payer: Self-pay | Admitting: Internal Medicine

## 2013-11-20 DIAGNOSIS — I10 Essential (primary) hypertension: Secondary | ICD-10-CM

## 2013-11-20 DIAGNOSIS — E7800 Pure hypercholesterolemia, unspecified: Secondary | ICD-10-CM

## 2013-11-20 NOTE — Progress Notes (Signed)
Orders placed for f/u labs.  

## 2013-11-21 ENCOUNTER — Encounter: Payer: Self-pay | Admitting: Internal Medicine

## 2013-11-21 NOTE — Progress Notes (Signed)
Subjective:    Patient ID: [name], female    DOB: 1926-02-12, 80 y.o.   MRN: [id number]  Cough  80 year old female with past history of uterine and vulvar cancer, hypertension and hypercholesterolemia who comes in today for a scheduled follow up.  She is accompanied by her daughter.  History obtained from both of them.  Has been having issues with increased depression and increased stress.  Seeing Dr Toy Care.  On zoloft and trazodone.  She is still having the pain behind the left eye.  Has had ENT evaluation and has seen two neurologists.  Has had MRI.  Unclear as to the exact etiology.  Just recently admitted with loss of visual lower visual field.  Seeing Dr Wallace Going.  Remains on aspirin.  She does report some increased cough - mostly dry.  Some increased pressure in her sinuses.  No acid reflux.  No nausea or vomiting.  Bowels stable.        Past Medical History  Diagnosis Date  . Cancer     uterine and questionable vulvar, s/p hysterectomy  . Hypercholesterolemia   . Chronic headaches   . GERD (gastroesophageal reflux disease)   . Glaucoma   . Arthritis   . Right bundle branch block   . Reactive airway disease   . Cystocele   . Rectocele   . Hypertension   . Macular degeneration     Current Outpatient Prescriptions on File Prior to Visit  Medication Sig Dispense Refill  . acetaminophen (TYLENOL) 325 MG tablet Take 2 tablets (650 mg total) by mouth every 4 (four) hours as needed for mild pain, fever or headache.      [name] aspirin 325 MG tablet Take 1 tablet (325 mg total) by mouth daily.      [name] atorvastatin (LIPITOR) 40 MG tablet Take 1 tablet (40 mg total) by mouth daily at 6 PM.      . brinzolamide (AZOPT) 1 % ophthalmic suspension Place 1 drop into the left eye 3 (three) times daily.  10 mL  12  . furosemide (LASIX) 20 MG tablet Take 1 tablet (20 mg total) by mouth daily.  30 tablet    . latanoprost (XALATAN) 0.005 % ophthalmic solution Place 1 drop into both eyes at  bedtime.  2.5 mL  12  . Multiple Vitamin (MULTIVITAMIN) tablet Take 1 tablet by mouth daily.      . Multiple Vitamins-Minerals (PRESERVISION AREDS) TABS Take 1 tablet by mouth every morning.      . Omega-3 Fatty Acids (FISH OIL CONCENTRATE PO) Take 1 capsule by mouth 2 (two) times daily. 1750mg       . omeprazole (PRILOSEC) 20 MG capsule Take 20 mg by mouth every morning.      . sertraline (ZOLOFT) 100 MG tablet Take 100 mg by mouth daily.      . travoprost, benzalkonium, (TRAVATAN) 0.004 % ophthalmic solution Place 1 drop into both eyes at bedtime.      . vitamin D, CHOLECALCIFEROL, 400 UNITS tablet Take 400 Units by mouth daily.       No current facility-administered medications on file prior to visit.    Review of Systems  Respiratory: Positive for cough.   still having the head pressure as outlined.  No increased sinus congestion or drainage.  Using flonase daily.  No chest pain or tightness.  Breathing stable.  Increased cough and congestion.  No acid reflux.  No nausea or vomiting.  No abdominal pain or cramping.  No bowel change.  Recently admitted with lower visual field loss.         Objective:   Physical Exam  Filed Vitals:   11/19/13 0933  BP: 122/60  Pulse: 85  Temp: 98.3 F (16.67 C)   80 year old female in no acute distress.   HEENT:  Nares- clear.  Oropharynx - without lesions.  No significant tenderness to palpation over the sinus/temporal region. NECK:  Supple.  Nontender.  No audible bruit.  HEART:  Appears to be regular. LUNGS:  No crackles or wheezing audible.  Respirations even and unlabored.  RADIAL PULSE:  Equal bilaterally.   ABDOMEN:  Soft, nontender.  Bowel sounds present and normal.  No audible abdominal bruit.   EXTREMITIES:  No increased edema present.  DP pulses palpable and equal bilaterally.          Assessment & Plan:  GI.  Colonoscopy 03/05/05 with diverticulosis.  Currently doing well.   CARDIOVASCULAR.  Stress test 12/18/10 revealed no ischemia.   ECHO 12/18/10 revealed EF 60% with mild RV and RA enlargement, mild LVH, mild mitral and tricuspid insufficiency with documented mild pulmonary hypertension.  Saw Dr Ubaldo Glassing.  Felt no further w/up warranted.  Has recently been reevaluated by Dr Manuella Ghazi - cardiology with [facility].  States everything has checked out fine.  Had f/u ECHO in the hospital.  Mild diastolic dysfunction.  Follow.  Currently doing well.    KNEE PAIN.  Seeing Dr Latanya Maudlin.    HEALTH MAINTENANCE.  Up to date with her physicals.    I spent 40 minutes with the patient and more than 50% of the time was spent in consultation regarding the above.

## 2013-11-21 NOTE — Assessment & Plan Note (Signed)
Low cholesterol diet.  Continue current medication regimen.  Follow lipid panel and liver function.   

## 2013-11-21 NOTE — Assessment & Plan Note (Signed)
Has seen Dr  Ubaldo Glassing and Dr Raul Del.  Just had f/u ECHO in the hospital.  Follow.

## 2013-11-21 NOTE — Assessment & Plan Note (Signed)
Increased cough and congestion.  No acid reflux.  Treat the infection with omnicef as directed.  Flonase and saline nasal spray as directed.  Follow.

## 2013-11-21 NOTE — Assessment & Plan Note (Signed)
On zoloft and trazodone.  Seeing Dr Kapur.  Follow.    

## 2013-11-21 NOTE — Assessment & Plan Note (Signed)
Visual field defect - lower 1/2.  Worked up in the hospital.  On aspirin.  Follow.

## 2013-11-21 NOTE — Assessment & Plan Note (Signed)
Occasional light headedness.  Treat the infection.  Has seen ENT and neuro.  Follow.

## 2013-11-21 NOTE — Assessment & Plan Note (Signed)
Persistent intermittent headache.   Has previously had MRI.  Unrevealing.  Has seen neurology, ENT and opthalmology.  No clear etiology found.  Some form of headache present now for one year.  Previously tried on prednisone for treatment of possible temporal arteritis.  Biopsy negative.  No change previously on high dose prednisone.  She has declined referral back to neurology.

## 2013-11-21 NOTE — Assessment & Plan Note (Signed)
Blood pressure doing well on no medication.  Follow.   

## 2013-11-21 NOTE — Assessment & Plan Note (Signed)
On trazodone and zoloft.   Follow.   

## 2013-11-21 NOTE — Assessment & Plan Note (Signed)
Seeing Dr Kapur.  On zoloft and trazodone.  Follow.   

## 2013-11-22 ENCOUNTER — Encounter: Payer: Self-pay | Admitting: *Deleted

## 2013-11-22 DIAGNOSIS — H3530 Unspecified macular degeneration: Secondary | ICD-10-CM | POA: Diagnosis not present

## 2013-11-22 DIAGNOSIS — I2789 Other specified pulmonary heart diseases: Secondary | ICD-10-CM | POA: Diagnosis not present

## 2013-11-22 DIAGNOSIS — I2510 Atherosclerotic heart disease of native coronary artery without angina pectoris: Secondary | ICD-10-CM | POA: Diagnosis not present

## 2013-11-22 DIAGNOSIS — I10 Essential (primary) hypertension: Secondary | ICD-10-CM | POA: Diagnosis not present

## 2013-11-22 DIAGNOSIS — F411 Generalized anxiety disorder: Secondary | ICD-10-CM | POA: Diagnosis not present

## 2013-11-22 DIAGNOSIS — I509 Heart failure, unspecified: Secondary | ICD-10-CM | POA: Diagnosis not present

## 2013-11-24 ENCOUNTER — Telehealth: Payer: Self-pay | Admitting: Internal Medicine

## 2013-11-24 DIAGNOSIS — H3530 Unspecified macular degeneration: Secondary | ICD-10-CM | POA: Diagnosis not present

## 2013-11-24 DIAGNOSIS — I2510 Atherosclerotic heart disease of native coronary artery without angina pectoris: Secondary | ICD-10-CM | POA: Diagnosis not present

## 2013-11-24 DIAGNOSIS — F411 Generalized anxiety disorder: Secondary | ICD-10-CM | POA: Diagnosis not present

## 2013-11-24 DIAGNOSIS — I2789 Other specified pulmonary heart diseases: Secondary | ICD-10-CM | POA: Diagnosis not present

## 2013-11-24 DIAGNOSIS — I10 Essential (primary) hypertension: Secondary | ICD-10-CM | POA: Diagnosis not present

## 2013-11-24 DIAGNOSIS — I509 Heart failure, unspecified: Secondary | ICD-10-CM | POA: Diagnosis not present

## 2013-11-24 NOTE — Telephone Encounter (Signed)
Left verbal order on voicemail 

## 2013-11-24 NOTE — Telephone Encounter (Signed)
Ok to extend
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
## 2013-11-25 DIAGNOSIS — I2789 Other specified pulmonary heart diseases: Secondary | ICD-10-CM | POA: Diagnosis not present

## 2013-11-25 DIAGNOSIS — I10 Essential (primary) hypertension: Secondary | ICD-10-CM | POA: Diagnosis not present

## 2013-11-25 DIAGNOSIS — H3530 Unspecified macular degeneration: Secondary | ICD-10-CM | POA: Diagnosis not present

## 2013-11-25 DIAGNOSIS — I509 Heart failure, unspecified: Secondary | ICD-10-CM | POA: Diagnosis not present

## 2013-11-25 DIAGNOSIS — F411 Generalized anxiety disorder: Secondary | ICD-10-CM | POA: Diagnosis not present

## 2013-11-25 DIAGNOSIS — I2510 Atherosclerotic heart disease of native coronary artery without angina pectoris: Secondary | ICD-10-CM | POA: Diagnosis not present

## 2013-11-26 DIAGNOSIS — I509 Heart failure, unspecified: Secondary | ICD-10-CM | POA: Diagnosis not present

## 2013-11-26 DIAGNOSIS — I10 Essential (primary) hypertension: Secondary | ICD-10-CM | POA: Diagnosis not present

## 2013-11-26 DIAGNOSIS — I2510 Atherosclerotic heart disease of native coronary artery without angina pectoris: Secondary | ICD-10-CM | POA: Diagnosis not present

## 2013-11-26 DIAGNOSIS — H3530 Unspecified macular degeneration: Secondary | ICD-10-CM | POA: Diagnosis not present

## 2013-11-26 DIAGNOSIS — F411 Generalized anxiety disorder: Secondary | ICD-10-CM | POA: Diagnosis not present

## 2013-11-26 DIAGNOSIS — I2789 Other specified pulmonary heart diseases: Secondary | ICD-10-CM | POA: Diagnosis not present

## 2013-11-29 ENCOUNTER — Encounter: Payer: Self-pay | Admitting: *Deleted

## 2013-11-29 DIAGNOSIS — F411 Generalized anxiety disorder: Secondary | ICD-10-CM | POA: Diagnosis not present

## 2013-11-29 DIAGNOSIS — I509 Heart failure, unspecified: Secondary | ICD-10-CM | POA: Diagnosis not present

## 2013-11-29 DIAGNOSIS — I2510 Atherosclerotic heart disease of native coronary artery without angina pectoris: Secondary | ICD-10-CM | POA: Diagnosis not present

## 2013-11-29 DIAGNOSIS — I2789 Other specified pulmonary heart diseases: Secondary | ICD-10-CM | POA: Diagnosis not present

## 2013-11-29 DIAGNOSIS — I10 Essential (primary) hypertension: Secondary | ICD-10-CM | POA: Diagnosis not present

## 2013-11-29 DIAGNOSIS — H3530 Unspecified macular degeneration: Secondary | ICD-10-CM | POA: Diagnosis not present

## 2013-12-01 DIAGNOSIS — I2510 Atherosclerotic heart disease of native coronary artery without angina pectoris: Secondary | ICD-10-CM | POA: Diagnosis not present

## 2013-12-01 DIAGNOSIS — I2789 Other specified pulmonary heart diseases: Secondary | ICD-10-CM | POA: Diagnosis not present

## 2013-12-01 DIAGNOSIS — I10 Essential (primary) hypertension: Secondary | ICD-10-CM | POA: Diagnosis not present

## 2013-12-01 DIAGNOSIS — H3530 Unspecified macular degeneration: Secondary | ICD-10-CM | POA: Diagnosis not present

## 2013-12-01 DIAGNOSIS — I509 Heart failure, unspecified: Secondary | ICD-10-CM | POA: Diagnosis not present

## 2013-12-01 DIAGNOSIS — F411 Generalized anxiety disorder: Secondary | ICD-10-CM | POA: Diagnosis not present

## 2013-12-02 DIAGNOSIS — H3530 Unspecified macular degeneration: Secondary | ICD-10-CM | POA: Diagnosis not present

## 2013-12-02 DIAGNOSIS — I2510 Atherosclerotic heart disease of native coronary artery without angina pectoris: Secondary | ICD-10-CM | POA: Diagnosis not present

## 2013-12-02 DIAGNOSIS — I10 Essential (primary) hypertension: Secondary | ICD-10-CM | POA: Diagnosis not present

## 2013-12-02 DIAGNOSIS — I2789 Other specified pulmonary heart diseases: Secondary | ICD-10-CM | POA: Diagnosis not present

## 2013-12-02 DIAGNOSIS — I509 Heart failure, unspecified: Secondary | ICD-10-CM | POA: Diagnosis not present

## 2013-12-02 DIAGNOSIS — F411 Generalized anxiety disorder: Secondary | ICD-10-CM | POA: Diagnosis not present

## 2013-12-03 DIAGNOSIS — I2789 Other specified pulmonary heart diseases: Secondary | ICD-10-CM | POA: Diagnosis not present

## 2013-12-03 DIAGNOSIS — I10 Essential (primary) hypertension: Secondary | ICD-10-CM | POA: Diagnosis not present

## 2013-12-03 DIAGNOSIS — F411 Generalized anxiety disorder: Secondary | ICD-10-CM | POA: Diagnosis not present

## 2013-12-03 DIAGNOSIS — I509 Heart failure, unspecified: Secondary | ICD-10-CM | POA: Diagnosis not present

## 2013-12-03 DIAGNOSIS — H3530 Unspecified macular degeneration: Secondary | ICD-10-CM | POA: Diagnosis not present

## 2013-12-03 DIAGNOSIS — I2510 Atherosclerotic heart disease of native coronary artery without angina pectoris: Secondary | ICD-10-CM | POA: Diagnosis not present

## 2013-12-06 ENCOUNTER — Other Ambulatory Visit: Payer: Medicare Other

## 2013-12-06 ENCOUNTER — Telehealth: Payer: Self-pay | Admitting: *Deleted

## 2013-12-06 ENCOUNTER — Other Ambulatory Visit: Payer: Self-pay | Admitting: *Deleted

## 2013-12-06 DIAGNOSIS — I10 Essential (primary) hypertension: Secondary | ICD-10-CM | POA: Diagnosis not present

## 2013-12-06 DIAGNOSIS — I2789 Other specified pulmonary heart diseases: Secondary | ICD-10-CM | POA: Diagnosis not present

## 2013-12-06 DIAGNOSIS — F411 Generalized anxiety disorder: Secondary | ICD-10-CM | POA: Diagnosis not present

## 2013-12-06 DIAGNOSIS — I2510 Atherosclerotic heart disease of native coronary artery without angina pectoris: Secondary | ICD-10-CM | POA: Diagnosis not present

## 2013-12-06 DIAGNOSIS — I509 Heart failure, unspecified: Secondary | ICD-10-CM | POA: Diagnosis not present

## 2013-12-06 DIAGNOSIS — H3530 Unspecified macular degeneration: Secondary | ICD-10-CM | POA: Diagnosis not present

## 2013-12-06 MED ORDER — ATORVASTATIN CALCIUM 40 MG PO TABS: 40.0000 mg | ORAL_TABLET | Freq: Every day | ORAL | Status: DC

## 2013-12-06 NOTE — Telephone Encounter (Signed)
Please confirm that she did not hit her head.  If no, monitor for any problems.  If hit head, would like her evaluated.

## 2013-12-06 NOTE — Telephone Encounter (Signed)
Left message for Nancy to return my call. °

## 2013-12-06 NOTE — Telephone Encounter (Signed)
[name] from [facility] called to report that Ms. Vaccaro fell yesterday while bending over in the kitchen (fell backwards). She reports no injury & walking okay. [name] went over the fall prevention protocol with patient & she verbalized understanding.

## 2013-12-08 ENCOUNTER — Telehealth: Payer: Self-pay | Admitting: Internal Medicine

## 2013-12-08 ENCOUNTER — Ambulatory Visit: Payer: Self-pay | Admitting: Internal Medicine

## 2013-12-08 ENCOUNTER — Other Ambulatory Visit: Payer: Medicare Other

## 2013-12-08 DIAGNOSIS — Z1231 Encounter for screening mammogram for malignant neoplasm of breast: Secondary | ICD-10-CM | POA: Diagnosis not present

## 2013-12-08 LAB — HM MAMMOGRAPHY: HM Mammogram: NEGATIVE

## 2013-12-08 NOTE — Telephone Encounter (Signed)
With all of these issues and the schedule on Friday am - it would probably be better for me to see her on Thursday 11:45-12:00.  See if they can come then.

## 2013-12-08 NOTE — Telephone Encounter (Signed)
States pt seems to be having all side effects to medication atorvastatin calcium 40 mg tabs except for rash and allergic reaction.  States her confusion seems much worse and she is having a lot of dizziness.  Asking if the med can be changed.

## 2013-12-08 NOTE — Telephone Encounter (Signed)
She did not hit her head

## 2013-12-08 NOTE — Telephone Encounter (Signed)
Noted  

## 2013-12-08 NOTE — Telephone Encounter (Signed)
Spoke with Mr. Mesenbrink & he states that she also wants to be checked for a UTI. And they have a fasting lab appt on Tues. & not sure why because she is no longer on the Lasix. The note mentioned that you wanted to recheck her renal fxn to be sure that she is remaining stable on the lasix. I offered them the slot on Friday morning (15 min). I told them if you would like them to come at a different time that I would like them know. I also told him that you would discuss the Lasix & lab question with them at that time.

## 2013-12-08 NOTE — Telephone Encounter (Signed)
Pt coming in tomorrow (has to move hair appt around be will make appt)

## 2013-12-08 NOTE — Telephone Encounter (Signed)
I don't fully understand this message.  States "except for rash and allergic rxn"  I am ok if she stops the medication temporarily, but I am not sure that the confusion and the dizziness are a side effect of the medication.  With these symptoms, it sounds like she needs to be reevaluated.  Can schedule appt asap.  If acute symptoms - needs evaluation today.  But, as far as stopping the medication - I am ok with her stopping the medication.  Would hold on adding new medication until get this sorted through.

## 2013-12-09 ENCOUNTER — Ambulatory Visit (INDEPENDENT_AMBULATORY_CARE_PROVIDER_SITE_OTHER): Payer: Medicare Other | Admitting: Internal Medicine

## 2013-12-09 ENCOUNTER — Encounter: Payer: Self-pay | Admitting: Internal Medicine

## 2013-12-09 VITALS — BP 140/80 | HR 75 | Temp 98.0°F | Ht 62.0 in | Wt 156.5 lb

## 2013-12-09 DIAGNOSIS — H3530 Unspecified macular degeneration: Secondary | ICD-10-CM | POA: Diagnosis not present

## 2013-12-09 DIAGNOSIS — F3289 Other specified depressive episodes: Secondary | ICD-10-CM

## 2013-12-09 DIAGNOSIS — I272 Pulmonary hypertension, unspecified: Secondary | ICD-10-CM

## 2013-12-09 DIAGNOSIS — F411 Generalized anxiety disorder: Secondary | ICD-10-CM

## 2013-12-09 DIAGNOSIS — R350 Frequency of micturition: Secondary | ICD-10-CM

## 2013-12-09 DIAGNOSIS — M542 Cervicalgia: Secondary | ICD-10-CM

## 2013-12-09 DIAGNOSIS — E7800 Pure hypercholesterolemia, unspecified: Secondary | ICD-10-CM | POA: Diagnosis not present

## 2013-12-09 DIAGNOSIS — F419 Anxiety disorder, unspecified: Secondary | ICD-10-CM

## 2013-12-09 DIAGNOSIS — I2789 Other specified pulmonary heart diseases: Secondary | ICD-10-CM

## 2013-12-09 DIAGNOSIS — N649 Disorder of breast, unspecified: Secondary | ICD-10-CM

## 2013-12-09 DIAGNOSIS — L988 Other specified disorders of the skin and subcutaneous tissue: Secondary | ICD-10-CM

## 2013-12-09 DIAGNOSIS — R42 Dizziness and giddiness: Secondary | ICD-10-CM | POA: Diagnosis not present

## 2013-12-09 DIAGNOSIS — F329 Major depressive disorder, single episode, unspecified: Secondary | ICD-10-CM

## 2013-12-09 DIAGNOSIS — I10 Essential (primary) hypertension: Secondary | ICD-10-CM | POA: Diagnosis not present

## 2013-12-09 DIAGNOSIS — Z87440 Personal history of urinary (tract) infections: Secondary | ICD-10-CM

## 2013-12-09 DIAGNOSIS — H547 Unspecified visual loss: Secondary | ICD-10-CM

## 2013-12-09 DIAGNOSIS — F32A Depression, unspecified: Secondary | ICD-10-CM

## 2013-12-09 DIAGNOSIS — G479 Sleep disorder, unspecified: Secondary | ICD-10-CM

## 2013-12-09 DIAGNOSIS — R51 Headache: Secondary | ICD-10-CM

## 2013-12-09 DIAGNOSIS — I2510 Atherosclerotic heart disease of native coronary artery without angina pectoris: Secondary | ICD-10-CM | POA: Diagnosis not present

## 2013-12-09 DIAGNOSIS — I509 Heart failure, unspecified: Secondary | ICD-10-CM | POA: Diagnosis not present

## 2013-12-09 LAB — POCT URINALYSIS DIPSTICK
Bilirubin, UA: NEGATIVE
Blood, UA: NEGATIVE
Glucose, UA: NEGATIVE
Ketones, UA: NEGATIVE
Nitrite, UA: NEGATIVE
Protein, UA: NEGATIVE
Spec Grav, UA: 1.025
Urobilinogen, UA: 0.2
pH, UA: 5

## 2013-12-09 LAB — BASIC METABOLIC PANEL
BUN: 14 mg/dL (ref 6–23)
CO2: 25 mEq/L (ref 19–32)
Calcium: 9.4 mg/dL (ref 8.4–10.5)
Chloride: 104 mEq/L (ref 96–112)
Creatinine, Ser: 0.8 mg/dL (ref 0.4–1.2)
GFR: 68.37 mL/min (ref >60.00)
Glucose, Bld: 86 mg/dL (ref 70–99)
Potassium: 4.2 mEq/L (ref 3.5–5.1)
Sodium: 139 mEq/L (ref 135–145)

## 2013-12-09 LAB — LIPID PANEL
CHOL/HDL RATIO: 3
Cholesterol: 187 mg/dL (ref 0–200)
HDL: 68.7 mg/dL (ref >39.00)
LDL CALC: 102 mg/dL — AB (ref 0–99)
Triglycerides: 84 mg/dL (ref 0.0–149.0)
VLDL: 16.8 mg/dL (ref 0.0–40.0)

## 2013-12-09 MED ORDER — CIPROFLOXACIN HCL 250 MG PO TABS: 250.0000 mg | ORAL_TABLET | Freq: Two times a day (BID) | ORAL | Status: DC

## 2013-12-09 NOTE — Progress Notes (Signed)
Pre-visit discussion using our clinic review tool. No additional management support is needed unless otherwise documented below in the visit note.  

## 2013-12-10 ENCOUNTER — Ambulatory Visit: Payer: Medicare Other | Admitting: Internal Medicine

## 2013-12-10 ENCOUNTER — Telehealth: Payer: Self-pay | Admitting: Internal Medicine

## 2013-12-10 DIAGNOSIS — I2510 Atherosclerotic heart disease of native coronary artery without angina pectoris: Secondary | ICD-10-CM | POA: Diagnosis not present

## 2013-12-10 DIAGNOSIS — F411 Generalized anxiety disorder: Secondary | ICD-10-CM | POA: Diagnosis not present

## 2013-12-10 DIAGNOSIS — I509 Heart failure, unspecified: Secondary | ICD-10-CM | POA: Diagnosis not present

## 2013-12-10 DIAGNOSIS — H3530 Unspecified macular degeneration: Secondary | ICD-10-CM | POA: Diagnosis not present

## 2013-12-10 DIAGNOSIS — I10 Essential (primary) hypertension: Secondary | ICD-10-CM | POA: Diagnosis not present

## 2013-12-10 DIAGNOSIS — I2789 Other specified pulmonary heart diseases: Secondary | ICD-10-CM | POA: Diagnosis not present

## 2013-12-10 LAB — URINE CULTURE
Colony Count: NO GROWTH
Organism ID, Bacteria: NO GROWTH

## 2013-12-10 NOTE — Telephone Encounter (Signed)
Rowe Clack [facility] called about [name] shoulder pain.  She wanted to extended [name] care.  She also wanted to know if Dr Nicki Reaper would call in a RX for valteran gel for [name] shoulder pain. You can reach her @ [phone]

## 2013-12-10 NOTE — Telephone Encounter (Signed)
Ok to extend care for the shoulder pain.  PT was seeing her for strengthening and gait.  I would also like for them to also evaluate her for her shoulder and neck pain.  Hold on voltaren gel for now.

## 2013-12-10 NOTE — Telephone Encounter (Signed)
[name] notified (verbal given) & she stated the PT is completed but she can take over with her shoulder issues.

## 2013-12-12 ENCOUNTER — Encounter: Payer: Self-pay | Admitting: Internal Medicine

## 2013-12-12 DIAGNOSIS — N649 Disorder of breast, unspecified: Secondary | ICD-10-CM | POA: Insufficient documentation

## 2013-12-12 DIAGNOSIS — L988 Other specified disorders of the skin and subcutaneous tissue: Secondary | ICD-10-CM | POA: Insufficient documentation

## 2013-12-12 DIAGNOSIS — M542 Cervicalgia: Secondary | ICD-10-CM | POA: Insufficient documentation

## 2013-12-12 NOTE — Assessment & Plan Note (Signed)
Seeing Dr Nicolasa Ducking.  On zoloft and trazodone.  Follow.

## 2013-12-12 NOTE — Assessment & Plan Note (Signed)
Persistent intermittent headache.   Has previously had MRI.  Unrevealing.  Has seen neurology, ENT and opthalmology.  No clear etiology found.  Some form of headache present now for one year.  Previously tried on prednisone for treatment of possible temporal arteritis.  Biopsy negative.  No change previously on high dose prednisone.  She feels headache unchanged since the fall.  Declines CT.

## 2013-12-12 NOTE — Assessment & Plan Note (Signed)
On trazodone and zoloft.   Follow.

## 2013-12-12 NOTE — Assessment & Plan Note (Signed)
Has seen Dr  Fath and Dr Fleming.  Just had f/u ECHO in the hospital.  Follow.   

## 2013-12-12 NOTE — Assessment & Plan Note (Signed)
Low cholesterol diet.  Continue current medication regimen.  Follow lipid panel and liver function.   

## 2013-12-12 NOTE — Assessment & Plan Note (Signed)
Neck and shoulder pain as outlined.  Receiving physical therapy at home.  Have them work in her shoulders and neck.  Follow.

## 2013-12-12 NOTE — Assessment & Plan Note (Signed)
Persistent breast lesion.  Refer to dermatology for evaluation.

## 2013-12-12 NOTE — Assessment & Plan Note (Signed)
Occasional light headedness.  Has seen ENT and neuro.  Does not feel this has changed since the fall.  She declines CT scan (of head) (given the recent fall).  Eyes may be contributing.

## 2013-12-12 NOTE — Assessment & Plan Note (Signed)
Blood pressure doing well on no medication.  Follow.   

## 2013-12-12 NOTE — Assessment & Plan Note (Signed)
Feels she has a reoccurring infection now.  Check urinalysis.

## 2013-12-12 NOTE — Assessment & Plan Note (Signed)
Visual field defect - lower 1/2.  Worked up in the hospital.  On aspirin.  Has seen opthalmology.  Refer back to neurology to confirm no further w/up warranted.  Follow.

## 2013-12-12 NOTE — Assessment & Plan Note (Signed)
On zoloft and trazodone.  Seeing Dr Kapur.  Follow.    

## 2013-12-12 NOTE — Progress Notes (Signed)
Subjective:    Patient ID: [name], female    DOB: Aug 04, 1926, 80 y.o.   MRN: [id number]  Urinary Tract Infection   80 year old female with past history of uterine and vulvar cancer, hypertension and hypercholesterolemia who comes in today as a work in with concerns regarding increased urinary frequency and some shoulder discomfort.  She is accompanied by her husband.  History obtained from both of them.  Has been having issues with increased depression and increased stress.  Saw Dr Toy Care.  On zoloft and trazodone.  Doing better.  She is still having the pain behind the left eye.  Has had ENT evaluation and has seen two neurologists.  Has had MRI.  Unclear as to the exact etiology.  We had placed her on prednisone to see if this would improve her headaches.  While on the prednisone, she reported no difference in the headaches.  Temporal artery biopsy was negative.  Prednisone stopped.  She has recently been evaluated by cardiology (Dr Manuella Ghazi at [place] at [facility].  States had ECHO and holter monitor.  States everything has checked out fine.  She denies any chest pain or tightness.  Breathing stable. She was recently admitted with acute vision loss.  Still has a visual field defect.  Seeing opthalmology for this.  She started taking lipitor.  She is concerned she is having some side effects from the lipitor.  Some light headedness, but she describes this as being unchanged from her recent light headedness.  She did recently fall.  She just reached over and then fell forward.  States she did not hit her head.  No change in her headache.  Some increased urinary frequency.  Some dysuria.  Some neck and shoulder pain.  Hurts more when she turns her head.     Past Medical History  Diagnosis Date  . Cancer     uterine and questionable vulvar, s/p hysterectomy  . Hypercholesterolemia   . Chronic headaches   . GERD (gastroesophageal reflux disease)   . Glaucoma   . Arthritis   . Right bundle branch  block   . Reactive airway disease   . Cystocele   . Rectocele   . Hypertension   . Macular degeneration     Current Outpatient Prescriptions on File Prior to Visit  Medication Sig Dispense Refill  . acetaminophen (TYLENOL) 325 MG tablet Take 2 tablets (650 mg total) by mouth every 4 (four) hours as needed for mild pain, fever or headache.      [name] aspirin 325 MG tablet Take 1 tablet (325 mg total) by mouth daily.      [name] atorvastatin (LIPITOR) 40 MG tablet Take 1 tablet (40 mg total) by mouth daily at 6 PM.  30 tablet  5  . brinzolamide (AZOPT) 1 % ophthalmic suspension Place 1 drop into the left eye 3 (three) times daily.  10 mL  12  . furosemide (LASIX) 20 MG tablet Take 1 tablet (20 mg total) by mouth daily.  30 tablet    . latanoprost (XALATAN) 0.005 % ophthalmic solution Place 1 drop into both eyes at bedtime.  2.5 mL  12  . memantine (NAMENDA) 10 MG tablet Take 10 mg by mouth at bedtime.      . Multiple Vitamin (MULTIVITAMIN) tablet Take 1 tablet by mouth daily.      . Multiple Vitamins-Minerals (PRESERVISION AREDS) TABS Take 1 tablet by mouth every morning.      . Omega-3 Fatty Acids (  FISH OIL CONCENTRATE PO) Take 1 capsule by mouth 2 (two) times daily. 1750mg       . omeprazole (PRILOSEC) 20 MG capsule Take 20 mg by mouth every morning.      . sertraline (ZOLOFT) 100 MG tablet Take 100 mg by mouth daily.      . travoprost, benzalkonium, (TRAVATAN) 0.004 % ophthalmic solution Place 1 drop into both eyes at bedtime.      . traZODone (DESYREL) 50 MG tablet Take 50-100 mg by mouth every evening.      . vitamin D, CHOLECALCIFEROL, 400 UNITS tablet Take 400 Units by mouth daily.       No current facility-administered medications on file prior to visit.    Review of Systems Llght headedness as outlined.  No increased sinus congestion or drainage.  Using flonase.   No chest pain or tightness.  Breathing stable.  No acid reflux.  No nausea or vomiting.  No abdominal pain or cramping.  No  bowel change.  Increased urinary frequency.  Dysuria.  Recent fall.  Shoulder and neck soreness as outlined.  Has a persistent breast/chest lesion.  Still with the persistent visual field defect.          Objective:   Physical Exam  Filed Vitals:   12/09/13 1151  BP: 140/80  Pulse: 75  Temp: 98 F (13.56 C)   80 year old female in no acute distress.   HEENT:  Nares- clear.  Oropharynx - without lesions.  No significant tenderness to palpation over the sinus/temporal region. NECK:  Supple.  Nontender.  No audible bruit.  HEART:  Appears to be regular. LUNGS:  No crackles or wheezing audible.  Respirations even and unlabored.  RADIAL PULSE:  Equal bilaterally.   ABDOMEN:  Soft, nontender.  Bowel sounds present and normal.  No audible abdominal bruit.   EXTREMITIES:  No increased edema present.  DP pulses palpable and equal bilaterally.   SKIN:  Persistent chest/breast lesion.          Assessment & Plan:  GI.  Colonoscopy 03/05/05 with diverticulosis.  Currently doing well.   CARDIOVASCULAR.  Stress test 12/18/10 revealed no ischemia.  ECHO 12/18/10 revealed EF 60% with mild RV and RA enlargement, mild LVH, mild mitral and tricuspid insufficiency with documented mild pulmonary hypertension.  Saw Dr Ubaldo Glassing.  Felt no further w/up warranted.  Has recently been reevaluated by Dr Manuella Ghazi - cardiology with [place].  States everything has checked out fine.  Currently doing well.   KNEE PAIN.  Seeing Dr Latanya Maudlin.    HEALTH MAINTENANCE.  Up to date with her physicals.    I spent more than 40 minutes with the patient and more than 50% of the time was spent in consultation regarding the above.

## 2013-12-13 ENCOUNTER — Telehealth: Payer: Self-pay | Admitting: Internal Medicine

## 2013-12-13 ENCOUNTER — Telehealth: Payer: Self-pay | Admitting: *Deleted

## 2013-12-13 DIAGNOSIS — I2510 Atherosclerotic heart disease of native coronary artery without angina pectoris: Secondary | ICD-10-CM | POA: Diagnosis not present

## 2013-12-13 DIAGNOSIS — H4010X0 Unspecified open-angle glaucoma, stage unspecified: Secondary | ICD-10-CM | POA: Diagnosis not present

## 2013-12-13 DIAGNOSIS — I509 Heart failure, unspecified: Secondary | ICD-10-CM | POA: Diagnosis not present

## 2013-12-13 DIAGNOSIS — H3530 Unspecified macular degeneration: Secondary | ICD-10-CM | POA: Diagnosis not present

## 2013-12-13 DIAGNOSIS — I10 Essential (primary) hypertension: Secondary | ICD-10-CM | POA: Diagnosis not present

## 2013-12-13 DIAGNOSIS — I2789 Other specified pulmonary heart diseases: Secondary | ICD-10-CM | POA: Diagnosis not present

## 2013-12-13 DIAGNOSIS — F411 Generalized anxiety disorder: Secondary | ICD-10-CM | POA: Diagnosis not present

## 2013-12-13 NOTE — Telephone Encounter (Signed)
We worked her in at the end of last week.  I did her labs then.  Thanks.

## 2013-12-13 NOTE — Telephone Encounter (Signed)
Please reschedule neurology appt and also needs urology appt (Dr Jacqlyn Larsen).  Persistent urinary frequency.

## 2013-12-13 NOTE — Telephone Encounter (Signed)
See lab result (pt has decided to proceed with referral)

## 2013-12-13 NOTE — Telephone Encounter (Signed)
Pt is coming in for labs tomorrow what labs and dx?  

## 2013-12-13 NOTE — Telephone Encounter (Signed)
States pt needs appt with neurologist and urologist.

## 2013-12-14 ENCOUNTER — Other Ambulatory Visit: Payer: Medicare Other

## 2013-12-14 DIAGNOSIS — F411 Generalized anxiety disorder: Secondary | ICD-10-CM | POA: Diagnosis not present

## 2013-12-14 DIAGNOSIS — H3530 Unspecified macular degeneration: Secondary | ICD-10-CM | POA: Diagnosis not present

## 2013-12-14 DIAGNOSIS — I2789 Other specified pulmonary heart diseases: Secondary | ICD-10-CM | POA: Diagnosis not present

## 2013-12-14 DIAGNOSIS — I509 Heart failure, unspecified: Secondary | ICD-10-CM | POA: Diagnosis not present

## 2013-12-14 DIAGNOSIS — I10 Essential (primary) hypertension: Secondary | ICD-10-CM | POA: Diagnosis not present

## 2013-12-14 DIAGNOSIS — I2510 Atherosclerotic heart disease of native coronary artery without angina pectoris: Secondary | ICD-10-CM | POA: Diagnosis not present

## 2013-12-14 NOTE — Telephone Encounter (Signed)
Pt wanted to make me aware she did not want any Thursday

## 2013-12-14 NOTE — Telephone Encounter (Signed)
Pt to see Dr. Manuella Ghazi on 12/17/13 @ 130. Referral for Dr. Jacqlyn Larsen underway

## 2013-12-14 NOTE — Telephone Encounter (Signed)
Thanks

## 2013-12-15 DIAGNOSIS — F411 Generalized anxiety disorder: Secondary | ICD-10-CM | POA: Diagnosis not present

## 2013-12-15 DIAGNOSIS — I2510 Atherosclerotic heart disease of native coronary artery without angina pectoris: Secondary | ICD-10-CM | POA: Diagnosis not present

## 2013-12-15 DIAGNOSIS — H3530 Unspecified macular degeneration: Secondary | ICD-10-CM | POA: Diagnosis not present

## 2013-12-15 DIAGNOSIS — I509 Heart failure, unspecified: Secondary | ICD-10-CM | POA: Diagnosis not present

## 2013-12-15 DIAGNOSIS — I10 Essential (primary) hypertension: Secondary | ICD-10-CM | POA: Diagnosis not present

## 2013-12-15 DIAGNOSIS — I2789 Other specified pulmonary heart diseases: Secondary | ICD-10-CM | POA: Diagnosis not present

## 2013-12-16 ENCOUNTER — Telehealth: Payer: Self-pay | Admitting: Internal Medicine

## 2013-12-16 DIAGNOSIS — I2510 Atherosclerotic heart disease of native coronary artery without angina pectoris: Secondary | ICD-10-CM | POA: Diagnosis not present

## 2013-12-16 DIAGNOSIS — H3530 Unspecified macular degeneration: Secondary | ICD-10-CM | POA: Diagnosis not present

## 2013-12-16 DIAGNOSIS — I2789 Other specified pulmonary heart diseases: Secondary | ICD-10-CM | POA: Diagnosis not present

## 2013-12-16 DIAGNOSIS — I509 Heart failure, unspecified: Secondary | ICD-10-CM | POA: Diagnosis not present

## 2013-12-16 DIAGNOSIS — I10 Essential (primary) hypertension: Secondary | ICD-10-CM | POA: Diagnosis not present

## 2013-12-16 DIAGNOSIS — F411 Generalized anxiety disorder: Secondary | ICD-10-CM | POA: Diagnosis not present

## 2013-12-16 NOTE — Telephone Encounter (Signed)
Izora Gala called requesting VO to show pt how to use the TENS units that pt bought otc. Verbal order given to Izora Gala & she wanted to let you know that Ms. Mckinny has an appt with Antionette Char on Monday

## 2013-12-16 NOTE — Telephone Encounter (Signed)
Herbie Drape from [facility] left vm.  Calling to f/u home health certification and plan of care that was sent 3/2.  Asking if this has been completed and can be faxed or if she needs to send it again.  Asking for a call [phone] extension [phone].

## 2013-12-17 DIAGNOSIS — F411 Generalized anxiety disorder: Secondary | ICD-10-CM | POA: Diagnosis not present

## 2013-12-17 DIAGNOSIS — I509 Heart failure, unspecified: Secondary | ICD-10-CM | POA: Diagnosis not present

## 2013-12-17 DIAGNOSIS — I10 Essential (primary) hypertension: Secondary | ICD-10-CM | POA: Diagnosis not present

## 2013-12-17 DIAGNOSIS — I2789 Other specified pulmonary heart diseases: Secondary | ICD-10-CM | POA: Diagnosis not present

## 2013-12-17 DIAGNOSIS — H3530 Unspecified macular degeneration: Secondary | ICD-10-CM | POA: Diagnosis not present

## 2013-12-17 DIAGNOSIS — I2510 Atherosclerotic heart disease of native coronary artery without angina pectoris: Secondary | ICD-10-CM | POA: Diagnosis not present

## 2013-12-17 DIAGNOSIS — R51 Headache: Secondary | ICD-10-CM | POA: Diagnosis not present

## 2013-12-17 DIAGNOSIS — H349 Unspecified retinal vascular occlusion: Secondary | ICD-10-CM | POA: Diagnosis not present

## 2013-12-17 NOTE — Telephone Encounter (Signed)
Care plan signed.  Placed in your box.  Will need to also give a copy to [facility] to charge for plan of care.  Thanks.

## 2013-12-17 NOTE — Telephone Encounter (Signed)
Forms faxed & copy given to [facility]

## 2013-12-20 ENCOUNTER — Other Ambulatory Visit: Payer: Self-pay | Admitting: *Deleted

## 2013-12-20 DIAGNOSIS — M25519 Pain in unspecified shoulder: Secondary | ICD-10-CM | POA: Diagnosis not present

## 2013-12-20 MED ORDER — OMEPRAZOLE 20 MG PO CPDR: 20.0000 mg | DELAYED_RELEASE_CAPSULE | Freq: Every morning | ORAL | Status: DC

## 2013-12-21 DIAGNOSIS — I10 Essential (primary) hypertension: Secondary | ICD-10-CM | POA: Diagnosis not present

## 2013-12-21 DIAGNOSIS — I2789 Other specified pulmonary heart diseases: Secondary | ICD-10-CM | POA: Diagnosis not present

## 2013-12-21 DIAGNOSIS — I2510 Atherosclerotic heart disease of native coronary artery without angina pectoris: Secondary | ICD-10-CM | POA: Diagnosis not present

## 2013-12-21 DIAGNOSIS — F411 Generalized anxiety disorder: Secondary | ICD-10-CM | POA: Diagnosis not present

## 2013-12-21 DIAGNOSIS — I509 Heart failure, unspecified: Secondary | ICD-10-CM | POA: Diagnosis not present

## 2013-12-21 DIAGNOSIS — H3530 Unspecified macular degeneration: Secondary | ICD-10-CM | POA: Diagnosis not present

## 2013-12-22 DIAGNOSIS — H3530 Unspecified macular degeneration: Secondary | ICD-10-CM | POA: Diagnosis not present

## 2013-12-22 DIAGNOSIS — I509 Heart failure, unspecified: Secondary | ICD-10-CM | POA: Diagnosis not present

## 2013-12-22 DIAGNOSIS — I2510 Atherosclerotic heart disease of native coronary artery without angina pectoris: Secondary | ICD-10-CM | POA: Diagnosis not present

## 2013-12-22 DIAGNOSIS — F411 Generalized anxiety disorder: Secondary | ICD-10-CM | POA: Diagnosis not present

## 2013-12-22 DIAGNOSIS — I2789 Other specified pulmonary heart diseases: Secondary | ICD-10-CM | POA: Diagnosis not present

## 2013-12-22 DIAGNOSIS — I10 Essential (primary) hypertension: Secondary | ICD-10-CM | POA: Diagnosis not present

## 2013-12-24 DIAGNOSIS — N393 Stress incontinence (female) (male): Secondary | ICD-10-CM | POA: Diagnosis not present

## 2013-12-24 DIAGNOSIS — H3530 Unspecified macular degeneration: Secondary | ICD-10-CM | POA: Diagnosis not present

## 2013-12-24 DIAGNOSIS — I2789 Other specified pulmonary heart diseases: Secondary | ICD-10-CM | POA: Diagnosis not present

## 2013-12-24 DIAGNOSIS — N3020 Other chronic cystitis without hematuria: Secondary | ICD-10-CM | POA: Diagnosis not present

## 2013-12-24 DIAGNOSIS — F411 Generalized anxiety disorder: Secondary | ICD-10-CM | POA: Diagnosis not present

## 2013-12-24 DIAGNOSIS — N3941 Urge incontinence: Secondary | ICD-10-CM | POA: Diagnosis not present

## 2013-12-24 DIAGNOSIS — R339 Retention of urine, unspecified: Secondary | ICD-10-CM | POA: Diagnosis not present

## 2013-12-24 DIAGNOSIS — I509 Heart failure, unspecified: Secondary | ICD-10-CM | POA: Diagnosis not present

## 2013-12-24 DIAGNOSIS — I10 Essential (primary) hypertension: Secondary | ICD-10-CM | POA: Diagnosis not present

## 2013-12-24 DIAGNOSIS — R350 Frequency of micturition: Secondary | ICD-10-CM | POA: Insufficient documentation

## 2013-12-24 DIAGNOSIS — I2510 Atherosclerotic heart disease of native coronary artery without angina pectoris: Secondary | ICD-10-CM | POA: Diagnosis not present

## 2013-12-29 DIAGNOSIS — H3530 Unspecified macular degeneration: Secondary | ICD-10-CM | POA: Diagnosis not present

## 2013-12-29 DIAGNOSIS — I10 Essential (primary) hypertension: Secondary | ICD-10-CM | POA: Diagnosis not present

## 2013-12-29 DIAGNOSIS — I2510 Atherosclerotic heart disease of native coronary artery without angina pectoris: Secondary | ICD-10-CM | POA: Diagnosis not present

## 2013-12-29 DIAGNOSIS — I2789 Other specified pulmonary heart diseases: Secondary | ICD-10-CM | POA: Diagnosis not present

## 2013-12-29 DIAGNOSIS — F411 Generalized anxiety disorder: Secondary | ICD-10-CM | POA: Diagnosis not present

## 2013-12-29 DIAGNOSIS — I509 Heart failure, unspecified: Secondary | ICD-10-CM | POA: Diagnosis not present

## 2013-12-30 DIAGNOSIS — F411 Generalized anxiety disorder: Secondary | ICD-10-CM | POA: Diagnosis not present

## 2013-12-30 DIAGNOSIS — I509 Heart failure, unspecified: Secondary | ICD-10-CM | POA: Diagnosis not present

## 2013-12-30 DIAGNOSIS — I2789 Other specified pulmonary heart diseases: Secondary | ICD-10-CM | POA: Diagnosis not present

## 2013-12-30 DIAGNOSIS — I2510 Atherosclerotic heart disease of native coronary artery without angina pectoris: Secondary | ICD-10-CM | POA: Diagnosis not present

## 2013-12-30 DIAGNOSIS — H3530 Unspecified macular degeneration: Secondary | ICD-10-CM | POA: Diagnosis not present

## 2013-12-30 DIAGNOSIS — I10 Essential (primary) hypertension: Secondary | ICD-10-CM | POA: Diagnosis not present

## 2013-12-31 DIAGNOSIS — I10 Essential (primary) hypertension: Secondary | ICD-10-CM | POA: Diagnosis not present

## 2013-12-31 DIAGNOSIS — I2510 Atherosclerotic heart disease of native coronary artery without angina pectoris: Secondary | ICD-10-CM | POA: Diagnosis not present

## 2013-12-31 DIAGNOSIS — H3530 Unspecified macular degeneration: Secondary | ICD-10-CM | POA: Diagnosis not present

## 2013-12-31 DIAGNOSIS — I2789 Other specified pulmonary heart diseases: Secondary | ICD-10-CM | POA: Diagnosis not present

## 2013-12-31 DIAGNOSIS — F411 Generalized anxiety disorder: Secondary | ICD-10-CM | POA: Diagnosis not present

## 2013-12-31 DIAGNOSIS — I509 Heart failure, unspecified: Secondary | ICD-10-CM | POA: Diagnosis not present

## 2014-01-02 DIAGNOSIS — F411 Generalized anxiety disorder: Secondary | ICD-10-CM | POA: Diagnosis not present

## 2014-01-02 DIAGNOSIS — I10 Essential (primary) hypertension: Secondary | ICD-10-CM | POA: Diagnosis not present

## 2014-01-02 DIAGNOSIS — H3530 Unspecified macular degeneration: Secondary | ICD-10-CM | POA: Diagnosis not present

## 2014-01-02 DIAGNOSIS — I509 Heart failure, unspecified: Secondary | ICD-10-CM | POA: Diagnosis not present

## 2014-01-02 DIAGNOSIS — E785 Hyperlipidemia, unspecified: Secondary | ICD-10-CM | POA: Diagnosis not present

## 2014-01-02 DIAGNOSIS — I2510 Atherosclerotic heart disease of native coronary artery without angina pectoris: Secondary | ICD-10-CM | POA: Diagnosis not present

## 2014-01-02 DIAGNOSIS — I2789 Other specified pulmonary heart diseases: Secondary | ICD-10-CM | POA: Diagnosis not present

## 2014-01-05 DIAGNOSIS — I509 Heart failure, unspecified: Secondary | ICD-10-CM | POA: Diagnosis not present

## 2014-01-05 DIAGNOSIS — I10 Essential (primary) hypertension: Secondary | ICD-10-CM | POA: Diagnosis not present

## 2014-01-05 DIAGNOSIS — H3530 Unspecified macular degeneration: Secondary | ICD-10-CM | POA: Diagnosis not present

## 2014-01-05 DIAGNOSIS — I2789 Other specified pulmonary heart diseases: Secondary | ICD-10-CM | POA: Diagnosis not present

## 2014-01-05 DIAGNOSIS — I2510 Atherosclerotic heart disease of native coronary artery without angina pectoris: Secondary | ICD-10-CM | POA: Diagnosis not present

## 2014-01-05 DIAGNOSIS — F411 Generalized anxiety disorder: Secondary | ICD-10-CM | POA: Diagnosis not present

## 2014-01-06 DIAGNOSIS — L905 Scar conditions and fibrosis of skin: Secondary | ICD-10-CM | POA: Diagnosis not present

## 2014-01-06 DIAGNOSIS — I509 Heart failure, unspecified: Secondary | ICD-10-CM | POA: Diagnosis not present

## 2014-01-06 DIAGNOSIS — I2510 Atherosclerotic heart disease of native coronary artery without angina pectoris: Secondary | ICD-10-CM | POA: Diagnosis not present

## 2014-01-06 DIAGNOSIS — I2789 Other specified pulmonary heart diseases: Secondary | ICD-10-CM | POA: Diagnosis not present

## 2014-01-06 DIAGNOSIS — I10 Essential (primary) hypertension: Secondary | ICD-10-CM | POA: Diagnosis not present

## 2014-01-06 DIAGNOSIS — H3530 Unspecified macular degeneration: Secondary | ICD-10-CM | POA: Diagnosis not present

## 2014-01-06 DIAGNOSIS — F411 Generalized anxiety disorder: Secondary | ICD-10-CM | POA: Diagnosis not present

## 2014-01-10 DIAGNOSIS — I509 Heart failure, unspecified: Secondary | ICD-10-CM | POA: Diagnosis not present

## 2014-01-10 DIAGNOSIS — I10 Essential (primary) hypertension: Secondary | ICD-10-CM | POA: Diagnosis not present

## 2014-01-10 DIAGNOSIS — I2789 Other specified pulmonary heart diseases: Secondary | ICD-10-CM | POA: Diagnosis not present

## 2014-01-10 DIAGNOSIS — I2510 Atherosclerotic heart disease of native coronary artery without angina pectoris: Secondary | ICD-10-CM | POA: Diagnosis not present

## 2014-01-10 DIAGNOSIS — H3530 Unspecified macular degeneration: Secondary | ICD-10-CM | POA: Diagnosis not present

## 2014-01-10 DIAGNOSIS — F411 Generalized anxiety disorder: Secondary | ICD-10-CM | POA: Diagnosis not present

## 2014-01-12 DIAGNOSIS — I2510 Atherosclerotic heart disease of native coronary artery without angina pectoris: Secondary | ICD-10-CM | POA: Diagnosis not present

## 2014-01-12 DIAGNOSIS — F411 Generalized anxiety disorder: Secondary | ICD-10-CM | POA: Diagnosis not present

## 2014-01-12 DIAGNOSIS — I2789 Other specified pulmonary heart diseases: Secondary | ICD-10-CM | POA: Diagnosis not present

## 2014-01-12 DIAGNOSIS — I509 Heart failure, unspecified: Secondary | ICD-10-CM | POA: Diagnosis not present

## 2014-01-12 DIAGNOSIS — H3530 Unspecified macular degeneration: Secondary | ICD-10-CM | POA: Diagnosis not present

## 2014-01-12 DIAGNOSIS — I10 Essential (primary) hypertension: Secondary | ICD-10-CM | POA: Diagnosis not present

## 2014-01-14 DIAGNOSIS — I2510 Atherosclerotic heart disease of native coronary artery without angina pectoris: Secondary | ICD-10-CM | POA: Diagnosis not present

## 2014-01-14 DIAGNOSIS — H3530 Unspecified macular degeneration: Secondary | ICD-10-CM | POA: Diagnosis not present

## 2014-01-14 DIAGNOSIS — F411 Generalized anxiety disorder: Secondary | ICD-10-CM | POA: Diagnosis not present

## 2014-01-14 DIAGNOSIS — I10 Essential (primary) hypertension: Secondary | ICD-10-CM | POA: Diagnosis not present

## 2014-01-14 DIAGNOSIS — I2789 Other specified pulmonary heart diseases: Secondary | ICD-10-CM | POA: Diagnosis not present

## 2014-01-14 DIAGNOSIS — I509 Heart failure, unspecified: Secondary | ICD-10-CM | POA: Diagnosis not present

## 2014-01-14 NOTE — Telephone Encounter (Signed)
Need to know where pt is having pain.  With her other issues, I would prefer her to use Tylenol.  If increased pain and persistent problems - will need evaluation.

## 2014-01-14 NOTE — Telephone Encounter (Signed)
Spoke with Home health nurse and notified her of Dr. Bary Leriche comments. She verbalized understanding. Nurse stated that [name] c/o shoulder pain and tight muscles near the shoulder. Nurse also stated that she will advise [name] not to use the aleve or ibuprofen and to continue taking tylenol. Nurse stated that she will notify Dr. Nicki Reaper if pain increases.

## 2014-01-14 NOTE — Telephone Encounter (Signed)
Home Health nurse called and left VM stating that pt has Aleve and ibuprofen at home and wanted to know if it was ok for pt to take 1 or the other. Nurse told pt she can not take both at the same time. Pt is having constant pain at a level of 3-4 out of 10. Please advise.

## 2014-01-15 NOTE — Telephone Encounter (Signed)
Noted  

## 2014-01-17 DIAGNOSIS — H3530 Unspecified macular degeneration: Secondary | ICD-10-CM | POA: Diagnosis not present

## 2014-01-17 DIAGNOSIS — I2789 Other specified pulmonary heart diseases: Secondary | ICD-10-CM | POA: Diagnosis not present

## 2014-01-17 DIAGNOSIS — I2510 Atherosclerotic heart disease of native coronary artery without angina pectoris: Secondary | ICD-10-CM | POA: Diagnosis not present

## 2014-01-17 DIAGNOSIS — F411 Generalized anxiety disorder: Secondary | ICD-10-CM | POA: Diagnosis not present

## 2014-01-17 DIAGNOSIS — I10 Essential (primary) hypertension: Secondary | ICD-10-CM | POA: Diagnosis not present

## 2014-01-17 DIAGNOSIS — I509 Heart failure, unspecified: Secondary | ICD-10-CM | POA: Diagnosis not present

## 2014-01-19 DIAGNOSIS — I10 Essential (primary) hypertension: Secondary | ICD-10-CM | POA: Diagnosis not present

## 2014-01-19 DIAGNOSIS — H3530 Unspecified macular degeneration: Secondary | ICD-10-CM | POA: Diagnosis not present

## 2014-01-19 DIAGNOSIS — I509 Heart failure, unspecified: Secondary | ICD-10-CM | POA: Diagnosis not present

## 2014-01-19 DIAGNOSIS — I2789 Other specified pulmonary heart diseases: Secondary | ICD-10-CM | POA: Diagnosis not present

## 2014-01-19 DIAGNOSIS — F411 Generalized anxiety disorder: Secondary | ICD-10-CM | POA: Diagnosis not present

## 2014-01-19 DIAGNOSIS — I2510 Atherosclerotic heart disease of native coronary artery without angina pectoris: Secondary | ICD-10-CM | POA: Diagnosis not present

## 2014-01-21 DIAGNOSIS — I2789 Other specified pulmonary heart diseases: Secondary | ICD-10-CM | POA: Diagnosis not present

## 2014-01-21 DIAGNOSIS — I509 Heart failure, unspecified: Secondary | ICD-10-CM | POA: Diagnosis not present

## 2014-01-21 DIAGNOSIS — I2510 Atherosclerotic heart disease of native coronary artery without angina pectoris: Secondary | ICD-10-CM | POA: Diagnosis not present

## 2014-01-21 DIAGNOSIS — F411 Generalized anxiety disorder: Secondary | ICD-10-CM | POA: Diagnosis not present

## 2014-01-21 DIAGNOSIS — H3530 Unspecified macular degeneration: Secondary | ICD-10-CM | POA: Diagnosis not present

## 2014-01-21 DIAGNOSIS — I10 Essential (primary) hypertension: Secondary | ICD-10-CM | POA: Diagnosis not present

## 2014-01-24 DIAGNOSIS — I2510 Atherosclerotic heart disease of native coronary artery without angina pectoris: Secondary | ICD-10-CM | POA: Diagnosis not present

## 2014-01-24 DIAGNOSIS — I2789 Other specified pulmonary heart diseases: Secondary | ICD-10-CM | POA: Diagnosis not present

## 2014-01-24 DIAGNOSIS — F411 Generalized anxiety disorder: Secondary | ICD-10-CM | POA: Diagnosis not present

## 2014-01-24 DIAGNOSIS — I10 Essential (primary) hypertension: Secondary | ICD-10-CM | POA: Diagnosis not present

## 2014-01-24 DIAGNOSIS — I509 Heart failure, unspecified: Secondary | ICD-10-CM | POA: Diagnosis not present

## 2014-01-24 DIAGNOSIS — H3530 Unspecified macular degeneration: Secondary | ICD-10-CM | POA: Diagnosis not present

## 2014-01-26 DIAGNOSIS — I2789 Other specified pulmonary heart diseases: Secondary | ICD-10-CM | POA: Diagnosis not present

## 2014-01-26 DIAGNOSIS — H3530 Unspecified macular degeneration: Secondary | ICD-10-CM | POA: Diagnosis not present

## 2014-01-26 DIAGNOSIS — I2510 Atherosclerotic heart disease of native coronary artery without angina pectoris: Secondary | ICD-10-CM | POA: Diagnosis not present

## 2014-01-26 DIAGNOSIS — F411 Generalized anxiety disorder: Secondary | ICD-10-CM | POA: Diagnosis not present

## 2014-01-26 DIAGNOSIS — I509 Heart failure, unspecified: Secondary | ICD-10-CM | POA: Diagnosis not present

## 2014-01-26 DIAGNOSIS — I10 Essential (primary) hypertension: Secondary | ICD-10-CM | POA: Diagnosis not present

## 2014-01-28 ENCOUNTER — Encounter: Payer: Self-pay | Admitting: Internal Medicine

## 2014-01-28 ENCOUNTER — Ambulatory Visit (INDEPENDENT_AMBULATORY_CARE_PROVIDER_SITE_OTHER): Payer: Medicare Other | Admitting: Internal Medicine

## 2014-01-28 ENCOUNTER — Ambulatory Visit (INDEPENDENT_AMBULATORY_CARE_PROVIDER_SITE_OTHER)
Admission: RE | Admit: 2014-01-28 | Discharge: 2014-01-28 | Disposition: A | Discharge status: Home / Self Care | Payer: Medicare Other | Source: Ambulatory Visit | Attending: Internal Medicine | Admitting: Internal Medicine

## 2014-01-28 VITALS — BP 120/60 | HR 60 | Temp 98.1°F | Ht 62.0 in | Wt 155.5 lb

## 2014-01-28 DIAGNOSIS — E7800 Pure hypercholesterolemia, unspecified: Secondary | ICD-10-CM

## 2014-01-28 DIAGNOSIS — M549 Dorsalgia, unspecified: Secondary | ICD-10-CM | POA: Diagnosis not present

## 2014-01-28 DIAGNOSIS — M47812 Spondylosis without myelopathy or radiculopathy, cervical region: Secondary | ICD-10-CM | POA: Diagnosis not present

## 2014-01-28 DIAGNOSIS — F3289 Other specified depressive episodes: Secondary | ICD-10-CM

## 2014-01-28 DIAGNOSIS — M47817 Spondylosis without myelopathy or radiculopathy, lumbosacral region: Secondary | ICD-10-CM | POA: Diagnosis not present

## 2014-01-28 DIAGNOSIS — Z87440 Personal history of urinary (tract) infections: Secondary | ICD-10-CM

## 2014-01-28 DIAGNOSIS — F419 Anxiety disorder, unspecified: Secondary | ICD-10-CM

## 2014-01-28 DIAGNOSIS — I2789 Other specified pulmonary heart diseases: Secondary | ICD-10-CM | POA: Diagnosis not present

## 2014-01-28 DIAGNOSIS — F411 Generalized anxiety disorder: Secondary | ICD-10-CM | POA: Diagnosis not present

## 2014-01-28 DIAGNOSIS — I10 Essential (primary) hypertension: Secondary | ICD-10-CM

## 2014-01-28 DIAGNOSIS — I272 Pulmonary hypertension, unspecified: Secondary | ICD-10-CM

## 2014-01-28 DIAGNOSIS — R42 Dizziness and giddiness: Secondary | ICD-10-CM

## 2014-01-28 DIAGNOSIS — R748 Abnormal levels of other serum enzymes: Secondary | ICD-10-CM | POA: Diagnosis not present

## 2014-01-28 DIAGNOSIS — G479 Sleep disorder, unspecified: Secondary | ICD-10-CM

## 2014-01-28 DIAGNOSIS — F329 Major depressive disorder, single episode, unspecified: Secondary | ICD-10-CM

## 2014-01-28 DIAGNOSIS — I509 Heart failure, unspecified: Secondary | ICD-10-CM | POA: Diagnosis not present

## 2014-01-28 DIAGNOSIS — H3530 Unspecified macular degeneration: Secondary | ICD-10-CM | POA: Diagnosis not present

## 2014-01-28 DIAGNOSIS — M5030 Other cervical disc degeneration, unspecified cervical region: Secondary | ICD-10-CM | POA: Diagnosis not present

## 2014-01-28 DIAGNOSIS — M2550 Pain in unspecified joint: Secondary | ICD-10-CM | POA: Diagnosis not present

## 2014-01-28 DIAGNOSIS — M542 Cervicalgia: Secondary | ICD-10-CM | POA: Diagnosis not present

## 2014-01-28 DIAGNOSIS — M4310 Spondylolisthesis, site unspecified: Secondary | ICD-10-CM | POA: Diagnosis not present

## 2014-01-28 DIAGNOSIS — F32A Depression, unspecified: Secondary | ICD-10-CM

## 2014-01-28 DIAGNOSIS — H547 Unspecified visual loss: Secondary | ICD-10-CM

## 2014-01-28 DIAGNOSIS — I2510 Atherosclerotic heart disease of native coronary artery without angina pectoris: Secondary | ICD-10-CM | POA: Diagnosis not present

## 2014-01-28 DIAGNOSIS — R51 Headache: Secondary | ICD-10-CM

## 2014-01-28 LAB — ALKALINE PHOSPHATASE: ALK PHOS: 112 U/L (ref 39–117)

## 2014-01-28 LAB — SEDIMENTATION RATE: SED RATE: 29 mm/h — AB (ref 0–22)

## 2014-01-28 LAB — C-REACTIVE PROTEIN: CRP: <0.5 mg/dL (ref 0.5–20.0)

## 2014-01-28 NOTE — Progress Notes (Signed)
Subjective:    Patient ID: [name], female    DOB: [birth year], 80 y.o.   MRN: [id number]  HPI 80 year old female with past history of uterine and vulvar cancer, hypertension and hypercholesterolemia who comes in today to follow up on these issues as well as for a complete physical exam.  She is accompanied by her daughter.  History obtained from both of them.  Has been having issues with increased depression and increased stress.  Saw Dr Toy Care.  On zoloft and trazodone.  Doing better.  The previous light headedness and dizziness have resolved.  She is still having the pain behind the left eye.  Has had ENT evaluation and has seen two neurologists.  Has had MRI.  Unclear as to the exact etiology.  We had placed her on prednisone to see if this would improve her headaches.  While on the prednisone, she reported no difference in the headaches.  Temporal artery biopsy was negative.  Prednisone stopped.   Has declined referral back to neurology.  She has recently been evaluated by cardiology (Dr Manuella Ghazi at [place] [facility]).  States had ECHO and holter monitor.  States everything has checked out fine.  She denies any chest pain or tightness.  Breathing stable.  She was recently admitted with acute vision loss.  Still with visual field defect.  Limits her mobility.  Using a walker around her house.  She complains of increased joint pain and stiffness.  On further questioning, she states she is limited with activity secondary to pain.  Mostly involves her neck and shoulders and her lower back.  No significant pain radiating down her leg.   No numbness or tingling described.  Has been on various pain medications previously.  Did not seem to make a big difference.  Some intolerance to pain medications.     Past Medical History  Diagnosis Date  . Cancer     uterine and questionable vulvar, s/p hysterectomy  . Hypercholesterolemia   . Chronic headaches   . GERD (gastroesophageal reflux disease)   .  Glaucoma   . Arthritis   . Right bundle branch block   . Reactive airway disease   . Cystocele   . Rectocele   . Hypertension   . Macular degeneration     Current Outpatient Prescriptions on File Prior to Visit  Medication Sig Dispense Refill  . aspirin 325 MG tablet Take 1 tablet (325 mg total) by mouth daily.      [name] atorvastatin (LIPITOR) 40 MG tablet Take 1 tablet (40 mg total) by mouth daily at 6 PM.  30 tablet  5  . brinzolamide (AZOPT) 1 % ophthalmic suspension Place 1 drop into the left eye 3 (three) times daily.  10 mL  12  . latanoprost (XALATAN) 0.005 % ophthalmic solution Place 1 drop into both eyes at bedtime.  2.5 mL  12  . memantine (NAMENDA) 10 MG tablet Take 10 mg by mouth at bedtime.      . Multiple Vitamin (MULTIVITAMIN) tablet Take 1 tablet by mouth daily.      . Multiple Vitamins-Minerals (PRESERVISION AREDS) TABS Take 1 tablet by mouth every morning.      . Omega-3 Fatty Acids (FISH OIL CONCENTRATE PO) Take 1 capsule by mouth 2 (two) times daily. 1750mg       . omeprazole (PRILOSEC) 20 MG capsule Take 1 capsule (20 mg total) by mouth every morning.  30 capsule  5  . sertraline (ZOLOFT) 100  MG tablet Take 100 mg by mouth daily.      . travoprost, benzalkonium, (TRAVATAN) 0.004 % ophthalmic solution Place 1 drop into both eyes at bedtime.      . traZODone (DESYREL) 50 MG tablet Take 50-100 mg by mouth every evening.      . vitamin D, CHOLECALCIFEROL, 400 UNITS tablet Take 400 Units by mouth daily.       No current facility-administered medications on file prior to visit.    Review of Systems No light headedness or dizziness.  Head pain as outlined.  Has had extensive w/up.   No increased sinus congestion or drainage.  No chest pain or tightness.  Breathing stable.  No acid reflux.  No nausea or vomiting.  No abdominal pain or cramping.  No bowel change.  Appears to be doing better with depression and anxiety.  Seeing psychiatry.  Increased neck/shoulder pain as  outlined.  Low back pain as outlined.        Objective:   Physical Exam  Filed Vitals:   01/28/14 0859  BP: 120/60  Pulse: 60  Temp: 98.1 F (36.7 C)   Blood pressure recheck:  28/13  80 year old female in no acute distress.   HEENT:  Nares- clear.  Oropharynx - without lesions. NECK:  Supple.  Nontender.  No audible bruit.  HEART:  Appears to be regular. LUNGS:  No crackles or wheezing audible.  Respirations even and unlabored.  RADIAL PULSE:  Equal bilaterally.    BREASTS:  No nipple discharge or nipple retraction present.  Could not appreciate any distinct nodules or axillary adenopathy.  ABDOMEN:  Soft, nontender.  Bowel sounds present and normal.  No audible abdominal bruit.  GU:  Not performed.    EXTREMITIES:  No increased edema present.  DP pulses palpable and equal bilaterally.          Assessment & Plan:  GI.  Colonoscopy 03/05/05 with diverticulosis.  Currently doing well.   CARDIOVASCULAR.  Stress test 12/18/10 revealed no ischemia.  ECHO 12/18/10 revealed EF 60% with mild RV and RA enlargement, mild LVH, mild mitral and tricuspid insufficiency with documented mild pulmonary hypertension.  Saw Dr Ubaldo Glassing.  Felt no further w/up warranted.  Has recently been reevaluated by Dr Manuella Ghazi - cardiology with [place].  States everything has checked out fine.  Currently doing well.   KNEE PAIN.  Has seen Dr Latanya Maudlin.    HEALTH MAINTENANCE.  Physical today.   Colonoscopy as outlined.  Mammogram 12/08/13 - birads I.

## 2014-01-28 NOTE — Progress Notes (Signed)
Pre visit review using our clinic review tool, if applicable. No additional management support is needed unless otherwise documented below in the visit note. 

## 2014-01-29 LAB — RHEUMATOID FACTOR: Rhuematoid fact SerPl-aCnc: <10 IU/mL (ref <=14)

## 2014-01-30 ENCOUNTER — Encounter: Payer: Self-pay | Admitting: Internal Medicine

## 2014-01-30 DIAGNOSIS — R748 Abnormal levels of other serum enzymes: Secondary | ICD-10-CM | POA: Insufficient documentation

## 2014-01-30 NOTE — Assessment & Plan Note (Signed)
On zoloft and trazodone.  Seeing Dr Kapur.  Follow.    

## 2014-01-30 NOTE — Assessment & Plan Note (Signed)
Sees Dr Jacqlyn Larsen.

## 2014-01-30 NOTE — Assessment & Plan Note (Signed)
Low cholesterol diet.  Continue current medication regimen.  Follow lipid panel and liver function.   

## 2014-01-30 NOTE — Assessment & Plan Note (Signed)
Has seen Dr  Ubaldo Glassing and Dr Raul Del.  Just had f/u ECHO in the hospital.  Follow.

## 2014-01-30 NOTE — Assessment & Plan Note (Signed)
Previous elevated alkaline phosphatase.  Recheck today.    

## 2014-01-30 NOTE — Assessment & Plan Note (Signed)
On trazodone and zoloft.   Follow.

## 2014-01-30 NOTE — Assessment & Plan Note (Addendum)
Persistent intermittent headache.   Has previously had MRI.  Unrevealing.  Has seen neurology, ENT and opthalmology.  No clear etiology found.  Some form of headache present now for one year.  Previously tried on prednisone for treatment of possible temporal arteritis.  Biopsy negative.  No change previously on high dose prednisone.  She feels headache unchanged.  Desires no further w/up.

## 2014-01-30 NOTE — Assessment & Plan Note (Signed)
Persistent back pain.  Limiting her activity.  Check L-S spine xray.  Further w/up pending results.  Discussed avoidance of narcotic medications

## 2014-01-30 NOTE — Assessment & Plan Note (Signed)
Blood pressure doing relatively well on no medication.  Follow.   

## 2014-01-30 NOTE — Assessment & Plan Note (Signed)
Occasional light headedness.  Has seen ENT and neuro.  Stable.

## 2014-01-30 NOTE — Assessment & Plan Note (Signed)
Visual field defect - lower 1/2.  Worked up in the hospital.  On aspirin.  Has seen opthalmology.    

## 2014-01-30 NOTE — Assessment & Plan Note (Signed)
Neck and shoulder pain as outlined.  Receiving physical therapy at home.  Persistent.  Check C-spine.  Follow.

## 2014-01-30 NOTE — Assessment & Plan Note (Signed)
Increased joint pain.  Check esr, RF, ana and CRP.

## 2014-01-30 NOTE — Assessment & Plan Note (Signed)
Seeing Dr Nicolasa Ducking.  On zoloft and trazodone.  Follow.

## 2014-01-31 ENCOUNTER — Telehealth: Payer: Self-pay | Admitting: Internal Medicine

## 2014-01-31 DIAGNOSIS — I509 Heart failure, unspecified: Secondary | ICD-10-CM | POA: Diagnosis not present

## 2014-01-31 DIAGNOSIS — F411 Generalized anxiety disorder: Secondary | ICD-10-CM | POA: Diagnosis not present

## 2014-01-31 DIAGNOSIS — I2510 Atherosclerotic heart disease of native coronary artery without angina pectoris: Secondary | ICD-10-CM | POA: Diagnosis not present

## 2014-01-31 DIAGNOSIS — M542 Cervicalgia: Secondary | ICD-10-CM

## 2014-01-31 DIAGNOSIS — M549 Dorsalgia, unspecified: Secondary | ICD-10-CM

## 2014-01-31 DIAGNOSIS — H3530 Unspecified macular degeneration: Secondary | ICD-10-CM | POA: Diagnosis not present

## 2014-01-31 DIAGNOSIS — I2789 Other specified pulmonary heart diseases: Secondary | ICD-10-CM | POA: Diagnosis not present

## 2014-01-31 DIAGNOSIS — I10 Essential (primary) hypertension: Secondary | ICD-10-CM | POA: Diagnosis not present

## 2014-01-31 LAB — ANA: Anti Nuclear Antibody(ANA): NEGATIVE

## 2014-01-31 NOTE — Telephone Encounter (Signed)
Order placed for referral to ortho

## 2014-02-01 ENCOUNTER — Encounter: Payer: Self-pay | Admitting: *Deleted

## 2014-02-02 ENCOUNTER — Telehealth: Payer: Self-pay | Admitting: Internal Medicine

## 2014-02-02 DIAGNOSIS — I2510 Atherosclerotic heart disease of native coronary artery without angina pectoris: Secondary | ICD-10-CM | POA: Diagnosis not present

## 2014-02-02 DIAGNOSIS — H3530 Unspecified macular degeneration: Secondary | ICD-10-CM | POA: Diagnosis not present

## 2014-02-02 DIAGNOSIS — I2789 Other specified pulmonary heart diseases: Secondary | ICD-10-CM | POA: Diagnosis not present

## 2014-02-02 DIAGNOSIS — I509 Heart failure, unspecified: Secondary | ICD-10-CM | POA: Diagnosis not present

## 2014-02-02 DIAGNOSIS — I10 Essential (primary) hypertension: Secondary | ICD-10-CM | POA: Diagnosis not present

## 2014-02-02 DIAGNOSIS — F411 Generalized anxiety disorder: Secondary | ICD-10-CM | POA: Diagnosis not present

## 2014-02-02 NOTE — Telephone Encounter (Signed)
Izora Gala w/ Home Health left vm.  Daughter called Izora Gala today and had forgotten to tell Izora Gala at OT visit that pt had had a fall about a week ago, they were unsure of date.  Fell when getting out of bed to use bedside toilet.  No visible skin tears, was able to walk at baseline.  Izora Gala educated them about using bedside potty and placement; moving furniture for less of a trip hazard.  That was supposed to be last visit but now is requesting additional orders for at least once a week for the next three week visits to work with daughter, Butch Penny, who is more involved in pt's care.

## 2014-02-03 NOTE — Telephone Encounter (Signed)
Please advise if okay to give verbal order.

## 2014-02-03 NOTE — Telephone Encounter (Signed)
[place] for extension on visits.  Just let me know if any problems from the fall.

## 2014-02-03 NOTE — Telephone Encounter (Signed)
Left verbal order on [employer]

## 2014-02-04 DIAGNOSIS — I10 Essential (primary) hypertension: Secondary | ICD-10-CM | POA: Diagnosis not present

## 2014-02-04 DIAGNOSIS — I2510 Atherosclerotic heart disease of native coronary artery without angina pectoris: Secondary | ICD-10-CM | POA: Diagnosis not present

## 2014-02-04 DIAGNOSIS — F411 Generalized anxiety disorder: Secondary | ICD-10-CM | POA: Diagnosis not present

## 2014-02-04 DIAGNOSIS — I509 Heart failure, unspecified: Secondary | ICD-10-CM | POA: Diagnosis not present

## 2014-02-04 DIAGNOSIS — I2789 Other specified pulmonary heart diseases: Secondary | ICD-10-CM | POA: Diagnosis not present

## 2014-02-04 DIAGNOSIS — H3530 Unspecified macular degeneration: Secondary | ICD-10-CM | POA: Diagnosis not present

## 2014-02-05 DIAGNOSIS — I2510 Atherosclerotic heart disease of native coronary artery without angina pectoris: Secondary | ICD-10-CM | POA: Diagnosis not present

## 2014-02-05 DIAGNOSIS — I509 Heart failure, unspecified: Secondary | ICD-10-CM | POA: Diagnosis not present

## 2014-02-05 DIAGNOSIS — H3530 Unspecified macular degeneration: Secondary | ICD-10-CM | POA: Diagnosis not present

## 2014-02-05 DIAGNOSIS — I10 Essential (primary) hypertension: Secondary | ICD-10-CM | POA: Diagnosis not present

## 2014-02-05 DIAGNOSIS — I2789 Other specified pulmonary heart diseases: Secondary | ICD-10-CM | POA: Diagnosis not present

## 2014-02-05 DIAGNOSIS — F411 Generalized anxiety disorder: Secondary | ICD-10-CM | POA: Diagnosis not present

## 2014-02-07 ENCOUNTER — Telehealth: Payer: Self-pay | Admitting: Internal Medicine

## 2014-02-07 DIAGNOSIS — I509 Heart failure, unspecified: Secondary | ICD-10-CM | POA: Diagnosis not present

## 2014-02-07 DIAGNOSIS — H3530 Unspecified macular degeneration: Secondary | ICD-10-CM | POA: Diagnosis not present

## 2014-02-07 DIAGNOSIS — I10 Essential (primary) hypertension: Secondary | ICD-10-CM | POA: Diagnosis not present

## 2014-02-07 DIAGNOSIS — I2510 Atherosclerotic heart disease of native coronary artery without angina pectoris: Secondary | ICD-10-CM | POA: Diagnosis not present

## 2014-02-07 DIAGNOSIS — F411 Generalized anxiety disorder: Secondary | ICD-10-CM | POA: Diagnosis not present

## 2014-02-07 DIAGNOSIS — I2789 Other specified pulmonary heart diseases: Secondary | ICD-10-CM | POA: Diagnosis not present

## 2014-02-07 NOTE — Telephone Encounter (Signed)
Please advise 

## 2014-02-07 NOTE — Telephone Encounter (Signed)
As per 01/31/14 phone note, I recommended for her to go to ortho for further evaluation and treatment for the pain.  I have placed an order for the referral.  I am not sure where we stand as far as getting an appt.

## 2014-02-07 NOTE — Telephone Encounter (Signed)
Patient called in requesting pain medication. She states that the xray showed arthritis in her shoulder however she isn't able to sleep due to pain. She states it is a constant pain and would like something for pain. Please advise.

## 2014-02-07 NOTE — Telephone Encounter (Signed)
Pt needs referral asap to [facility] (Dr. Rhona Raider in [facility] call daughter  Butch Penny) with appt at: [phone].

## 2014-02-09 DIAGNOSIS — H3530 Unspecified macular degeneration: Secondary | ICD-10-CM | POA: Diagnosis not present

## 2014-02-09 DIAGNOSIS — I509 Heart failure, unspecified: Secondary | ICD-10-CM | POA: Diagnosis not present

## 2014-02-09 DIAGNOSIS — F411 Generalized anxiety disorder: Secondary | ICD-10-CM | POA: Diagnosis not present

## 2014-02-09 DIAGNOSIS — I10 Essential (primary) hypertension: Secondary | ICD-10-CM | POA: Diagnosis not present

## 2014-02-09 DIAGNOSIS — I2789 Other specified pulmonary heart diseases: Secondary | ICD-10-CM | POA: Diagnosis not present

## 2014-02-09 DIAGNOSIS — I2510 Atherosclerotic heart disease of native coronary artery without angina pectoris: Secondary | ICD-10-CM | POA: Diagnosis not present

## 2014-02-11 DIAGNOSIS — F0280 Dementia in other diseases classified elsewhere without behavioral disturbance: Secondary | ICD-10-CM | POA: Diagnosis not present

## 2014-02-11 DIAGNOSIS — F330 Major depressive disorder, recurrent, mild: Secondary | ICD-10-CM | POA: Diagnosis not present

## 2014-02-11 DIAGNOSIS — I10 Essential (primary) hypertension: Secondary | ICD-10-CM | POA: Diagnosis not present

## 2014-02-11 DIAGNOSIS — F411 Generalized anxiety disorder: Secondary | ICD-10-CM | POA: Diagnosis not present

## 2014-02-11 DIAGNOSIS — I2510 Atherosclerotic heart disease of native coronary artery without angina pectoris: Secondary | ICD-10-CM | POA: Diagnosis not present

## 2014-02-11 DIAGNOSIS — H3530 Unspecified macular degeneration: Secondary | ICD-10-CM | POA: Diagnosis not present

## 2014-02-11 DIAGNOSIS — I2789 Other specified pulmonary heart diseases: Secondary | ICD-10-CM | POA: Diagnosis not present

## 2014-02-11 DIAGNOSIS — I509 Heart failure, unspecified: Secondary | ICD-10-CM | POA: Diagnosis not present

## 2014-02-14 DIAGNOSIS — I2789 Other specified pulmonary heart diseases: Secondary | ICD-10-CM | POA: Diagnosis not present

## 2014-02-14 DIAGNOSIS — I2510 Atherosclerotic heart disease of native coronary artery without angina pectoris: Secondary | ICD-10-CM | POA: Diagnosis not present

## 2014-02-14 DIAGNOSIS — I10 Essential (primary) hypertension: Secondary | ICD-10-CM | POA: Diagnosis not present

## 2014-02-14 DIAGNOSIS — F411 Generalized anxiety disorder: Secondary | ICD-10-CM | POA: Diagnosis not present

## 2014-02-14 DIAGNOSIS — I509 Heart failure, unspecified: Secondary | ICD-10-CM | POA: Diagnosis not present

## 2014-02-14 DIAGNOSIS — H3530 Unspecified macular degeneration: Secondary | ICD-10-CM | POA: Diagnosis not present

## 2014-02-15 DIAGNOSIS — R300 Dysuria: Secondary | ICD-10-CM | POA: Diagnosis not present

## 2014-02-15 DIAGNOSIS — N819 Female genital prolapse, unspecified: Secondary | ICD-10-CM | POA: Diagnosis not present

## 2014-02-15 DIAGNOSIS — N368 Other specified disorders of urethra: Secondary | ICD-10-CM | POA: Diagnosis not present

## 2014-02-15 DIAGNOSIS — N3946 Mixed incontinence: Secondary | ICD-10-CM | POA: Diagnosis not present

## 2014-02-15 DIAGNOSIS — N8110 Cystocele, unspecified: Secondary | ICD-10-CM | POA: Insufficient documentation

## 2014-02-16 DIAGNOSIS — H3530 Unspecified macular degeneration: Secondary | ICD-10-CM | POA: Diagnosis not present

## 2014-02-16 DIAGNOSIS — I2510 Atherosclerotic heart disease of native coronary artery without angina pectoris: Secondary | ICD-10-CM | POA: Diagnosis not present

## 2014-02-16 DIAGNOSIS — F411 Generalized anxiety disorder: Secondary | ICD-10-CM | POA: Diagnosis not present

## 2014-02-16 DIAGNOSIS — I10 Essential (primary) hypertension: Secondary | ICD-10-CM | POA: Diagnosis not present

## 2014-02-16 DIAGNOSIS — M546 Pain in thoracic spine: Secondary | ICD-10-CM | POA: Diagnosis not present

## 2014-02-16 DIAGNOSIS — I509 Heart failure, unspecified: Secondary | ICD-10-CM | POA: Diagnosis not present

## 2014-02-16 DIAGNOSIS — I2789 Other specified pulmonary heart diseases: Secondary | ICD-10-CM | POA: Diagnosis not present

## 2014-02-16 DIAGNOSIS — L905 Scar conditions and fibrosis of skin: Secondary | ICD-10-CM | POA: Diagnosis not present

## 2014-02-17 DIAGNOSIS — F411 Generalized anxiety disorder: Secondary | ICD-10-CM | POA: Diagnosis not present

## 2014-02-17 DIAGNOSIS — I509 Heart failure, unspecified: Secondary | ICD-10-CM | POA: Diagnosis not present

## 2014-02-17 DIAGNOSIS — H3530 Unspecified macular degeneration: Secondary | ICD-10-CM | POA: Diagnosis not present

## 2014-02-17 DIAGNOSIS — I2510 Atherosclerotic heart disease of native coronary artery without angina pectoris: Secondary | ICD-10-CM | POA: Diagnosis not present

## 2014-02-17 DIAGNOSIS — I10 Essential (primary) hypertension: Secondary | ICD-10-CM | POA: Diagnosis not present

## 2014-02-17 DIAGNOSIS — I2789 Other specified pulmonary heart diseases: Secondary | ICD-10-CM | POA: Diagnosis not present

## 2014-02-18 DIAGNOSIS — H3530 Unspecified macular degeneration: Secondary | ICD-10-CM | POA: Diagnosis not present

## 2014-02-18 DIAGNOSIS — I10 Essential (primary) hypertension: Secondary | ICD-10-CM | POA: Diagnosis not present

## 2014-02-18 DIAGNOSIS — I2510 Atherosclerotic heart disease of native coronary artery without angina pectoris: Secondary | ICD-10-CM | POA: Diagnosis not present

## 2014-02-18 DIAGNOSIS — I509 Heart failure, unspecified: Secondary | ICD-10-CM | POA: Diagnosis not present

## 2014-02-18 DIAGNOSIS — I2789 Other specified pulmonary heart diseases: Secondary | ICD-10-CM | POA: Diagnosis not present

## 2014-02-18 DIAGNOSIS — F411 Generalized anxiety disorder: Secondary | ICD-10-CM | POA: Diagnosis not present

## 2014-02-21 DIAGNOSIS — H3530 Unspecified macular degeneration: Secondary | ICD-10-CM | POA: Diagnosis not present

## 2014-02-21 DIAGNOSIS — I2789 Other specified pulmonary heart diseases: Secondary | ICD-10-CM | POA: Diagnosis not present

## 2014-02-21 DIAGNOSIS — I509 Heart failure, unspecified: Secondary | ICD-10-CM | POA: Diagnosis not present

## 2014-02-21 DIAGNOSIS — I2510 Atherosclerotic heart disease of native coronary artery without angina pectoris: Secondary | ICD-10-CM | POA: Diagnosis not present

## 2014-02-21 DIAGNOSIS — I10 Essential (primary) hypertension: Secondary | ICD-10-CM | POA: Diagnosis not present

## 2014-02-21 DIAGNOSIS — F411 Generalized anxiety disorder: Secondary | ICD-10-CM | POA: Diagnosis not present

## 2014-02-22 DIAGNOSIS — I2789 Other specified pulmonary heart diseases: Secondary | ICD-10-CM | POA: Diagnosis not present

## 2014-02-22 DIAGNOSIS — H3530 Unspecified macular degeneration: Secondary | ICD-10-CM | POA: Diagnosis not present

## 2014-02-22 DIAGNOSIS — I509 Heart failure, unspecified: Secondary | ICD-10-CM | POA: Diagnosis not present

## 2014-02-22 DIAGNOSIS — I10 Essential (primary) hypertension: Secondary | ICD-10-CM | POA: Diagnosis not present

## 2014-02-22 DIAGNOSIS — F411 Generalized anxiety disorder: Secondary | ICD-10-CM | POA: Diagnosis not present

## 2014-02-22 DIAGNOSIS — I2510 Atherosclerotic heart disease of native coronary artery without angina pectoris: Secondary | ICD-10-CM | POA: Diagnosis not present

## 2014-02-23 DIAGNOSIS — I2789 Other specified pulmonary heart diseases: Secondary | ICD-10-CM | POA: Diagnosis not present

## 2014-02-23 DIAGNOSIS — F411 Generalized anxiety disorder: Secondary | ICD-10-CM | POA: Diagnosis not present

## 2014-02-23 DIAGNOSIS — I509 Heart failure, unspecified: Secondary | ICD-10-CM | POA: Diagnosis not present

## 2014-02-23 DIAGNOSIS — I10 Essential (primary) hypertension: Secondary | ICD-10-CM | POA: Diagnosis not present

## 2014-02-23 DIAGNOSIS — I2510 Atherosclerotic heart disease of native coronary artery without angina pectoris: Secondary | ICD-10-CM | POA: Diagnosis not present

## 2014-02-23 DIAGNOSIS — H3530 Unspecified macular degeneration: Secondary | ICD-10-CM | POA: Diagnosis not present

## 2014-02-25 DIAGNOSIS — F411 Generalized anxiety disorder: Secondary | ICD-10-CM | POA: Diagnosis not present

## 2014-02-25 DIAGNOSIS — I509 Heart failure, unspecified: Secondary | ICD-10-CM | POA: Diagnosis not present

## 2014-02-25 DIAGNOSIS — I2789 Other specified pulmonary heart diseases: Secondary | ICD-10-CM | POA: Diagnosis not present

## 2014-02-25 DIAGNOSIS — I2510 Atherosclerotic heart disease of native coronary artery without angina pectoris: Secondary | ICD-10-CM | POA: Diagnosis not present

## 2014-02-25 DIAGNOSIS — H3530 Unspecified macular degeneration: Secondary | ICD-10-CM | POA: Diagnosis not present

## 2014-02-25 DIAGNOSIS — I10 Essential (primary) hypertension: Secondary | ICD-10-CM | POA: Diagnosis not present

## 2014-02-28 DIAGNOSIS — I2789 Other specified pulmonary heart diseases: Secondary | ICD-10-CM | POA: Diagnosis not present

## 2014-02-28 DIAGNOSIS — I10 Essential (primary) hypertension: Secondary | ICD-10-CM | POA: Diagnosis not present

## 2014-02-28 DIAGNOSIS — I509 Heart failure, unspecified: Secondary | ICD-10-CM | POA: Diagnosis not present

## 2014-02-28 DIAGNOSIS — I2510 Atherosclerotic heart disease of native coronary artery without angina pectoris: Secondary | ICD-10-CM | POA: Diagnosis not present

## 2014-02-28 DIAGNOSIS — J069 Acute upper respiratory infection, unspecified: Secondary | ICD-10-CM | POA: Diagnosis not present

## 2014-02-28 DIAGNOSIS — R05 Cough: Secondary | ICD-10-CM | POA: Diagnosis not present

## 2014-02-28 DIAGNOSIS — F411 Generalized anxiety disorder: Secondary | ICD-10-CM | POA: Diagnosis not present

## 2014-02-28 DIAGNOSIS — H6120 Impacted cerumen, unspecified ear: Secondary | ICD-10-CM | POA: Diagnosis not present

## 2014-02-28 DIAGNOSIS — H3530 Unspecified macular degeneration: Secondary | ICD-10-CM | POA: Diagnosis not present

## 2014-02-28 DIAGNOSIS — R059 Cough, unspecified: Secondary | ICD-10-CM | POA: Diagnosis not present

## 2014-03-01 DIAGNOSIS — I2510 Atherosclerotic heart disease of native coronary artery without angina pectoris: Secondary | ICD-10-CM | POA: Diagnosis not present

## 2014-03-01 DIAGNOSIS — F411 Generalized anxiety disorder: Secondary | ICD-10-CM | POA: Diagnosis not present

## 2014-03-01 DIAGNOSIS — I10 Essential (primary) hypertension: Secondary | ICD-10-CM | POA: Diagnosis not present

## 2014-03-01 DIAGNOSIS — I2789 Other specified pulmonary heart diseases: Secondary | ICD-10-CM | POA: Diagnosis not present

## 2014-03-01 DIAGNOSIS — I509 Heart failure, unspecified: Secondary | ICD-10-CM | POA: Diagnosis not present

## 2014-03-01 DIAGNOSIS — H3530 Unspecified macular degeneration: Secondary | ICD-10-CM | POA: Diagnosis not present

## 2014-03-02 DIAGNOSIS — F411 Generalized anxiety disorder: Secondary | ICD-10-CM | POA: Diagnosis not present

## 2014-03-02 DIAGNOSIS — I509 Heart failure, unspecified: Secondary | ICD-10-CM | POA: Diagnosis not present

## 2014-03-02 DIAGNOSIS — I2789 Other specified pulmonary heart diseases: Secondary | ICD-10-CM | POA: Diagnosis not present

## 2014-03-02 DIAGNOSIS — I10 Essential (primary) hypertension: Secondary | ICD-10-CM | POA: Diagnosis not present

## 2014-03-02 DIAGNOSIS — I2510 Atherosclerotic heart disease of native coronary artery without angina pectoris: Secondary | ICD-10-CM | POA: Diagnosis not present

## 2014-03-02 DIAGNOSIS — H3530 Unspecified macular degeneration: Secondary | ICD-10-CM | POA: Diagnosis not present

## 2014-03-03 DIAGNOSIS — F411 Generalized anxiety disorder: Secondary | ICD-10-CM | POA: Diagnosis not present

## 2014-03-03 DIAGNOSIS — I2789 Other specified pulmonary heart diseases: Secondary | ICD-10-CM | POA: Diagnosis not present

## 2014-03-03 DIAGNOSIS — H3530 Unspecified macular degeneration: Secondary | ICD-10-CM | POA: Diagnosis not present

## 2014-03-03 DIAGNOSIS — I509 Heart failure, unspecified: Secondary | ICD-10-CM | POA: Diagnosis not present

## 2014-03-03 DIAGNOSIS — I10 Essential (primary) hypertension: Secondary | ICD-10-CM | POA: Diagnosis not present

## 2014-03-03 DIAGNOSIS — E785 Hyperlipidemia, unspecified: Secondary | ICD-10-CM | POA: Diagnosis not present

## 2014-03-03 DIAGNOSIS — I2510 Atherosclerotic heart disease of native coronary artery without angina pectoris: Secondary | ICD-10-CM | POA: Diagnosis not present

## 2014-03-04 ENCOUNTER — Emergency Department (HOSPITAL_COMMUNITY): Payer: Medicare Other

## 2014-03-04 ENCOUNTER — Emergency Department (HOSPITAL_COMMUNITY)
Admission: EM | Admit: 2014-03-04 | Discharge: 2014-03-04 | Disposition: A | Discharge status: Home / Self Care | Payer: Medicare Other | Attending: Emergency Medicine | Admitting: Emergency Medicine

## 2014-03-04 ENCOUNTER — Encounter (HOSPITAL_COMMUNITY): Payer: Self-pay | Admitting: Emergency Medicine

## 2014-03-04 DIAGNOSIS — K219 Gastro-esophageal reflux disease without esophagitis: Secondary | ICD-10-CM | POA: Insufficient documentation

## 2014-03-04 DIAGNOSIS — J159 Unspecified bacterial pneumonia: Secondary | ICD-10-CM | POA: Insufficient documentation

## 2014-03-04 DIAGNOSIS — M129 Arthropathy, unspecified: Secondary | ICD-10-CM | POA: Insufficient documentation

## 2014-03-04 DIAGNOSIS — I10 Essential (primary) hypertension: Secondary | ICD-10-CM | POA: Diagnosis not present

## 2014-03-04 DIAGNOSIS — R05 Cough: Secondary | ICD-10-CM | POA: Diagnosis not present

## 2014-03-04 DIAGNOSIS — H409 Unspecified glaucoma: Secondary | ICD-10-CM | POA: Diagnosis not present

## 2014-03-04 DIAGNOSIS — J189 Pneumonia, unspecified organism: Secondary | ICD-10-CM

## 2014-03-04 DIAGNOSIS — J45901 Unspecified asthma with (acute) exacerbation: Secondary | ICD-10-CM | POA: Insufficient documentation

## 2014-03-04 DIAGNOSIS — Z7982 Long term (current) use of aspirin: Secondary | ICD-10-CM | POA: Diagnosis not present

## 2014-03-04 DIAGNOSIS — Z79899 Other long term (current) drug therapy: Secondary | ICD-10-CM | POA: Diagnosis not present

## 2014-03-04 DIAGNOSIS — R059 Cough, unspecified: Secondary | ICD-10-CM | POA: Diagnosis not present

## 2014-03-04 DIAGNOSIS — E7800 Pure hypercholesterolemia, unspecified: Secondary | ICD-10-CM | POA: Diagnosis not present

## 2014-03-04 DIAGNOSIS — R5381 Other malaise: Secondary | ICD-10-CM | POA: Diagnosis not present

## 2014-03-04 DIAGNOSIS — J9819 Other pulmonary collapse: Secondary | ICD-10-CM | POA: Diagnosis not present

## 2014-03-04 DIAGNOSIS — Z8541 Personal history of malignant neoplasm of cervix uteri: Secondary | ICD-10-CM | POA: Insufficient documentation

## 2014-03-04 DIAGNOSIS — R0902 Hypoxemia: Secondary | ICD-10-CM | POA: Diagnosis not present

## 2014-03-04 DIAGNOSIS — R5383 Other fatigue: Secondary | ICD-10-CM | POA: Diagnosis not present

## 2014-03-04 LAB — COMPREHENSIVE METABOLIC PANEL
ALBUMIN: 3.5 g/dL (ref 3.5–5.2)
ALT: 11 U/L (ref 0–35)
AST: 14 U/L (ref 0–37)
Alkaline Phosphatase: 128 U/L — ABNORMAL HIGH (ref 39–117)
BILIRUBIN TOTAL: 0.6 mg/dL (ref 0.3–1.2)
BUN: 12 mg/dL (ref 6–23)
CHLORIDE: 100 meq/L (ref 96–112)
CO2: 26 meq/L (ref 19–32)
CREATININE: 0.77 mg/dL (ref 0.50–1.10)
Calcium: 9.8 mg/dL (ref 8.4–10.5)
GFR, EST AFRICAN AMERICAN: 86 mL/min — AB (ref >90)
GFR, EST NON AFRICAN AMERICAN: 74 mL/min — AB (ref >90)
GLUCOSE: 147 mg/dL — AB (ref 70–99)
Potassium: 4.1 mEq/L (ref 3.7–5.3)
Sodium: 140 mEq/L (ref 137–147)
Total Protein: 7.3 g/dL (ref 6.0–8.3)

## 2014-03-04 LAB — CBC WITH DIFFERENTIAL/PLATELET
BASOS PCT: 0 % (ref 0–1)
Basophils Absolute: 0 10*3/uL (ref 0.0–0.1)
Eosinophils Absolute: 0.1 10*3/uL (ref 0.0–0.7)
Eosinophils Relative: 1 % (ref 0–5)
HEMATOCRIT: 36.9 % (ref 36.0–46.0)
HEMOGLOBIN: 12.3 g/dL (ref 12.0–15.0)
Lymphocytes Relative: 12 % (ref 12–46)
Lymphs Abs: 1.1 10*3/uL (ref 0.7–4.0)
MCH: 30.9 pg (ref 26.0–34.0)
MCHC: 33.3 g/dL (ref 30.0–36.0)
MCV: 92.7 fL (ref 78.0–100.0)
MONO ABS: 0.8 10*3/uL (ref 0.1–1.0)
MONOS PCT: 8 % (ref 3–12)
NEUTROS ABS: 7.6 10*3/uL (ref 1.7–7.7)
Neutrophils Relative %: 79 % — ABNORMAL HIGH (ref 43–77)
Platelets: 209 10*3/uL (ref 150–400)
RBC: 3.98 MIL/uL (ref 3.87–5.11)
RDW: 13.6 % (ref 11.5–15.5)
WBC: 9.6 10*3/uL (ref 4.0–10.5)

## 2014-03-04 LAB — I-STAT CG4 LACTIC ACID, ED: LACTIC ACID, VENOUS: 0.77 mmol/L (ref 0.5–2.2)

## 2014-03-04 MED ORDER — LEVOFLOXACIN 500 MG PO TABS
500.0000 mg | ORAL_TABLET | Freq: Every day | ORAL | Status: DC
Start: 2014-03-04 — End: 2014-05-30

## 2014-03-04 MED ORDER — LEVOFLOXACIN 500 MG PO TABS
500.0000 mg | ORAL_TABLET | Freq: Once | ORAL | Status: AC
  Administered 2014-03-04: 500 mg via ORAL
  Filled 2014-03-04: qty 1

## 2014-03-04 MED ORDER — ACETAMINOPHEN 325 MG PO TABS
650.0000 mg | ORAL_TABLET | Freq: Once | ORAL | Status: AC
  Administered 2014-03-04: 650 mg via ORAL
  Filled 2014-03-04: qty 2

## 2014-03-04 NOTE — ED Notes (Signed)
The pt has had a cough and a cold for one week.  She was [facility] on Monday and given meds.  After choking on her sputum earlier today she went back to [facility]   xrays performed and dx of pneumonia.  Sent here  For treatment.  She denies having a temp however now she has a low grade temp.  Hoarse cough.  She has mid chest pain only when she coughs.   Family at the bedside

## 2014-03-04 NOTE — ED Notes (Addendum)
Present with worsening pneumonia, sent over from [facility] in [place], has been taking amoxicillin since Monday with no relief. X ray today showed a large pneumonia in right lung. Productive cough with white phlegm. Satts 94% RA. Temp 100.4 orally. Bilateral breath sounds diminished. Family reports confusion at times. Pt is alert and oriented.  Pt has xrays with her from [facility]

## 2014-03-04 NOTE — ED Notes (Signed)
The pt is in x-ray

## 2014-03-04 NOTE — ED Provider Notes (Signed)
CSN: [id number]     Arrival date & time 03/04/14  1509 History   First MD Initiated Contact with Patient 03/04/14 1730     Chief Complaint  Patient presents with  . Pneumonia     (Consider location/radiation/quality/duration/timing/severity/associated sxs/prior Treatment) HPI 80 year old female presents with cough x1 week. Nicki Reaper has been mildly productive the most part is a dry cough. The patient has been having shortness of breath with exertion during this time. She's not noticed any temperature. She was started on amoxicillin about 5 days ago and her symptoms have not improved. She went to urgent care today in [place] where they took an x-ray that showed "large pneumonia in the right lung". She brought x-ray disc. Patient denies any pain anywhere. She was recommended to come to the ER.  Past Medical History  Diagnosis Date  . Cancer     uterine and questionable vulvar, s/p hysterectomy  . Hypercholesterolemia   . Chronic headaches   . GERD (gastroesophageal reflux disease)   . Glaucoma   . Arthritis   . Right bundle branch block   . Reactive airway disease   . Cystocele   . Rectocele   . Hypertension   . Macular degeneration    Past Surgical History  Procedure Laterality Date  . Cholecystectomy    . Appendectomy    . Abdominal hysterectomy    . Vulvectomy     Family History  Problem Relation Age of Onset  . Heart disease Mother   . Cancer Father     leukemia  . Heart disease Brother     x2  . Cancer Daughter     breast   History  Substance Use Topics  . Smoking status: Never Smoker   . Smokeless tobacco: Never Used  . Alcohol Use: No   OB History   Grav Para Term Preterm Abortions TAB SAB Ect Mult Living                 Review of Systems  Constitutional: Negative for fever.  Respiratory: Positive for cough and shortness of breath.   Cardiovascular: Negative for chest pain.  Gastrointestinal: Negative for vomiting.  Psychiatric/Behavioral: Negative  for confusion (intermittent confusion, last yesterday).  All other systems reviewed and are negative.     Allergies  Ambien and Codeine  Home Medications   Prior to Admission medications   Medication Sig Start Date End Date Taking? Authorizing Provider  aspirin 325 MG tablet Take 1 tablet (325 mg total) by mouth daily. 10/13/13   Bobby Rumpf York, PA-C  atorvastatin (LIPITOR) 40 MG tablet Take 1 tablet (40 mg total) by mouth daily at 6 PM. 12/06/13   Alisa Graff, MD  brinzolamide (AZOPT) 1 % ophthalmic suspension Place 1 drop into the left eye 3 (three) times daily. 10/13/13   Bobby Rumpf York, PA-C  latanoprost (XALATAN) 0.005 % ophthalmic solution Place 1 drop into both eyes at bedtime. 10/13/13   Bobby Rumpf York, PA-C  memantine (NAMENDA) 10 MG tablet Take 10 mg by mouth at bedtime.    Chauncey Mann, MD  Multiple Vitamin (MULTIVITAMIN) tablet Take 1 tablet by mouth daily.    Historical Provider, MD  Multiple Vitamins-Minerals (PRESERVISION AREDS) TABS Take 1 tablet by mouth every morning.    Historical Provider, MD  Omega-3 Fatty Acids (FISH OIL CONCENTRATE PO) Take 1 capsule by mouth 2 (two) times daily. 1750mg     Historical Provider, MD  omeprazole (PRILOSEC) 20 MG capsule Take 1 capsule (20  mg total) by mouth every morning. 12/20/13   Alisa Graff, MD  sertraline (ZOLOFT) 100 MG tablet Take 100 mg by mouth daily.    Lenor Coffin, PA-C  travoprost, benzalkonium, (TRAVATAN) 0.004 % ophthalmic solution Place 1 drop into both eyes at bedtime.    Historical Provider, MD  traZODone (DESYREL) 50 MG tablet Take 50-100 mg by mouth every evening.    Chauncey Mann, MD  vitamin D, CHOLECALCIFEROL, 400 UNITS tablet Take 400 Units by mouth daily.    Historical Provider, MD   BP 151/62  Pulse 90  Temp(Src) 100.4 F (38 C) (Oral)  Resp 20  SpO2 93% Physical Exam  Vitals reviewed. Constitutional: She is oriented to person, place, and time. She appears well-developed and well-nourished.  HENT:   Head: Normocephalic and atraumatic.  Right Ear: External ear normal.  Left Ear: External ear normal.  Nose: Nose normal.  Eyes: Right eye exhibits no discharge. Left eye exhibits no discharge.  Cardiovascular: Normal rate, regular rhythm and normal heart sounds.   Pulmonary/Chest: Tachypnea noted. She has rales in the right lower field.  Abdominal: Soft. There is no tenderness.  Neurological: She is alert and oriented to person, place, and time.  Skin: Skin is warm and dry.    ED Course  Procedures (including critical care time) Labs Review Labs Reviewed  CBC WITH DIFFERENTIAL - Abnormal; Notable for the following:    Neutrophils Relative % 79 (*)    All other components within normal limits  COMPREHENSIVE METABOLIC PANEL - Abnormal; Notable for the following:    Glucose, Bld 147 (*)    Alkaline Phosphatase 128 (*)    GFR calc non Af Amer 74 (*)    GFR calc Af Amer 86 (*)    All other components within normal limits  CULTURE, BLOOD (ROUTINE X 2)  CULTURE, BLOOD (ROUTINE X 2)  I-STAT CG4 LACTIC ACID, ED    Imaging Review Dg Chest 2 View  03/04/2014   CLINICAL DATA:  Pneumonia, cough and congestion for 1 week. History of hypertension.  EXAM: CHEST  2 VIEW  COMPARISON:  Chest radiograph November 19, 2013  FINDINGS: Cardiac silhouette appears mildly enlarged. Mediastinal silhouette is nonsuspicious. Low inspiratory examination, without pleural effusions or focal consolidations. Minimal bibasilar atelectasis. Trachea projects midline and there is no pneumothorax.  Moderate degenerative change of the shoulders. Surgical clips in the abdomen may reflect cholecystectomy. Soft tissue planes are nonsuspicious.  IMPRESSION: Mild cardiomegaly with bibasilar atelectasis.   Electronically Signed   By: Elon Alas   On: 03/04/2014 18:51     EKG Interpretation None      MDM   Final diagnoses:  Community acquired pneumonia    Patient appears well here. Mild tachypnea (low 20 RR),  no hypoxia or respiratory distress. Bloodwork is benign. I do not see the "large pneumonia" that she states the urgent care doctor told her about. Given her asymmetric rales, will change her antibiotic to levaquin. As she appears well and is comfortable going home (lives with husband) and no current confusion or other concerns, I feel she can be discharged and follow up with PCP.    Ephraim Hamburger, MD 03/05/14 [phone]

## 2014-03-04 NOTE — ED Notes (Signed)
The pt just returned from xray daughter at the bedside

## 2014-03-08 DIAGNOSIS — F0280 Dementia in other diseases classified elsewhere without behavioral disturbance: Secondary | ICD-10-CM | POA: Diagnosis not present

## 2014-03-08 DIAGNOSIS — F330 Major depressive disorder, recurrent, mild: Secondary | ICD-10-CM | POA: Diagnosis not present

## 2014-03-09 DIAGNOSIS — I509 Heart failure, unspecified: Secondary | ICD-10-CM | POA: Diagnosis not present

## 2014-03-09 DIAGNOSIS — I10 Essential (primary) hypertension: Secondary | ICD-10-CM | POA: Diagnosis not present

## 2014-03-09 DIAGNOSIS — H3530 Unspecified macular degeneration: Secondary | ICD-10-CM | POA: Diagnosis not present

## 2014-03-09 DIAGNOSIS — F411 Generalized anxiety disorder: Secondary | ICD-10-CM | POA: Diagnosis not present

## 2014-03-09 DIAGNOSIS — I2510 Atherosclerotic heart disease of native coronary artery without angina pectoris: Secondary | ICD-10-CM | POA: Diagnosis not present

## 2014-03-09 DIAGNOSIS — I2789 Other specified pulmonary heart diseases: Secondary | ICD-10-CM | POA: Diagnosis not present

## 2014-03-10 ENCOUNTER — Encounter: Payer: Self-pay | Admitting: Internal Medicine

## 2014-03-10 ENCOUNTER — Ambulatory Visit (INDEPENDENT_AMBULATORY_CARE_PROVIDER_SITE_OTHER): Payer: Medicare Other | Admitting: Internal Medicine

## 2014-03-10 VITALS — BP 130/80 | HR 81 | Temp 98.7°F | Ht 62.0 in | Wt 157.0 lb

## 2014-03-10 DIAGNOSIS — F329 Major depressive disorder, single episode, unspecified: Secondary | ICD-10-CM

## 2014-03-10 DIAGNOSIS — I2510 Atherosclerotic heart disease of native coronary artery without angina pectoris: Secondary | ICD-10-CM | POA: Diagnosis not present

## 2014-03-10 DIAGNOSIS — R05 Cough: Secondary | ICD-10-CM

## 2014-03-10 DIAGNOSIS — H3530 Unspecified macular degeneration: Secondary | ICD-10-CM | POA: Diagnosis not present

## 2014-03-10 DIAGNOSIS — I2789 Other specified pulmonary heart diseases: Secondary | ICD-10-CM | POA: Diagnosis not present

## 2014-03-10 DIAGNOSIS — I10 Essential (primary) hypertension: Secondary | ICD-10-CM | POA: Diagnosis not present

## 2014-03-10 DIAGNOSIS — F3289 Other specified depressive episodes: Secondary | ICD-10-CM | POA: Diagnosis not present

## 2014-03-10 DIAGNOSIS — R059 Cough, unspecified: Secondary | ICD-10-CM

## 2014-03-10 DIAGNOSIS — H547 Unspecified visual loss: Secondary | ICD-10-CM | POA: Diagnosis not present

## 2014-03-10 DIAGNOSIS — F411 Generalized anxiety disorder: Secondary | ICD-10-CM | POA: Diagnosis not present

## 2014-03-10 DIAGNOSIS — F32A Depression, unspecified: Secondary | ICD-10-CM

## 2014-03-10 DIAGNOSIS — I509 Heart failure, unspecified: Secondary | ICD-10-CM | POA: Diagnosis not present

## 2014-03-10 LAB — CULTURE, BLOOD (ROUTINE X 2)
Culture: NO GROWTH
Culture: NO GROWTH

## 2014-03-10 MED ORDER — FLUTICASONE PROPIONATE 50 MCG/ACT NA SUSP
2.0000 | Freq: Every day | NASAL | Status: DC
Start: 2014-03-10 — End: 2019-12-30

## 2014-03-10 MED ORDER — ALBUTEROL SULFATE HFA 108 (90 BASE) MCG/ACT IN AERS
2.0000 | INHALATION_SPRAY | Freq: Four times a day (QID) | RESPIRATORY_TRACT | Status: DC | PRN
Start: 2014-03-10 — End: 2014-05-30

## 2014-03-10 NOTE — Patient Instructions (Signed)
Saline nasal spray - flush nose at least 2-3x/day.    Flonase nasal spray - 2 sprays each nostril one time per day.  Do this in the evening  Robitussin as directed.    Continue the antibiotics.

## 2014-03-10 NOTE — Progress Notes (Signed)
Pre visit review using our clinic review tool, if applicable. No additional management support is needed unless otherwise documented below in the visit note. 

## 2014-03-11 DIAGNOSIS — H3530 Unspecified macular degeneration: Secondary | ICD-10-CM | POA: Diagnosis not present

## 2014-03-11 DIAGNOSIS — I509 Heart failure, unspecified: Secondary | ICD-10-CM | POA: Diagnosis not present

## 2014-03-11 DIAGNOSIS — F411 Generalized anxiety disorder: Secondary | ICD-10-CM | POA: Diagnosis not present

## 2014-03-11 DIAGNOSIS — I10 Essential (primary) hypertension: Secondary | ICD-10-CM | POA: Diagnosis not present

## 2014-03-11 DIAGNOSIS — I2789 Other specified pulmonary heart diseases: Secondary | ICD-10-CM | POA: Diagnosis not present

## 2014-03-11 DIAGNOSIS — I2510 Atherosclerotic heart disease of native coronary artery without angina pectoris: Secondary | ICD-10-CM | POA: Diagnosis not present

## 2014-03-14 ENCOUNTER — Encounter: Payer: Self-pay | Admitting: Internal Medicine

## 2014-03-14 DIAGNOSIS — R05 Cough: Secondary | ICD-10-CM | POA: Insufficient documentation

## 2014-03-14 DIAGNOSIS — R059 Cough, unspecified: Secondary | ICD-10-CM | POA: Insufficient documentation

## 2014-03-14 NOTE — Assessment & Plan Note (Signed)
On zoloft and trazodone.  Seeing Dr Nicolasa Ducking.  Follow.

## 2014-03-14 NOTE — Assessment & Plan Note (Signed)
Visual field defect - lower 1/2.  Worked up in the hospital.  On aspirin.  Has seen opthalmology.

## 2014-03-14 NOTE — Assessment & Plan Note (Signed)
With persistent increased cough and congestion.  Better.  On Levaquin.  CXR as outlined.  Will add mucinex/robitussin as directed.  Saline nasal spray and nasacort or flonase as directed.  Had sample of asmanex.  Instructed on proper way to use the inhaler.  Rescue inhaler if needed.  Hold on prednisone at this time.  Follow.

## 2014-03-14 NOTE — Assessment & Plan Note (Signed)
Blood pressure doing relatively well on no medication.  Follow.

## 2014-03-14 NOTE — Progress Notes (Signed)
Subjective:    Patient ID: [name], female    DOB: 1926-02-02, 80 y.o.   MRN: [id number]  HPI 80 year old female with past history of uterine and vulvar cancer, hypertension and hypercholesterolemia who comes in today as a work in with concerns regarding some persistent cough and congestion.   She is accompanied by her daughter.  History obtained from both of them.  Has been having issues with increased depression and increased stress.  Seeing Dr Nicolasa Ducking  On zoloft and trazodone.  Doing better.  The previous light headedness and dizziness have resolved.  She is still having the pain behind the left eye.  Has had ENT evaluation and has seen two neurologists.  Has had MRI.  Unclear as to the exact etiology.  We had placed her on prednisone to see if this would improve her headaches.  While on the prednisone, she reported no difference in the headaches.  Temporal artery biopsy was negative.  Prednisone stopped. Has declined referral back to neurology.  She has recently been evaluated by cardiology (Dr Manuella Ghazi at [place] at [facility]).  States had ECHO and holter monitor.  States everything has checked out fine.  She was recently admitted with acute vision loss.  Still with visual field defect.  Limits her mobility.  Using a walker around her house.    She reports that starting three weeks ago, she developed increased cough and chest congestion.  Chest felt tight.  Went to Urgent Care.  Was placed on Tessalon Perles and given amoxicillin.  Symptoms persisted, so last week she went back to urgent care.  Referred to ER.  Went to ER at [facility].  CXR revealed bibasilar atelectasis.  Amoxicillin was changed to Levaquin.  She still reports some cough and congestion, but she is improved.  Chest sore from increased coughing.  Not on any inhalers.  She is eating.  No nausea or vomiting.  No bowel change.      Past Medical History  Diagnosis Date  . Cancer     uterine and questionable vulvar, s/p hysterectomy  .  Hypercholesterolemia   . Chronic headaches   . GERD (gastroesophageal reflux disease)   . Glaucoma   . Arthritis   . Right bundle branch block   . Reactive airway disease   . Cystocele   . Rectocele   . Hypertension   . Macular degeneration     Current Outpatient Prescriptions on File Prior to Visit  Medication Sig Dispense Refill  . aspirin EC 81 MG tablet Take 81 mg by mouth daily.      [name] atorvastatin (LIPITOR) 40 MG tablet Take 1 tablet (40 mg total) by mouth daily at 6 PM.  30 tablet  5  . brinzolamide (AZOPT) 1 % ophthalmic suspension Place 1 drop into the left eye 3 (three) times daily.  10 mL  12  . HYDROcodone-acetaminophen (NORCO/VICODIN) 5-325 MG per tablet Take 1 tablet by mouth at bedtime as needed for moderate pain.      [name] latanoprost (XALATAN) 0.005 % ophthalmic solution Place 1 drop into both eyes at bedtime.  2.5 mL  12  . levofloxacin (LEVAQUIN) 500 MG tablet Take 1 tablet (500 mg total) by mouth daily.  10 tablet  0  . memantine (NAMENDA) 10 MG tablet Take 10 mg by mouth at bedtime.      . Multiple Vitamin (MULTIVITAMIN) tablet Take 1 tablet by mouth daily.      . Multiple Vitamins-Minerals (PRESERVISION AREDS)  TABS Take 1 tablet by mouth every morning.      . Omega-3 Fatty Acids (FISH OIL CONCENTRATE PO) Take 1 capsule by mouth 2 (two) times daily. 1750mg       . omeprazole (PRILOSEC) 20 MG capsule Take 1 capsule (20 mg total) by mouth every morning.  30 capsule  5  . sertraline (ZOLOFT) 100 MG tablet Take 100 mg by mouth daily.      . travoprost, benzalkonium, (TRAVATAN) 0.004 % ophthalmic solution Place 1 drop into both eyes at bedtime.      . traZODone (DESYREL) 50 MG tablet Take 50-100 mg by mouth every evening.      . vitamin D, CHOLECALCIFEROL, 400 UNITS tablet Take 400 Units by mouth daily.       No current facility-administered medications on file prior to visit.    Review of Systems No light headedness or dizziness.  Head pain as outlined.  Has had  extensive w/up.   Has noticed increased sinus congestion or drainage.  Chest tightness started with the increased coughing.  Feels better.  Some chest soreness with increased coughing.   No acid reflux.  No nausea or vomiting.  No abdominal pain or cramping.  No bowel change.  Appears to be doing better with depression and anxiety.  Seeing psychiatry.         Objective:   Physical Exam  Filed Vitals:   03/10/14 1528  BP: 130/80  Pulse: 81  Temp: 98.7 F (43.56 C)   80 year old female in no acute distress.   HEENT:  Nares- slightly erythematous turbinates.  Oropharynx - without lesions.  No significant tenderness to palpation over the sinuses.   NECK:  Supple.  Nontender.  No audible bruit.  HEART:  Appears to be regular. LUNGS:  No crackles or wheezing audible.  Respirations even and unlabored.  Some minimal increased cough with forced expiration.   RADIAL PULSE:  Equal bilaterally.  ABDOMEN:  Soft, nontender.  Bowel sounds present and normal.  No audible abdominal bruit.    EXTREMITIES:  No increased edema present.  DP pulses palpable and equal bilaterally.          Assessment & Plan:  GI.  Colonoscopy 03/05/05 with diverticulosis.  Currently doing well.   CARDIOVASCULAR.  Stress test 12/18/10 revealed no ischemia.  ECHO 12/18/10 revealed EF 60% with mild RV and RA enlargement, mild LVH, mild mitral and tricuspid insufficiency with documented mild pulmonary hypertension.  Saw Dr Ubaldo Glassing.  Felt no further w/up warranted.  Has recently been reevaluated by Dr Manuella Ghazi - cardiology with [facility].  States everything has checked out fine. She feels from a cardiac standpoint things are stable.    KNEE PAIN.  Has seen Dr Latanya Maudlin.    HEALTH MAINTENANCE.  Physical last visit.   Colonoscopy as outlined.  Mammogram 12/08/13 - birads I.     I spent 25 minutes with the patient and more than 50% of the time was spent in consultation regarding the above (including discussion regarding the persistent cough,  congestion and treatment plan).

## 2014-03-15 DIAGNOSIS — F411 Generalized anxiety disorder: Secondary | ICD-10-CM | POA: Diagnosis not present

## 2014-03-15 DIAGNOSIS — I10 Essential (primary) hypertension: Secondary | ICD-10-CM | POA: Diagnosis not present

## 2014-03-15 DIAGNOSIS — H3530 Unspecified macular degeneration: Secondary | ICD-10-CM | POA: Diagnosis not present

## 2014-03-15 DIAGNOSIS — I2510 Atherosclerotic heart disease of native coronary artery without angina pectoris: Secondary | ICD-10-CM | POA: Diagnosis not present

## 2014-03-15 DIAGNOSIS — I509 Heart failure, unspecified: Secondary | ICD-10-CM | POA: Diagnosis not present

## 2014-03-15 DIAGNOSIS — I2789 Other specified pulmonary heart diseases: Secondary | ICD-10-CM | POA: Diagnosis not present

## 2014-03-16 DIAGNOSIS — I509 Heart failure, unspecified: Secondary | ICD-10-CM | POA: Diagnosis not present

## 2014-03-16 DIAGNOSIS — I2510 Atherosclerotic heart disease of native coronary artery without angina pectoris: Secondary | ICD-10-CM | POA: Diagnosis not present

## 2014-03-16 DIAGNOSIS — H3530 Unspecified macular degeneration: Secondary | ICD-10-CM | POA: Diagnosis not present

## 2014-03-16 DIAGNOSIS — I10 Essential (primary) hypertension: Secondary | ICD-10-CM | POA: Diagnosis not present

## 2014-03-16 DIAGNOSIS — I2789 Other specified pulmonary heart diseases: Secondary | ICD-10-CM | POA: Diagnosis not present

## 2014-03-16 DIAGNOSIS — F411 Generalized anxiety disorder: Secondary | ICD-10-CM | POA: Diagnosis not present

## 2014-03-17 DIAGNOSIS — N952 Postmenopausal atrophic vaginitis: Secondary | ICD-10-CM | POA: Insufficient documentation

## 2014-03-17 DIAGNOSIS — N8111 Cystocele, midline: Secondary | ICD-10-CM | POA: Diagnosis not present

## 2014-03-17 DIAGNOSIS — N390 Urinary tract infection, site not specified: Secondary | ICD-10-CM | POA: Diagnosis not present

## 2014-03-17 DIAGNOSIS — N819 Female genital prolapse, unspecified: Secondary | ICD-10-CM | POA: Diagnosis not present

## 2014-03-17 DIAGNOSIS — N816 Rectocele: Secondary | ICD-10-CM | POA: Insufficient documentation

## 2014-03-17 DIAGNOSIS — N318 Other neuromuscular dysfunction of bladder: Secondary | ICD-10-CM | POA: Diagnosis not present

## 2014-03-18 DIAGNOSIS — F411 Generalized anxiety disorder: Secondary | ICD-10-CM | POA: Diagnosis not present

## 2014-03-18 DIAGNOSIS — H3530 Unspecified macular degeneration: Secondary | ICD-10-CM | POA: Diagnosis not present

## 2014-03-18 DIAGNOSIS — I10 Essential (primary) hypertension: Secondary | ICD-10-CM | POA: Diagnosis not present

## 2014-03-18 DIAGNOSIS — I2789 Other specified pulmonary heart diseases: Secondary | ICD-10-CM | POA: Diagnosis not present

## 2014-03-18 DIAGNOSIS — I2510 Atherosclerotic heart disease of native coronary artery without angina pectoris: Secondary | ICD-10-CM | POA: Diagnosis not present

## 2014-03-18 DIAGNOSIS — I509 Heart failure, unspecified: Secondary | ICD-10-CM | POA: Diagnosis not present

## 2014-03-21 DIAGNOSIS — H3530 Unspecified macular degeneration: Secondary | ICD-10-CM | POA: Diagnosis not present

## 2014-03-21 DIAGNOSIS — F411 Generalized anxiety disorder: Secondary | ICD-10-CM | POA: Diagnosis not present

## 2014-03-21 DIAGNOSIS — I509 Heart failure, unspecified: Secondary | ICD-10-CM | POA: Diagnosis not present

## 2014-03-21 DIAGNOSIS — I2510 Atherosclerotic heart disease of native coronary artery without angina pectoris: Secondary | ICD-10-CM | POA: Diagnosis not present

## 2014-03-21 DIAGNOSIS — H4010X0 Unspecified open-angle glaucoma, stage unspecified: Secondary | ICD-10-CM | POA: Diagnosis not present

## 2014-03-21 DIAGNOSIS — I10 Essential (primary) hypertension: Secondary | ICD-10-CM | POA: Diagnosis not present

## 2014-03-21 DIAGNOSIS — I2789 Other specified pulmonary heart diseases: Secondary | ICD-10-CM | POA: Diagnosis not present

## 2014-03-23 DIAGNOSIS — I509 Heart failure, unspecified: Secondary | ICD-10-CM | POA: Diagnosis not present

## 2014-03-23 DIAGNOSIS — I2510 Atherosclerotic heart disease of native coronary artery without angina pectoris: Secondary | ICD-10-CM | POA: Diagnosis not present

## 2014-03-23 DIAGNOSIS — H3530 Unspecified macular degeneration: Secondary | ICD-10-CM | POA: Diagnosis not present

## 2014-03-23 DIAGNOSIS — F411 Generalized anxiety disorder: Secondary | ICD-10-CM | POA: Diagnosis not present

## 2014-03-23 DIAGNOSIS — I2789 Other specified pulmonary heart diseases: Secondary | ICD-10-CM | POA: Diagnosis not present

## 2014-03-23 DIAGNOSIS — I10 Essential (primary) hypertension: Secondary | ICD-10-CM | POA: Diagnosis not present

## 2014-03-25 DIAGNOSIS — I2510 Atherosclerotic heart disease of native coronary artery without angina pectoris: Secondary | ICD-10-CM | POA: Diagnosis not present

## 2014-03-25 DIAGNOSIS — F411 Generalized anxiety disorder: Secondary | ICD-10-CM | POA: Diagnosis not present

## 2014-03-25 DIAGNOSIS — I2789 Other specified pulmonary heart diseases: Secondary | ICD-10-CM | POA: Diagnosis not present

## 2014-03-25 DIAGNOSIS — I10 Essential (primary) hypertension: Secondary | ICD-10-CM | POA: Diagnosis not present

## 2014-03-25 DIAGNOSIS — I509 Heart failure, unspecified: Secondary | ICD-10-CM | POA: Diagnosis not present

## 2014-03-25 DIAGNOSIS — H3530 Unspecified macular degeneration: Secondary | ICD-10-CM | POA: Diagnosis not present

## 2014-03-28 DIAGNOSIS — H3530 Unspecified macular degeneration: Secondary | ICD-10-CM | POA: Diagnosis not present

## 2014-03-28 DIAGNOSIS — I2789 Other specified pulmonary heart diseases: Secondary | ICD-10-CM | POA: Diagnosis not present

## 2014-03-28 DIAGNOSIS — I509 Heart failure, unspecified: Secondary | ICD-10-CM | POA: Diagnosis not present

## 2014-03-28 DIAGNOSIS — I2510 Atherosclerotic heart disease of native coronary artery without angina pectoris: Secondary | ICD-10-CM | POA: Diagnosis not present

## 2014-03-28 DIAGNOSIS — F411 Generalized anxiety disorder: Secondary | ICD-10-CM | POA: Diagnosis not present

## 2014-03-28 DIAGNOSIS — I10 Essential (primary) hypertension: Secondary | ICD-10-CM | POA: Diagnosis not present

## 2014-03-30 DIAGNOSIS — I10 Essential (primary) hypertension: Secondary | ICD-10-CM | POA: Diagnosis not present

## 2014-03-30 DIAGNOSIS — I2510 Atherosclerotic heart disease of native coronary artery without angina pectoris: Secondary | ICD-10-CM | POA: Diagnosis not present

## 2014-03-30 DIAGNOSIS — H3530 Unspecified macular degeneration: Secondary | ICD-10-CM | POA: Diagnosis not present

## 2014-03-30 DIAGNOSIS — F411 Generalized anxiety disorder: Secondary | ICD-10-CM | POA: Diagnosis not present

## 2014-03-30 DIAGNOSIS — L910 Hypertrophic scar: Secondary | ICD-10-CM | POA: Diagnosis not present

## 2014-03-30 DIAGNOSIS — L578 Other skin changes due to chronic exposure to nonionizing radiation: Secondary | ICD-10-CM | POA: Diagnosis not present

## 2014-03-30 DIAGNOSIS — L821 Other seborrheic keratosis: Secondary | ICD-10-CM | POA: Diagnosis not present

## 2014-03-30 DIAGNOSIS — I509 Heart failure, unspecified: Secondary | ICD-10-CM | POA: Diagnosis not present

## 2014-03-30 DIAGNOSIS — I2789 Other specified pulmonary heart diseases: Secondary | ICD-10-CM | POA: Diagnosis not present

## 2014-03-31 DIAGNOSIS — I2789 Other specified pulmonary heart diseases: Secondary | ICD-10-CM | POA: Diagnosis not present

## 2014-03-31 DIAGNOSIS — I10 Essential (primary) hypertension: Secondary | ICD-10-CM | POA: Diagnosis not present

## 2014-03-31 DIAGNOSIS — I2510 Atherosclerotic heart disease of native coronary artery without angina pectoris: Secondary | ICD-10-CM | POA: Diagnosis not present

## 2014-03-31 DIAGNOSIS — F411 Generalized anxiety disorder: Secondary | ICD-10-CM | POA: Diagnosis not present

## 2014-03-31 DIAGNOSIS — H3530 Unspecified macular degeneration: Secondary | ICD-10-CM | POA: Diagnosis not present

## 2014-03-31 DIAGNOSIS — I509 Heart failure, unspecified: Secondary | ICD-10-CM | POA: Diagnosis not present

## 2014-04-01 DIAGNOSIS — I509 Heart failure, unspecified: Secondary | ICD-10-CM | POA: Diagnosis not present

## 2014-04-01 DIAGNOSIS — H3530 Unspecified macular degeneration: Secondary | ICD-10-CM | POA: Diagnosis not present

## 2014-04-01 DIAGNOSIS — I2510 Atherosclerotic heart disease of native coronary artery without angina pectoris: Secondary | ICD-10-CM | POA: Diagnosis not present

## 2014-04-01 DIAGNOSIS — I2789 Other specified pulmonary heart diseases: Secondary | ICD-10-CM | POA: Diagnosis not present

## 2014-04-01 DIAGNOSIS — F411 Generalized anxiety disorder: Secondary | ICD-10-CM | POA: Diagnosis not present

## 2014-04-01 DIAGNOSIS — I10 Essential (primary) hypertension: Secondary | ICD-10-CM | POA: Diagnosis not present

## 2014-04-04 DIAGNOSIS — I10 Essential (primary) hypertension: Secondary | ICD-10-CM | POA: Diagnosis not present

## 2014-04-04 DIAGNOSIS — I2789 Other specified pulmonary heart diseases: Secondary | ICD-10-CM | POA: Diagnosis not present

## 2014-04-04 DIAGNOSIS — F411 Generalized anxiety disorder: Secondary | ICD-10-CM | POA: Diagnosis not present

## 2014-04-04 DIAGNOSIS — I2510 Atherosclerotic heart disease of native coronary artery without angina pectoris: Secondary | ICD-10-CM | POA: Diagnosis not present

## 2014-04-04 DIAGNOSIS — I509 Heart failure, unspecified: Secondary | ICD-10-CM | POA: Diagnosis not present

## 2014-04-04 DIAGNOSIS — H3530 Unspecified macular degeneration: Secondary | ICD-10-CM | POA: Diagnosis not present

## 2014-04-06 DIAGNOSIS — I2510 Atherosclerotic heart disease of native coronary artery without angina pectoris: Secondary | ICD-10-CM | POA: Diagnosis not present

## 2014-04-06 DIAGNOSIS — I509 Heart failure, unspecified: Secondary | ICD-10-CM | POA: Diagnosis not present

## 2014-04-06 DIAGNOSIS — H3530 Unspecified macular degeneration: Secondary | ICD-10-CM | POA: Diagnosis not present

## 2014-04-06 DIAGNOSIS — I2789 Other specified pulmonary heart diseases: Secondary | ICD-10-CM | POA: Diagnosis not present

## 2014-04-06 DIAGNOSIS — F411 Generalized anxiety disorder: Secondary | ICD-10-CM | POA: Diagnosis not present

## 2014-04-06 DIAGNOSIS — I10 Essential (primary) hypertension: Secondary | ICD-10-CM | POA: Diagnosis not present

## 2014-04-08 DIAGNOSIS — I10 Essential (primary) hypertension: Secondary | ICD-10-CM | POA: Diagnosis not present

## 2014-04-08 DIAGNOSIS — I509 Heart failure, unspecified: Secondary | ICD-10-CM | POA: Diagnosis not present

## 2014-04-08 DIAGNOSIS — I2510 Atherosclerotic heart disease of native coronary artery without angina pectoris: Secondary | ICD-10-CM | POA: Diagnosis not present

## 2014-04-08 DIAGNOSIS — H3530 Unspecified macular degeneration: Secondary | ICD-10-CM | POA: Diagnosis not present

## 2014-04-08 DIAGNOSIS — I2789 Other specified pulmonary heart diseases: Secondary | ICD-10-CM | POA: Diagnosis not present

## 2014-04-08 DIAGNOSIS — F411 Generalized anxiety disorder: Secondary | ICD-10-CM | POA: Diagnosis not present

## 2014-04-11 DIAGNOSIS — I10 Essential (primary) hypertension: Secondary | ICD-10-CM | POA: Diagnosis not present

## 2014-04-11 DIAGNOSIS — I2510 Atherosclerotic heart disease of native coronary artery without angina pectoris: Secondary | ICD-10-CM | POA: Diagnosis not present

## 2014-04-11 DIAGNOSIS — I2789 Other specified pulmonary heart diseases: Secondary | ICD-10-CM | POA: Diagnosis not present

## 2014-04-11 DIAGNOSIS — F411 Generalized anxiety disorder: Secondary | ICD-10-CM | POA: Diagnosis not present

## 2014-04-11 DIAGNOSIS — I509 Heart failure, unspecified: Secondary | ICD-10-CM | POA: Diagnosis not present

## 2014-04-11 DIAGNOSIS — H3530 Unspecified macular degeneration: Secondary | ICD-10-CM | POA: Diagnosis not present

## 2014-04-14 DIAGNOSIS — F411 Generalized anxiety disorder: Secondary | ICD-10-CM | POA: Diagnosis not present

## 2014-04-14 DIAGNOSIS — I10 Essential (primary) hypertension: Secondary | ICD-10-CM | POA: Diagnosis not present

## 2014-04-14 DIAGNOSIS — I509 Heart failure, unspecified: Secondary | ICD-10-CM | POA: Diagnosis not present

## 2014-04-14 DIAGNOSIS — H3530 Unspecified macular degeneration: Secondary | ICD-10-CM | POA: Diagnosis not present

## 2014-04-14 DIAGNOSIS — I2789 Other specified pulmonary heart diseases: Secondary | ICD-10-CM | POA: Diagnosis not present

## 2014-04-14 DIAGNOSIS — I2510 Atherosclerotic heart disease of native coronary artery without angina pectoris: Secondary | ICD-10-CM | POA: Diagnosis not present

## 2014-04-15 DIAGNOSIS — F411 Generalized anxiety disorder: Secondary | ICD-10-CM | POA: Diagnosis not present

## 2014-04-15 DIAGNOSIS — I509 Heart failure, unspecified: Secondary | ICD-10-CM | POA: Diagnosis not present

## 2014-04-15 DIAGNOSIS — I2510 Atherosclerotic heart disease of native coronary artery without angina pectoris: Secondary | ICD-10-CM | POA: Diagnosis not present

## 2014-04-15 DIAGNOSIS — I2789 Other specified pulmonary heart diseases: Secondary | ICD-10-CM | POA: Diagnosis not present

## 2014-04-15 DIAGNOSIS — H3530 Unspecified macular degeneration: Secondary | ICD-10-CM | POA: Diagnosis not present

## 2014-04-15 DIAGNOSIS — I10 Essential (primary) hypertension: Secondary | ICD-10-CM | POA: Diagnosis not present

## 2014-04-18 DIAGNOSIS — I2789 Other specified pulmonary heart diseases: Secondary | ICD-10-CM | POA: Diagnosis not present

## 2014-04-18 DIAGNOSIS — I509 Heart failure, unspecified: Secondary | ICD-10-CM | POA: Diagnosis not present

## 2014-04-18 DIAGNOSIS — I2510 Atherosclerotic heart disease of native coronary artery without angina pectoris: Secondary | ICD-10-CM | POA: Diagnosis not present

## 2014-04-18 DIAGNOSIS — F411 Generalized anxiety disorder: Secondary | ICD-10-CM | POA: Diagnosis not present

## 2014-04-18 DIAGNOSIS — I10 Essential (primary) hypertension: Secondary | ICD-10-CM | POA: Diagnosis not present

## 2014-04-18 DIAGNOSIS — H3530 Unspecified macular degeneration: Secondary | ICD-10-CM | POA: Diagnosis not present

## 2014-04-20 DIAGNOSIS — I2510 Atherosclerotic heart disease of native coronary artery without angina pectoris: Secondary | ICD-10-CM | POA: Diagnosis not present

## 2014-04-20 DIAGNOSIS — F411 Generalized anxiety disorder: Secondary | ICD-10-CM | POA: Diagnosis not present

## 2014-04-20 DIAGNOSIS — H3530 Unspecified macular degeneration: Secondary | ICD-10-CM | POA: Diagnosis not present

## 2014-04-20 DIAGNOSIS — I10 Essential (primary) hypertension: Secondary | ICD-10-CM | POA: Diagnosis not present

## 2014-04-20 DIAGNOSIS — I2789 Other specified pulmonary heart diseases: Secondary | ICD-10-CM | POA: Diagnosis not present

## 2014-04-20 DIAGNOSIS — I509 Heart failure, unspecified: Secondary | ICD-10-CM | POA: Diagnosis not present

## 2014-04-22 DIAGNOSIS — I10 Essential (primary) hypertension: Secondary | ICD-10-CM | POA: Diagnosis not present

## 2014-04-22 DIAGNOSIS — I2789 Other specified pulmonary heart diseases: Secondary | ICD-10-CM | POA: Diagnosis not present

## 2014-04-22 DIAGNOSIS — F411 Generalized anxiety disorder: Secondary | ICD-10-CM | POA: Diagnosis not present

## 2014-04-22 DIAGNOSIS — H3530 Unspecified macular degeneration: Secondary | ICD-10-CM | POA: Diagnosis not present

## 2014-04-22 DIAGNOSIS — I2510 Atherosclerotic heart disease of native coronary artery without angina pectoris: Secondary | ICD-10-CM | POA: Diagnosis not present

## 2014-04-22 DIAGNOSIS — I509 Heart failure, unspecified: Secondary | ICD-10-CM | POA: Diagnosis not present

## 2014-04-27 DIAGNOSIS — I509 Heart failure, unspecified: Secondary | ICD-10-CM | POA: Diagnosis not present

## 2014-04-27 DIAGNOSIS — I2789 Other specified pulmonary heart diseases: Secondary | ICD-10-CM | POA: Diagnosis not present

## 2014-04-27 DIAGNOSIS — I10 Essential (primary) hypertension: Secondary | ICD-10-CM | POA: Diagnosis not present

## 2014-04-27 DIAGNOSIS — F411 Generalized anxiety disorder: Secondary | ICD-10-CM | POA: Diagnosis not present

## 2014-04-27 DIAGNOSIS — H3530 Unspecified macular degeneration: Secondary | ICD-10-CM | POA: Diagnosis not present

## 2014-04-27 DIAGNOSIS — I2510 Atherosclerotic heart disease of native coronary artery without angina pectoris: Secondary | ICD-10-CM | POA: Diagnosis not present

## 2014-05-24 DIAGNOSIS — F0280 Dementia in other diseases classified elsewhere without behavioral disturbance: Secondary | ICD-10-CM | POA: Diagnosis not present

## 2014-05-24 DIAGNOSIS — F330 Major depressive disorder, recurrent, mild: Secondary | ICD-10-CM | POA: Diagnosis not present

## 2014-05-30 ENCOUNTER — Encounter: Payer: Self-pay | Admitting: Internal Medicine

## 2014-05-30 ENCOUNTER — Ambulatory Visit (INDEPENDENT_AMBULATORY_CARE_PROVIDER_SITE_OTHER): Payer: Medicare Other | Admitting: Internal Medicine

## 2014-05-30 VITALS — BP 122/70 | HR 84 | Temp 98.4°F | Ht 62.0 in | Wt 155.0 lb

## 2014-05-30 DIAGNOSIS — F3289 Other specified depressive episodes: Secondary | ICD-10-CM

## 2014-05-30 DIAGNOSIS — R51 Headache: Secondary | ICD-10-CM

## 2014-05-30 DIAGNOSIS — I10 Essential (primary) hypertension: Secondary | ICD-10-CM

## 2014-05-30 DIAGNOSIS — F329 Major depressive disorder, single episode, unspecified: Secondary | ICD-10-CM | POA: Diagnosis not present

## 2014-05-30 DIAGNOSIS — I272 Pulmonary hypertension, unspecified: Secondary | ICD-10-CM

## 2014-05-30 DIAGNOSIS — F419 Anxiety disorder, unspecified: Secondary | ICD-10-CM

## 2014-05-30 DIAGNOSIS — R059 Cough, unspecified: Secondary | ICD-10-CM

## 2014-05-30 DIAGNOSIS — G479 Sleep disorder, unspecified: Secondary | ICD-10-CM

## 2014-05-30 DIAGNOSIS — M79609 Pain in unspecified limb: Secondary | ICD-10-CM

## 2014-05-30 DIAGNOSIS — M545 Low back pain, unspecified: Secondary | ICD-10-CM | POA: Diagnosis not present

## 2014-05-30 DIAGNOSIS — F411 Generalized anxiety disorder: Secondary | ICD-10-CM

## 2014-05-30 DIAGNOSIS — R05 Cough: Secondary | ICD-10-CM

## 2014-05-30 DIAGNOSIS — R748 Abnormal levels of other serum enzymes: Secondary | ICD-10-CM | POA: Diagnosis not present

## 2014-05-30 DIAGNOSIS — E7800 Pure hypercholesterolemia, unspecified: Secondary | ICD-10-CM | POA: Diagnosis not present

## 2014-05-30 DIAGNOSIS — M79645 Pain in left finger(s): Secondary | ICD-10-CM

## 2014-05-30 DIAGNOSIS — Z87440 Personal history of urinary (tract) infections: Secondary | ICD-10-CM

## 2014-05-30 DIAGNOSIS — H547 Unspecified visual loss: Secondary | ICD-10-CM

## 2014-05-30 DIAGNOSIS — F32A Depression, unspecified: Secondary | ICD-10-CM

## 2014-05-30 DIAGNOSIS — I2789 Other specified pulmonary heart diseases: Secondary | ICD-10-CM

## 2014-05-30 LAB — CBC WITH DIFFERENTIAL/PLATELET
BASOS PCT: 1.1 % (ref 0.0–3.0)
Basophils Absolute: 0.1 10*3/uL (ref 0.0–0.1)
Eosinophils Absolute: 0.2 10*3/uL (ref 0.0–0.7)
Eosinophils Relative: 2.8 % (ref 0.0–5.0)
HEMATOCRIT: 37.5 % (ref 36.0–46.0)
Hemoglobin: 12.7 g/dL (ref 12.0–15.0)
LYMPHS ABS: 1.8 10*3/uL (ref 0.7–4.0)
Lymphocytes Relative: 30.2 % (ref 12.0–46.0)
MCHC: 33.8 g/dL (ref 30.0–36.0)
MCV: 93.2 fl (ref 78.0–100.0)
MONO ABS: 0.4 10*3/uL (ref 0.1–1.0)
Monocytes Relative: 6.8 % (ref 3.0–12.0)
Neutro Abs: 3.5 10*3/uL (ref 1.4–7.7)
Neutrophils Relative %: 59.1 % (ref 43.0–77.0)
Platelets: 223 10*3/uL (ref 150.0–400.0)
RBC: 4.03 Mil/uL (ref 3.87–5.11)
RDW: 14.6 % (ref 11.5–15.5)
WBC: 6 10*3/uL (ref 4.0–10.5)

## 2014-05-30 LAB — LIPID PANEL
CHOL/HDL RATIO: 2
Cholesterol: 144 mg/dL (ref 0–200)
HDL: 74.4 mg/dL (ref >39.00)
LDL CALC: 49 mg/dL (ref 0–99)
NONHDL: 69.6
TRIGLYCERIDES: 101 mg/dL (ref 0.0–149.0)
VLDL: 20.2 mg/dL (ref 0.0–40.0)

## 2014-05-30 LAB — HEPATIC FUNCTION PANEL
ALT: 16 U/L (ref 0–35)
AST: 19 U/L (ref 0–37)
Albumin: 4 g/dL (ref 3.5–5.2)
Alkaline Phosphatase: 93 U/L (ref 39–117)
BILIRUBIN TOTAL: 0.5 mg/dL (ref 0.2–1.2)
Bilirubin, Direct: 0.1 mg/dL (ref 0.0–0.3)
Total Protein: 6.8 g/dL (ref 6.0–8.3)

## 2014-05-30 LAB — BASIC METABOLIC PANEL
BUN: 26 mg/dL — ABNORMAL HIGH (ref 6–23)
CHLORIDE: 108 meq/L (ref 96–112)
CO2: 25 mEq/L (ref 19–32)
Calcium: 9.7 mg/dL (ref 8.4–10.5)
Creatinine, Ser: 1 mg/dL (ref 0.4–1.2)
GFR: 57.17 mL/min — AB (ref >60.00)
Glucose, Bld: 102 mg/dL — ABNORMAL HIGH (ref 70–99)
Potassium: 4.8 mEq/L (ref 3.5–5.1)
SODIUM: 143 meq/L (ref 135–145)

## 2014-05-30 LAB — TSH: TSH: 1.41 u[IU]/mL (ref 0.35–4.50)

## 2014-05-30 NOTE — Progress Notes (Signed)
Subjective:    Patient ID: [name], female    DOB: 07-Nov-1925, 80 y.o.   MRN: [id number]  HPI 80 year old female with past history of uterine and vulvar cancer, hypertension and hypercholesterolemia who comes in today for a scheduled follow up.  She is accompanied by her daughter.  History obtained from both of them.  Had been having issues with increased depression and increased stress.  Seeing Dr Nicolasa Ducking  On zoloft and trazodone.  Doses have been adjusted.  Doing better. Just evaluated last week.  The previous light headedness and dizziness have resolved.   No head pain reported today.  She has been evaluated by cardiology (Dr Manuella Ghazi at [place] at [facility]).  Had ECHO and holter monitor.  States everything has checked out fine.  She was admitted with acute vision loss.  Still with visual field defect.  Limits her mobility and activity.  No increased cough or congestion.  Breathing better.  Has pain across her shoulders and low back.  Sees Dr Latanya Maudlin.  Is taking 1/2 hydrocodone daily prn.  Daughter states that [name] does not take regularly.  This helps her pain.  She did fall two weeks ago.  Bruise on her leg.  Better.  States "went down easy".  No head injury.  Did injure her left thumb.  Still sore at the base of the thumb.  No increased swelling or bruising.  No wrist pain.       Past Medical History  Diagnosis Date  . Cancer     uterine and questionable vulvar, s/p hysterectomy  . Hypercholesterolemia   . Chronic headaches   . GERD (gastroesophageal reflux disease)   . Glaucoma   . Arthritis   . Right bundle branch block   . Reactive airway disease   . Cystocele   . Rectocele   . Hypertension   . Macular degeneration     Current Outpatient Prescriptions on File Prior to Visit  Medication Sig Dispense Refill  . atorvastatin (LIPITOR) 40 MG tablet Take 1 tablet (40 mg total) by mouth daily at 6 PM.  30 tablet  5  . brinzolamide (AZOPT) 1 % ophthalmic suspension Place 1 drop  into the left eye 3 (three) times daily.  10 mL  12  . fluticasone (FLONASE) 50 MCG/ACT nasal spray Place 2 sprays into both nostrils daily.  16 g  1  . HYDROcodone-acetaminophen (NORCO/VICODIN) 5-325 MG per tablet Take 1 tablet by mouth at bedtime as needed for moderate pain.      [name] latanoprost (XALATAN) 0.005 % ophthalmic solution Place 1 drop into both eyes at bedtime.  2.5 mL  12  . memantine (NAMENDA) 10 MG tablet Take 10 mg by mouth at bedtime.      . Multiple Vitamin (MULTIVITAMIN) tablet Take 1 tablet by mouth daily.      . Multiple Vitamins-Minerals (PRESERVISION AREDS) TABS Take 1 tablet by mouth every morning.      . Omega-3 Fatty Acids (FISH OIL CONCENTRATE PO) Take 1 capsule by mouth 2 (two) times daily. 1750mg       . omeprazole (PRILOSEC) 20 MG capsule Take 1 capsule (20 mg total) by mouth every morning.  30 capsule  5  . sertraline (ZOLOFT) 100 MG tablet Take 100 mg by mouth daily.      . travoprost, benzalkonium, (TRAVATAN) 0.004 % ophthalmic solution Place 1 drop into both eyes at bedtime.      . vitamin D, CHOLECALCIFEROL, 400 UNITS tablet  Take 400 Units by mouth daily.       No current facility-administered medications on file prior to visit.    Review of Systems No light headedness or dizziness.  No head pain reported today.  No significant sinus or allergy symptoms.  Feels better.  No chest pain or tightness.  No sob.  Minimal cough.  No significant congestion.   No acid reflux.  No nausea or vomiting.  No abdominal pain or cramping.  No bowel change.  Appears to be doing better with depression and anxiety.  Seeing psychiatry.  Does report being cold.  No fever.  Persistent low back pain and pain across her shoulders.  See above.       Objective:   Physical Exam  Filed Vitals:   05/30/14 0923  BP: 122/70  Pulse: 84  Temp: 98.4 F (36.9 C)   Blood pressure recheck:  74/43  80 year old female in no acute distress.   HEENT:  Nares- clear.   Oropharynx - without  lesions.   NECK:  Supple.  Nontender.  No audible bruit.  HEART:  Appears to be regular. LUNGS:  No crackles or wheezing audible.  Respirations even and unlabored.  Good breath sounds bilateral.   RADIAL PULSE:  Equal bilaterally.  ABDOMEN:  Soft, nontender.  Bowel sounds present and normal.  No audible abdominal bruit.    EXTREMITIES:  No increased edema present.  DP pulses palpable and equal bilaterally.  MSK:  Pain to palpation at the base of the left thumb.  No increased soft tissue swelling.           Assessment & Plan:  GI.  Colonoscopy 03/05/05 with diverticulosis.  Currently doing well.   CARDIOVASCULAR.  Stress test 12/18/10 revealed no ischemia.  ECHO 12/18/10 revealed EF 60% with mild RV and RA enlargement, mild LVH, mild mitral and tricuspid insufficiency with documented mild pulmonary hypertension.  Saw Dr Ubaldo Glassing.  Felt no further w/up warranted.  Has recently been reevaluated by Dr Manuella Ghazi - cardiology with [place].  States everything has checked out fine. She feels from a cardiac standpoint things are stable.    HEALTH MAINTENANCE.  Physical 01/28/14. .   Colonoscopy as outlined.  Mammogram 12/08/13 - birads I.     I spent 25 minutes with the patient and more than 50% of the time was spent in consultation regarding the above.

## 2014-05-30 NOTE — Progress Notes (Signed)
Pre visit review using our clinic review tool, if applicable. No additional management support is needed unless otherwise documented below in the visit note. 

## 2014-05-31 ENCOUNTER — Other Ambulatory Visit: Payer: Self-pay | Admitting: Internal Medicine

## 2014-05-31 ENCOUNTER — Encounter: Payer: Self-pay | Admitting: Internal Medicine

## 2014-05-31 ENCOUNTER — Encounter: Payer: Self-pay | Admitting: *Deleted

## 2014-05-31 DIAGNOSIS — M79645 Pain in left finger(s): Secondary | ICD-10-CM | POA: Insufficient documentation

## 2014-05-31 DIAGNOSIS — I10 Essential (primary) hypertension: Secondary | ICD-10-CM

## 2014-05-31 NOTE — Assessment & Plan Note (Signed)
Visual field defect - lower 1/2.  Worked up in the hospital.  On aspirin.  Has seen opthalmology.

## 2014-05-31 NOTE — Assessment & Plan Note (Signed)
Much better.  No significant cough now.  Follow.  Breathing stable.

## 2014-05-31 NOTE — Assessment & Plan Note (Signed)
Low cholesterol diet.  Continue current medication regimen.  Follow lipid panel and liver function.

## 2014-05-31 NOTE — Assessment & Plan Note (Signed)
Has previously had MRI.  Unrevealing.  Has seen neurology, ENT and opthalmology.  No clear etiology found.  Previously tried on prednisone for treatment of possible temporal arteritis.  Biopsy negative.  No change previously on high dose prednisone.  Did not report head pain today.

## 2014-05-31 NOTE — Assessment & Plan Note (Signed)
Saw urology.  See their note for details.  On oxytrol now.  Has helped.  Follow.  Keep f/u appt with Dr Sharlette Dense (urology).

## 2014-05-31 NOTE — Assessment & Plan Note (Signed)
Reports pain across her shoulder.  pts daughter reports bone on bone - shoulders.  Seeing Dr Latanya Maudlin.  Takes 1/2 hydrocodone daily prn.  Daughter reports not taking an increased amount.  Does help.  Follow.

## 2014-05-31 NOTE — Assessment & Plan Note (Signed)
Has seen Dr  Ubaldo Glassing and Dr Raul Del.  Had recent ECHO in the hospital.  Follow.

## 2014-05-31 NOTE — Assessment & Plan Note (Signed)
Previous elevated alkaline phosphatase.  Recheck today.

## 2014-05-31 NOTE — Assessment & Plan Note (Signed)
Seeing Dr Nicolasa Ducking.  On zoloft and trazodone.  Follow.  Appears to be better.  Medication doses have been adjusted.  Just evaluated last week.

## 2014-05-31 NOTE — Assessment & Plan Note (Signed)
On zoloft and trazodone.  Seeing Dr Nicolasa Ducking.  Follow.

## 2014-05-31 NOTE — Assessment & Plan Note (Signed)
Blood pressure doing relatively well on no medication.  Follow.

## 2014-05-31 NOTE — Assessment & Plan Note (Signed)
Pain at the base of the left thumb s/p fall.  Increased pain to palpation.  Able to rotate her thumb without much difficulty or pain.  Offered xray.  She declined.  Thumb spica.  Let me know if persistent problem.

## 2014-05-31 NOTE — Progress Notes (Signed)
Order placed for f/u met b.   

## 2014-06-17 DIAGNOSIS — N8111 Cystocele, midline: Secondary | ICD-10-CM | POA: Diagnosis not present

## 2014-06-17 DIAGNOSIS — N3281 Overactive bladder: Secondary | ICD-10-CM | POA: Insufficient documentation

## 2014-06-17 DIAGNOSIS — N3946 Mixed incontinence: Secondary | ICD-10-CM | POA: Diagnosis not present

## 2014-06-17 DIAGNOSIS — N368 Other specified disorders of urethra: Secondary | ICD-10-CM | POA: Diagnosis not present

## 2014-06-17 DIAGNOSIS — N952 Postmenopausal atrophic vaginitis: Secondary | ICD-10-CM | POA: Diagnosis not present

## 2014-06-17 DIAGNOSIS — N318 Other neuromuscular dysfunction of bladder: Secondary | ICD-10-CM | POA: Diagnosis not present

## 2014-06-23 ENCOUNTER — Other Ambulatory Visit: Payer: Self-pay | Admitting: Internal Medicine

## 2014-07-01 DIAGNOSIS — M19049 Primary osteoarthritis, unspecified hand: Secondary | ICD-10-CM | POA: Diagnosis not present

## 2014-07-01 DIAGNOSIS — M19019 Primary osteoarthritis, unspecified shoulder: Secondary | ICD-10-CM | POA: Diagnosis not present

## 2014-07-05 DIAGNOSIS — F0280 Dementia in other diseases classified elsewhere without behavioral disturbance: Secondary | ICD-10-CM | POA: Diagnosis not present

## 2014-07-05 DIAGNOSIS — F330 Major depressive disorder, recurrent, mild: Secondary | ICD-10-CM | POA: Diagnosis not present

## 2014-07-07 ENCOUNTER — Other Ambulatory Visit: Payer: Self-pay | Admitting: Internal Medicine

## 2014-07-07 DIAGNOSIS — Z23 Encounter for immunization: Secondary | ICD-10-CM | POA: Diagnosis not present

## 2014-07-12 ENCOUNTER — Other Ambulatory Visit (INDEPENDENT_AMBULATORY_CARE_PROVIDER_SITE_OTHER): Payer: Medicare Other

## 2014-07-12 DIAGNOSIS — I10 Essential (primary) hypertension: Secondary | ICD-10-CM | POA: Diagnosis not present

## 2014-07-12 LAB — BASIC METABOLIC PANEL
BUN: 16 mg/dL (ref 6–23)
CO2: 24 meq/L (ref 19–32)
Calcium: 9.3 mg/dL (ref 8.4–10.5)
Chloride: 108 mEq/L (ref 96–112)
Creatinine, Ser: 1 mg/dL (ref 0.4–1.2)
GFR: 56.48 mL/min — ABNORMAL LOW (ref >60.00)
Glucose, Bld: 103 mg/dL — ABNORMAL HIGH (ref 70–99)
Potassium: 4.2 mEq/L (ref 3.5–5.1)
SODIUM: 140 meq/L (ref 135–145)

## 2014-07-13 ENCOUNTER — Encounter: Payer: Self-pay | Admitting: *Deleted

## 2014-07-19 DIAGNOSIS — H3531 Nonexudative age-related macular degeneration: Secondary | ICD-10-CM | POA: Diagnosis not present

## 2014-07-29 ENCOUNTER — Other Ambulatory Visit: Payer: Self-pay | Admitting: Internal Medicine

## 2014-08-03 DIAGNOSIS — M19042 Primary osteoarthritis, left hand: Secondary | ICD-10-CM | POA: Diagnosis not present

## 2014-08-03 DIAGNOSIS — M1711 Unilateral primary osteoarthritis, right knee: Secondary | ICD-10-CM | POA: Diagnosis not present

## 2014-09-09 DIAGNOSIS — H3531 Nonexudative age-related macular degeneration: Secondary | ICD-10-CM | POA: Diagnosis not present

## 2014-10-03 ENCOUNTER — Ambulatory Visit: Payer: Medicare Other | Admitting: Internal Medicine

## 2014-10-05 ENCOUNTER — Ambulatory Visit (INDEPENDENT_AMBULATORY_CARE_PROVIDER_SITE_OTHER): Payer: Medicare Other | Admitting: Internal Medicine

## 2014-10-05 ENCOUNTER — Encounter: Payer: Self-pay | Admitting: Internal Medicine

## 2014-10-05 VITALS — BP 120/70 | HR 81 | Temp 98.2°F | Ht 62.0 in | Wt 154.8 lb

## 2014-10-05 DIAGNOSIS — E7800 Pure hypercholesterolemia, unspecified: Secondary | ICD-10-CM

## 2014-10-05 DIAGNOSIS — F419 Anxiety disorder, unspecified: Secondary | ICD-10-CM

## 2014-10-05 DIAGNOSIS — R748 Abnormal levels of other serum enzymes: Secondary | ICD-10-CM

## 2014-10-05 DIAGNOSIS — F329 Major depressive disorder, single episode, unspecified: Secondary | ICD-10-CM | POA: Diagnosis not present

## 2014-10-05 DIAGNOSIS — H547 Unspecified visual loss: Secondary | ICD-10-CM

## 2014-10-05 DIAGNOSIS — M25551 Pain in right hip: Secondary | ICD-10-CM | POA: Diagnosis not present

## 2014-10-05 DIAGNOSIS — F330 Major depressive disorder, recurrent, mild: Secondary | ICD-10-CM | POA: Diagnosis not present

## 2014-10-05 DIAGNOSIS — F32A Depression, unspecified: Secondary | ICD-10-CM

## 2014-10-05 DIAGNOSIS — F0280 Dementia in other diseases classified elsewhere without behavioral disturbance: Secondary | ICD-10-CM | POA: Diagnosis not present

## 2014-10-05 DIAGNOSIS — I10 Essential (primary) hypertension: Secondary | ICD-10-CM | POA: Diagnosis not present

## 2014-10-05 DIAGNOSIS — M25511 Pain in right shoulder: Secondary | ICD-10-CM | POA: Diagnosis not present

## 2014-10-05 DIAGNOSIS — M25559 Pain in unspecified hip: Secondary | ICD-10-CM

## 2014-10-05 NOTE — Progress Notes (Signed)
Pre visit review using our clinic review tool, if applicable. No additional management support is needed unless otherwise documented below in the visit note. 

## 2014-10-09 ENCOUNTER — Encounter: Payer: Self-pay | Admitting: Internal Medicine

## 2014-10-09 NOTE — Progress Notes (Signed)
Subjective:    Patient ID: [name], female    DOB: 1926-03-11, 81 y.o.   MRN: [id number]  HPI 81 year old female with past history of uterine and vulvar cancer, hypertension and hypercholesterolemia who comes in today for a scheduled follow up.  She is accompanied by her husband.  History obtained from both of them.  Has been having issues with increased depression and increased stress.  Seeing Dr Nicolasa Ducking  On zoloft and trazodone.  Doses have been adjusted.  Due to f/u with Dr Nicolasa Ducking today.    The previous light headedness and dizziness have resolved.   No head pain reported today.  She does report neck pain and shoulder pain.  She did have a recent fall.  Fell backwards.  hit her head.  No headache or dizziness since the fall.  She has been evaluated by cardiology (Dr Manuella Ghazi at [facility] at [facility]).  Had ECHO and holter monitor.  States everything has checked out fine.  She previously experienced acute vision loss.  Still with visual field defect.  Limits her mobility and activity.  No increased cough or congestion.  Breathing better.  She also reports increased hip pain.  Was having problems with her hip and gait prior to her fall, but reports some increased pain since the fall.  Sees Dr Latanya Maudlin.  Normally  takes 1/2 hydrocodone daily prn.  We discussed further w/up for her hip and neck/shoulder pain.  She declines.  Does agree to referral back to Dr Latanya Maudlin.  Wants to hold on xray until his review.    Past Medical History  Diagnosis Date  . Cancer     uterine and questionable vulvar, s/p hysterectomy  . Hypercholesterolemia   . Chronic headaches   . GERD (gastroesophageal reflux disease)   . Glaucoma   . Arthritis   . Right bundle branch block   . Reactive airway disease   . Cystocele   . Rectocele   . Hypertension   . Macular degeneration     Current Outpatient Prescriptions on File Prior to Visit  Medication Sig Dispense Refill  . atorvastatin (LIPITOR) 40 MG tablet TAKE ONE  TABLET BY MOUTH DAILY AT 6 P.M. 30 tablet 6  . brinzolamide (AZOPT) 1 % ophthalmic suspension Place 1 drop into the left eye 3 (three) times daily. 10 mL 12  . fluticasone (FLONASE) 50 MCG/ACT nasal spray Place 2 sprays into both nostrils daily. 16 g 1  . HYDROcodone-acetaminophen (NORCO/VICODIN) 5-325 MG per tablet Take 1 tablet by mouth at bedtime as needed for moderate pain.    [name] latanoprost (XALATAN) 0.005 % ophthalmic solution Place 1 drop into both eyes at bedtime. 2.5 mL 12  . memantine (NAMENDA) 10 MG tablet Take 10 mg by mouth at bedtime.    . Multiple Vitamin (MULTIVITAMIN) tablet Take 1 tablet by mouth daily.    . Multiple Vitamins-Minerals (PRESERVISION AREDS) TABS Take 1 tablet by mouth every morning.    . Omega-3 Fatty Acids (FISH OIL CONCENTRATE PO) Take 1 capsule by mouth 2 (two) times daily. 1750mg     . omeprazole (PRILOSEC) 20 MG capsule TAKE ONE CAPSULE EACH MORNING 30 capsule 5  . sertraline (ZOLOFT) 100 MG tablet Take 150 mg by mouth daily.     . travoprost, benzalkonium, (TRAVATAN) 0.004 % ophthalmic solution Place 1 drop into both eyes at bedtime.    . traZODone (DESYREL) 150 MG tablet Take by mouth at bedtime.    . vitamin D, CHOLECALCIFEROL, 400  UNITS tablet Take 400 Units by mouth daily.     No current facility-administered medications on file prior to visit.    Review of Systems No light headedness or dizziness.  No head pain reported today.  No significant sinus or allergy symptoms.  No chest pain or tightness.  No sob.  No cough reported.   No acid reflux.  No nausea or vomiting.  No abdominal pain or cramping.  No bowel change.  Appears to be doing better with depression and anxiety.  Seeing psychiatry.  Does report being cold.  No fever.  Persistent low back pain and pain across her shoulders.  See above.       Objective:   Physical Exam  Filed Vitals:   10/05/14 1131  BP: 120/70  Pulse: 81  Temp: 98.2 F (19.78 C)   81 year old female in no acute  distress.   HEENT:  Nares- clear.   Oropharynx - without lesions.   NECK:  Supple.  Nontender.  No audible bruit.  HEART:  Appears to be regular. LUNGS:  No crackles or wheezing audible.  Respirations even and unlabored.  Good breath sounds bilateral.   RADIAL PULSE:  Equal bilaterally.  ABDOMEN:  Soft, nontender.  Bowel sounds present and normal.  No audible abdominal bruit.    EXTREMITIES:  No increased edema present.  DP pulses palpable and equal bilaterally.  MSK:  Increased pain in her right hip with weight bearing and ambulation.  No significant pain with straight leg raise.  No pain with abduction and adduction at the hip.            Assessment & Plan:  1. Right shoulder pain See above.  Refer back to Dr Latanya Maudlin.   - Ambulatory referral to Orthopedic Surgery  2. Right hip pain S/p recent fall.  See above.  No pain on exam as outlined.  Some pain with weight bearing.  Declines xray here.  Agrees to referral back to Dr Latanya Maudlin.  - Ambulatory referral to Orthopedic Surgery  3. Essential hypertension, benign Blood pressure doing well.  Same medication regimen.  Follow pressures.   - Basic metabolic panel; Future  4. Hypercholesterolemia Low cholesterol diet and exercise.  On lipitor.  Follow lipid panel and liver function tests.   - Lipid panel; Future - Hepatic function panel; Future  5. Depression Seeing Dr Nicolasa Ducking.  Appears to be stable.  Due to see Dr Nicolasa Ducking today.  6. Loss of vision Visual field defect - lower half.  W/up in the hospital.  On aspirin.  Seeing ophthalmology.    7. Anxiety Seeing Dr Nicolasa Ducking.  On zoloft and trazodone.  F/u appt today.    8. Elevated alkaline phosphatase level Alkaline phos level checked 05/30/14 - wnl.    9. Hip pain, unspecified laterality See above.  Refer to Dr Latanya Maudlin as outlined.    10. GI.  Colonoscopy 03/05/05 with diverticulosis.  Currently doing well.   11. CARDIOVASCULAR.  Stress test 12/18/10 revealed no ischemia.  ECHO 12/18/10  revealed EF 60% with mild RV and RA enlargement, mild LVH, mild mitral and tricuspid insufficiency with documented mild pulmonary hypertension.  Saw Dr Ubaldo Glassing.  Felt no further w/up warranted.  Has recently been reevaluated by Dr Manuella Ghazi - cardiology with [facility].  States everything has checked out fine. She feels from a cardiac standpoint things are stable.    HEALTH MAINTENANCE.  Physical 01/28/14. .   Colonoscopy as outlined.  Mammogram 12/08/13 - birads I.  I spent 25 minutes with the patient and more than 50% of the time was spent in consultation regarding the above.

## 2014-10-19 DIAGNOSIS — M7061 Trochanteric bursitis, right hip: Secondary | ICD-10-CM | POA: Diagnosis not present

## 2014-10-19 DIAGNOSIS — M7541 Impingement syndrome of right shoulder: Secondary | ICD-10-CM | POA: Diagnosis not present

## 2014-10-21 ENCOUNTER — Other Ambulatory Visit: Payer: Self-pay | Admitting: Internal Medicine

## 2014-10-21 ENCOUNTER — Ambulatory Visit (INDEPENDENT_AMBULATORY_CARE_PROVIDER_SITE_OTHER): Payer: Medicare Other | Admitting: Internal Medicine

## 2014-10-21 ENCOUNTER — Encounter: Payer: Self-pay | Admitting: Internal Medicine

## 2014-10-21 ENCOUNTER — Other Ambulatory Visit (INDEPENDENT_AMBULATORY_CARE_PROVIDER_SITE_OTHER): Payer: Medicare Other

## 2014-10-21 VITALS — BP 120/60 | HR 75 | Temp 97.8°F | Ht 62.0 in | Wt 161.0 lb

## 2014-10-21 DIAGNOSIS — E7800 Pure hypercholesterolemia, unspecified: Secondary | ICD-10-CM

## 2014-10-21 DIAGNOSIS — J069 Acute upper respiratory infection, unspecified: Secondary | ICD-10-CM | POA: Diagnosis not present

## 2014-10-21 DIAGNOSIS — I10 Essential (primary) hypertension: Secondary | ICD-10-CM

## 2014-10-21 DIAGNOSIS — R739 Hyperglycemia, unspecified: Secondary | ICD-10-CM

## 2014-10-21 LAB — LIPID PANEL
CHOL/HDL RATIO: 2
CHOLESTEROL: 152 mg/dL (ref 0–200)
HDL: 75.7 mg/dL (ref >39.00)
LDL Cholesterol: 62 mg/dL (ref 0–99)
NonHDL: 76.3
Triglycerides: 74 mg/dL (ref 0.0–149.0)
VLDL: 14.8 mg/dL (ref 0.0–40.0)

## 2014-10-21 LAB — BASIC METABOLIC PANEL
BUN: 27 mg/dL — ABNORMAL HIGH (ref 6–23)
CALCIUM: 9.7 mg/dL (ref 8.4–10.5)
CO2: 27 mEq/L (ref 19–32)
CREATININE: 1.1 mg/dL (ref 0.40–1.20)
Chloride: 109 mEq/L (ref 96–112)
GFR: 49.99 mL/min — ABNORMAL LOW (ref >60.00)
Glucose, Bld: 119 mg/dL — ABNORMAL HIGH (ref 70–99)
Potassium: 4.1 mEq/L (ref 3.5–5.1)
SODIUM: 141 meq/L (ref 135–145)

## 2014-10-21 LAB — HEPATIC FUNCTION PANEL
ALBUMIN: 3.8 g/dL (ref 3.5–5.2)
ALK PHOS: 74 U/L (ref 39–117)
ALT: 10 U/L (ref 0–35)
AST: 14 U/L (ref 0–37)
Bilirubin, Direct: 0.1 mg/dL (ref 0.0–0.3)
Total Bilirubin: 0.4 mg/dL (ref 0.2–1.2)
Total Protein: 6.7 g/dL (ref 6.0–8.3)

## 2014-10-21 MED ORDER — CEFDINIR 300 MG PO CAPS: 300.0000 mg | ORAL_CAPSULE | Freq: Two times a day (BID) | ORAL | Status: DC

## 2014-10-21 NOTE — Progress Notes (Signed)
Orders placed for f/u labs.  

## 2014-10-21 NOTE — Patient Instructions (Signed)
Saline nasal spray - flush nose at least 2-3x/day  Mucinex DM in the am and Robitussin in the evening.    Take the antibiotic twice a day

## 2014-10-21 NOTE — Progress Notes (Signed)
Pre visit review using our clinic review tool, if applicable. No additional management support is needed unless otherwise documented below in the visit note. 

## 2014-10-24 ENCOUNTER — Encounter: Payer: Self-pay | Admitting: *Deleted

## 2014-10-24 ENCOUNTER — Encounter: Payer: Self-pay | Admitting: Internal Medicine

## 2014-10-24 DIAGNOSIS — J069 Acute upper respiratory infection, unspecified: Secondary | ICD-10-CM | POA: Insufficient documentation

## 2014-10-24 NOTE — Progress Notes (Signed)
Subjective:    Patient ID: [name], female    DOB: 15-Oct-1925, 81 y.o.   MRN: [id number]  HPI 81 year old female with past history of uterine and vulvar cancer, hypertension and hypercholesterolemia who comes in today as a work in with concerns regarding cough and chest congestion.  States symptom started 3-4 days ago.  Started with sore throat.  Increased chest congestion.  Cough mostly non profuctive.  Some wheezing.  Taking tylenol and robitussin.  No nausea or vomiting.  No sinus pressure.     Past Medical History  Diagnosis Date  . Cancer     uterine and questionable vulvar, s/p hysterectomy  . Hypercholesterolemia   . Chronic headaches   . GERD (gastroesophageal reflux disease)   . Glaucoma   . Arthritis   . Right bundle branch block   . Reactive airway disease   . Cystocele   . Rectocele   . Hypertension   . Macular degeneration     Current Outpatient Prescriptions on File Prior to Visit  Medication Sig Dispense Refill  . atorvastatin (LIPITOR) 40 MG tablet TAKE ONE TABLET BY MOUTH DAILY AT 6 P.M. 30 tablet 6  . brinzolamide (AZOPT) 1 % ophthalmic suspension Place 1 drop into the left eye 3 (three) times daily. 10 mL 12  . fluticasone (FLONASE) 50 MCG/ACT nasal spray Place 2 sprays into both nostrils daily. 16 g 1  . HYDROcodone-acetaminophen (NORCO/VICODIN) 5-325 MG per tablet Take 1 tablet by mouth at bedtime as needed for moderate pain.    [name] latanoprost (XALATAN) 0.005 % ophthalmic solution Place 1 drop into both eyes at bedtime. 2.5 mL 12  . memantine (NAMENDA) 10 MG tablet Take 10 mg by mouth at bedtime.    . Multiple Vitamin (MULTIVITAMIN) tablet Take 1 tablet by mouth daily.    . Multiple Vitamins-Minerals (PRESERVISION AREDS) TABS Take 1 tablet by mouth every morning.    . Omega-3 Fatty Acids (FISH OIL CONCENTRATE PO) Take 1 capsule by mouth 2 (two) times daily. 1750mg     . omeprazole (PRILOSEC) 20 MG capsule TAKE ONE CAPSULE EACH MORNING 30 capsule 5  .  sertraline (ZOLOFT) 100 MG tablet Take 150 mg by mouth daily.     . travoprost, benzalkonium, (TRAVATAN) 0.004 % ophthalmic solution Place 1 drop into both eyes at bedtime.    . traZODone (DESYREL) 150 MG tablet Take by mouth at bedtime.    . vitamin D, CHOLECALCIFEROL, 400 UNITS tablet Take 400 Units by mouth daily.     No current facility-administered medications on file prior to visit.    Review of Systems Patient denies any headache or sinus pressure.  No lightheadedness or dizziness reported.   No chest pain, tightness.  No increased shortness of breath.   Does report some cough and chest congestion.   No nausea or vomiting.   No bowel change, such as diarrhea.         Objective:   Physical Exam Filed Vitals:   10/21/14 1045  BP: 120/60  Pulse: 75  Temp: 97.8 F (45.80 C)   81 year old female in no acute distress.   HEENT:  Nares- clear.  Oropharynx - without lesions. NECK:  Supple.  Nontender.  HEART:  Appears to be regular. LUNGS:  No crackles or wheezing audible.  Respirations even and unlabored.         Assessment & Plan:  1. URI (upper respiratory infection) Symptoms and exam as outlined.  Appears to be  c/w URI.  Treat with omnicef bid x 10 days.  Saline nasal spray and nasacort nasal spray as directed.  mucinex DM in the am and robitussin in the pm.  Rest.  Fluids.  Explained to her if symptoms changed, worsened or did not improve, she was to be reevaluated.

## 2014-11-15 ENCOUNTER — Other Ambulatory Visit (INDEPENDENT_AMBULATORY_CARE_PROVIDER_SITE_OTHER): Payer: Medicare Other

## 2014-11-15 DIAGNOSIS — R7309 Other abnormal glucose: Secondary | ICD-10-CM | POA: Diagnosis not present

## 2014-11-15 DIAGNOSIS — R739 Hyperglycemia, unspecified: Secondary | ICD-10-CM

## 2014-11-15 LAB — HEMOGLOBIN A1C: Hgb A1c MFr Bld: 6.2 % (ref 4.6–6.5)

## 2014-11-16 ENCOUNTER — Encounter: Payer: Self-pay | Admitting: *Deleted

## 2014-11-16 LAB — GLUCOSE, FASTING: Glucose, Fasting: 94 mg/dL (ref 70–99)

## 2014-11-17 DIAGNOSIS — H3531 Nonexudative age-related macular degeneration: Secondary | ICD-10-CM | POA: Diagnosis not present

## 2014-11-18 DIAGNOSIS — H9312 Tinnitus, left ear: Secondary | ICD-10-CM | POA: Diagnosis not present

## 2014-11-18 DIAGNOSIS — H6980 Other specified disorders of Eustachian tube, unspecified ear: Secondary | ICD-10-CM | POA: Diagnosis not present

## 2014-11-18 DIAGNOSIS — H6121 Impacted cerumen, right ear: Secondary | ICD-10-CM | POA: Diagnosis not present

## 2014-11-18 DIAGNOSIS — H903 Sensorineural hearing loss, bilateral: Secondary | ICD-10-CM | POA: Diagnosis not present

## 2014-12-02 ENCOUNTER — Encounter: Payer: Self-pay | Admitting: Internal Medicine

## 2014-12-02 ENCOUNTER — Ambulatory Visit (INDEPENDENT_AMBULATORY_CARE_PROVIDER_SITE_OTHER): Payer: Medicare Other | Admitting: Internal Medicine

## 2014-12-02 ENCOUNTER — Encounter (INDEPENDENT_AMBULATORY_CARE_PROVIDER_SITE_OTHER): Payer: Self-pay

## 2014-12-02 VITALS — BP 112/78 | HR 82 | Temp 98.3°F | Ht 62.0 in | Wt 158.0 lb

## 2014-12-02 DIAGNOSIS — R739 Hyperglycemia, unspecified: Secondary | ICD-10-CM | POA: Diagnosis not present

## 2014-12-02 DIAGNOSIS — F329 Major depressive disorder, single episode, unspecified: Secondary | ICD-10-CM | POA: Diagnosis not present

## 2014-12-02 DIAGNOSIS — M25559 Pain in unspecified hip: Secondary | ICD-10-CM | POA: Diagnosis not present

## 2014-12-02 DIAGNOSIS — I10 Essential (primary) hypertension: Secondary | ICD-10-CM | POA: Diagnosis not present

## 2014-12-02 DIAGNOSIS — R748 Abnormal levels of other serum enzymes: Secondary | ICD-10-CM

## 2014-12-02 DIAGNOSIS — R296 Repeated falls: Secondary | ICD-10-CM

## 2014-12-02 DIAGNOSIS — F419 Anxiety disorder, unspecified: Secondary | ICD-10-CM | POA: Diagnosis not present

## 2014-12-02 DIAGNOSIS — E7800 Pure hypercholesterolemia, unspecified: Secondary | ICD-10-CM

## 2014-12-02 DIAGNOSIS — F32A Depression, unspecified: Secondary | ICD-10-CM

## 2014-12-02 DIAGNOSIS — H547 Unspecified visual loss: Secondary | ICD-10-CM

## 2014-12-02 DIAGNOSIS — Z0000 Encounter for general adult medical examination without abnormal findings: Secondary | ICD-10-CM | POA: Diagnosis not present

## 2014-12-02 NOTE — Progress Notes (Signed)
Pre visit review using our clinic review tool, if applicable. No additional management support is needed unless otherwise documented below in the visit note. 

## 2014-12-04 ENCOUNTER — Encounter: Payer: Self-pay | Admitting: Internal Medicine

## 2014-12-04 DIAGNOSIS — Z0000 Encounter for general adult medical examination without abnormal findings: Secondary | ICD-10-CM | POA: Insufficient documentation

## 2014-12-04 DIAGNOSIS — R739 Hyperglycemia, unspecified: Secondary | ICD-10-CM | POA: Insufficient documentation

## 2014-12-04 DIAGNOSIS — R296 Repeated falls: Secondary | ICD-10-CM | POA: Insufficient documentation

## 2014-12-04 NOTE — Assessment & Plan Note (Signed)
On lipitor.  Low cholesterol diet and exercise.  Follow lipid panel and liver function tests.   

## 2014-12-04 NOTE — Assessment & Plan Note (Signed)
Saw Dr Latanya Maudlin.  S/p injection.  Hip is doing better.  Shoulder better as well.  Follow.

## 2014-12-04 NOTE — Assessment & Plan Note (Signed)
Has a history of frequent falls.  Unsteady.  No burning in her feet.  Will refer to neurology for further evaluation.

## 2014-12-04 NOTE — Progress Notes (Signed)
Patient ID: [name], female   DOB: 06-06-1926, 81 y.o.   MRN: [id number]   Subjective:    Patient ID: [name], female    DOB: [birth year], 81 y.o.   MRN: [id number]  HPI  Patient here for a scheduled follow up.  She is accompanied by her husband.  History obtained from both of them.  Her hip and shoulder are doing better.  Saw Dr Latanya Maudlin.  Is s/p injection.  Breathing stable. Previous respiratory infection.  Better.  Some residual cough.  Has a history of frequent falls.  Unsteady gait.  She does have limitations given her loss of vision.  Her and her husband reports it was going on prior to the change in the vision.  No dizziness.  No light headedness.  No syncope or near syncope.  No headache.  Bowels stable.    Past Medical History  Diagnosis Date  . Cancer     uterine and questionable vulvar, s/p hysterectomy  . Hypercholesterolemia   . Chronic headaches   . GERD (gastroesophageal reflux disease)   . Glaucoma   . Arthritis   . Right bundle branch block   . Reactive airway disease   . Cystocele   . Rectocele   . Hypertension   . Macular degeneration     Current Outpatient Prescriptions on File Prior to Visit  Medication Sig Dispense Refill  . atorvastatin (LIPITOR) 40 MG tablet TAKE ONE TABLET BY MOUTH DAILY AT 6 P.M. 30 tablet 6  . brinzolamide (AZOPT) 1 % ophthalmic suspension Place 1 drop into the left eye 3 (three) times daily. 10 mL 12  . fluticasone (FLONASE) 50 MCG/ACT nasal spray Place 2 sprays into both nostrils daily. 16 g 1  . HYDROcodone-acetaminophen (NORCO/VICODIN) 5-325 MG per tablet Take 1 tablet by mouth at bedtime as needed for moderate pain.    [name] latanoprost (XALATAN) 0.005 % ophthalmic solution Place 1 drop into both eyes at bedtime. 2.5 mL 12  . memantine (NAMENDA) 10 MG tablet Take 10 mg by mouth 2 (two) times daily.     . Multiple Vitamin (MULTIVITAMIN) tablet Take 1 tablet by mouth daily.    . Multiple Vitamins-Minerals (PRESERVISION AREDS) TABS  Take 1 tablet by mouth every morning.    . Omega-3 Fatty Acids (FISH OIL CONCENTRATE PO) Take 1 capsule by mouth 2 (two) times daily. 1721m    . omeprazole (PRILOSEC) 20 MG capsule TAKE ONE CAPSULE EACH MORNING 30 capsule 5  . sertraline (ZOLOFT) 100 MG tablet Take 150 mg by mouth daily.     . travoprost, benzalkonium, (TRAVATAN) 0.004 % ophthalmic solution Place 1 drop into both eyes at bedtime.    . traZODone (DESYREL) 150 MG tablet Take by mouth at bedtime.    . vitamin D, CHOLECALCIFEROL, 400 UNITS tablet Take 400 Units by mouth daily.     No current facility-administered medications on file prior to visit.    Review of Systems  Constitutional: Negative for appetite change and unexpected weight change.  HENT: Negative for congestion and sinus pressure.   Respiratory: Positive for cough (some minimal residual cough.  ). Negative for chest tightness and shortness of breath.   Cardiovascular: Negative for chest pain, palpitations and leg swelling.  Gastrointestinal: Negative for nausea, vomiting, abdominal pain and diarrhea.  Genitourinary: Negative for dysuria and difficulty urinating.  Neurological: Negative for dizziness, light-headedness and headaches.  Psychiatric/Behavioral:       Doing better with her anxiety and depression.  Seeing Dr  Nicolasa Ducking.  Doing well on her current medication regimen.        Objective:    Physical Exam  Constitutional: She appears well-developed and well-nourished. No distress.  HENT:  Nose: Nose normal.  Mouth/Throat: Oropharynx is clear and moist.  Neck: Neck supple. No thyromegaly present.  Cardiovascular: Normal rate and regular rhythm.   Pulmonary/Chest: Breath sounds normal. No respiratory distress. She has no wheezes.  Abdominal: Soft. Bowel sounds are normal. There is no tenderness.  Musculoskeletal: She exhibits no edema or tenderness.  Lymphadenopathy:    She has no cervical adenopathy.  Skin: No rash noted. No erythema.    BP 112/78  mmHg  Pulse 82  Temp(Src) 98.3 F (36.8 C) (Oral)  Ht _0  (1.575 m)  Wt 158 lb (71.668 kg)  BMI 28.89 kg/m2  SpO2 94% Wt Readings from Last 3 Encounters:  12/02/14 158 lb (71.668 kg)  10/21/14 161 lb (73.029 kg)  10/05/14 154 lb 12 oz (70.194 kg)     Lab Results  Component Value Date   WBC 6.0 05/30/2014   HGB 12.7 05/30/2014   HCT 37.5 05/30/2014   PLT 223.0 05/30/2014   GLUCOSE 119* 10/21/2014   CHOL 152 10/21/2014   TRIG 74.0 10/21/2014   HDL 75.70 10/21/2014   LDLCALC 62 10/21/2014   ALT 10 10/21/2014   AST 14 10/21/2014   NA 141 10/21/2014   K 4.1 10/21/2014   CL 109 10/21/2014   CREATININE 1.10 10/21/2014   BUN 27* 10/21/2014   CO2 27 10/21/2014   TSH 1.41 05/30/2014   INR 1.01 10/11/2013   HGBA1C 6.2 11/15/2014       Assessment & Plan:   Problem List Items Addressed This Visit    Anxiety    Doing better.  Seeing Dr Nicolasa Ducking.        Depression    Seeing Dr Nicolasa Ducking.  Her and her husband report she is doing better regarding her depression.  Follow.        Elevated alkaline phosphatase level    Recheck liver panel.        Relevant Orders   Hepatic function panel   Essential hypertension, benign - Primary    Blood pressure has been under good control.  Same medication regimen.  Check metabolic panel.        Relevant Orders   Basic metabolic panel   Frequent falls    Has a history of frequent falls.  Unsteady.  No burning in her feet.  Will refer to neurology for further evaluation.        Relevant Orders   Ambulatory referral to Neurology   Health care maintenance    Physical 01/28/14.  Mammogram 12/08/13 - Birads I.        Hip pain    Saw Dr Latanya Maudlin.  S/p injection.  Hip is doing better.  Shoulder better as well.  Follow.        Hypercholesterolemia    On lipitor.  Low cholesterol diet and exercise.  Follow lipid panel and liver function tests.        Relevant Orders   Lipid panel   Hyperglycemia    Low carb diet.  Check met b and a1c  with next labs.        Relevant Orders   Hemoglobin A1c   Microalbumin / creatinine urine ratio   Loss of vision    Visual field defect - lower 1/2.  On aspirin.  Sees opthalmology.  I spent 25 minutes with the patient and more than 50% of the time was spent in consultation regarding the above.     Einar Pheasant, MD

## 2014-12-04 NOTE — Assessment & Plan Note (Signed)
Low carb diet.  Check met b and a1c with next labs.   

## 2014-12-04 NOTE — Assessment & Plan Note (Signed)
Recheck liver panel.  

## 2014-12-04 NOTE — Assessment & Plan Note (Signed)
Physical 01/28/14.  Mammogram 12/08/13 - Birads I.

## 2014-12-04 NOTE — Assessment & Plan Note (Signed)
Doing better.  Seeing Dr Nicolasa Ducking.

## 2014-12-04 NOTE — Assessment & Plan Note (Signed)
Blood pressure has been under good control.  Same medication regimen.  Check metabolic panel.

## 2014-12-04 NOTE — Assessment & Plan Note (Signed)
Visual field defect - lower 1/2.  On aspirin.  Sees opthalmology.

## 2014-12-04 NOTE — Assessment & Plan Note (Signed)
Seeing Dr Nicolasa Ducking.  Her and her husband report she is doing better regarding her depression.  Follow.

## 2014-12-16 DIAGNOSIS — N8110 Cystocele, unspecified: Secondary | ICD-10-CM | POA: Diagnosis not present

## 2014-12-16 DIAGNOSIS — N368 Other specified disorders of urethra: Secondary | ICD-10-CM | POA: Diagnosis not present

## 2014-12-16 DIAGNOSIS — N3946 Mixed incontinence: Secondary | ICD-10-CM | POA: Diagnosis not present

## 2014-12-16 DIAGNOSIS — N3281 Overactive bladder: Secondary | ICD-10-CM | POA: Diagnosis not present

## 2014-12-16 DIAGNOSIS — N952 Postmenopausal atrophic vaginitis: Secondary | ICD-10-CM | POA: Diagnosis not present

## 2014-12-23 DIAGNOSIS — G44221 Chronic tension-type headache, intractable: Secondary | ICD-10-CM | POA: Diagnosis not present

## 2014-12-23 DIAGNOSIS — Z9181 History of falling: Secondary | ICD-10-CM | POA: Diagnosis not present

## 2014-12-28 DIAGNOSIS — F330 Major depressive disorder, recurrent, mild: Secondary | ICD-10-CM | POA: Diagnosis not present

## 2014-12-28 DIAGNOSIS — F0280 Dementia in other diseases classified elsewhere without behavioral disturbance: Secondary | ICD-10-CM | POA: Diagnosis not present

## 2015-01-10 ENCOUNTER — Other Ambulatory Visit: Payer: Self-pay | Admitting: Internal Medicine

## 2015-01-10 DIAGNOSIS — N3941 Urge incontinence: Secondary | ICD-10-CM | POA: Diagnosis not present

## 2015-01-10 DIAGNOSIS — N816 Rectocele: Secondary | ICD-10-CM | POA: Diagnosis not present

## 2015-01-16 DIAGNOSIS — G44221 Chronic tension-type headache, intractable: Secondary | ICD-10-CM | POA: Diagnosis not present

## 2015-01-16 DIAGNOSIS — G44091 Other trigeminal autonomic cephalgias (TAC), intractable: Secondary | ICD-10-CM | POA: Diagnosis not present

## 2015-01-17 ENCOUNTER — Other Ambulatory Visit: Payer: Self-pay | Admitting: *Deleted

## 2015-01-17 ENCOUNTER — Other Ambulatory Visit: Payer: Self-pay | Admitting: Internal Medicine

## 2015-01-17 MED ORDER — ATORVASTATIN CALCIUM 40 MG PO TABS: 40.0000 mg | ORAL_TABLET | Freq: Every day | ORAL | Status: DC

## 2015-01-23 DIAGNOSIS — G44221 Chronic tension-type headache, intractable: Secondary | ICD-10-CM | POA: Diagnosis not present

## 2015-01-23 DIAGNOSIS — G44091 Other trigeminal autonomic cephalgias (TAC), intractable: Secondary | ICD-10-CM | POA: Diagnosis not present

## 2015-03-01 ENCOUNTER — Other Ambulatory Visit (INDEPENDENT_AMBULATORY_CARE_PROVIDER_SITE_OTHER): Payer: Medicare Other

## 2015-03-01 DIAGNOSIS — I10 Essential (primary) hypertension: Secondary | ICD-10-CM

## 2015-03-01 DIAGNOSIS — R739 Hyperglycemia, unspecified: Secondary | ICD-10-CM | POA: Diagnosis not present

## 2015-03-01 DIAGNOSIS — E7800 Pure hypercholesterolemia, unspecified: Secondary | ICD-10-CM

## 2015-03-01 DIAGNOSIS — R748 Abnormal levels of other serum enzymes: Secondary | ICD-10-CM

## 2015-03-01 LAB — BASIC METABOLIC PANEL
BUN: 11 mg/dL (ref 6–23)
CHLORIDE: 106 meq/L (ref 96–112)
CO2: 29 meq/L (ref 19–32)
CREATININE: 1.02 mg/dL (ref 0.40–1.20)
Calcium: 9.4 mg/dL (ref 8.4–10.5)
GFR: 54.49 mL/min — AB (ref >60.00)
Glucose, Bld: 101 mg/dL — ABNORMAL HIGH (ref 70–99)
Potassium: 4.4 mEq/L (ref 3.5–5.1)
Sodium: 141 mEq/L (ref 135–145)

## 2015-03-01 LAB — HEPATIC FUNCTION PANEL
ALK PHOS: 92 U/L (ref 39–117)
ALT: 14 U/L (ref 0–35)
AST: 15 U/L (ref 0–37)
Albumin: 4.1 g/dL (ref 3.5–5.2)
Bilirubin, Direct: 0.1 mg/dL (ref 0.0–0.3)
Total Bilirubin: 0.4 mg/dL (ref 0.2–1.2)
Total Protein: 6.4 g/dL (ref 6.0–8.3)

## 2015-03-01 LAB — MICROALBUMIN / CREATININE URINE RATIO
Creatinine,U: 183.6 mg/dL
Microalb Creat Ratio: 0.5 mg/g (ref 0.0–30.0)
Microalb, Ur: 1 mg/dL (ref 0.0–1.9)

## 2015-03-01 LAB — LIPID PANEL
Cholesterol: 180 mg/dL (ref 0–200)
HDL: 84.5 mg/dL (ref >39.00)
LDL Cholesterol: 79 mg/dL (ref 0–99)
NonHDL: 95.5
TRIGLYCERIDES: 84 mg/dL (ref 0.0–149.0)
Total CHOL/HDL Ratio: 2
VLDL: 16.8 mg/dL (ref 0.0–40.0)

## 2015-03-01 LAB — HEMOGLOBIN A1C: HEMOGLOBIN A1C: 5.7 % (ref 4.6–6.5)

## 2015-03-03 ENCOUNTER — Ambulatory Visit (INDEPENDENT_AMBULATORY_CARE_PROVIDER_SITE_OTHER): Payer: Medicare Other | Admitting: Internal Medicine

## 2015-03-03 ENCOUNTER — Encounter: Payer: Self-pay | Admitting: Internal Medicine

## 2015-03-03 VITALS — BP 136/72 | HR 64 | Temp 98.1°F | Ht 62.0 in | Wt 153.0 lb

## 2015-03-03 DIAGNOSIS — Z0000 Encounter for general adult medical examination without abnormal findings: Secondary | ICD-10-CM

## 2015-03-03 DIAGNOSIS — E7800 Pure hypercholesterolemia, unspecified: Secondary | ICD-10-CM

## 2015-03-03 DIAGNOSIS — R739 Hyperglycemia, unspecified: Secondary | ICD-10-CM

## 2015-03-03 DIAGNOSIS — H547 Unspecified visual loss: Secondary | ICD-10-CM

## 2015-03-03 DIAGNOSIS — F419 Anxiety disorder, unspecified: Secondary | ICD-10-CM | POA: Diagnosis not present

## 2015-03-03 DIAGNOSIS — R296 Repeated falls: Secondary | ICD-10-CM

## 2015-03-03 DIAGNOSIS — I10 Essential (primary) hypertension: Secondary | ICD-10-CM | POA: Diagnosis not present

## 2015-03-03 DIAGNOSIS — F329 Major depressive disorder, single episode, unspecified: Secondary | ICD-10-CM | POA: Diagnosis not present

## 2015-03-03 DIAGNOSIS — F32A Depression, unspecified: Secondary | ICD-10-CM

## 2015-03-03 NOTE — Progress Notes (Signed)
Patient ID: [name], female   DOB: 1926/06/01, 81 y.o.   MRN: [id number]   Subjective:    Patient ID: [name], female    DOB: 05-09-1926, 81 y.o.   MRN: [id number]  HPI  Patient here for a scheduled follow up.  Saw urology.  Pessary did not work.  Discussed using oxytrol patch.  She has lost weight.  She is eating regular meals.  No nausea or vomiting.  Her husband reports she is drinking less soft drinks.  Breathing stable.  Some congestion.  Bowels stable.  She does report being unsteady with walking.  Unsteady gait and feels her legs are not strong.  Discussed that I feel she would benefit from therapy.  Her eyesight limits her.     Past Medical History  Diagnosis Date  . Cancer     uterine and questionable vulvar, s/p hysterectomy  . Hypercholesterolemia   . Chronic headaches   . GERD (gastroesophageal reflux disease)   . Glaucoma   . Arthritis   . Right bundle branch block   . Reactive airway disease   . Cystocele   . Rectocele   . Hypertension   . Macular degeneration     Current Outpatient Prescriptions on File Prior to Visit  Medication Sig Dispense Refill  . atorvastatin (LIPITOR) 40 MG tablet Take 1 tablet (40 mg total) by mouth daily at 6 PM. 30 tablet 5  . brinzolamide (AZOPT) 1 % ophthalmic suspension Place 1 drop into the left eye 3 (three) times daily. 10 mL 12  . fluticasone (FLONASE) 50 MCG/ACT nasal spray Place 2 sprays into both nostrils daily. 16 g 1  . HYDROcodone-acetaminophen (NORCO/VICODIN) 5-325 MG per tablet Take 1 tablet by mouth at bedtime as needed for moderate pain.    [name] latanoprost (XALATAN) 0.005 % ophthalmic solution Place 1 drop into both eyes at bedtime. 2.5 mL 12  . memantine (NAMENDA) 10 MG tablet Take 10 mg by mouth 2 (two) times daily.     . Multiple Vitamin (MULTIVITAMIN) tablet Take 1 tablet by mouth daily.    . Multiple Vitamins-Minerals (PRESERVISION AREDS) TABS Take 1 tablet by mouth every morning.    . Omega-3 Fatty Acids  (FISH OIL CONCENTRATE PO) Take 1 capsule by mouth 2 (two) times daily. 1750mg     . omeprazole (PRILOSEC) 20 MG capsule TAKE ONE CAPSULE EACH MORNING 30 capsule 6  . sertraline (ZOLOFT) 100 MG tablet Take 150 mg by mouth daily.     . travoprost, benzalkonium, (TRAVATAN) 0.004 % ophthalmic solution Place 1 drop into both eyes at bedtime.    . traZODone (DESYREL) 150 MG tablet Take by mouth at bedtime.    . vitamin D, CHOLECALCIFEROL, 400 UNITS tablet Take 400 Units by mouth daily.     No current facility-administered medications on file prior to visit.    Review of Systems  Constitutional: Negative for appetite change and unexpected weight change.  HENT: Positive for congestion and sinus pressure.   Respiratory: Negative for cough, chest tightness and shortness of breath.   Cardiovascular: Negative for chest pain, palpitations and leg swelling.  Gastrointestinal: Negative for nausea, vomiting, abdominal pain and diarrhea.  Genitourinary: Negative for dysuria (none reported. ) and difficulty urinating.  Skin: Negative for color change and rash.  Neurological: Negative for dizziness, light-headedness and headaches.  Psychiatric/Behavioral: Negative for dysphoric mood and agitation.       Seeing Dr Nicolasa Ducking.       Objective:    Physical  Exam  Constitutional: She appears well-developed and well-nourished. No distress.  HENT:  Nose: Nose normal.  Mouth/Throat: Oropharynx is clear and moist.  Neck: Neck supple. No thyromegaly present.  Cardiovascular: Normal rate and regular rhythm.   Pulmonary/Chest: Breath sounds normal. No respiratory distress. She has no wheezes.  Abdominal: Soft. Bowel sounds are normal. There is no tenderness.  Musculoskeletal: She exhibits no edema or tenderness.  Lymphadenopathy:    She has no cervical adenopathy.  Skin: No rash noted. No erythema.  Psychiatric: She has a normal mood and affect. Her behavior is normal.    BP 136/72 mmHg  Pulse 64  Temp(Src)  98.1 F (36.7 C) (Oral)  Ht 5\' 2"  (1.575 m)  Wt 153 lb (69.4 kg)  BMI 27.98 kg/m2  SpO2 96% Wt Readings from Last 3 Encounters:  03/03/15 153 lb (69.4 kg)  12/02/14 158 lb (71.668 kg)  10/21/14 161 lb (73.029 kg)     Lab Results  Component Value Date   WBC 6.0 05/30/2014   HGB 12.7 05/30/2014   HCT 37.5 05/30/2014   PLT 223.0 05/30/2014   GLUCOSE 101* 03/01/2015   CHOL 180 03/01/2015   TRIG 84.0 03/01/2015   HDL 84.50 03/01/2015   LDLCALC 79 03/01/2015   ALT 14 03/01/2015   AST 15 03/01/2015   NA 141 03/01/2015   K 4.4 03/01/2015   CL 106 03/01/2015   CREATININE 1.02 03/01/2015   BUN 11 03/01/2015   CO2 29 03/01/2015   TSH 1.41 05/30/2014   INR 1.01 10/11/2013   HGBA1C 5.7 03/01/2015   MICROALBUR 1.0 03/01/2015       Assessment & Plan:   Problem List Items Addressed This Visit    Anxiety    Seeing Dr Nicolasa Ducking.  Doing better.  Same medication regimen.       Depression    Followed by Dr Nicolasa Ducking.  Continue current medication regimen.  Appears to be doing better.        Relevant Orders   CBC with Differential/Platelet   Essential hypertension, benign - Primary    Blood pressure has been under good control.  Follow pressures.  Continue current medication regimen.        Relevant Orders   TSH   Basic metabolic panel   Frequent falls    Has had falls.  Unsteady gait.  Feels strength decreased in legs.  Use the walker to ambulate.  Arrange home health physical therapy for evaluation and treatment to work on strengthening and balance/gait.       Health care maintenance    Schedule a physical next visit.  Schedule mammogram.        Hypercholesterolemia    Low cholesterol diet and exercise.  Follow lipid panel.        Relevant Orders   Lipid panel   Hepatic function panel   Hyperglycemia    Low carb diet and exercise.   Lab Results  Component Value Date   HGBA1C 5.7 03/01/2015        Relevant Orders   Hemoglobin A1c   Loss of vision    Followed by  opthalmology.         I spent 25 minutes with the patient and more than 50% of the time was spent in consultation regarding the above.     Einar Pheasant, MD

## 2015-03-03 NOTE — Progress Notes (Signed)
Pre visit review using our clinic review tool, if applicable. No additional management support is needed unless otherwise documented below in the visit note. 

## 2015-03-04 ENCOUNTER — Encounter: Payer: Self-pay | Admitting: Internal Medicine

## 2015-03-04 NOTE — Assessment & Plan Note (Signed)
Low carb diet and exercise.   Lab Results  Component Value Date   HGBA1C 5.7 03/01/2015

## 2015-03-04 NOTE — Assessment & Plan Note (Signed)
Schedule a physical next visit.  Schedule mammogram.

## 2015-03-04 NOTE — Assessment & Plan Note (Signed)
Seeing Dr Nicolasa Ducking.  Doing better.  Same medication regimen.

## 2015-03-04 NOTE — Assessment & Plan Note (Signed)
Followed by opthalmology.       

## 2015-03-04 NOTE — Assessment & Plan Note (Signed)
Has had falls.  Unsteady gait.  Feels strength decreased in legs.  Use the walker to ambulate.  Arrange home health physical therapy for evaluation and treatment to work on strengthening and balance/gait.

## 2015-03-04 NOTE — Addendum Note (Signed)
Addended by: Alisa Graff on: 03/04/2015 08:15 PM   Modules accepted: Orders

## 2015-03-04 NOTE — Assessment & Plan Note (Signed)
Low cholesterol diet and exercise.  Follow lipid panel.   

## 2015-03-04 NOTE — Assessment & Plan Note (Signed)
Followed by Dr Nicolasa Ducking.  Continue current medication regimen.  Appears to be doing better.

## 2015-03-04 NOTE — Assessment & Plan Note (Signed)
Blood pressure has been under good control.  Follow pressures.  Continue current medication regimen.

## 2015-03-17 DIAGNOSIS — H4011X3 Primary open-angle glaucoma, severe stage: Secondary | ICD-10-CM | POA: Diagnosis not present

## 2015-03-18 DIAGNOSIS — M1990 Unspecified osteoarthritis, unspecified site: Secondary | ICD-10-CM | POA: Diagnosis not present

## 2015-03-18 DIAGNOSIS — F419 Anxiety disorder, unspecified: Secondary | ICD-10-CM | POA: Diagnosis not present

## 2015-03-18 DIAGNOSIS — H3530 Unspecified macular degeneration: Secondary | ICD-10-CM | POA: Diagnosis not present

## 2015-03-18 DIAGNOSIS — R42 Dizziness and giddiness: Secondary | ICD-10-CM | POA: Diagnosis not present

## 2015-03-18 DIAGNOSIS — H409 Unspecified glaucoma: Secondary | ICD-10-CM | POA: Diagnosis not present

## 2015-03-18 DIAGNOSIS — R296 Repeated falls: Secondary | ICD-10-CM | POA: Diagnosis not present

## 2015-03-18 DIAGNOSIS — I10 Essential (primary) hypertension: Secondary | ICD-10-CM | POA: Diagnosis not present

## 2015-03-18 DIAGNOSIS — H5411 Blindness, right eye, low vision left eye: Secondary | ICD-10-CM | POA: Diagnosis not present

## 2015-03-18 DIAGNOSIS — F329 Major depressive disorder, single episode, unspecified: Secondary | ICD-10-CM | POA: Diagnosis not present

## 2015-03-18 DIAGNOSIS — Z9181 History of falling: Secondary | ICD-10-CM | POA: Diagnosis not present

## 2015-03-18 DIAGNOSIS — Z8542 Personal history of malignant neoplasm of other parts of uterus: Secondary | ICD-10-CM | POA: Diagnosis not present

## 2015-03-18 DIAGNOSIS — R413 Other amnesia: Secondary | ICD-10-CM | POA: Diagnosis not present

## 2015-03-18 DIAGNOSIS — I69398 Other sequelae of cerebral infarction: Secondary | ICD-10-CM | POA: Diagnosis not present

## 2015-03-21 DIAGNOSIS — I10 Essential (primary) hypertension: Secondary | ICD-10-CM | POA: Diagnosis not present

## 2015-03-21 DIAGNOSIS — F330 Major depressive disorder, recurrent, mild: Secondary | ICD-10-CM | POA: Diagnosis not present

## 2015-03-21 DIAGNOSIS — H3530 Unspecified macular degeneration: Secondary | ICD-10-CM | POA: Diagnosis not present

## 2015-03-21 DIAGNOSIS — H409 Unspecified glaucoma: Secondary | ICD-10-CM | POA: Diagnosis not present

## 2015-03-21 DIAGNOSIS — I69398 Other sequelae of cerebral infarction: Secondary | ICD-10-CM | POA: Diagnosis not present

## 2015-03-21 DIAGNOSIS — F0280 Dementia in other diseases classified elsewhere without behavioral disturbance: Secondary | ICD-10-CM | POA: Diagnosis not present

## 2015-03-21 DIAGNOSIS — H5411 Blindness, right eye, low vision left eye: Secondary | ICD-10-CM | POA: Diagnosis not present

## 2015-03-21 DIAGNOSIS — M1990 Unspecified osteoarthritis, unspecified site: Secondary | ICD-10-CM | POA: Diagnosis not present

## 2015-03-23 DIAGNOSIS — H409 Unspecified glaucoma: Secondary | ICD-10-CM | POA: Diagnosis not present

## 2015-03-23 DIAGNOSIS — H3530 Unspecified macular degeneration: Secondary | ICD-10-CM | POA: Diagnosis not present

## 2015-03-23 DIAGNOSIS — M1990 Unspecified osteoarthritis, unspecified site: Secondary | ICD-10-CM | POA: Diagnosis not present

## 2015-03-23 DIAGNOSIS — H5411 Blindness, right eye, low vision left eye: Secondary | ICD-10-CM | POA: Diagnosis not present

## 2015-03-23 DIAGNOSIS — I10 Essential (primary) hypertension: Secondary | ICD-10-CM | POA: Diagnosis not present

## 2015-03-23 DIAGNOSIS — I69398 Other sequelae of cerebral infarction: Secondary | ICD-10-CM | POA: Diagnosis not present

## 2015-03-24 DIAGNOSIS — I10 Essential (primary) hypertension: Secondary | ICD-10-CM | POA: Diagnosis not present

## 2015-03-24 DIAGNOSIS — H409 Unspecified glaucoma: Secondary | ICD-10-CM | POA: Diagnosis not present

## 2015-03-24 DIAGNOSIS — H3530 Unspecified macular degeneration: Secondary | ICD-10-CM | POA: Diagnosis not present

## 2015-03-24 DIAGNOSIS — H3531 Nonexudative age-related macular degeneration: Secondary | ICD-10-CM | POA: Diagnosis not present

## 2015-03-24 DIAGNOSIS — I69398 Other sequelae of cerebral infarction: Secondary | ICD-10-CM | POA: Diagnosis not present

## 2015-03-24 DIAGNOSIS — H5411 Blindness, right eye, low vision left eye: Secondary | ICD-10-CM | POA: Diagnosis not present

## 2015-03-24 DIAGNOSIS — M1990 Unspecified osteoarthritis, unspecified site: Secondary | ICD-10-CM | POA: Diagnosis not present

## 2015-03-27 DIAGNOSIS — M1990 Unspecified osteoarthritis, unspecified site: Secondary | ICD-10-CM | POA: Diagnosis not present

## 2015-03-27 DIAGNOSIS — H5411 Blindness, right eye, low vision left eye: Secondary | ICD-10-CM | POA: Diagnosis not present

## 2015-03-27 DIAGNOSIS — I10 Essential (primary) hypertension: Secondary | ICD-10-CM | POA: Diagnosis not present

## 2015-03-27 DIAGNOSIS — I69398 Other sequelae of cerebral infarction: Secondary | ICD-10-CM | POA: Diagnosis not present

## 2015-03-27 DIAGNOSIS — H409 Unspecified glaucoma: Secondary | ICD-10-CM | POA: Diagnosis not present

## 2015-03-27 DIAGNOSIS — H3530 Unspecified macular degeneration: Secondary | ICD-10-CM | POA: Diagnosis not present

## 2015-03-29 DIAGNOSIS — H409 Unspecified glaucoma: Secondary | ICD-10-CM | POA: Diagnosis not present

## 2015-03-29 DIAGNOSIS — I69398 Other sequelae of cerebral infarction: Secondary | ICD-10-CM | POA: Diagnosis not present

## 2015-03-29 DIAGNOSIS — H5411 Blindness, right eye, low vision left eye: Secondary | ICD-10-CM | POA: Diagnosis not present

## 2015-03-29 DIAGNOSIS — M1990 Unspecified osteoarthritis, unspecified site: Secondary | ICD-10-CM | POA: Diagnosis not present

## 2015-03-29 DIAGNOSIS — H3530 Unspecified macular degeneration: Secondary | ICD-10-CM | POA: Diagnosis not present

## 2015-03-29 DIAGNOSIS — I10 Essential (primary) hypertension: Secondary | ICD-10-CM | POA: Diagnosis not present

## 2015-04-03 DIAGNOSIS — I69398 Other sequelae of cerebral infarction: Secondary | ICD-10-CM | POA: Diagnosis not present

## 2015-04-03 DIAGNOSIS — H409 Unspecified glaucoma: Secondary | ICD-10-CM | POA: Diagnosis not present

## 2015-04-03 DIAGNOSIS — H5411 Blindness, right eye, low vision left eye: Secondary | ICD-10-CM | POA: Diagnosis not present

## 2015-04-03 DIAGNOSIS — M1990 Unspecified osteoarthritis, unspecified site: Secondary | ICD-10-CM | POA: Diagnosis not present

## 2015-04-03 DIAGNOSIS — I10 Essential (primary) hypertension: Secondary | ICD-10-CM | POA: Diagnosis not present

## 2015-04-03 DIAGNOSIS — H3530 Unspecified macular degeneration: Secondary | ICD-10-CM | POA: Diagnosis not present

## 2015-04-05 DIAGNOSIS — I69398 Other sequelae of cerebral infarction: Secondary | ICD-10-CM | POA: Diagnosis not present

## 2015-04-05 DIAGNOSIS — H5411 Blindness, right eye, low vision left eye: Secondary | ICD-10-CM | POA: Diagnosis not present

## 2015-04-05 DIAGNOSIS — H3530 Unspecified macular degeneration: Secondary | ICD-10-CM | POA: Diagnosis not present

## 2015-04-05 DIAGNOSIS — I10 Essential (primary) hypertension: Secondary | ICD-10-CM | POA: Diagnosis not present

## 2015-04-05 DIAGNOSIS — M1990 Unspecified osteoarthritis, unspecified site: Secondary | ICD-10-CM | POA: Diagnosis not present

## 2015-04-05 DIAGNOSIS — H409 Unspecified glaucoma: Secondary | ICD-10-CM | POA: Diagnosis not present

## 2015-04-11 DIAGNOSIS — M1990 Unspecified osteoarthritis, unspecified site: Secondary | ICD-10-CM | POA: Diagnosis not present

## 2015-04-11 DIAGNOSIS — H3530 Unspecified macular degeneration: Secondary | ICD-10-CM | POA: Diagnosis not present

## 2015-04-11 DIAGNOSIS — I69398 Other sequelae of cerebral infarction: Secondary | ICD-10-CM | POA: Diagnosis not present

## 2015-04-11 DIAGNOSIS — H409 Unspecified glaucoma: Secondary | ICD-10-CM | POA: Diagnosis not present

## 2015-04-11 DIAGNOSIS — H5411 Blindness, right eye, low vision left eye: Secondary | ICD-10-CM | POA: Diagnosis not present

## 2015-04-11 DIAGNOSIS — I10 Essential (primary) hypertension: Secondary | ICD-10-CM | POA: Diagnosis not present

## 2015-04-13 DIAGNOSIS — I69398 Other sequelae of cerebral infarction: Secondary | ICD-10-CM | POA: Diagnosis not present

## 2015-04-13 DIAGNOSIS — H5411 Blindness, right eye, low vision left eye: Secondary | ICD-10-CM | POA: Diagnosis not present

## 2015-04-13 DIAGNOSIS — H409 Unspecified glaucoma: Secondary | ICD-10-CM | POA: Diagnosis not present

## 2015-04-13 DIAGNOSIS — H3530 Unspecified macular degeneration: Secondary | ICD-10-CM | POA: Diagnosis not present

## 2015-04-13 DIAGNOSIS — I10 Essential (primary) hypertension: Secondary | ICD-10-CM | POA: Diagnosis not present

## 2015-04-13 DIAGNOSIS — M1990 Unspecified osteoarthritis, unspecified site: Secondary | ICD-10-CM | POA: Diagnosis not present

## 2015-04-14 DIAGNOSIS — R51 Headache: Secondary | ICD-10-CM | POA: Diagnosis not present

## 2015-04-18 DIAGNOSIS — I10 Essential (primary) hypertension: Secondary | ICD-10-CM | POA: Diagnosis not present

## 2015-04-18 DIAGNOSIS — M9902 Segmental and somatic dysfunction of thoracic region: Secondary | ICD-10-CM | POA: Diagnosis not present

## 2015-04-18 DIAGNOSIS — M9901 Segmental and somatic dysfunction of cervical region: Secondary | ICD-10-CM | POA: Diagnosis not present

## 2015-04-18 DIAGNOSIS — M530 Cervicocranial syndrome: Secondary | ICD-10-CM | POA: Diagnosis not present

## 2015-04-18 DIAGNOSIS — M1990 Unspecified osteoarthritis, unspecified site: Secondary | ICD-10-CM | POA: Diagnosis not present

## 2015-04-18 DIAGNOSIS — I69398 Other sequelae of cerebral infarction: Secondary | ICD-10-CM | POA: Diagnosis not present

## 2015-04-18 DIAGNOSIS — H3530 Unspecified macular degeneration: Secondary | ICD-10-CM | POA: Diagnosis not present

## 2015-04-18 DIAGNOSIS — H5411 Blindness, right eye, low vision left eye: Secondary | ICD-10-CM | POA: Diagnosis not present

## 2015-04-18 DIAGNOSIS — M546 Pain in thoracic spine: Secondary | ICD-10-CM | POA: Diagnosis not present

## 2015-04-18 DIAGNOSIS — H409 Unspecified glaucoma: Secondary | ICD-10-CM | POA: Diagnosis not present

## 2015-04-19 DIAGNOSIS — I69398 Other sequelae of cerebral infarction: Secondary | ICD-10-CM | POA: Diagnosis not present

## 2015-04-19 DIAGNOSIS — H5411 Blindness, right eye, low vision left eye: Secondary | ICD-10-CM | POA: Diagnosis not present

## 2015-04-19 DIAGNOSIS — M1990 Unspecified osteoarthritis, unspecified site: Secondary | ICD-10-CM | POA: Diagnosis not present

## 2015-04-19 DIAGNOSIS — H3530 Unspecified macular degeneration: Secondary | ICD-10-CM | POA: Diagnosis not present

## 2015-04-19 DIAGNOSIS — H409 Unspecified glaucoma: Secondary | ICD-10-CM | POA: Diagnosis not present

## 2015-04-19 DIAGNOSIS — I10 Essential (primary) hypertension: Secondary | ICD-10-CM | POA: Diagnosis not present

## 2015-04-21 DIAGNOSIS — M9902 Segmental and somatic dysfunction of thoracic region: Secondary | ICD-10-CM | POA: Diagnosis not present

## 2015-04-21 DIAGNOSIS — M530 Cervicocranial syndrome: Secondary | ICD-10-CM | POA: Diagnosis not present

## 2015-04-21 DIAGNOSIS — M9901 Segmental and somatic dysfunction of cervical region: Secondary | ICD-10-CM | POA: Diagnosis not present

## 2015-04-21 DIAGNOSIS — M546 Pain in thoracic spine: Secondary | ICD-10-CM | POA: Diagnosis not present

## 2015-04-22 ENCOUNTER — Encounter: Payer: Self-pay | Admitting: Emergency Medicine

## 2015-04-22 ENCOUNTER — Emergency Department: Payer: Medicare Other

## 2015-04-22 ENCOUNTER — Emergency Department
Admission: EM | Admit: 2015-04-22 | Discharge: 2015-04-22 | Disposition: A | Discharge status: Home / Self Care | Payer: Medicare Other | Attending: Emergency Medicine | Admitting: Emergency Medicine

## 2015-04-22 DIAGNOSIS — Z7951 Long term (current) use of inhaled steroids: Secondary | ICD-10-CM | POA: Insufficient documentation

## 2015-04-22 DIAGNOSIS — Z79899 Other long term (current) drug therapy: Secondary | ICD-10-CM | POA: Diagnosis not present

## 2015-04-22 DIAGNOSIS — I10 Essential (primary) hypertension: Secondary | ICD-10-CM | POA: Insufficient documentation

## 2015-04-22 DIAGNOSIS — J0130 Acute sphenoidal sinusitis, unspecified: Secondary | ICD-10-CM | POA: Diagnosis not present

## 2015-04-22 DIAGNOSIS — R51 Headache: Secondary | ICD-10-CM | POA: Diagnosis not present

## 2015-04-22 LAB — COMPREHENSIVE METABOLIC PANEL
ALBUMIN: 3.8 g/dL (ref 3.5–5.0)
ALT: 15 U/L (ref 14–54)
ANION GAP: 10 (ref 5–15)
AST: 19 U/L (ref 15–41)
Alkaline Phosphatase: 89 U/L (ref 38–126)
BUN: 17 mg/dL (ref 6–20)
CALCIUM: 9.3 mg/dL (ref 8.9–10.3)
CO2: 22 mmol/L (ref 22–32)
CREATININE: 0.92 mg/dL (ref 0.44–1.00)
Chloride: 108 mmol/L (ref 101–111)
GFR calc Af Amer: >60 mL/min (ref >60)
GFR calc non Af Amer: 54 mL/min — ABNORMAL LOW (ref >60)
Glucose, Bld: 100 mg/dL — ABNORMAL HIGH (ref 65–99)
Potassium: 3.9 mmol/L (ref 3.5–5.1)
Sodium: 140 mmol/L (ref 135–145)
TOTAL PROTEIN: 6.7 g/dL (ref 6.5–8.1)
Total Bilirubin: 0.7 mg/dL (ref 0.3–1.2)

## 2015-04-22 LAB — CBC
HCT: 37.7 % (ref 35.0–47.0)
Hemoglobin: 12.7 g/dL (ref 12.0–16.0)
MCH: 32.3 pg (ref 26.0–34.0)
MCHC: 33.6 g/dL (ref 32.0–36.0)
MCV: 96 fL (ref 80.0–100.0)
PLATELETS: 203 10*3/uL (ref 150–440)
RBC: 3.92 MIL/uL (ref 3.80–5.20)
RDW: 13.4 % (ref 11.5–14.5)
WBC: 9.8 10*3/uL (ref 3.6–11.0)

## 2015-04-22 LAB — SEDIMENTATION RATE: Sed Rate: 31 mm/hr — ABNORMAL HIGH (ref 0–30)

## 2015-04-22 MED ORDER — SODIUM CHLORIDE 0.9 % IV SOLN
Freq: Once | INTRAVENOUS | Status: AC
  Administered 2015-04-22: 14:00:00 via INTRAVENOUS
  Filled 2015-04-22: qty 4

## 2015-04-22 MED ORDER — ONDANSETRON HCL 4 MG/2ML IJ SOLN
4.0000 mg | Freq: Once | INTRAMUSCULAR | Status: AC
  Administered 2015-04-22: 4 mg via INTRAVENOUS

## 2015-04-22 MED ORDER — METOCLOPRAMIDE HCL 5 MG/ML IJ SOLN: 20.0000 mg | Freq: Once | INTRAVENOUS | Status: DC

## 2015-04-22 MED ORDER — HYDROCODONE-ACETAMINOPHEN 5-325 MG PO TABS: 1.0000 | ORAL_TABLET | ORAL | Status: DC | PRN

## 2015-04-22 MED ORDER — TETRACAINE HCL 0.5 % OP SOLN
OPHTHALMIC | Status: AC
  Filled 2015-04-22: qty 2

## 2015-04-22 MED ORDER — ONDANSETRON HCL 4 MG/2ML IJ SOLN
INTRAMUSCULAR | Status: AC
  Filled 2015-04-22: qty 2

## 2015-04-22 MED ORDER — MORPHINE SULFATE 2 MG/ML IJ SOLN
INTRAMUSCULAR | Status: AC
  Filled 2015-04-22: qty 1

## 2015-04-22 MED ORDER — DIPHENHYDRAMINE HCL 50 MG/ML IJ SOLN
25.0000 mg | Freq: Once | INTRAMUSCULAR | Status: AC
  Administered 2015-04-22: 25 mg via INTRAVENOUS
  Filled 2015-04-22: qty 1

## 2015-04-22 MED ORDER — CLINDAMYCIN HCL 300 MG PO CAPS: 300.0000 mg | ORAL_CAPSULE | Freq: Three times a day (TID) | ORAL | Status: AC

## 2015-04-22 MED ORDER — MORPHINE SULFATE 2 MG/ML IJ SOLN
2.0000 mg | Freq: Once | INTRAMUSCULAR | Status: AC
  Administered 2015-04-22: 2 mg via INTRAVENOUS

## 2015-04-22 MED ORDER — PREDNISONE 10 MG PO TABS: ORAL_TABLET | ORAL | Status: DC

## 2015-04-22 NOTE — ED Provider Notes (Signed)
[facility] Emergency Department Provider Note  ____________________________________________  Time seen: On arrival  I have reviewed the triage vital signs and the nursing notes.   HISTORY  Chief Complaint Headache   HPI ARDICE BOYAN is a 81 y.o. female who presents with complaints of headache. She reports the entire left side of her head is hurting primarily along the forehead and the face. He notes the pain is severe and throbbing . She has a history of headaches and this is somewhat similar. She denies neck pain. No fevers no chills. She said this headache since Wednesday. No change in vision. No focal deficits.     Past Medical History  Diagnosis Date  . Cancer     uterine and questionable vulvar, s/p hysterectomy  . Hypercholesterolemia   . Chronic headaches   . GERD (gastroesophageal reflux disease)   . Glaucoma   . Arthritis   . Right bundle branch block   . Reactive airway disease   . Cystocele   . Rectocele   . Hypertension   . Macular degeneration     Patient Active Problem List   Diagnosis Date Noted  . Frequent falls 12/04/2014  . Hyperglycemia 12/04/2014  . Health care maintenance 12/04/2014  . URI (upper respiratory infection) 10/24/2014  . Hip pain 10/09/2014  . Pain of left thumb 05/31/2014  . Cough 03/14/2014  . Back pain 01/30/2014  . Joint pain 01/30/2014  . Elevated alkaline phosphatase level 01/30/2014  . Neck pain 12/12/2013  . Skin lesion of breast 12/12/2013  . Loss of vision 10/11/2013  . Vision loss, left eye 10/11/2013  . Anxiety 10/11/2013  . Chest pain on exertion 10/11/2013  . DOE (dyspnea on exertion) 10/11/2013  . Headache(784.0) 02/28/2013  . History of frequent urinary tract infections 02/01/2013  . Difficulty sleeping 02/01/2013  . Hypercholesterolemia 02/01/2013  . Pulmonary hypertension 02/01/2013  . Depression 02/01/2013  . Dizziness 09/10/2012  . Essential hypertension, benign 09/10/2012     Past Surgical History  Procedure Laterality Date  . Cholecystectomy    . Appendectomy    . Abdominal hysterectomy    . Vulvectomy      Current Outpatient Rx  Name  Route  Sig  Dispense  Refill  . atorvastatin (LIPITOR) 40 MG tablet   Oral   Take 1 tablet (40 mg total) by mouth daily at 6 PM.   30 tablet   5   . brinzolamide (AZOPT) 1 % ophthalmic suspension   Left Eye   Place 1 drop into the left eye 3 (three) times daily.   10 mL   12   . fluticasone (FLONASE) 50 MCG/ACT nasal spray   Each Nare   Place 2 sprays into both nostrils daily.   16 g   1   . HYDROcodone-acetaminophen (NORCO/VICODIN) 5-325 MG per tablet   Oral   Take 1 tablet by mouth at bedtime as needed for moderate pain.         [name] latanoprost (XALATAN) 0.005 % ophthalmic solution   Both Eyes   Place 1 drop into both eyes at bedtime.   2.5 mL   12   . memantine (NAMENDA) 10 MG tablet   Oral   Take 10 mg by mouth 2 (two) times daily.          . Multiple Vitamin (MULTIVITAMIN) tablet   Oral   Take 1 tablet by mouth daily.         . Multiple Vitamins-Minerals (PRESERVISION  AREDS) TABS   Oral   Take 1 tablet by mouth every morning.         . Omega-3 Fatty Acids (FISH OIL CONCENTRATE PO)   Oral   Take 1 capsule by mouth 2 (two) times daily. 1750mg          . omeprazole (PRILOSEC) 20 MG capsule      TAKE ONE CAPSULE EACH MORNING   30 capsule   6   . sertraline (ZOLOFT) 100 MG tablet   Oral   Take 150 mg by mouth daily.          . travoprost, benzalkonium, (TRAVATAN) 0.004 % ophthalmic solution   Both Eyes   Place 1 drop into both eyes at bedtime.         . traZODone (DESYREL) 150 MG tablet   Oral   Take by mouth at bedtime.         . vitamin D, CHOLECALCIFEROL, 400 UNITS tablet   Oral   Take 400 Units by mouth daily.           Allergies Ambien and Codeine  Family History  Problem Relation Age of Onset  . Heart disease Mother   . Cancer Father      leukemia  . Heart disease Brother     x2  . Cancer Daughter     breast    Social History History  Substance Use Topics  . Smoking status: Never Smoker   . Smokeless tobacco: Never Used  . Alcohol Use: No    Review of Systems  Constitutional: Negative for fever. Eyes: Negative for visual changes. ENT: Negative for sore throat Cardiovascular: Negative for chest pain. Respiratory: Negative for shortness of breath. Gastrointestinal: Negative for abdominal pain, vomiting and diarrhea. Genitourinary: Negative for dysuria. Musculoskeletal: Negative for back pain. Skin: Negative for rash. Neurological: Positive for headache Psychiatric: No anxiety  10-point ROS otherwise negative.  ____________________________________________   PHYSICAL EXAM:  VITAL SIGNS: ED Triage Vitals  Enc Vitals Group     BP 04/22/15 1230 137/70 mmHg     Pulse Rate 04/22/15 1230 78     Resp 04/22/15 1230 16     Temp 04/22/15 1230 98.2 F (36.8 C)     Temp Source 04/22/15 1230 Oral     SpO2 04/22/15 1230 96 %     Weight 04/22/15 1230 151 lb (68.493 kg)     Height 04/22/15 1230 5\' 2"  (1.575 m)     Head Cir --      Peak Flow --      Pain Score 04/22/15 1231 10     Pain Loc --      Pain Edu? --      Excl. in [place]? --      Constitutional: Alert and oriented. Well appearing and uncomfortable Eyes: Conjunctivae are normal. Cornea looks normal . Normal fundus bilaterally. PERRLA.  ENT   Head: Normocephalic and atraumatic. Discomfort with palpation of left maxillary sinus   Mouth/Throat: Mucous membranes are moist. Cardiovascular: Normal rate, regular rhythm. Normal and symmetric distal pulses are present in all extremities. No murmurs, rubs, or gallops. Respiratory: Normal respiratory effort without tachypnea nor retractions. Breath sounds are clear and equal bilaterally.  Gastrointestinal: Soft and non-tender in all quadrants. No distention. There is no CVA tenderness. Genitourinary:  deferred Musculoskeletal: Nontender with normal range of motion in all extremities. No lower extremity tenderness nor edema. Neurologic:  Normal speech and language. No gross focal neurologic deficits are appreciated. Skin:  Skin is warm, dry and intact. No rash noted. Psychiatric: Mood and affect are normal. Patient exhibits appropriate insight and judgment.  ____________________________________________    LABS (pertinent positives/negatives)  Labs Reviewed  COMPREHENSIVE METABOLIC PANEL - Abnormal; Notable for the following:    Glucose, Bld 100 (*)    GFR calc non Af Amer 54 (*)    All other components within normal limits  SEDIMENTATION RATE - Abnormal; Notable for the following:    Sed Rate 31 (*)    All other components within normal limits  CBC    ____________________________________________   EKG  None  ____________________________________________    RADIOLOGY I have personally reviewed any xrays that were ordered on this patient: CT head shows sphenoid sinusitis bilaterally  ____________________________________________   PROCEDURES  Procedure(s) performed: I checked intraocular pressure of the left and right eye with Tono-Pen. Pressure left eye 19, pressure right eye 18  Critical Care performed: none  ____________________________________________   INITIAL IMPRESSION / ASSESSMENT AND PLAN / ED COURSE  Pertinent labs & imaging results that were available during my care of the patient were reviewed by me and considered in my medical decision making (see chart for details).  Differential includes headache/migraine, sinusitis, temporal arteritis, glaucoma, and less likely CVA.  Patient given Reglan and Benadryl with some relief of headache. After CT scan Toradol added as well and then a small dose of morphine. She reports she is feeling significant better. I suspect this pain is coming from her sphenoid sinusitis which is significant.   Discussed with Dr.  Tami Ribas of ENT the CT results who suggests prednisone taper for 2 weeks and clindamycin.  Discussed this with patient and her family and they agree. They agreed to return to the emergency department for any worsening of her symptoms.  ____________________________________________   FINAL CLINICAL IMPRESSION(S) / ED DIAGNOSES  Final diagnoses:  Acute sphenoidal sinusitis, recurrence not specified     Lavonia Drafts, MD 04/22/15 [phone]

## 2015-04-22 NOTE — ED Notes (Signed)
Patient to ED with c/o headache that goes all over head since  Wednesday. Patient reports taking  Tylenol with no relief.

## 2015-04-22 NOTE — Discharge Instructions (Signed)

## 2015-05-08 DIAGNOSIS — R51 Headache: Secondary | ICD-10-CM | POA: Diagnosis not present

## 2015-05-08 DIAGNOSIS — J0130 Acute sphenoidal sinusitis, unspecified: Secondary | ICD-10-CM | POA: Diagnosis not present

## 2015-05-08 DIAGNOSIS — J301 Allergic rhinitis due to pollen: Secondary | ICD-10-CM | POA: Diagnosis not present

## 2015-05-31 DIAGNOSIS — M1711 Unilateral primary osteoarthritis, right knee: Secondary | ICD-10-CM | POA: Diagnosis not present

## 2015-06-08 DIAGNOSIS — J323 Chronic sphenoidal sinusitis: Secondary | ICD-10-CM | POA: Diagnosis not present

## 2015-06-08 DIAGNOSIS — R51 Headache: Secondary | ICD-10-CM | POA: Diagnosis not present

## 2015-06-13 ENCOUNTER — Encounter: Payer: Self-pay | Admitting: *Deleted

## 2015-06-16 NOTE — Discharge Instructions (Signed)
[facility] °[facility] °ENDOSCOPIC SINUS SURGERY °[facility] ° °What is Functional Endoscopic Sinus Surgery? ° The Surgery involves making the natural openings of the sinuses larger by removing the bony partitions that separate the sinuses from the nasal cavity.  The natural sinus lining is preserved as much as possible to allow the sinuses to resume normal function after the surgery.  In some patients nasal polyps (excessively swollen lining of the sinuses) may be removed to relieve obstruction of the sinus openings.  The surgery is performed through the nose using lighted scopes, which eliminates the need for incisions on the face.  A septoplasty is a different procedure which is sometimes performed with sinus surgery.  It involves straightening the boy partition that separates the two sides of your nose.  A crooked or deviated septum may need repair if is obstructing the sinuses or nasal airflow.  Turbinate reduction is also often performed during sinus surgery.  The turbinates are bony proturberances from the side walls of the nose which swell and can obstruct the nose in patients with sinus and allergy problems.  Their size can be surgically reduced to help relieve nasal obstruction. ° °What Can Sinus Surgery Do For Me? ° Sinus surgery can reduce the frequency of sinus infections requiring antibiotic treatment.  This can provide improvement in nasal congestion, post-nasal drainage, facial pressure and nasal obstruction.  Surgery will NOT prevent you from ever having an infection again, so it usually only for patients who get infections 4 or more times yearly requiring antibiotics, or for infections that do not clear with antibiotics.  It will not cure nasal allergies, so patients with allergies may still require medication to treat their allergies after surgery. Surgery may improve headaches related to sinusitis, however, some people will continue to  require medication to control sinus headaches related to allergies.  Surgery will do nothing for other forms of headache (migraine, tension or cluster). ° °What Are the Risks of Endoscopic Sinus Surgery? ° Current techniques allow surgery to be performed safely with little risk, however, there are rare complications that patients should be aware of.  Because the sinuses are located around the eyes, there is risk of eye injury, including blindness, though again, this would be quite rare. This is usually a result of bleeding behind the eye during surgery, which puts the vision oat risk, though there are treatments to protect the vision and prevent permanent disrupted by surgery causing a leak of the spinal fluid that surrounds the brain.  More serious complications would include bleeding inside the brain cavity or damage to the brain.  Again, all of these complications are uncommon, and spinal fluid leaks can be safely managed surgically if they occur.  The most common complication of sinus surgery is bleeding from the nose, which may require packing or cauterization of the nose.  Continued sinus have polyps may experience recurrence of the polyps requiring revision surgery.  Alterations of sense of smell or injury to the tear ducts are also rare complications.  ° °What is the Surgery Like, and what is the Recovery? ° The Surgery usually takes a couple of hours to perform, and is usually performed under a general anesthetic (completely asleep).  Patients are usually discharged home after a couple of hours.  Sometimes during surgery it is necessary to pack the nose to control bleeding, and the packing is left in place for 24 - 48 hours, and removed by your surgeon.    If a septoplasty was performed during the procedure, there is often a splint placed which must be removed after 5-7 days.   °Discomfort: Pain is usually mild to moderate, and can be controlled by prescription pain medication or acetaminophen (Tylenol).   Aspirin, Ibuprofen (Advil, Motrin), or Naprosyn (Aleve) should be avoided, as they can cause increased bleeding.  Most patients feel sinus pressure like they have a bad head cold for several days.  Sleeping with your head elevated can help reduce swelling and facial pressure, as can ice packs over the face.  A humidifier may be helpful to keep the mucous and blood from drying in the nose.  ° °Diet: There are no specific diet restrictions, however, you should generally start with clear liquids and a light diet of bland foods because the anesthetic can cause some nausea.  Advance your diet depending on how your stomach feels.  Taking your pain medication with food will often help reduce stomach upset which pain medications can cause. ° °Nasal Saline Irrigation: It is important to remove blood clots and dried mucous from the nose as it is healing.  This is done by having you irrigate the nose at least 3 - 4 times daily with a salt water solution.  We recommend using NeilMed Sinus Rinse (available at the drug store).  Fill the squeeze bottle with the solution, bend over a sink, and insert the tip of the squeeze bottle into the nose ½ of an inch.  Point the tip of the squeeze bottle towards the inside corner of the eye on the same side your irrigating.  Squeeze the bottle and gently irrigate the nose.  If you bend forward as you do this, most of the fluid will flow back out of the nose, instead of down your throat.   The solution should be warm, near body temperature, when you irrigate.   Each time you irrigate, you should use a full squeeze bottle.  ° °Note that if you are instructed to use Nasal Steroid Sprays at any time after your surgery, irrigate with saline BEFORE using the steroid spray, so you do not wash it all out of the nose. °Another product, Nasal Saline Gel (such as AYR Nasal Saline Gel) can be applied in each nostril 3 - 4 times daily to moisture the nose and reduce scabbing or crusting. ° °Bleeding:   Bloody drainage from the nose can be expected for several days, and patients are instructed to irrigate their nose frequently with salt water to help remove mucous and blood clots.  The drainage may be dark red or brown, though some fresh blood may be seen intermittently, especially after irrigation.  Do not blow you nose, as bleeding may occur. If you must sneeze, keep your mouth open to allow air to escape through your mouth. ° °If heavy bleeding occurs: Irrigate the nose with saline to rinse out clots, then spray the nose 3 - 4 times with Afrin Nasal Decongestant Spray.  The spray will constrict the blood vessels to slow bleeding.  Pinch the lower half of your nose shut to apply pressure, and lay down with your head elevated.  Ice packs over the nose may help as well. If bleeding persists despite these measures, you should notify your doctor.  Do not use the Afrin routinely to control nasal congestion after surgery, as it can result in worsening congestion and may affect healing.  ° ° ° °Activity: Return to work varies among patients. Most patients will be   out of work at least 5 - 7 days to recover.  Patient may return to work after they are off of narcotic pain medication, and feeling well enough to perform the functions of their job.  Patients must avoid heavy lifting (over 10 pounds) or strenuous physical for 2 weeks after surgery, so your employer may need to assign you to light duty, or keep you out of work longer if light duty is not possible.  NOTE: you should not drive, operate dangerous machinery, do any mentally demanding tasks or make any important legal or financial decisions while on narcotic pain medication and recovering from the general anesthetic.  °  °Call Your Doctor Immediately if You Have Any of the Following: °1. Bleeding that you cannot control with the above measures °2. Loss of vision, double vision, bulging of the eye or black eyes. °3. Fever over 101 degrees °4. Neck stiffness with  severe headache, fever, nausea and change in mental state. °You are always encourage to call anytime with concerns, however, please call with requests for pain medication refills during office hours. ° °Office Endoscopy: During follow-up visits your doctor will remove any packing or splints that may have been placed and evaluate and clean your sinuses endoscopically.  Topical anesthetic will be used to make this as comfortable as possible, though you may want to take your pain medication prior to the visit.  How often this will need to be done varies from patient to patient.  After complete recovery from the surgery, you may need follow-up endoscopy from time to time, particularly if there is concern of recurrent infection or nasal polyps. ° °General Anesthesia, Care After °Refer to this sheet in the next few weeks. These instructions provide you with information on caring for yourself after your procedure. Your health care provider may also give you more specific instructions. Your treatment has been planned according to current medical practices, but problems sometimes occur. Call your health care provider if you have any problems or questions after your procedure. °WHAT TO EXPECT AFTER THE PROCEDURE °After the procedure, it is typical to experience: °· Sleepiness. °· Nausea and vomiting. °HOME CARE INSTRUCTIONS °· For the first 24 hours after general anesthesia: °¨ Have a responsible person with you. °¨ Do not drive a car. If you are alone, do not take public transportation. °¨ Do not drink alcohol. °¨ Do not take medicine that has not been prescribed by your health care provider. °¨ Do not sign important papers or make important decisions. °¨ You may resume a normal diet and activities as directed by your health care provider. °· Change bandages (dressings) as directed. °· If you have questions or problems that seem related to general anesthesia, call the hospital and ask for the anesthetist or anesthesiologist  on call. °SEEK MEDICAL CARE IF: °· You have nausea and vomiting that continue the day after anesthesia. °· You develop a rash. °SEEK IMMEDIATE MEDICAL CARE IF:  °· You have difficulty breathing. °· You have chest pain. °· You have any allergic problems. °Document Released: 12/30/2000 Document Revised: 09/28/2013 Document Reviewed: 04/08/2013 °[employer]® Patient Information ©2015 [employer]. This information is not intended to replace advice given to you by your health care provider. Make sure you discuss any questions you have with your health care provider. ° °

## 2015-06-20 ENCOUNTER — Encounter
Admission: RE | Disposition: A | Discharge status: Home / Self Care | Payer: Self-pay | Source: Ambulatory Visit | Attending: Otolaryngology

## 2015-06-20 ENCOUNTER — Ambulatory Visit
Admission: RE | Admit: 2015-06-20 | Discharge: 2015-06-20 | Disposition: A | Discharge status: Home / Self Care | Payer: Medicare Other | Source: Ambulatory Visit | Attending: Otolaryngology | Admitting: Otolaryngology

## 2015-06-20 ENCOUNTER — Ambulatory Visit: Payer: Medicare Other | Admitting: Anesthesiology

## 2015-06-20 DIAGNOSIS — K219 Gastro-esophageal reflux disease without esophagitis: Secondary | ICD-10-CM | POA: Insufficient documentation

## 2015-06-20 DIAGNOSIS — R0602 Shortness of breath: Secondary | ICD-10-CM | POA: Diagnosis not present

## 2015-06-20 DIAGNOSIS — J323 Chronic sphenoidal sinusitis: Secondary | ICD-10-CM | POA: Insufficient documentation

## 2015-06-20 DIAGNOSIS — B49 Unspecified mycosis: Secondary | ICD-10-CM | POA: Diagnosis not present

## 2015-06-20 DIAGNOSIS — Z888 Allergy status to other drugs, medicaments and biological substances status: Secondary | ICD-10-CM | POA: Diagnosis not present

## 2015-06-20 DIAGNOSIS — Z885 Allergy status to narcotic agent status: Secondary | ICD-10-CM | POA: Diagnosis not present

## 2015-06-20 DIAGNOSIS — I10 Essential (primary) hypertension: Secondary | ICD-10-CM | POA: Diagnosis not present

## 2015-06-20 HISTORY — PX: SPHENOIDECTOMY: SHX2421

## 2015-06-20 HISTORY — DX: Unspecified dementia, unspecified severity, without behavioral disturbance, psychotic disturbance, mood disturbance, and anxiety: F03.90

## 2015-06-20 HISTORY — DX: Dizziness and giddiness: R42

## 2015-06-20 HISTORY — DX: Presence of dental prosthetic device (complete) (partial): Z97.2

## 2015-06-20 HISTORY — PX: IMAGE GUIDED SINUS SURGERY: SHX6570

## 2015-06-20 SURGERY — SINUS SURGERY, WITH IMAGING GUIDANCE
Anesthesia: General | Wound class: Clean Contaminated

## 2015-06-20 MED ORDER — OXYMETAZOLINE HCL 0.05 % NA SOLN
NASAL | Status: DC | PRN
  Administered 2015-06-20: 1 via TOPICAL

## 2015-06-20 MED ORDER — OXYCODONE HCL 5 MG PO TABS
5.0000 mg | ORAL_TABLET | Freq: Once | ORAL | Status: AC | PRN
  Administered 2015-06-20: 5 mg via ORAL

## 2015-06-20 MED ORDER — HYDROCODONE-ACETAMINOPHEN 5-325 MG PO TABS: 1.0000 | ORAL_TABLET | ORAL | Status: DC | PRN

## 2015-06-20 MED ORDER — ONDANSETRON HCL 4 MG/2ML IJ SOLN
INTRAMUSCULAR | Status: DC | PRN
  Administered 2015-06-20: 4 mg via INTRAVENOUS

## 2015-06-20 MED ORDER — OXYCODONE HCL 5 MG/5ML PO SOLN: 5.0000 mg | Freq: Once | ORAL | Status: AC | PRN

## 2015-06-20 MED ORDER — FENTANYL CITRATE (PF) 100 MCG/2ML IJ SOLN
25.0000 ug | INTRAMUSCULAR | Status: DC | PRN
  Administered 2015-06-20 (×2): 25 ug via INTRAVENOUS

## 2015-06-20 MED ORDER — ONDANSETRON HCL 4 MG/2ML IJ SOLN: 4.0000 mg | Freq: Once | INTRAMUSCULAR | Status: DC | PRN

## 2015-06-20 MED ORDER — PROPOFOL 10 MG/ML IV BOLUS
INTRAVENOUS | Status: DC | PRN
  Administered 2015-06-20: 70 mg via INTRAVENOUS

## 2015-06-20 MED ORDER — MIDAZOLAM HCL 5 MG/5ML IJ SOLN
INTRAMUSCULAR | Status: DC | PRN
  Administered 2015-06-20: 1 mg via INTRAVENOUS

## 2015-06-20 MED ORDER — NEOSTIGMINE METHYLSULFATE 10 MG/10ML IV SOLN
INTRAVENOUS | Status: DC | PRN
  Administered 2015-06-20: 3 mg via INTRAVENOUS

## 2015-06-20 MED ORDER — LIDOCAINE HCL (CARDIAC) 20 MG/ML IV SOLN
INTRAVENOUS | Status: DC | PRN
  Administered 2015-06-20: 40 mg via INTRAVENOUS

## 2015-06-20 MED ORDER — ROCURONIUM BROMIDE 100 MG/10ML IV SOLN
INTRAVENOUS | Status: DC | PRN
  Administered 2015-06-20: 40 mg via INTRAVENOUS

## 2015-06-20 MED ORDER — DEXAMETHASONE SODIUM PHOSPHATE 4 MG/ML IJ SOLN
INTRAMUSCULAR | Status: DC | PRN
  Administered 2015-06-20: 10 mg via INTRAVENOUS

## 2015-06-20 MED ORDER — FENTANYL CITRATE (PF) 100 MCG/2ML IJ SOLN
INTRAMUSCULAR | Status: DC | PRN
  Administered 2015-06-20 (×2): 50 ug via INTRAVENOUS

## 2015-06-20 MED ORDER — GLYCOPYRROLATE 0.2 MG/ML IJ SOLN
INTRAMUSCULAR | Status: DC | PRN
  Administered 2015-06-20: 0.4 mg via INTRAVENOUS

## 2015-06-20 MED ORDER — LIDOCAINE-EPINEPHRINE 1 %-1:100000 IJ SOLN
INTRAMUSCULAR | Status: DC | PRN
  Administered 2015-06-20: 14 mL

## 2015-06-20 MED ORDER — LACTATED RINGERS IV SOLN
INTRAVENOUS | Status: DC
  Administered 2015-06-20: 09:00:00 via INTRAVENOUS

## 2015-06-20 SURGICAL SUPPLY — 25 items
BALLOON SINUPLASTY SYSTEM (BALLOONS) IMPLANT
BATTERY INSTRU NAVIGATION (MISCELLANEOUS) ×16 IMPLANT
BLADE IRRIGATOR 40D CVD (IRRIGATION / IRRIGATOR) IMPLANT
CANISTER SUCT 1200ML W/VALVE (MISCELLANEOUS) ×4 IMPLANT
COAG SUCT 10F 3.5MM HAND CTRL (MISCELLANEOUS) ×4 IMPLANT
DEVICE INFLATION SEID (MISCELLANEOUS) IMPLANT
DRAPE HEAD BAR (DRAPES) ×4 IMPLANT
DRESSING NASL FOAM PST OP SINU (MISCELLANEOUS) IMPLANT
DRSG NASAL 4CM NASOPORE (MISCELLANEOUS) IMPLANT
DRSG NASAL FOAM POST OP SINU (MISCELLANEOUS)
GLOVE BIO SURGEON STRL SZ7.5 (GLOVE) ×12 IMPLANT
IRRIGATOR 4MM STR (IRRIGATION / IRRIGATOR) ×4 IMPLANT
IV NS 500ML (IV SOLUTION) ×2
IV NS 500ML BAXH (IV SOLUTION) ×2 IMPLANT
NAVIGATION MASK REG  ST (MISCELLANEOUS) ×4 IMPLANT
NS IRRIG 500ML POUR BTL (IV SOLUTION) ×4 IMPLANT
PACK DRAPE NASAL/ENT (PACKS) ×4 IMPLANT
PACKING NASAL EPIS 4X2.4 XEROG (MISCELLANEOUS) IMPLANT
PAD GROUND ADULT SPLIT (MISCELLANEOUS) ×4 IMPLANT
PATTIES SURGICAL .5 X3 (DISPOSABLE) ×4 IMPLANT
SET HANDPIECE IRR DIEGO (MISCELLANEOUS) ×4 IMPLANT
SOL ANTI-FOG 6CC FOG-OUT (MISCELLANEOUS) ×2 IMPLANT
SOL FOG-OUT ANTI-FOG 6CC (MISCELLANEOUS) ×2
SYRINGE 10CC LL (SYRINGE) ×4 IMPLANT
WATER STERILE IRR 500ML POUR (IV SOLUTION) IMPLANT

## 2015-06-20 NOTE — Transfer of Care (Signed)
Immediate Anesthesia Transfer of Care Note  Patient: [name]  Procedure(s) Performed: Procedure(s) with comments: IMAGE GUIDED SINUS SURGERY (N/A) - GAVE DISK TO CE CE SPHENOIDECTOMY (Bilateral)  Patient Location: PACU  Anesthesia Type: General  Level of Consciousness: awake, alert  and patient cooperative  Airway and Oxygen Therapy: Patient Spontanous Breathing and Patient connected to supplemental oxygen  Post-op Assessment: Post-op Vital signs reviewed, Patient's Cardiovascular Status Stable, Respiratory Function Stable, Patent Airway and No signs of Nausea or vomiting  Post-op Vital Signs: Reviewed and stable  Complications: No apparent anesthesia complications

## 2015-06-20 NOTE — H&P (Signed)
History and physical reviewed and will be scanned in later. No change in medical status reported by the patient or family, appears stable for surgery. All questions regarding the procedure answered, and patient (or family if a child) expressed understanding of the procedure.  [name] @TODAY@ 

## 2015-06-20 NOTE — Anesthesia Procedure Notes (Signed)
Procedure Name: Intubation Date/Time: 06/20/2015 10:04 AM Performed by: Londell Moh Pre-anesthesia Checklist: Patient identified, Emergency Drugs available, Suction available, Patient being monitored and Timeout performed Patient Re-evaluated:Patient Re-evaluated prior to inductionOxygen Delivery Method: Circle system utilized Preoxygenation: Pre-oxygenation with 100% oxygen Intubation Type: IV induction Ventilation: Mask ventilation without difficulty Grade View: Grade I Tube type: Oral Rae Tube size: 7.0 mm Number of attempts: 1 Placement Confirmation: ETT inserted through vocal cords under direct vision,  positive ETCO2 and breath sounds checked- equal and bilateral Tube secured with: Tape Dental Injury: Teeth and Oropharynx as per pre-operative assessment

## 2015-06-20 NOTE — Anesthesia Postprocedure Evaluation (Signed)
  Anesthesia Post-op Note  Patient: [name]  Procedure(s) Performed: Procedure(s) with comments: IMAGE GUIDED SINUS SURGERY (N/A) - GAVE DISK TO CE CE SPHENOIDECTOMY (Bilateral)  Anesthesia type:General  Patient location: PACU  Post pain: Pain level controlled  Post assessment: Post-op Vital signs reviewed, Patient's Cardiovascular Status Stable, Respiratory Function Stable, Patent Airway and No signs of Nausea or vomiting  Post vital signs: Reviewed and stable  Last Vitals:  Filed Vitals:   06/20/15 0856  BP: 125/64  Pulse: 79  Temp: 36.7 C  Resp: 20    Level of consciousness: awake, alert  and patient cooperative  Complications: No apparent anesthesia complications

## 2015-06-20 NOTE — Op Note (Signed)
06/20/2015  11:18 AM    [name]  [id number]   Pre-Op Diagnosis:  CHRONIC SPHENOID SINUSITIS  Post-op Diagnosis: CHRONIC SPHENOID SINUSITIS  Procedure:  1)  Image Guided Sinus Surgery,   2)  Bilateral  endoscopic Sphenoidotomy    Surgeon:  Riley Nearing  Anesthesia:  General endotracheal  EBL:  Less than 25 cc   Complications:  None  Findings:  large accumulation of debris which appeared consistent with allergic fungal sinusitis within the sphenoid sinuses  Procedure: After the patient was identified in holding and the benefits of the procedure were reviewed as well as the consent and risks, the patient was taken to the operating room and with the patient in a comfortable supine position,  general orotracheal anesthesia was induced without difficulty.  A proper time-out was performed.  The [employer] image guidance system was set up and calibrated in the normal fashion and felt to be acceptable.  Next 1% Xylocaine with 1:100,000 epinephrine was infiltrated into the inferior  Posterior septum and middle turbinates bilaterally.  Several minutes were allowed for this to take effect.  Cottoniod pledgets soaked in Afrin were placed into both nasal cavities and left while the patient was prepped and draped in the standard fashion. The image guided suction was calibrated and used to inspect known points in the nasal cavity to assess accuracy of the image guided system. Accuracy was felt to be excellent.    Next the scope was passed medial to the middle turbinate, which was lateralized slightly with the Cottle elevator, and the right  sphenoid recess inspected. With the assistance of the image guided system, the sphenoid sinus was carefully entered through some thin bone at the anterior face of the spenoid, medial to the superior turbinate, and widely opened with through-cutting sphenoid punch forceps. Thick material  was suctioned from the sphenoid sinus. This was sent for cultures and  appeared consistent with material typical of allergic fungal sinusitis. The same procedure was then performed on the left side, widely opening the sphenoid os. The patient basically had a large right sphenoid sinus full of debris which extended past the midline into the other side. The sinus was repeatedly irrigated to remove all of this fungal debris until sinus was completely clear. Careful inspection with the image guided suction was used to evaluate the tiny hypoplastic left sphenoid sinus. This appeared to have been opened slightly by the left sphenoidotomy, opening the medial wall of that sinus. With minimal bleeding there was no need for any sinus packing.   The patient was then returned to the anesthesiologist for awakening and taken to recovery room in good condition postoperatively.  Disposition:   PACU and d/c home  Plan: Ice, elevation, narcotic analgesia. Begin sinus irrigations with saline tomrrow, irrigating 3-4 times daily. Return to the office in 7 days.  Return to work in 7-10 days, no strenuous activities for two weeks.   Riley Nearing 06/20/2015 11:18 AM

## 2015-06-20 NOTE — Anesthesia Preprocedure Evaluation (Addendum)
Anesthesia Evaluation  Patient identified by MRN, date of birth, ID band Patient awake    Reviewed: Allergy & Precautions, H&P , NPO status , Patient's Chart, lab work & pertinent test results, reviewed documented beta blocker date and time   Airway Mallampati: II  TM Distance: >3 FB Neck ROM: full    Dental  (+) Upper Dentures, Lower Dentures   Pulmonary shortness of breath and with exertion,    Pulmonary exam normal breath sounds clear to auscultation       Cardiovascular Exercise Tolerance: Good hypertension, + DOE  + dysrhythmias  Rhythm:regular Rate:Normal     Neuro/Psych  Headaches, PSYCHIATRIC DISORDERS negative psych ROS   GI/Hepatic Neg liver ROS, GERD  Medicated,  Endo/Other  negative endocrine ROS  Renal/GU negative Renal ROS  negative genitourinary   Musculoskeletal   Abdominal   Peds  Hematology negative hematology ROS (+)   Anesthesia Other Findings   Reproductive/Obstetrics negative OB ROS                           Anesthesia Physical Anesthesia Plan  ASA: III  Anesthesia Plan: General   Post-op Pain Management:    Induction:   Airway Management Planned:   Additional Equipment:   Intra-op Plan:   Post-operative Plan:   Informed Consent: I have reviewed the patients History and Physical, chart, labs and discussed the procedure including the risks, benefits and alternatives for the proposed anesthesia with the patient or authorized representative who has indicated his/her understanding and acceptance.     Plan Discussed with: CRNA  Anesthesia Plan Comments:         Anesthesia Quick Evaluation

## 2015-06-21 ENCOUNTER — Encounter: Payer: Self-pay | Admitting: Otolaryngology

## 2015-06-22 LAB — SURGICAL PATHOLOGY

## 2015-06-26 DIAGNOSIS — J323 Chronic sphenoidal sinusitis: Secondary | ICD-10-CM | POA: Diagnosis not present

## 2015-06-26 DIAGNOSIS — R51 Headache: Secondary | ICD-10-CM | POA: Diagnosis not present

## 2015-07-03 ENCOUNTER — Other Ambulatory Visit (INDEPENDENT_AMBULATORY_CARE_PROVIDER_SITE_OTHER): Payer: Medicare Other

## 2015-07-03 DIAGNOSIS — E7800 Pure hypercholesterolemia, unspecified: Secondary | ICD-10-CM

## 2015-07-03 DIAGNOSIS — R739 Hyperglycemia, unspecified: Secondary | ICD-10-CM

## 2015-07-03 DIAGNOSIS — I10 Essential (primary) hypertension: Secondary | ICD-10-CM | POA: Diagnosis not present

## 2015-07-03 DIAGNOSIS — F32A Depression, unspecified: Secondary | ICD-10-CM

## 2015-07-03 DIAGNOSIS — F329 Major depressive disorder, single episode, unspecified: Secondary | ICD-10-CM

## 2015-07-03 LAB — LIPID PANEL
CHOL/HDL RATIO: 2
Cholesterol: 177 mg/dL (ref 0–200)
HDL: 87.6 mg/dL (ref >39.00)
LDL CALC: 64 mg/dL (ref 0–99)
NONHDL: 89.03
TRIGLYCERIDES: 124 mg/dL (ref 0.0–149.0)
VLDL: 24.8 mg/dL (ref 0.0–40.0)

## 2015-07-03 LAB — CBC WITH DIFFERENTIAL/PLATELET
Basophils Absolute: 0 10*3/uL (ref 0.0–0.1)
Basophils Relative: 0.3 % (ref 0.0–3.0)
EOS PCT: 1 % (ref 0.0–5.0)
Eosinophils Absolute: 0.1 10*3/uL (ref 0.0–0.7)
HEMATOCRIT: 41.7 % (ref 36.0–46.0)
HEMOGLOBIN: 13.8 g/dL (ref 12.0–15.0)
LYMPHS ABS: 2.8 10*3/uL (ref 0.7–4.0)
LYMPHS PCT: 30.2 % (ref 12.0–46.0)
MCHC: 33 g/dL (ref 30.0–36.0)
MCV: 97.6 fl (ref 78.0–100.0)
MONOS PCT: 4.4 % (ref 3.0–12.0)
Monocytes Absolute: 0.4 10*3/uL (ref 0.1–1.0)
NEUTROS PCT: 64.1 % (ref 43.0–77.0)
Neutro Abs: 6 10*3/uL (ref 1.4–7.7)
Platelets: 290 10*3/uL (ref 150.0–400.0)
RBC: 4.27 Mil/uL (ref 3.87–5.11)
RDW: 13.6 % (ref 11.5–15.5)
WBC: 9.3 10*3/uL (ref 4.0–10.5)

## 2015-07-03 LAB — BASIC METABOLIC PANEL
BUN: 16 mg/dL (ref 6–23)
CHLORIDE: 105 meq/L (ref 96–112)
CO2: 25 mEq/L (ref 19–32)
Calcium: 9.6 mg/dL (ref 8.4–10.5)
Creatinine, Ser: 0.93 mg/dL (ref 0.40–1.20)
GFR: 60.57 mL/min (ref >60.00)
Glucose, Bld: 114 mg/dL — ABNORMAL HIGH (ref 70–99)
POTASSIUM: 3.8 meq/L (ref 3.5–5.1)
SODIUM: 141 meq/L (ref 135–145)

## 2015-07-03 LAB — HEMOGLOBIN A1C: HEMOGLOBIN A1C: 6.1 % (ref 4.6–6.5)

## 2015-07-03 LAB — HEPATIC FUNCTION PANEL
ALBUMIN: 3.7 g/dL (ref 3.5–5.2)
ALT: 17 U/L (ref 0–35)
AST: 13 U/L (ref 0–37)
Alkaline Phosphatase: 89 U/L (ref 39–117)
Bilirubin, Direct: 0.1 mg/dL (ref 0.0–0.3)
TOTAL PROTEIN: 6.4 g/dL (ref 6.0–8.3)
Total Bilirubin: 0.4 mg/dL (ref 0.2–1.2)

## 2015-07-03 LAB — TSH: TSH: 2.81 u[IU]/mL (ref 0.35–4.50)

## 2015-07-07 ENCOUNTER — Ambulatory Visit (INDEPENDENT_AMBULATORY_CARE_PROVIDER_SITE_OTHER): Payer: Medicare Other | Admitting: Internal Medicine

## 2015-07-07 ENCOUNTER — Ambulatory Visit
Admission: RE | Admit: 2015-07-07 | Discharge: 2015-07-07 | Disposition: A | Discharge status: Home / Self Care | Payer: Medicare Other | Source: Ambulatory Visit | Attending: Internal Medicine | Admitting: Internal Medicine

## 2015-07-07 ENCOUNTER — Encounter: Payer: Self-pay | Admitting: Internal Medicine

## 2015-07-07 VITALS — BP 126/70 | HR 76 | Temp 98.9°F | Resp 18 | Ht 62.0 in | Wt 154.2 lb

## 2015-07-07 DIAGNOSIS — R0609 Other forms of dyspnea: Secondary | ICD-10-CM

## 2015-07-07 DIAGNOSIS — E7800 Pure hypercholesterolemia, unspecified: Secondary | ICD-10-CM

## 2015-07-07 DIAGNOSIS — R519 Headache, unspecified: Secondary | ICD-10-CM

## 2015-07-07 DIAGNOSIS — Z0000 Encounter for general adult medical examination without abnormal findings: Secondary | ICD-10-CM

## 2015-07-07 DIAGNOSIS — R739 Hyperglycemia, unspecified: Secondary | ICD-10-CM

## 2015-07-07 DIAGNOSIS — R0602 Shortness of breath: Secondary | ICD-10-CM

## 2015-07-07 DIAGNOSIS — I10 Essential (primary) hypertension: Secondary | ICD-10-CM | POA: Diagnosis not present

## 2015-07-07 DIAGNOSIS — R51 Headache: Secondary | ICD-10-CM

## 2015-07-07 DIAGNOSIS — F419 Anxiety disorder, unspecified: Secondary | ICD-10-CM | POA: Diagnosis not present

## 2015-07-07 DIAGNOSIS — F32A Depression, unspecified: Secondary | ICD-10-CM

## 2015-07-07 DIAGNOSIS — F329 Major depressive disorder, single episode, unspecified: Secondary | ICD-10-CM | POA: Diagnosis not present

## 2015-07-07 DIAGNOSIS — R0789 Other chest pain: Secondary | ICD-10-CM

## 2015-07-07 DIAGNOSIS — R748 Abnormal levels of other serum enzymes: Secondary | ICD-10-CM

## 2015-07-07 NOTE — Progress Notes (Signed)
Pre-visit discussion using our clinic review tool. No additional management support is needed unless otherwise documented below in the visit note.  

## 2015-07-07 NOTE — Progress Notes (Signed)
Patient ID: [name], female   DOB: [birth year], 81 y.o.   MRN: [id number]   Subjective:    Patient ID: [name], female    DOB: Feb 09, 1926, 81 y.o.   MRN: [id number]  HPI  Patient with past history of hypertension, hyperglycemia and hypercholesterolemia.  She comes in today to follow up on these issues as well as for a complete physical exam.  She is accompanied by her daughter.  History obtained from both of them.  She just recently sinus surgery (sphenoidectomy).  Seeing ENT.  Headache has resolved.  Some nasal congestion.  Also reports noticing some intermittent chest tightness.  No chest congestion.  No cough.  Some sob with exertion.  She feels this is new since her surgery.  No increased acid reflux.  No abdominal pain or cramping.  Bowels stable.  Some joint aches.     Past Medical History  Diagnosis Date  . [facility])     uterine and questionable vulvar, s/p hysterectomy  . Hypercholesterolemia   . Chronic headaches   . GERD (gastroesophageal reflux disease)   . Glaucoma   . Arthritis   . Right bundle branch block   . Reactive airway disease   . Cystocele   . Rectocele   . Hypertension   . Macular degeneration   . Dementia     able to sign own papers  . Vertigo     none - several yrs  . Wears dentures     full upper and lower   Past Surgical History  Procedure Laterality Date  . Cholecystectomy    . Appendectomy    . Abdominal hysterectomy    . Vulvectomy    . Replacement total knee Left   . Image guided sinus surgery N/A 06/20/2015    Procedure: IMAGE GUIDED SINUS SURGERY;  Surgeon: Clyde Canterbury, MD;  Location: [place];  Service: ENT;  Laterality: N/A;  GAVE DISK TO CE CE  . Sphenoidectomy Bilateral 06/20/2015    Procedure: SPHENOIDECTOMY;  Surgeon: Clyde Canterbury, MD;  Location: [place];  Service: ENT;  Laterality: Bilateral;   Family History  Problem Relation Age of Onset  . Heart disease Mother   . Cancer Father     leukemia    . Heart disease Brother     x2  . Cancer Daughter     breast   Social History   Social History  . Marital Status: Married    Spouse Name: N/A  . Number of Children: N/A  . Years of Education: N/A   Social History Main Topics  . Smoking status: Never Smoker   . Smokeless tobacco: Never Used  . Alcohol Use: No  . Drug Use: No  . Sexual Activity: No   Other Topics Concern  . None   Social History Narrative    Outpatient Encounter Prescriptions as of 07/07/2015  Medication Sig  . atorvastatin (LIPITOR) 40 MG tablet Take 1 tablet (40 mg total) by mouth daily at 6 PM.  . brinzolamide (AZOPT) 1 % ophthalmic suspension Place 1 drop into the left eye 3 (three) times daily.  . fluticasone (FLONASE) 50 MCG/ACT nasal spray Place 2 sprays into both nostrils daily.  [name] HYDROcodone-acetaminophen (NORCO/VICODIN) 5-325 MG per tablet Take 1 tablet by mouth every 4 (four) hours as needed for moderate pain.  [name] latanoprost (XALATAN) 0.005 % ophthalmic solution Place 1 drop into both eyes at bedtime.  . memantine (NAMENDA) 10 MG tablet Take 10 mg  by mouth 2 (two) times daily.   . Multiple Vitamin (MULTIVITAMIN) tablet Take 1 tablet by mouth daily.  . Multiple Vitamins-Minerals (PRESERVISION AREDS) TABS Take 1 tablet by mouth every morning.  . Omega-3 Fatty Acids (FISH OIL CONCENTRATE PO) Take 1 capsule by mouth 2 (two) times daily. $RemoveBefo'1750mg'jHXjRETSuzE$   . omeprazole (PRILOSEC) 20 MG capsule TAKE ONE CAPSULE EACH MORNING  . oxybutynin (OXYTROL) 3.9 MG/24HR Place 1 patch onto the skin every 3 (three) days.  [name] sertraline (ZOLOFT) 100 MG tablet Take 150 mg by mouth daily.   . travoprost, benzalkonium, (TRAVATAN) 0.004 % ophthalmic solution Place 1 drop into both eyes at bedtime.  . traZODone (DESYREL) 150 MG tablet Take by mouth at bedtime.  . vitamin D, CHOLECALCIFEROL, 400 UNITS tablet Take 400 Units by mouth daily.  . [DISCONTINUED] predniSONE (DELTASONE) 10 MG tablet Day 1: take 6 tablets Day 2: Take 6  tablets Day 3: Take 5 tablets Day 4: Take 5 tablets Day 5,6: take 4 tablets Day:7,8: take 3 tablets Day, 9,10: Take 2 tablets Day 11, 12: Take 1 tablets   No facility-administered encounter medications on file as of 07/07/2015.    Review of Systems  Constitutional: Negative for appetite change and unexpected weight change.  HENT: Positive for congestion (some nasal congestion.  s/p sinus surgery. ). Negative for sinus pressure.   Eyes: Negative for pain and visual disturbance.  Respiratory: Positive for chest tightness and shortness of breath (some sob with exertion. ). Negative for cough.   Cardiovascular: Negative for palpitations and leg swelling.  Gastrointestinal: Negative for nausea, vomiting, abdominal pain and diarrhea.  Genitourinary: Negative for dysuria and difficulty urinating.  Musculoskeletal: Negative for back pain.       Increased joint aches as outlined.   Skin: Negative for color change and rash.  Neurological: Negative for dizziness, light-headedness and headaches.  Hematological: Negative for adenopathy. Does not bruise/bleed easily.  Psychiatric/Behavioral: Negative for dysphoric mood and agitation.       Objective:     Blood pressure rechecked by me:  122/78  Physical Exam  Constitutional: She is oriented to person, place, and time. She appears well-developed and well-nourished. No distress.  HENT:  Nose: Nose normal.  Mouth/Throat: Oropharynx is clear and moist.  Eyes: Right eye exhibits no discharge. Left eye exhibits no discharge. No scleral icterus.  Neck: Neck supple. No thyromegaly present.  Cardiovascular: Normal rate and regular rhythm.   Pulmonary/Chest: Breath sounds normal. No accessory muscle usage. No tachypnea. No respiratory distress. She has no decreased breath sounds. She has no wheezes. She has no rhonchi. Right breast exhibits no inverted nipple, no mass, no nipple discharge and no tenderness (no axillary adenopathy). Left breast  exhibits no inverted nipple, no mass, no nipple discharge and no tenderness (no axilarry adenopathy).  Abdominal: Soft. Bowel sounds are normal. There is no tenderness.  Musculoskeletal: She exhibits no edema or tenderness.  Lymphadenopathy:    She has no cervical adenopathy.  Neurological: She is alert and oriented to person, place, and time.  Skin: Skin is warm. No rash noted. No erythema.  Psychiatric: She has a normal mood and affect. Her behavior is normal.    BP 126/70 mmHg  Pulse 76  Temp(Src) 98.9 F (37.2 C) (Oral)  Resp 18  Ht $R'5\' 2"'oE$  (1.575 m)  Wt 154 lb 4 oz (69.967 kg)  BMI 28.21 kg/m2  SpO2 96% Wt Readings from Last 3 Encounters:  07/07/15 154 lb 4 oz (69.967 kg)  06/20/15 153  lb (69.4 kg)  04/22/15 151 lb (68.493 kg)     Lab Results  Component Value Date   WBC 9.3 07/03/2015   HGB 13.8 07/03/2015   HCT 41.7 07/03/2015   PLT 290.0 07/03/2015   GLUCOSE 114* 07/03/2015   CHOL 177 07/03/2015   TRIG 124.0 07/03/2015   HDL 87.60 07/03/2015   LDLCALC 64 07/03/2015   ALT 17 07/03/2015   AST 13 07/03/2015   NA 141 07/03/2015   K 3.8 07/03/2015   CL 105 07/03/2015   CREATININE 0.93 07/03/2015   BUN 16 07/03/2015   CO2 25 07/03/2015   TSH 2.81 07/03/2015   INR 1.01 10/11/2013   HGBA1C 6.1 07/03/2015   MICROALBUR 1.0 03/01/2015       Assessment & Plan:   Problem List Items Addressed This Visit    Anxiety    Seeing Dr Nicolasa Ducking.  Continue same medication regimen.        Depression    Followed by Dr Nicolasa Ducking.  Appears to be doing better.  Follow.       DOE (dyspnea on exertion)    Has noticed some chest tightness and sob with exertion.  EKG obtained and revealed SR with RBBB and non specific changes.  Given new symptoms, will have cardiology evaluate with question of need for further cardiac w/up.        Elevated alkaline phosphatase level    Follow liver panel.       Essential hypertension, benign    Blood pressure has been under good control.  Same  medication regimen.  Follow pressures.  Follow metabolic panel.       Headache    Resolved after sinus surgery.        Health care maintenance    Physical today 07/07/15.  Desires not to pursue mammogram.        Hypercholesterolemia    On lipitor.  Low cholesterol diet and exercise.  Follow lipid panel and liver function tests.        Hyperglycemia    Low carb diet and exercise.  Follow met b and a1c.        Other Visit Diagnoses    SOB (shortness of breath)    -  Primary    Relevant Orders    EKG 12-Lead (Completed)    DG Chest 2 View (Completed)    Ambulatory referral to Cardiology    Chest tightness        Relevant Orders    Ambulatory referral to Cardiology        Einar Pheasant, MD

## 2015-07-10 DIAGNOSIS — R51 Headache: Secondary | ICD-10-CM | POA: Diagnosis not present

## 2015-07-10 DIAGNOSIS — J323 Chronic sphenoidal sinusitis: Secondary | ICD-10-CM | POA: Diagnosis not present

## 2015-07-12 DIAGNOSIS — F0280 Dementia in other diseases classified elsewhere without behavioral disturbance: Secondary | ICD-10-CM | POA: Diagnosis not present

## 2015-07-12 DIAGNOSIS — F330 Major depressive disorder, recurrent, mild: Secondary | ICD-10-CM | POA: Diagnosis not present

## 2015-07-15 ENCOUNTER — Encounter: Payer: Self-pay | Admitting: Internal Medicine

## 2015-07-15 NOTE — Assessment & Plan Note (Signed)
Resolved after sinus surgery.

## 2015-07-15 NOTE — Assessment & Plan Note (Signed)
On lipitor.  Low cholesterol diet and exercise.  Follow lipid panel and liver function tests.   

## 2015-07-15 NOTE — Assessment & Plan Note (Signed)
Followed by Dr Kapur.  Appears to be doing better.  Follow.  

## 2015-07-15 NOTE — Assessment & Plan Note (Signed)
Seeing Dr Nicolasa Ducking.  Continue same medication regimen.

## 2015-07-15 NOTE — Assessment & Plan Note (Signed)
Follow liver panel.  

## 2015-07-15 NOTE — Assessment & Plan Note (Signed)
Blood pressure has been under good control.  Same medication regimen.  Follow pressures.   Follow metabolic panel.   

## 2015-07-15 NOTE — Assessment & Plan Note (Signed)
Low carb diet and exercise.  Follow met b and a1c.  

## 2015-07-15 NOTE — Assessment & Plan Note (Signed)
Physical today 07/07/15.  Desires not to pursue mammogram.

## 2015-07-15 NOTE — Assessment & Plan Note (Signed)
Has noticed some chest tightness and sob with exertion.  EKG obtained and revealed SR with RBBB and non specific changes.  Given new symptoms, will have cardiology evaluate with question of need for further cardiac w/up.

## 2015-07-17 DIAGNOSIS — I272 Other secondary pulmonary hypertension: Secondary | ICD-10-CM | POA: Diagnosis not present

## 2015-07-17 DIAGNOSIS — I10 Essential (primary) hypertension: Secondary | ICD-10-CM | POA: Diagnosis not present

## 2015-07-17 DIAGNOSIS — R0602 Shortness of breath: Secondary | ICD-10-CM | POA: Diagnosis not present

## 2015-07-17 DIAGNOSIS — R42 Dizziness and giddiness: Secondary | ICD-10-CM | POA: Diagnosis not present

## 2015-07-17 DIAGNOSIS — E7800 Pure hypercholesterolemia, unspecified: Secondary | ICD-10-CM | POA: Diagnosis not present

## 2015-07-26 ENCOUNTER — Other Ambulatory Visit: Payer: Self-pay | Admitting: Internal Medicine

## 2015-08-07 DIAGNOSIS — R0602 Shortness of breath: Secondary | ICD-10-CM | POA: Diagnosis not present

## 2015-08-14 ENCOUNTER — Other Ambulatory Visit: Payer: Self-pay | Admitting: Internal Medicine

## 2015-08-14 DIAGNOSIS — I272 Other secondary pulmonary hypertension: Secondary | ICD-10-CM | POA: Diagnosis not present

## 2015-08-14 DIAGNOSIS — I10 Essential (primary) hypertension: Secondary | ICD-10-CM | POA: Diagnosis not present

## 2015-08-14 DIAGNOSIS — E7800 Pure hypercholesterolemia, unspecified: Secondary | ICD-10-CM | POA: Diagnosis not present

## 2015-08-16 ENCOUNTER — Other Ambulatory Visit: Payer: Self-pay | Admitting: Internal Medicine

## 2015-08-16 ENCOUNTER — Encounter: Payer: Self-pay | Admitting: Otolaryngology

## 2015-08-30 DIAGNOSIS — M19011 Primary osteoarthritis, right shoulder: Secondary | ICD-10-CM | POA: Diagnosis not present

## 2015-09-21 DIAGNOSIS — H401134 Primary open-angle glaucoma, bilateral, indeterminate stage: Secondary | ICD-10-CM | POA: Diagnosis not present

## 2015-10-28 DIAGNOSIS — M19011 Primary osteoarthritis, right shoulder: Secondary | ICD-10-CM | POA: Diagnosis not present

## 2015-11-01 DIAGNOSIS — F330 Major depressive disorder, recurrent, mild: Secondary | ICD-10-CM | POA: Diagnosis not present

## 2015-11-01 DIAGNOSIS — F0280 Dementia in other diseases classified elsewhere without behavioral disturbance: Secondary | ICD-10-CM | POA: Diagnosis not present

## 2015-12-21 DIAGNOSIS — M19011 Primary osteoarthritis, right shoulder: Secondary | ICD-10-CM | POA: Diagnosis not present

## 2015-12-29 DIAGNOSIS — M19011 Primary osteoarthritis, right shoulder: Secondary | ICD-10-CM | POA: Diagnosis not present

## 2016-01-03 ENCOUNTER — Other Ambulatory Visit: Payer: Self-pay | Admitting: Orthopedic Surgery

## 2016-01-15 ENCOUNTER — Emergency Department (HOSPITAL_COMMUNITY)
Admission: EM | Admit: 2016-01-15 | Discharge: 2016-01-15 | Disposition: A | Discharge status: Home / Self Care | Payer: Medicare Other | Attending: Emergency Medicine | Admitting: Emergency Medicine

## 2016-01-15 ENCOUNTER — Encounter (HOSPITAL_COMMUNITY): Payer: Self-pay | Admitting: Emergency Medicine

## 2016-01-15 ENCOUNTER — Emergency Department (HOSPITAL_COMMUNITY): Payer: Medicare Other

## 2016-01-15 DIAGNOSIS — J3489 Other specified disorders of nose and nasal sinuses: Secondary | ICD-10-CM | POA: Insufficient documentation

## 2016-01-15 DIAGNOSIS — F0390 Unspecified dementia without behavioral disturbance: Secondary | ICD-10-CM | POA: Diagnosis not present

## 2016-01-15 DIAGNOSIS — R51 Headache: Secondary | ICD-10-CM | POA: Diagnosis present

## 2016-01-15 DIAGNOSIS — M1990 Unspecified osteoarthritis, unspecified site: Secondary | ICD-10-CM | POA: Diagnosis not present

## 2016-01-15 DIAGNOSIS — K219 Gastro-esophageal reflux disease without esophagitis: Secondary | ICD-10-CM | POA: Insufficient documentation

## 2016-01-15 DIAGNOSIS — R519 Headache, unspecified: Secondary | ICD-10-CM

## 2016-01-15 DIAGNOSIS — H409 Unspecified glaucoma: Secondary | ICD-10-CM | POA: Diagnosis not present

## 2016-01-15 DIAGNOSIS — E7800 Pure hypercholesterolemia, unspecified: Secondary | ICD-10-CM | POA: Insufficient documentation

## 2016-01-15 DIAGNOSIS — G8929 Other chronic pain: Secondary | ICD-10-CM | POA: Insufficient documentation

## 2016-01-15 DIAGNOSIS — Z8542 Personal history of malignant neoplasm of other parts of uterus: Secondary | ICD-10-CM | POA: Insufficient documentation

## 2016-01-15 DIAGNOSIS — H401133 Primary open-angle glaucoma, bilateral, severe stage: Secondary | ICD-10-CM | POA: Diagnosis not present

## 2016-01-15 DIAGNOSIS — J45909 Unspecified asthma, uncomplicated: Secondary | ICD-10-CM | POA: Diagnosis not present

## 2016-01-15 DIAGNOSIS — Z79899 Other long term (current) drug therapy: Secondary | ICD-10-CM | POA: Insufficient documentation

## 2016-01-15 DIAGNOSIS — G453 Amaurosis fugax: Secondary | ICD-10-CM | POA: Diagnosis not present

## 2016-01-15 DIAGNOSIS — Z7951 Long term (current) use of inhaled steroids: Secondary | ICD-10-CM | POA: Insufficient documentation

## 2016-01-15 LAB — DIFFERENTIAL
Basophils Absolute: 0 10*3/uL (ref 0.0–0.1)
Basophils Relative: 1 %
EOS PCT: 1 %
Eosinophils Absolute: 0.1 10*3/uL (ref 0.0–0.7)
LYMPHS PCT: 31 %
Lymphs Abs: 1.5 10*3/uL (ref 0.7–4.0)
MONO ABS: 0.2 10*3/uL (ref 0.1–1.0)
Monocytes Relative: 4 %
Neutro Abs: 3.1 10*3/uL (ref 1.7–7.7)
Neutrophils Relative %: 63 %

## 2016-01-15 LAB — COMPREHENSIVE METABOLIC PANEL
ALK PHOS: 88 U/L (ref 38–126)
ALT: 16 U/L (ref 14–54)
ANION GAP: 10 (ref 5–15)
AST: 21 U/L (ref 15–41)
Albumin: 4 g/dL (ref 3.5–5.0)
BUN: 15 mg/dL (ref 6–20)
CALCIUM: 9.7 mg/dL (ref 8.9–10.3)
CO2: 23 mmol/L (ref 22–32)
Chloride: 111 mmol/L (ref 101–111)
Creatinine, Ser: 0.91 mg/dL (ref 0.44–1.00)
GFR calc non Af Amer: 55 mL/min — ABNORMAL LOW (ref >60)
Glucose, Bld: 180 mg/dL — ABNORMAL HIGH (ref 65–99)
Potassium: 3.9 mmol/L (ref 3.5–5.1)
SODIUM: 144 mmol/L (ref 135–145)
TOTAL PROTEIN: 6.6 g/dL (ref 6.5–8.1)
Total Bilirubin: 0.3 mg/dL (ref 0.3–1.2)

## 2016-01-15 LAB — CBC
HCT: 39.8 % (ref 36.0–46.0)
Hemoglobin: 12.9 g/dL (ref 12.0–15.0)
MCH: 32.3 pg (ref 26.0–34.0)
MCHC: 32.4 g/dL (ref 30.0–36.0)
MCV: 99.7 fL (ref 78.0–100.0)
PLATELETS: 221 10*3/uL (ref 150–400)
RBC: 3.99 MIL/uL (ref 3.87–5.11)
RDW: 12.2 % (ref 11.5–15.5)
WBC: 4.9 10*3/uL (ref 4.0–10.5)

## 2016-01-15 LAB — I-STAT TROPONIN, ED: Troponin i, poc: 0 ng/mL (ref 0.00–0.08)

## 2016-01-15 LAB — PROTIME-INR
INR: 1.07 (ref 0.00–1.49)
PROTHROMBIN TIME: 14.1 s (ref 11.6–15.2)

## 2016-01-15 LAB — SEDIMENTATION RATE: SED RATE: 8 mm/h (ref 0–22)

## 2016-01-15 LAB — APTT: aPTT: 33 seconds (ref 24–37)

## 2016-01-15 NOTE — ED Notes (Signed)
Pt states she has been having headaches/dizziness/gait disturbances along with vision changes in left eye. This all has been going on for over a week. Pt sent here from eye Md.

## 2016-01-15 NOTE — ED Provider Notes (Signed)
CSN: [id number]     Arrival date & time 01/15/16  1528 History   First MD Initiated Contact with Patient 01/15/16 1707     Chief Complaint  Patient presents with  . Headache  . Eye Problem     (Consider location/radiation/quality/duration/timing/severity/associated sxs/prior Treatment) HPI 82 y.o. female with a hx of macular degeneration, glaucoma, and near complete vision loss in her right eye and partial visual field loss in her left eye presents to the ED referred by her ophthalmologist for evaluation for temporal arteritis. The patient states that she has been having intermittent amaurosis fugax symptoms intermittently over the last few weeks and a three day history of a frontal headache accompanied by sinus congestion. This is what led her to be evaluated by her ophthalmologist today. No fevers, rhinorrhea, sore throat or other URI sx. No known sick contacts. She states that she has a history of chronic sinusitis and has had ENT surgery years prior for this, but does not follow with them regularly. She denies any worsening of her symptoms with mastication or facial movements. She was evaluated by her ophthalmologist today for her vision problems and they recommended she present to the ED for further evaluation and workup for temporal arteritis. She states that with the headache she has had difficulty with ambulation requiring more assistance, feeling more unstable than normal, but with no focal neuro deficits.   Past Medical History  Diagnosis Date  . Cancer [facility])     uterine and questionable vulvar, s/p hysterectomy  . Hypercholesterolemia   . Chronic headaches   . GERD (gastroesophageal reflux disease)   . Glaucoma   . Arthritis   . Right bundle branch block   . Reactive airway disease   . Cystocele   . Rectocele   . Hypertension   . Macular degeneration   . Dementia     able to sign own papers  . Vertigo     none - several yrs  . Wears dentures     full upper and lower    Past Surgical History  Procedure Laterality Date  . Cholecystectomy    . Appendectomy    . Abdominal hysterectomy    . Vulvectomy    . Replacement total knee Left   . Image guided sinus surgery N/A 06/20/2015    Procedure: IMAGE GUIDED SINUS SURGERY;  Surgeon: Clyde Canterbury, MD;  Location: [place];  Service: ENT;  Laterality: N/A;  GAVE DISK TO CE CE  . Sphenoidectomy Bilateral 06/20/2015    Procedure: SPHENOIDECTOMY;  Surgeon: Clyde Canterbury, MD;  Location: [place];  Service: ENT;  Laterality: Bilateral;   Family History  Problem Relation Age of Onset  . Heart disease Mother   . Cancer Father     leukemia  . Heart disease Brother     x2  . Cancer Daughter     breast   Social History  Substance Use Topics  . Smoking status: Never Smoker   . Smokeless tobacco: Never Used  . Alcohol Use: No   OB History    No data available     Review of Systems  Constitutional: Negative for fever, chills, activity change and appetite change.  HENT: Positive for congestion and sinus pressure. Negative for rhinorrhea, sneezing and sore throat.   Eyes: Positive for visual disturbance. Negative for photophobia and pain.  Respiratory: Negative for cough, chest tightness and shortness of breath.   Cardiovascular: Negative for chest pain.  Gastrointestinal: Negative for nausea, vomiting,  abdominal pain and diarrhea.  Genitourinary: Negative for dysuria and difficulty urinating.  Musculoskeletal: Positive for gait problem. Negative for myalgias, back pain, neck pain and neck stiffness.  Skin: Negative for rash.  Neurological: Positive for headaches. Negative for dizziness, seizures, syncope, facial asymmetry, speech difficulty, weakness, light-headedness and numbness.  All other systems reviewed and are negative.     Allergies  Ambien and Codeine  Home Medications   Prior to Admission medications   Medication Sig Start Date End Date Taking? Authorizing Provider   acetaminophen (TYLENOL) 500 MG tablet Take 1,000 mg by mouth 2 (two) times daily.   Yes Historical Provider, MD  Aspirin-Salicylamide-Caffeine (BC HEADACHE POWDER PO) Take 1 packet by mouth daily as needed (pain/headache).   Yes Historical Provider, MD  atorvastatin (LIPITOR) 40 MG tablet TAKE ONE TABLET AT 6 P.M. Patient taking differently: TAKE ONE TABLET BY MOUTH DAILY AT BEDTIME 08/14/15  Yes Einar Pheasant, MD  brimonidine (ALPHAGAN P) 0.1 % SOLN Place 1 drop into both eyes 2 (two) times daily.   Yes Historical Provider, MD  fluticasone (FLONASE) 50 MCG/ACT nasal spray Place 2 sprays into both nostrils daily. Patient taking differently: Place 2 sprays into both nostrils daily as needed (seasonal allergies).  03/10/14  Yes Einar Pheasant, MD  Melatonin 5 MG TABS Take 10 mg by mouth at bedtime.   Yes Historical Provider, MD  memantine (NAMENDA) 10 MG tablet Take 10 mg by mouth 2 (two) times daily.    Yes Chauncey Mann, MD  Multiple Vitamin (MULTIVITAMIN WITH MINERALS) TABS tablet Take 1 tablet by mouth daily.   Yes Historical Provider, MD  Multiple Vitamins-Minerals (PRESERVISION AREDS) TABS Take 1 tablet by mouth daily.    Yes Historical Provider, MD  Omega-3 Fatty Acids (FISH OIL CONCENTRATE PO) Take 1 capsule by mouth daily.    Yes Historical Provider, MD  omeprazole (PRILOSEC) 20 MG capsule TAKE ONE CAPSULE EACH MORNING 08/16/15  Yes Einar Pheasant, MD  oxybutynin (OXYTROL) 3.9 MG/24HR Place 1 patch onto the skin every 3 (three) days.   Yes Historical Provider, MD  sertraline (ZOLOFT) 100 MG tablet Take 150 mg by mouth daily.    Yes Lenor Coffin, PA-C  traMADol (ULTRAM) 50 MG tablet Take 25 mg by mouth 2 (two) times daily.   Yes Historical Provider, MD  travoprost, benzalkonium, (TRAVATAN) 0.004 % ophthalmic solution Place 1 drop into both eyes at bedtime.   Yes Historical Provider, MD  traZODone (DESYREL) 50 MG tablet Take 150 mg by mouth at bedtime.   Yes Historical Provider, MD  vitamin D,  CHOLECALCIFEROL, 400 UNITS tablet Take 400 Units by mouth daily.   Yes Historical Provider, MD  brinzolamide (AZOPT) 1 % ophthalmic suspension Place 1 drop into the left eye 3 (three) times daily. Patient not taking: Reported on 01/15/2016 10/13/13   Melton Alar, PA-C  HYDROcodone-acetaminophen (NORCO/VICODIN) 5-325 MG per tablet Take 1 tablet by mouth every 4 (four) hours as needed for moderate pain. Patient not taking: Reported on 01/15/2016 06/20/15   Clyde Canterbury, MD  latanoprost (XALATAN) 0.005 % ophthalmic solution Place 1 drop into both eyes at bedtime. Patient not taking: Reported on 01/15/2016 10/13/13   Bobby Rumpf [place], PA-C   BP 142/106 mmHg  Pulse 78  Temp(Src) 98.1 F (36.7 C) (Oral)  Resp 18  Ht '5\' 2"'$  (1.575 m)  Wt 68.72 kg  BMI 27.70 kg/m2  SpO2 95% Physical Exam  Constitutional: She is oriented to person, place, and time. She appears well-developed  and well-nourished. No distress.  HENT:  Head: Normocephalic and atraumatic.  Nose: Nose normal.  Mouth/Throat: Oropharynx is clear and moist.  Mild tenderness predominantly over her forehead and b/l temples, slightly wore on the left. No erythema or edema noted.   Eyes: Conjunctivae and EOM are normal. Pupils are equal, round, and reactive to light.  Neck: Normal range of motion. Neck supple.  Cardiovascular: Normal rate, regular rhythm, normal heart sounds and intact distal pulses.   Pulmonary/Chest: Effort normal and breath sounds normal.  Abdominal: Soft. There is no tenderness.  Musculoskeletal: She exhibits no edema or tenderness.  Neurological: She is alert and oriented to person, place, and time. She has normal strength. No cranial nerve deficit or sensory deficit. Coordination normal. GCS eye subscore is 4. GCS verbal subscore is 5. GCS motor subscore is 6.  Intact finger to nose, though with some difficulty due to visual deficits, normal heel to shin.    Skin: Skin is warm and dry. She is not diaphoretic.  Nursing  note and vitals reviewed.   ED Course  Procedures (including critical care time) Labs Review Labs Reviewed  COMPREHENSIVE METABOLIC PANEL - Abnormal; Notable for the following:    Glucose, Bld 180 (*)    GFR calc non Af Amer 55 (*)    All other components within normal limits  PROTIME-INR  APTT  CBC  DIFFERENTIAL  SEDIMENTATION RATE  C-REACTIVE PROTEIN  Rosezena Sensor, ED    Imaging Review Mr Brain Wo Contrast  01/15/2016  CLINICAL DATA:  Pt states she has been having headaches/dizziness/gait disturbances along with vision changes in left eye. This all has been going on for over a week. EXAM: MRI HEAD WITHOUT CONTRAST TECHNIQUE: Multiplanar, multiecho pulse sequences of the brain and surrounding structures were obtained without intravenous contrast. COMPARISON:  CT head 04/22/2015.  MR head 10/11/2013. FINDINGS: No evidence for acute infarction, hemorrhage, mass lesion, or extra-axial fluid. Generalized atrophy. Hydrocephalus ex vacuo. Minor white matter disease, nonspecific. Flow voids are maintained throughout the carotid, basilar, and vertebral arteries. There are no areas of chronic hemorrhage. Pituitary, pineal, and cerebellar tonsils unremarkable. No upper cervical lesions. Visualized calvarium, skull base, and upper cervical osseous structures unremarkable. Scalp and extracranial soft tissues, orbits, and mastoids show no acute process. Significant T1 and T2 hypointense material in the sphenoid sinus, RIGHT division, as identified on CT. This could contribute to the patient's headaches. Correlate clinically. ENT consultation may be warranted. Compared with the prior exam, there is increased atrophy since 2015. IMPRESSION: Chronic changes as described.  No acute intracranial abnormality. Electronically Signed   By: Elsie Stain M.D.   On: 01/15/2016 20:37   I have personally reviewed and evaluated these images and lab results as part of my medical decision-making.   EKG  Interpretation   Date/Time:  Monday January 15 2016 17:16:38 EDT Ventricular Rate:  75 PR Interval:  179 QRS Duration: 147 QT Interval:  427 QTC Calculation: 477 R Axis:   101 Text Interpretation:  Sinus rhythm Atrial premature complex RBBB and LPFB  new from last Otherwise no significant change Confirmed by FLOYD MD,  Reuel Boom ([id number]) on 01/15/2016 5:26:33 PM      MDM  82 y.o. female with extensive ophthalmologic history with glaucoma, macular degeneration, and near complete vision loss in her right eye and partial visual field loss in her left eye presents to the ED referred by her ophthalmologist for evaluation for temporal arteritis. On exam the patient is in NAD noting a  mild-moderate central frontal headache. She has mild tenderness in her temples b/l, slightly worse on the left. She denies any current changes in her vision with it. She is otherwise neurologically intact with no focal deficits. Labs were drawn and returned reassuringly with normal ESR, no leukocytosis, anemia, electrolyte derangement, or elevated troponin or INR. MRI brain was done to further evaluate her symptoms and returned showing no acute abnormality, but did show some significant sinus congestion which was felt to be contributing to her headaches. Do not feel that this is temporal arteritis or acute CVA which has caused her sx. Feel that her headache is likely caused by her sinus congestion, vs a tension headache. She was recommended to follow up closely with her PCP and to discuss ENT referral if her symptoms persist. This plan was discussed with the patient and her husband at the bedside and they stated both understanding and agreement with this plan.    Final diagnoses:  Acute nonintractable headache, unspecified headache type  Amaurosis fugax of left eye        Zenovia Jarred, DO 01/16/16 [place], DO 01/16/16 1326

## 2016-01-17 ENCOUNTER — Inpatient Hospital Stay (HOSPITAL_COMMUNITY): Admission: RE | Admit: 2016-01-17 | Payer: Medicare Other | Source: Ambulatory Visit

## 2016-01-25 ENCOUNTER — Inpatient Hospital Stay: Admit: 2016-01-25 | Payer: Medicare Other | Admitting: Orthopedic Surgery

## 2016-01-25 SURGERY — ARTHROPLASTY, SHOULDER, TOTAL, REVERSE
Anesthesia: Choice | Laterality: Right

## 2016-02-19 DIAGNOSIS — F330 Major depressive disorder, recurrent, mild: Secondary | ICD-10-CM | POA: Diagnosis not present

## 2016-02-19 DIAGNOSIS — F0280 Dementia in other diseases classified elsewhere without behavioral disturbance: Secondary | ICD-10-CM | POA: Diagnosis not present

## 2016-02-27 ENCOUNTER — Other Ambulatory Visit: Payer: Self-pay | Admitting: Internal Medicine

## 2016-02-27 NOTE — Telephone Encounter (Signed)
Last OV 5/16 ok to fill omeprazole?

## 2016-02-27 NOTE — Telephone Encounter (Signed)
Ok to refill omeprazole x 2.  Need to schedule a f/u appt.

## 2016-03-01 ENCOUNTER — Observation Stay (HOSPITAL_COMMUNITY)
Admission: EM | Admit: 2016-03-01 | Discharge: 2016-03-04 | Disposition: A | Discharge status: Home Health | Payer: Medicare Other | Attending: Internal Medicine | Admitting: Internal Medicine

## 2016-03-01 ENCOUNTER — Observation Stay (HOSPITAL_COMMUNITY): Payer: Medicare Other

## 2016-03-01 ENCOUNTER — Encounter (HOSPITAL_COMMUNITY): Payer: Self-pay | Admitting: Emergency Medicine

## 2016-03-01 ENCOUNTER — Emergency Department (HOSPITAL_COMMUNITY): Payer: Medicare Other

## 2016-03-01 DIAGNOSIS — F329 Major depressive disorder, single episode, unspecified: Secondary | ICD-10-CM | POA: Insufficient documentation

## 2016-03-01 DIAGNOSIS — M25511 Pain in right shoulder: Secondary | ICD-10-CM | POA: Insufficient documentation

## 2016-03-01 DIAGNOSIS — R29818 Other symptoms and signs involving the nervous system: Secondary | ICD-10-CM | POA: Diagnosis not present

## 2016-03-01 DIAGNOSIS — I6523 Occlusion and stenosis of bilateral carotid arteries: Secondary | ICD-10-CM | POA: Diagnosis not present

## 2016-03-01 DIAGNOSIS — R52 Pain, unspecified: Secondary | ICD-10-CM

## 2016-03-01 DIAGNOSIS — F0390 Unspecified dementia without behavioral disturbance: Secondary | ICD-10-CM | POA: Diagnosis present

## 2016-03-01 DIAGNOSIS — R531 Weakness: Secondary | ICD-10-CM | POA: Insufficient documentation

## 2016-03-01 DIAGNOSIS — R51 Headache: Secondary | ICD-10-CM | POA: Diagnosis not present

## 2016-03-01 DIAGNOSIS — H8109 Meniere's disease, unspecified ear: Secondary | ICD-10-CM | POA: Diagnosis not present

## 2016-03-01 DIAGNOSIS — R42 Dizziness and giddiness: Secondary | ICD-10-CM

## 2016-03-01 DIAGNOSIS — R2990 Unspecified symptoms and signs involving the nervous system: Secondary | ICD-10-CM

## 2016-03-01 DIAGNOSIS — K219 Gastro-esophageal reflux disease without esophagitis: Secondary | ICD-10-CM | POA: Diagnosis present

## 2016-03-01 DIAGNOSIS — R55 Syncope and collapse: Secondary | ICD-10-CM | POA: Diagnosis not present

## 2016-03-01 DIAGNOSIS — H409 Unspecified glaucoma: Secondary | ICD-10-CM | POA: Diagnosis not present

## 2016-03-01 DIAGNOSIS — I639 Cerebral infarction, unspecified: Secondary | ICD-10-CM

## 2016-03-01 DIAGNOSIS — E785 Hyperlipidemia, unspecified: Secondary | ICD-10-CM | POA: Diagnosis present

## 2016-03-01 LAB — URINALYSIS, ROUTINE W REFLEX MICROSCOPIC
BILIRUBIN URINE: NEGATIVE
GLUCOSE, UA: NEGATIVE mg/dL
HGB URINE DIPSTICK: NEGATIVE
Ketones, ur: NEGATIVE mg/dL
Nitrite: NEGATIVE
Protein, ur: NEGATIVE mg/dL
SPECIFIC GRAVITY, URINE: 1.023 (ref 1.005–1.030)
pH: 5.5 (ref 5.0–8.0)

## 2016-03-01 LAB — BASIC METABOLIC PANEL
Anion gap: 9 (ref 5–15)
BUN: 11 mg/dL (ref 6–20)
CALCIUM: 9.7 mg/dL (ref 8.9–10.3)
CO2: 24 mmol/L (ref 22–32)
CREATININE: 1.08 mg/dL — AB (ref 0.44–1.00)
Chloride: 109 mmol/L (ref 101–111)
GFR calc Af Amer: 52 mL/min — ABNORMAL LOW (ref >60)
GFR calc non Af Amer: 45 mL/min — ABNORMAL LOW (ref >60)
GLUCOSE: 108 mg/dL — AB (ref 65–99)
Potassium: 3.8 mmol/L (ref 3.5–5.1)
Sodium: 142 mmol/L (ref 135–145)

## 2016-03-01 LAB — URINE MICROSCOPIC-ADD ON: RBC / HPF: NONE SEEN RBC/hpf (ref 0–5)

## 2016-03-01 LAB — TROPONIN I

## 2016-03-01 LAB — CBC
HCT: 39.3 % (ref 36.0–46.0)
HEMOGLOBIN: 12.7 g/dL (ref 12.0–15.0)
MCH: 32.2 pg (ref 26.0–34.0)
MCHC: 32.3 g/dL (ref 30.0–36.0)
MCV: 99.5 fL (ref 78.0–100.0)
PLATELETS: 227 10*3/uL (ref 150–400)
RBC: 3.95 MIL/uL (ref 3.87–5.11)
RDW: 12.3 % (ref 11.5–15.5)
WBC: 5.5 10*3/uL (ref 4.0–10.5)

## 2016-03-01 MED ORDER — MEMANTINE HCL 10 MG PO TABS
10.0000 mg | ORAL_TABLET | Freq: Every day | ORAL | Status: DC
  Administered 2016-03-02 – 2016-03-04 (×3): 10 mg via ORAL
  Filled 2016-03-01 (×3): qty 1

## 2016-03-01 MED ORDER — ASPIRIN 300 MG RE SUPP
300.0000 mg | Freq: Every day | RECTAL | Status: DC
Start: 2016-03-01 — End: 2016-03-02

## 2016-03-01 MED ORDER — SERTRALINE HCL 100 MG PO TABS
100.0000 mg | ORAL_TABLET | Freq: Every day | ORAL | Status: DC
  Administered 2016-03-02 – 2016-03-04 (×3): 100 mg via ORAL
  Filled 2016-03-01 (×3): qty 1

## 2016-03-01 MED ORDER — HEPARIN SODIUM (PORCINE) 5000 UNIT/ML IJ SOLN
5000.0000 [IU] | Freq: Three times a day (TID) | INTRAMUSCULAR | Status: DC
  Administered 2016-03-01 – 2016-03-04 (×8): 5000 [IU] via SUBCUTANEOUS
  Filled 2016-03-01 (×8): qty 1

## 2016-03-01 MED ORDER — PANTOPRAZOLE SODIUM 40 MG PO TBEC
40.0000 mg | DELAYED_RELEASE_TABLET | Freq: Every day | ORAL | Status: DC
  Administered 2016-03-01 – 2016-03-04 (×4): 40 mg via ORAL
  Filled 2016-03-01 (×4): qty 1

## 2016-03-01 MED ORDER — MECLIZINE HCL 25 MG PO TABS
25.0000 mg | ORAL_TABLET | Freq: Once | ORAL | Status: AC
  Administered 2016-03-01: 25 mg via ORAL
  Filled 2016-03-01: qty 1

## 2016-03-01 MED ORDER — ASPIRIN 325 MG PO TABS
325.0000 mg | ORAL_TABLET | Freq: Every day | ORAL | Status: DC
  Administered 2016-03-01 – 2016-03-03 (×3): 325 mg via ORAL
  Filled 2016-03-01 (×3): qty 1

## 2016-03-01 MED ORDER — TRAZODONE HCL 50 MG PO TABS: 150.0000 mg | ORAL_TABLET | Freq: Every day | ORAL | Status: DC

## 2016-03-01 MED ORDER — STROKE: EARLY STAGES OF RECOVERY BOOK
Freq: Once | Status: AC
  Administered 2016-03-01: 22:00:00
  Filled 2016-03-01: qty 1

## 2016-03-01 MED ORDER — ROSUVASTATIN CALCIUM 20 MG PO TABS
20.0000 mg | ORAL_TABLET | Freq: Every day | ORAL | Status: DC
  Administered 2016-03-02 – 2016-03-03 (×2): 20 mg via ORAL
  Filled 2016-03-01 (×2): qty 1

## 2016-03-01 MED ORDER — SODIUM CHLORIDE 0.9 % IV BOLUS (SEPSIS)
500.0000 mL | Freq: Once | INTRAVENOUS | Status: AC
  Administered 2016-03-01: 500 mL via INTRAVENOUS

## 2016-03-01 MED ORDER — SENNOSIDES-DOCUSATE SODIUM 8.6-50 MG PO TABS: 1.0000 | ORAL_TABLET | Freq: Every evening | ORAL | Status: DC | PRN

## 2016-03-01 MED ORDER — TRAZODONE HCL 50 MG PO TABS
150.0000 mg | ORAL_TABLET | Freq: Every day | ORAL | Status: DC
  Administered 2016-03-02 – 2016-03-03 (×3): 150 mg via ORAL
  Filled 2016-03-01 (×3): qty 1

## 2016-03-01 NOTE — ED Notes (Signed)
Attempted report 

## 2016-03-01 NOTE — ED Provider Notes (Signed)
CSN: [id number]     Arrival date & time 03/01/16  1143 History   First MD Initiated Contact with Patient 03/01/16 1251     Chief Complaint  Patient presents with  . Headache  . Dizziness     (Consider location/radiation/quality/duration/timing/severity/associated sxs/prior Treatment) HPI Comments: Patient presents today with complaints of dizziness, headache, and near syncope.  She states that the headache has been constant for the past 2 weeks.  Headache is a frontal headache and also located in the area of the maxillary sinuses.  She reports that the headache is unchanged from onset.  She states that this morning she was sitting at the table after eating breakfast and began to feel dizzy and lightheaded.  She states that her vision then went completely black.  Daughter states that she was then found to be slumped over in the chair, but did not actually fall.  Patient denies actual LOC.  Patient denies any history of Vertigo.  She denies fever, chills, nausea, vomiting, chest pain, SOB, neck stiffness, or any other symptoms.  No history of Vertigo.  The history is provided by the patient.    Past Medical History  Diagnosis Date  . Glaucoma    History reviewed. No pertinent past surgical history. No family history on file. Social History  Substance Use Topics  . Smoking status: Never Smoker   . Smokeless tobacco: None  . Alcohol Use: None   OB History    No data available     Review of Systems  All other systems reviewed and are negative.     Allergies  Review of patient's allergies indicates no known allergies.  Home Medications   Prior to Admission medications   Medication Sig Start Date End Date Taking? Authorizing Provider  ATORVASTATIN CALCIUM PO Take 40 mg by mouth daily.   Yes Historical Provider, MD  memantine (NAMENDA) 5 MG tablet Take 10 mg by mouth daily.   Yes Historical Provider, MD  SERTRALINE HCL PO Take 100 mg by mouth daily.   Yes Historical Provider,  MD  traZODone (DESYREL) 150 MG tablet Take 150 mg by mouth at bedtime.   Yes Historical Provider, MD  omeprazole (PRILOSEC) 20 MG capsule Take 20 mg by mouth daily.    Historical Provider, MD   BP 130/105 mmHg  Pulse 72  Temp(Src) 98.5 F (36.9 C) (Oral)  Resp 17  SpO2 96% Physical Exam  Constitutional: She appears well-developed and well-nourished.  HENT:  Head: Normocephalic and atraumatic.  Mouth/Throat: Oropharynx is clear and moist.  Eyes: Pupils are equal, round, and reactive to light.  Neck: Normal range of motion. Neck supple.  Cardiovascular: Normal rate, regular rhythm and normal heart sounds.   Pulmonary/Chest: Effort normal and breath sounds normal.  Musculoskeletal: Normal range of motion.  Neurological: She is alert. She has normal strength. No cranial nerve deficit or sensory deficit.  Attempted to ambulate patient and she was unable to.  Became very dizzy when standing.  Skin: Skin is warm and dry.  Psychiatric: She has a normal mood and affect.  Nursing note and vitals reviewed.   ED Course  Procedures (including critical care time) Labs Review Labs Reviewed  BASIC METABOLIC PANEL - Abnormal; Notable for the following:    Glucose, Bld 108 (*)    Creatinine, Ser 1.08 (*)    GFR calc non Af Amer 45 (*)    GFR calc Af Amer 52 (*)    All other components within normal limits  CBC  URINALYSIS, ROUTINE W REFLEX MICROSCOPIC (NOT AT [facility])  CBG MONITORING, ED    Imaging Review No results found. I have personally reviewed and evaluated these images and lab results as part of my medical decision-making.   EKG Interpretation None     5:08 PM Reassessed patient.  Patient reports that her symptoms have improved.  However, patient attempted to ambulate and had ataxia. MDM   Final diagnoses:  None   Patient presents today with complaints of dizziness, headache, and near syncope.  No focal neurological deficits on exam.  CT head negative.  Labs unremarkable.   Patient given Meclizine in the ED and continued to have ataxia with ambulation.  Patient admitted to [facility] for further monitoring and management.       Hyman Bible, PA-C 03/04/16 [id number]  Isla Pence, MD 03/04/16 [phone]

## 2016-03-01 NOTE — H&P (Addendum)
History and Physical    [name] [id number] DOB: 08-03-1926 DOA: 03/01/2016  PCP: No primary care provider on file. (Confirm with patient/family/NH records and if not entered, this has to be entered at [facility] point of entry) Patient coming from: Home  Chief Complaint: Vertigo  HPI: [name] is a 82 y.o. female with medical history hyperlipidemia, dementia, and depression presenting with vertigo which occurred sudden onset earlier today. Much of the history is obtained by the daughter as patient has dementia. Daughter reports that today while she was eating patient slumped over almost passed out. Patient every time she stands has vertigo. The problem has been persistent and is not getting any better as a result patient presented to the hospital for further evaluation recommendations.  ED Course: Obtain CT scan which was negative for central nervous system cause  Review of Systems: As per HPI otherwise 10 point review of systems negative.  Unacceptable ROS statements: "10 systems reviewed," "Extensive" (without elaboration).  Acceptable ROS statements: "All others negative," "All others reviewed and are negative," and "All others unremarkable," with at [place] documented Can't double dip - if using for HPI can't use for ROS  Past Medical History  Diagnosis Date  . Glaucoma     History reviewed. No pertinent past surgical history.   reports that she has never smoked. She does not have any smokeless tobacco history on file. Her alcohol and drug histories are not on file.  No Known Allergies  No family history on file. Unacceptable: Noncontributory, unremarkable, or negative. Acceptable: Family history reviewed and not pertinent (If you reviewed it)  Prior to Admission medications   Medication Sig Start Date End Date Taking? Authorizing Provider  ATORVASTATIN CALCIUM PO Take 40 mg by mouth daily.   Yes Historical Provider, MD  memantine (NAMENDA) 5 MG tablet Take 10 mg  by mouth daily.   Yes Historical Provider, MD  SERTRALINE HCL PO Take 100 mg by mouth daily.   Yes Historical Provider, MD  traZODone (DESYREL) 150 MG tablet Take 150 mg by mouth at bedtime.   Yes Historical Provider, MD  omeprazole (PRILOSEC) 20 MG capsule Take 20 mg by mouth daily.    Historical Provider, MD    Physical Exam: Filed Vitals:   03/01/16 1530 03/01/16 1531 03/01/16 1600 03/01/16 1700  BP: 128/88  161/79 180/73  Pulse: 78  78 75  Temp:  98.3 F (36.8 C)    TempSrc:      Resp: 21  17 25   SpO2: 95%  94% 96%      Constitutional: NAD, calm, comfortable Filed Vitals:   03/01/16 1530 03/01/16 1531 03/01/16 1600 03/01/16 1700  BP: 128/88  161/79 180/73  Pulse: 78  78 75  Temp:  98.3 F (36.8 C)    TempSrc:      Resp: 21  17 25   SpO2: 95%  94% 96%   Eyes: PERRL, lids and conjunctivae normal ENMT: Mucous membranes are moist. Posterior pharynx clear of any exudate or lesions Neck: normal, supple, no masses, no thyromegaly Respiratory: clear to auscultation bilaterally, no wheezing, no crackles. Normal respiratory effort. No accessory muscle use.  Cardiovascular: Regular rate and rhythm, no murmurs / rubs / gallops. No extremity edema. 2+ pedal pulses. No carotid bruits.  Abdomen: no tenderness, no masses palpated. No hepatosplenomegaly. Bowel sounds positive.  Musculoskeletal: no clubbing / cyanosis. No joint deformity upper and lower extremities. Good ROM, no contractures. Normal muscle tone.  Skin: no rashes, lesions,  ulcers. No induration Neurologic: moves extremities equally, grip equal BL Psychiatric: Normal judgment and insight. Alert and awake    Labs on Admission: I have personally reviewed following labs and imaging studies  CBC:  Recent Labs Lab 03/01/16 1202  WBC 5.5  HGB 12.7  HCT 39.3  MCV 99.5  PLT [id number]   Basic Metabolic Panel:  Recent Labs Lab 03/01/16 1202  NA 142  K 3.8  CL 109  CO2 24  GLUCOSE 108*  BUN 11  CREATININE 1.08*    CALCIUM 9.7   GFR: CrCl cannot be calculated (Unknown ideal weight.). Liver Function Tests: No results for input(s): AST, ALT, ALKPHOS, BILITOT, PROT, ALBUMIN in the last 168 hours. No results for input(s): LIPASE, AMYLASE in the last 168 hours. No results for input(s): AMMONIA in the last 168 hours. Coagulation Profile: No results for input(s): INR, PROTIME in the last 168 hours. Cardiac Enzymes:  Recent Labs Lab 03/01/16 1202  TROPONINI <0.03   BNP (last 3 results) No results for input(s): PROBNP in the last 8760 hours. HbA1C: No results for input(s): HGBA1C in the last 72 hours. CBG: No results for input(s): GLUCAP in the last 168 hours. Lipid Profile: No results for input(s): CHOL, HDL, LDLCALC, TRIG, CHOLHDL, LDLDIRECT in the last 72 hours. Thyroid Function Tests: No results for input(s): TSH, T4TOTAL, FREET4, T3FREE, THYROIDAB in the last 72 hours. Anemia Panel: No results for input(s): VITAMINB12, FOLATE, FERRITIN, TIBC, IRON, RETICCTPCT in the last 72 hours. Urine analysis:    Component Value Date/Time   COLORURINE YELLOW 03/01/2016 [place] 03/01/2016 1457   LABSPEC 1.023 03/01/2016 1457   PHURINE 5.5 03/01/2016 1457   GLUCOSEU NEGATIVE 03/01/2016 1457   HGBUR NEGATIVE 03/01/2016 1457   BILIRUBINUR NEGATIVE 03/01/2016 1457   KETONESUR NEGATIVE 03/01/2016 1457   PROTEINUR NEGATIVE 03/01/2016 1457   NITRITE NEGATIVE 03/01/2016 1457   LEUKOCYTESUR SMALL* 03/01/2016 1457   )No results found for this or any previous visit (from the past 240 hour(s)).   Radiological Exams on Admission: Ct Head Wo Contrast  03/01/2016  CLINICAL DATA:  Headache 2 weeks with dizziness today. EXAM: CT HEAD WITHOUT CONTRAST TECHNIQUE: Contiguous axial images were obtained from the base of the skull through the vertex without intravenous contrast. COMPARISON:  None. FINDINGS: Ventricles, cisterns and other CSF spaces are within normal. There is mild chronic ischemic  microvascular disease. There is no mass, mass effect, shift of midline structures or acute hemorrhage. No evidence of acute infarction. Remaining bones and soft tissues are within normal. IMPRESSION: No acute intracranial findings. Mild chronic ischemic microvascular disease. Electronically Signed   By: Marin Olp M.D.   On: 03/01/2016 15:19    EKG: Independently reviewed. Sinus rhythm  Assessment/Plan Active Problems:   Vertigo  -We'll plan on ruling out central nervous system cause for vertigo. We'll continue fall precautions. Obtain echocardiogram, carotid Dopplers, MRI of brain. Recommended ED consult neurology for vertigo.  DVT prophylaxis: Heparin Code Status: full Family Communication: d/c patient and daughter Disposition Plan: Pending work up [employer] called: Neurology Admission status: obs   Velvet Bathe MD [facility] Pager [phone]  If 7PM-7AM, please contact night-coverage [email or web address] Password [email or web address]  03/01/2016, 5:26 PM    Addendum Dementia - continue home medication regimen  HTN - Currently not well controlled but will not bring down blood pressure until stroke is ruled out  Hyperlipidemia continue statin.

## 2016-03-01 NOTE — ED Notes (Signed)
H/a x 2 weeks and  Today got dizzy and felt like she was going to pass out called ems this am and they came out to check  For stoke and they reccomended she come to er

## 2016-03-01 NOTE — ED Notes (Addendum)
Pt was assisted to standing position to then observe gait and potential dizziness, weakness. Tech assisted in front of pt while daughter was behind. Pt kept looking down while walking and swayed front to back, left to right; very unsteady gait. Pt kept reaching out to catch herself. Pt walked approx 10 steps before turning around to get back to bed. Once back in bed pt sat down and stated "I need to get rid of this dizziness".

## 2016-03-01 NOTE — ED Notes (Signed)
Pt returned from CT °

## 2016-03-01 NOTE — ED Notes (Signed)
While preforming orthostatics pt became very dizzy and stated the room was spinning.  Nurse called into room and pt noted to be hypertensive.  Pt now back in bed.  Pt was unable to stand.

## 2016-03-02 ENCOUNTER — Observation Stay (HOSPITAL_COMMUNITY): Payer: Medicare Other

## 2016-03-02 ENCOUNTER — Observation Stay (HOSPITAL_BASED_OUTPATIENT_CLINIC_OR_DEPARTMENT_OTHER): Payer: Medicare Other

## 2016-03-02 DIAGNOSIS — K219 Gastro-esophageal reflux disease without esophagitis: Secondary | ICD-10-CM | POA: Diagnosis not present

## 2016-03-02 DIAGNOSIS — F0390 Unspecified dementia without behavioral disturbance: Secondary | ICD-10-CM | POA: Diagnosis present

## 2016-03-02 DIAGNOSIS — I6523 Occlusion and stenosis of bilateral carotid arteries: Secondary | ICD-10-CM | POA: Diagnosis not present

## 2016-03-02 DIAGNOSIS — E785 Hyperlipidemia, unspecified: Secondary | ICD-10-CM | POA: Diagnosis present

## 2016-03-02 DIAGNOSIS — G459 Transient cerebral ischemic attack, unspecified: Secondary | ICD-10-CM

## 2016-03-02 DIAGNOSIS — R42 Dizziness and giddiness: Secondary | ICD-10-CM

## 2016-03-02 DIAGNOSIS — I10 Essential (primary) hypertension: Secondary | ICD-10-CM | POA: Insufficient documentation

## 2016-03-02 LAB — BASIC METABOLIC PANEL
ANION GAP: 7 (ref 5–15)
BUN: 11 mg/dL (ref 6–20)
CALCIUM: 9.1 mg/dL (ref 8.9–10.3)
CO2: 24 mmol/L (ref 22–32)
Chloride: 110 mmol/L (ref 101–111)
Creatinine, Ser: 0.79 mg/dL (ref 0.44–1.00)
Glucose, Bld: 99 mg/dL (ref 65–99)
POTASSIUM: 3.4 mmol/L — AB (ref 3.5–5.1)
Sodium: 141 mmol/L (ref 135–145)

## 2016-03-02 LAB — LIPID PANEL
Cholesterol: 132 mg/dL (ref 0–200)
HDL: 69 mg/dL (ref >40)
LDL Cholesterol: 50 mg/dL (ref 0–99)
Total CHOL/HDL Ratio: 1.9 RATIO
Triglycerides: 63 mg/dL (ref <150)
VLDL: 13 mg/dL (ref 0–40)

## 2016-03-02 LAB — CBC
HCT: 35.4 % — ABNORMAL LOW (ref 36.0–46.0)
Hemoglobin: 11.5 g/dL — ABNORMAL LOW (ref 12.0–15.0)
MCH: 32.2 pg (ref 26.0–34.0)
MCHC: 32.5 g/dL (ref 30.0–36.0)
MCV: 99.2 fL (ref 78.0–100.0)
PLATELETS: 200 10*3/uL (ref 150–400)
RBC: 3.57 MIL/uL — AB (ref 3.87–5.11)
RDW: 12.3 % (ref 11.5–15.5)
WBC: 5.9 10*3/uL (ref 4.0–10.5)

## 2016-03-02 LAB — ECHOCARDIOGRAM COMPLETE
Height: 63 in
WEIGHTICAEL: 2446.4 [oz_av]

## 2016-03-02 MED ORDER — TRAMADOL HCL 50 MG PO TABS
50.0000 mg | ORAL_TABLET | Freq: Once | ORAL | Status: AC
Start: 2016-03-02 — End: 2016-03-02
  Administered 2016-03-02: 50 mg via ORAL
  Filled 2016-03-02: qty 1

## 2016-03-02 MED ORDER — MECLIZINE HCL 12.5 MG PO TABS
12.5000 mg | ORAL_TABLET | Freq: Three times a day (TID) | ORAL | Status: DC
  Filled 2016-03-02: qty 1

## 2016-03-02 MED ORDER — CEFUROXIME AXETIL 500 MG PO TABS
500.0000 mg | ORAL_TABLET | Freq: Two times a day (BID) | ORAL | Status: DC
  Administered 2016-03-02 – 2016-03-04 (×4): 500 mg via ORAL
  Filled 2016-03-02 (×6): qty 1

## 2016-03-02 MED ORDER — LORAZEPAM 2 MG/ML IJ SOLN
1.0000 mg | Freq: Once | INTRAMUSCULAR | Status: AC
  Administered 2016-03-02: 1 mg via INTRAVENOUS
  Filled 2016-03-02: qty 1

## 2016-03-02 MED ORDER — MECLIZINE HCL 25 MG PO TABS
25.0000 mg | ORAL_TABLET | Freq: Three times a day (TID) | ORAL | Status: DC
  Administered 2016-03-02 – 2016-03-04 (×6): 25 mg via ORAL
  Filled 2016-03-02 (×8): qty 1

## 2016-03-02 MED ORDER — ACETAMINOPHEN 325 MG PO TABS
650.0000 mg | ORAL_TABLET | ORAL | Status: DC | PRN
  Administered 2016-03-02 – 2016-03-04 (×3): 650 mg via ORAL
  Filled 2016-03-02 (×3): qty 2

## 2016-03-02 MED ORDER — GI COCKTAIL ~~LOC~~
30.0000 mL | Freq: Once | ORAL | Status: DC
  Filled 2016-03-02: qty 30

## 2016-03-02 NOTE — Progress Notes (Signed)
*  PRELIMINARY RESULTS* Vascular Ultrasound Carotid Duplex (Doppler) has been completed.  Preliminary findings: Right 1-39% ICA stenosis. Left 40-59% ICA stenosis.  Antegrade vertebral flow.   Landry Mellow, RDMS, RVT  03/02/2016, 10:16 AM

## 2016-03-02 NOTE — Plan of Care (Signed)
Problem: Nutrition: Goal: Risk of aspiration will decrease Outcome: Progressing Passed swallowing screen

## 2016-03-02 NOTE — Evaluation (Addendum)
Physical Therapy Evaluation Patient Details Name: [name] MRN: [id number] DOB: 29-Nov-1925 Today's Date: 03/02/2016   History of Present Illness  Patient is 82 year old female with a hyperlipidemia, dementia, depression presented with vertigo which occurred suddenly earlier on the day of admission. Difficult to obtain history from the patient due to dementia. Per daughter she was eating when patient slumped over and was passed out. Every time patient stood up she had vertigo. CT head in the ED was negative for stroke.  Clinical Impression  Pt admitted with above diagnosis. Pt currently with functional limitations due to the deficits listed below (see PT Problem List). Pt with vertigo therefore did a lot of testing to try and determine the issue.  Treated with canalith repositioning maneuver for right BPPV with pt having better sitting balance at end of treatment however still mod assist for sit to stand with posterior lean.  Will need to continue treatment to address vestibular issues.  Will need to be seen again on Monday.  Text paged MD as pt would benefit from SNF for vestibular therapy as she is very unbalanced and needs further vestibular treatment.  Complicated assessment due to pt blindness and she has Antivert at 0845 this am.  Took orthostatics as below.  Does not appear to be orthostatic. Will follow acutely.  Pt will benefit from skilled PT to increase their independence and safety with mobility to allow discharge to the venue listed below.      Follow Up Recommendations Supervision/Assistance - 24 hour;SNF    Equipment Recommendations  Other (comment) (TBA)    Recommendations for Other Services       Precautions / Restrictions Precautions Precautions: Fall Restrictions Weight Bearing Restrictions: No      Mobility  Bed Mobility Overal bed mobility: Needs Assistance Bed Mobility: Rolling;Sidelying to Sit Rolling: Mod assist Sidelying to sit: Mod assist       General  bed mobility comments: Pt needed assist to come to sitting as well as assist to balance once in sitting.  Transfers Overall transfer level: Needs assistance Equipment used: 2 person hand held assist Transfers: Sit to/from Stand Sit to Stand: Mod assist         General transfer comment: Very unsteady on feet.  Could not stand without assist.  posterior lean significant.  Ambulation/Gait                Stairs            Wheelchair Mobility    Modified Rankin (Stroke Patients Only)       Balance Overall balance assessment: Needs assistance Sitting-balance support: Feet supported;Bilateral upper extremity supported Sitting balance-Leahy Scale: Poor Sitting balance - Comments: Needed mod assist to maintain sitting EOB initially leaning posteriorly and to left.  After treatment for BPPV pt could sit with supervision.    Postural control: Posterior lean;Left lateral lean Standing balance support: Bilateral upper extremity supported;During functional activity Standing balance-Leahy Scale: Poor Standing balance comment: mod assist needed for standing with significant posterior lean.                             Pertinent Vitals/Pain Pain Assessment: Faces Faces Pain Scale: Hurts little more Pain Location: shoulders Pain Descriptors / Indicators: Aching;Sore Pain Intervention(s): Limited activity within patient's tolerance;Monitored during session;Repositioned  Orthostatic BPs  Supine 143/55, 80 bpm  Sitting 160/65, 81 bpm  Standing 159/76, 93 bpm  Standing after 3 min Pt did not  tolerate      Home Living Family/patient expects to be discharged to:: Private residence Living Arrangements: Spouse/significant other Available Help at Discharge: Family;Available PRN/intermittently (husband elderly as well. ) Type of Home: House Home Access: Stairs to enter Entrance Stairs-Rails: Right Entrance Stairs-Number of Steps: 2 steps and then 1 step Home  Layout: One level Home Equipment: Cane - single point;Walker - 4 wheels;Bedside commode;Tub bench;Grab bars - tub/shower      Prior Function Level of Independence: Needs assistance   Gait / Transfers Assistance Needed: used rollator at all times.  ADL's / Homemaking Assistance Needed: B/D on her own.  Husband did grocery shopping and cooking and cleaning  Comments: Pt could be alone for husband to go to store.      Hand Dominance        Extremity/Trunk Assessment   Upper Extremity Assessment: Defer to OT evaluation           Lower Extremity Assessment: Generalized weakness      Cervical / Trunk Assessment: Normal  Communication   Communication: No difficulties  Cognition Arousal/Alertness: Awake/alert Behavior During Therapy: Anxious Overall Cognitive Status: Impaired/Different from baseline Area of Impairment: Problem solving     Memory: Decreased short-term memory       Problem Solving: Slow processing;Decreased initiation;Difficulty sequencing;Requires verbal cues;Requires tactile cues      General Comments General comments (skin integrity, edema, etc.): Tested pt for BPPV with difficulty due to pts visual problems or due to Antivert was given this am.  Pt appeared to have symptoms for right BPPV therefore treated with canalith repositioning maneuver.  After treatment pts balance at EOB was better with pt able to sit EOB by herself and initially pt was leaning signircantly posterior and to left.  Also tested positive for right hypofunction via head thrust test.     Exercises        Assessment/Plan    PT Assessment Patient needs continued PT services  PT Diagnosis Generalized weakness (dizziness)   PT Problem List Decreased activity tolerance;Decreased balance;Decreased mobility;Decreased knowledge of use of DME;Decreased safety awareness;Decreased knowledge of precautions (dizziness)  PT Treatment Interventions DME instruction;Gait training;Functional  mobility training;Therapeutic activities;Therapeutic exercise;Balance training;Patient/family education (vestibular rehab)   PT Goals (Current goals can be found in the Care Plan section) Acute Rehab PT Goals Patient Stated Goal: to get better PT Goal Formulation: With patient Time For Goal Achievement: 03/16/16 Potential to Achieve Goals: Good    Frequency Min 3X/week   Barriers to discharge Decreased caregiver support      Co-evaluation               End of Session   Activity Tolerance: Patient limited by fatigue (limited by dizziness) Patient left: in bed;with call bell/phone within reach;with bed alarm set;with family/visitor present Nurse Communication: Mobility status    Functional Assessment Tool Used: clinical judgment Functional Limitation: Mobility: Walking and moving around Mobility: Walking and Moving Around Current Status [id number]): At least 60 percent but less than 80 percent impaired, limited or restricted Mobility: Walking and Moving Around Goal Status [phone]): At least 20 percent but less than 40 percent impaired, limited or restricted    Time: 1142-1216 PT Time Calculation (min) (ACUTE ONLY): 34 min   Charges:   PT Evaluation $PT Eval Moderate Complexity: 1 Procedure PT Treatments $Canalith Rep Proc: 8-22 mins   PT G Codes:   PT G-Codes **NOT FOR INPATIENT CLASS** Functional Assessment Tool Used: clinical judgment Functional Limitation: Mobility: Walking and moving around Mobility:  Walking and Moving Around Current Status [phone]): At least 60 percent but less than 80 percent impaired, limited or restricted Mobility: Walking and Moving Around Goal Status [phone]): At least 20 percent but less than 40 percent impaired, limited or restricted    Irwin Brakeman F 03/02/2016, 1:30 PM [facility] Rehabilitation [phone] (pager)

## 2016-03-02 NOTE — Progress Notes (Signed)
[facility]                                                                              Patient Demographics  [name], is a 82 y.o. female, DOB - Feb 12, 1926, [id number]  Admit date - 03/01/2016   Admitting Physician Velvet Bathe, MD  Outpatient Primary MD for the patient is No primary care provider on file.  Outpatient specialists:   LOS -   days    Chief Complaint  Patient presents with  . Headache  . Dizziness       Brief summary   Patient is 82 year old female with a hyperlipidemia, dementia, depression presented with vertigo which occurred suddenly earlier on the day of admission. Difficult to obtain history from the patient due to dementia. Per daughter she was eating when patient slumped over and was passed out. Every time patient stood up she had vertigo. CT head in the ED was negative for stroke.   Assessment & Plan    Principal Problem:   Sudden Vertigo:  - MRI of the brain negative for acute infarct, atrophy with mild chronic microvascular ischemia - MRA with the bilateral posterior cerebral artery stenosis - Carotid Doppler showed right 1-39% ICA stenosis, left 40-59% ICA stenosis - Per patient's daughter, she has been having headache and sinus issues, placed on Ceftin - Placed on scheduled meclizine, PT vestibular evaluation, orthostatic vitals - Continue aspirin, complete stroke workup  Active Problems:   Hyperlipidemia - Continue statin    GERD (gastroesophageal reflux disease) - Continue PPI    Dementia - Continue Namenda  Code Status: Full CODE STATUS   DVT Prophylaxis:  heparin    Family Communication: Discussed in detail with the patient, all imaging results, lab results explained to the patient's daughter on phone   Disposition Plan: Hopefully tomorrow if improving  Time Spent in minutes 25 minutes  Procedures:  MRI, MRA   Consultants:   None   Antimicrobials:   None    Medications  Scheduled  Meds: . aspirin  300 mg Rectal Daily   Or  . aspirin  325 mg Oral Daily  . heparin  5,000 Units Subcutaneous Q8H  . meclizine  12.5 mg Oral TID  . memantine  10 mg Oral Daily  . pantoprazole  40 mg Oral Daily  . rosuvastatin  20 mg Oral q1800  . sertraline  100 mg Oral Daily  . traZODone  150 mg Oral QHS   Continuous Infusions:  PRN Meds:.acetaminophen, senna-docusate   Antibiotics   Anti-infectives    None        Subjective:   [name] was seen and examined today.  Still feeling dizzy and vertigo. Patient denies chest pain, shortness of breath, abdominal pain, N/V/D/C, new weakness, numbess, tingling. No acute events overnight.    Objective:   Filed Vitals:   03/02/16 0423 03/02/16 0447 03/02/16 0635 03/02/16 1041  BP: 148/65 153/74 127/65 157/67  Pulse: 71 71 72 74  Temp: 98.2 F (36.8 C) 98 F (36.7 C) 97.5 F (36.4 C) 98.3 F (36.8 C)  TempSrc: Oral Oral Oral Oral  Resp: 18 18 16  20  Height:      Weight:      SpO2: 95% 94% 95% 96%    Intake/Output Summary (Last 24 hours) at 03/02/16 1126 Last data filed at 03/02/16 0849  Gross per 24 hour  Intake    500 ml  Output   1050 ml  Net   -550 ml     Wt Readings from Last 3 Encounters:  03/01/16 69.355 kg (152 lb 14.4 oz)     Exam  General: Alert and oriented x 3, NAD  HEENT:  PERRLA, EOMI, Anicteric Sclera, mucous membranes moist.   Neck: Supple, no JVD, no masses  Cardiovascular: S1 S2 auscultated, no rubs, murmurs or gallops. Regular rate and rhythm.  Respiratory: Clear to auscultation bilaterally, no wheezing, rales or rhonchi  Gastrointestinal: Soft, nontender, nondistended, + bowel sounds  Ext: no cyanosis clubbing or edema  Neuro: AAOx3, Cr N's II- XII. Strength 5/5 upper and lower extremities bilaterally  Skin: No rashes  Psych: Normal affect and demeanor, alert and oriented x3    Data Reviewed:  I have personally reviewed following labs and imaging studies  Micro  Results No results found for this or any previous visit (from the past 240 hour(s)).  Radiology Reports Dg Chest 2 View  03/01/2016  CLINICAL DATA:  Recent stroke-like episode EXAM: CHEST  2 VIEW COMPARISON:  None. FINDINGS: The heart size and mediastinal contours are within normal limits. Both lungs are clear. The visualized skeletal structures are unremarkable. IMPRESSION: No active cardiopulmonary disease. Electronically Signed   By: Inez Catalina M.D.   On: 03/01/2016 21:59   Ct Head Wo Contrast  03/01/2016  CLINICAL DATA:  Headache 2 weeks with dizziness today. EXAM: CT HEAD WITHOUT CONTRAST TECHNIQUE: Contiguous axial images were obtained from the base of the skull through the vertex without intravenous contrast. COMPARISON:  None. FINDINGS: Ventricles, cisterns and other CSF spaces are within normal. There is mild chronic ischemic microvascular disease. There is no mass, mass effect, shift of midline structures or acute hemorrhage. No evidence of acute infarction. Remaining bones and soft tissues are within normal. IMPRESSION: No acute intracranial findings. Mild chronic ischemic microvascular disease. Electronically Signed   By: Marin Olp M.D.   On: 03/01/2016 15:19   Mr Brain Wo Contrast  03/02/2016  CLINICAL DATA:  Vertigo EXAM: MRI HEAD WITHOUT CONTRAST MRA HEAD WITHOUT CONTRAST TECHNIQUE: Multiplanar, multiecho pulse sequences of the brain and surrounding structures were obtained without intravenous contrast. Angiographic images of the head were obtained using MRA technique without contrast. COMPARISON:  CT head 03/01/2016 FINDINGS: MRI HEAD FINDINGS Generalized atrophy. Ventricular enlargement consistent with atrophy. Negative for acute infarct. Mild chronic white matter changes bilaterally. Mild chronic changes in the basilar. These are most consistent with chronic microvascular ischemia. Negative for intracranial hemorrhage or fluid collection Negative for mass or edema. Mild mucosal  edema paranasal sinuses. Bilateral lens replacement. No orbital mass. Image quality degraded by motion. MRA HEAD FINDINGS Image quality degraded by motion. Both vertebral arteries patent to the basilar. Fetal origin right posterior cerebral artery. Mild stenosis mid right posterior cerebral artery. Left posterior cerebral artery patent with mild stenosis distally. Superior cerebellar arteries patent bilaterally. PICA patent bilaterally. Irregularity of the cavernous carotid artery bilaterally with decreased signal bilaterally. This could be due to stenosis or artifact due to tortuosity. Anterior and middle cerebral arteries patent bilaterally without significant stenosis. Hypoplastic right A1 segment. Both anterior cerebral arteries supplied from the left. Negative for vascular malformation or aneurysm. IMPRESSION: Image quality  degraded by motion Negative for acute infarct Atrophy with mild chronic microvascular ischemia Bilateral posterior cerebral artery stenoses. Probable stenosis of the cavernous carotid bilaterally, difficult to quantitate due to tortuosity and motion. Electronically Signed   By: Franchot Gallo M.D.   On: 03/02/2016 11:02   Mr Jodene Nam Head/brain Wo Cm  03/02/2016  CLINICAL DATA:  Vertigo EXAM: MRI HEAD WITHOUT CONTRAST MRA HEAD WITHOUT CONTRAST TECHNIQUE: Multiplanar, multiecho pulse sequences of the brain and surrounding structures were obtained without intravenous contrast. Angiographic images of the head were obtained using MRA technique without contrast. COMPARISON:  CT head 03/01/2016 FINDINGS: MRI HEAD FINDINGS Generalized atrophy. Ventricular enlargement consistent with atrophy. Negative for acute infarct. Mild chronic white matter changes bilaterally. Mild chronic changes in the basilar. These are most consistent with chronic microvascular ischemia. Negative for intracranial hemorrhage or fluid collection Negative for mass or edema. Mild mucosal edema paranasal sinuses. Bilateral lens  replacement. No orbital mass. Image quality degraded by motion. MRA HEAD FINDINGS Image quality degraded by motion. Both vertebral arteries patent to the basilar. Fetal origin right posterior cerebral artery. Mild stenosis mid right posterior cerebral artery. Left posterior cerebral artery patent with mild stenosis distally. Superior cerebellar arteries patent bilaterally. PICA patent bilaterally. Irregularity of the cavernous carotid artery bilaterally with decreased signal bilaterally. This could be due to stenosis or artifact due to tortuosity. Anterior and middle cerebral arteries patent bilaterally without significant stenosis. Hypoplastic right A1 segment. Both anterior cerebral arteries supplied from the left. Negative for vascular malformation or aneurysm. IMPRESSION: Image quality degraded by motion Negative for acute infarct Atrophy with mild chronic microvascular ischemia Bilateral posterior cerebral artery stenoses. Probable stenosis of the cavernous carotid bilaterally, difficult to quantitate due to tortuosity and motion. Electronically Signed   By: Franchot Gallo M.D.   On: 03/02/2016 11:02    Lab Data:  CBC:  Recent Labs Lab 03/01/16 1202 03/02/16 0648  WBC 5.5 5.9  HGB 12.7 11.5*  HCT 39.3 35.4*  MCV 99.5 99.2  PLT 227 [id number]   Basic Metabolic Panel:  Recent Labs Lab 03/01/16 1202 03/02/16 0648  NA 142 141  K 3.8 3.4*  CL 109 110  CO2 24 24  GLUCOSE 108* 99  BUN 11 11  CREATININE 1.08* 0.79  CALCIUM 9.7 9.1   GFR: Estimated Creatinine Clearance: 46.3 mL/min (by C-G formula based on Cr of 0.79). Liver Function Tests: No results for input(s): AST, ALT, ALKPHOS, BILITOT, PROT, ALBUMIN in the last 168 hours. No results for input(s): LIPASE, AMYLASE in the last 168 hours. No results for input(s): AMMONIA in the last 168 hours. Coagulation Profile: No results for input(s): INR, PROTIME in the last 168 hours. Cardiac Enzymes:  Recent Labs Lab 03/01/16 1202   TROPONINI <0.03   BNP (last 3 results) No results for input(s): PROBNP in the last 8760 hours. HbA1C: No results for input(s): HGBA1C in the last 72 hours. CBG: No results for input(s): GLUCAP in the last 168 hours. Lipid Profile:  Recent Labs  03/02/16 0648  CHOL 132  HDL 69  LDLCALC 50  TRIG 63  CHOLHDL 1.9   Thyroid Function Tests: No results for input(s): TSH, T4TOTAL, FREET4, T3FREE, THYROIDAB in the last 72 hours. Anemia Panel: No results for input(s): VITAMINB12, FOLATE, FERRITIN, TIBC, IRON, RETICCTPCT in the last 72 hours. Urine analysis:    Component Value Date/Time   COLORURINE YELLOW 03/01/2016 [place] 03/01/2016 1457   LABSPEC 1.023 03/01/2016 1457   PHURINE 5.5 03/01/2016  [place] 03/01/2016 [place] 03/01/2016 [place] 03/01/2016 [place] 03/01/2016 [place] 03/01/2016 1457   NITRITE NEGATIVE 03/01/2016 1457   LEUKOCYTESUR SMALL* 03/01/2016 1457     RAI,RIPUDEEP M.D. [facility] 03/02/2016, 11:26 AM  Pager: [phone] Between 7am to 7pm - call Pager - [phone]  After 7pm go to [email or web address] - password [email or web address]  Call night coverage person covering after 7pm

## 2016-03-02 NOTE — Progress Notes (Signed)
*  PRELIMINARY RESULTS* Echocardiogram 2D Echocardiogram has been performed.  Leavy Cella 03/02/2016, 2:26 PM

## 2016-03-03 DIAGNOSIS — E785 Hyperlipidemia, unspecified: Secondary | ICD-10-CM | POA: Diagnosis not present

## 2016-03-03 DIAGNOSIS — R42 Dizziness and giddiness: Secondary | ICD-10-CM

## 2016-03-03 DIAGNOSIS — K219 Gastro-esophageal reflux disease without esophagitis: Secondary | ICD-10-CM | POA: Diagnosis not present

## 2016-03-03 DIAGNOSIS — F0390 Unspecified dementia without behavioral disturbance: Secondary | ICD-10-CM

## 2016-03-03 LAB — BASIC METABOLIC PANEL
ANION GAP: 10 (ref 5–15)
BUN: 15 mg/dL (ref 6–20)
CO2: 24 mmol/L (ref 22–32)
Calcium: 9.4 mg/dL (ref 8.9–10.3)
Chloride: 108 mmol/L (ref 101–111)
Creatinine, Ser: 1.02 mg/dL — ABNORMAL HIGH (ref 0.44–1.00)
GFR calc Af Amer: 56 mL/min — ABNORMAL LOW (ref >60)
GFR calc non Af Amer: 48 mL/min — ABNORMAL LOW (ref >60)
GLUCOSE: 110 mg/dL — AB (ref 65–99)
POTASSIUM: 3.6 mmol/L (ref 3.5–5.1)
Sodium: 142 mmol/L (ref 135–145)

## 2016-03-03 LAB — CBC
HEMATOCRIT: 36.5 % (ref 36.0–46.0)
Hemoglobin: 11.5 g/dL — ABNORMAL LOW (ref 12.0–15.0)
MCH: 31.2 pg (ref 26.0–34.0)
MCHC: 31.5 g/dL (ref 30.0–36.0)
MCV: 98.9 fL (ref 78.0–100.0)
PLATELETS: 219 10*3/uL (ref 150–400)
RBC: 3.69 MIL/uL — AB (ref 3.87–5.11)
RDW: 12.3 % (ref 11.5–15.5)
WBC: 5.7 10*3/uL (ref 4.0–10.5)

## 2016-03-03 LAB — SALICYLATE LEVEL: Salicylate Lvl: <4 mg/dL (ref 2.8–30.0)

## 2016-03-03 MED ORDER — SIMETHICONE 80 MG PO CHEW
80.0000 mg | CHEWABLE_TABLET | Freq: Once | ORAL | Status: AC
  Administered 2016-03-03: 80 mg via ORAL
  Filled 2016-03-03: qty 1

## 2016-03-03 MED ORDER — IBUPROFEN 400 MG PO TABS
400.0000 mg | ORAL_TABLET | Freq: Once | ORAL | Status: AC
  Administered 2016-03-03: 400 mg via ORAL
  Filled 2016-03-03: qty 1

## 2016-03-03 MED ORDER — OXYBUTYNIN 3.9 MG/24HR TD PTTW
1.0000 | MEDICATED_PATCH | TRANSDERMAL | Status: DC
  Administered 2016-03-04: 1 via TRANSDERMAL
  Filled 2016-03-03: qty 1

## 2016-03-03 MED ORDER — HYDRALAZINE HCL 20 MG/ML IJ SOLN
10.0000 mg | Freq: Four times a day (QID) | INTRAMUSCULAR | Status: DC | PRN
  Administered 2016-03-03 (×2): 10 mg via INTRAVENOUS
  Filled 2016-03-03 (×2): qty 1

## 2016-03-03 MED ORDER — METOCLOPRAMIDE HCL 5 MG/ML IJ SOLN
10.0000 mg | Freq: Once | INTRAMUSCULAR | Status: AC
  Administered 2016-03-03: 10 mg via INTRAVENOUS
  Filled 2016-03-03: qty 2

## 2016-03-03 NOTE — Consult Note (Signed)
Neurology Consultation Reason for Consult: Vertigo Referring Physician: Rai, R  CC: Vertigo  History is obtained from: Patient, husband  HPI: [name] is a 82 y.o. female long history of vertigo who presents with worsening vertigo of the past several days. She states that for the past 3-4 days she has felt vertiginous and had ringing in the right ear. She states that she gets intermittent ringing in the year that last for 3-4 days and has been coming intermittently for several years. She also has chronic dizziness with frequent falls per her husband.  She had an MRI which was negative for acute stroke.  She also has a history of chronic headaches and takes Tylenol or some other analgesic every day, sometimes multiple times per day.   ROS: A 14 point ROS was performed and is negative except as noted in the HPI.   Past Medical History  Diagnosis Date  . 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 She does not have any smokeless tobacco history on file. She reports that she does not drink alcohol or use illicit drugs.   Exam: Current vital signs: BP 158/71 mmHg  Pulse 86  Temp(Src) 98.4 F (36.9 C) (Oral)  Resp 18  Ht 5\' 3"  (1.6 m)  Wt 69.355 kg (152 lb 14.4 oz)  BMI 27.09 kg/m2  SpO2 97% Vital signs in last 24 hours: Temp:  [97.5 F (36.4 C)-98.4 F (36.9 C)] 98.4 F (36.9 C) (05/28 1412) Pulse Rate:  [55-86] 86 (05/28 1412) Resp:  [16-20] 18 (05/28 1412) BP: (124-186)/(63-94) 158/71 mmHg (05/28 1412) SpO2:  [92 %-97 %] 97 % (05/28 1412)   Physical Exam  Constitutional: Appears well-developed and well-nourished.  Psych: Affect appropriate to situation Eyes: No scleral injection HENT: No OP obstrucion Head: Normocephalic.  Cardiovascular: Normal rate and regular rhythm.  Respiratory: Effort normal and breath sounds normal to anterior ascultation GI: Soft.  No distension. There is no tenderness.  Skin:  WDI  Neuro: Mental Status: Patient is awake, alert, oriented to person, place, month, year, and situation. Patient is able to give a clear and coherent history. No signs of aphasia or neglect Cranial Nerves: II: Visual Fields are full. Pupils are equal, round, and reactive to light.   III,IV, VI: EOMI without ptosis or diploplia. He has no nystagmus in any direction of gaze. V: Facial sensation is symmetric to temperature VII: Facial movement is symmetric.  VIII: hearing is intact to voice X: Uvula elevates symmetrically XI: Shoulder shrug is symmetric. XII: tongue is midline without atrophy or fasciculations.  Motor: Tone is normal. Bulk is normal. 5/5 strength was present in all four extremities.  Sensory: Sensation is symmetric to light touch and temperature in the arms and legs. Cerebellar: FNF and HKS are intact bilaterally Gait: She does appear unsteady on standing.   I have reviewed labs in epic and the results pertinent to this consultation are: Borderline creatinine  I have reviewed the images obtained: My brain-negative for stroke  Impression: 82 year old female with chronic problems with dizziness for years who presents with worsening vertigo in the setting of tinnitus in the right ear. Given her history of intermittent episodes of tinnitus lasting for days at a time as well as intermittent episodes of worsening of her dizziness, I do suspect that this might represent Mnire's disease. I don't have a reason to suspect central vertigo at this time.   I suspect a large  portion of her headaches is medication overuse and have advised her to avoid all analgesics for several weeks. I think this would be my first step in her headache management and I will request a neurology appointment in about a month to evaluate her after she has given this a try.  Recommendations: 1) outpatient follow-up with ENT for evaluation for Mnire's 2) outpatient follow-up with neurology for  headache evaluation, I have requested this through epic. 3) no further inpatient recommendations other than physical therapy. Neurology will sign off at this time please call with any further questions or concerns.  Roland Rack, MD [facility] [phone]  If 7pm- 7am, please page neurology on call as listed in [place].

## 2016-03-03 NOTE — Progress Notes (Signed)
Patient BP 186/89. On-call NP Rogue Bussing notified.  Will continue to monitor and await orders.

## 2016-03-03 NOTE — Evaluation (Addendum)
Occupational Therapy Evaluation Patient Details Name: [name] MRN: [id number] DOB: 1926-09-02 Today's Date: 03/03/2016    History of Present Illness Patient is an 82 year old female with a history of hyperlipidemia, glaucoma, dementia, depression who presented with vertigo which occurred suddenly earlier on the day of admission. MRI negative for acute infarct.   Clinical Impression   Pt admitted with above. Pt getting assist with UB dressing at times, PTA. Feel pt will benefit from acute OT to increase independence prior to d/c. If pt does not qualify for CIR, recommend SNF.    Follow Up Recommendations  CIR;Supervision/Assistance - 24 hour    Equipment Recommendations  Other (comment) (defer to next venue)    Recommendations for Other Services       Precautions / Restrictions Precautions Precautions: Fall Restrictions Weight Bearing Restrictions: No      Mobility Bed Mobility               General bed mobility comments: not assessed  Transfers Overall transfer level: Needs assistance Equipment used: Rolling walker (2 wheeled) Transfers: Sit to/from [facility] Sit to Stand:  (see comments) Stand pivot transfers: Min assist       General transfer comment: +2 Mod A for sit to stand from chair without RW in front. Min guard for sit to stand with RW in front.     Balance        Min Assist for stand pivot transfer with use of RW. History of falls.                                    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  Perception     Praxis      Pertinent Vitals/Pain Pain Assessment: 0-10 Pain Score: 8  Pain Location: upper back and head Pain Descriptors / Indicators: Sore Pain Intervention(s): Monitored during session     Hand Dominance     Extremity/Trunk Assessment Upper Extremity Assessment Upper Extremity Assessment: Generalized weakness (weakness in bilateral shoulder flexors)   Lower Extremity Assessment Lower Extremity Assessment: Defer to PT evaluation       Communication Communication Communication: No difficulties   Cognition Arousal/Alertness: Awake/alert Behavior During Therapy: WFL for tasks assessed/performed Overall Cognitive Status: History of cognitive impairments - at baseline                     General Comments       Exercises       Shoulder Instructions      Home Living Family/patient expects to be discharged to:: Inpatient Rehab  Living Arrangements: Spouse/significant other Available Help at Discharge: Family (husband elderly) Type of Home: House Home Access: Stairs to enter [employer] of Steps: 2 steps and then 1 step Entrance Stairs-Rails: Right Home Layout: One level     Bathroom Shower/Tub: Retail buyer Accessibility:  Yes   Home Equipment: Kasandra Knudsen - single point;Walker - 4 wheels;Bedside commode;Tub bench;Grab bars - tub/shower;Shower seat          Prior Functioning/Environment Level of Independence: Needs assistance  Gait / Transfers Assistance Needed: used rollator   ADL's / Homemaking Assistance Needed: assist with bra at times; husband did grocery shopping, cooking, and cleaning.        OT Diagnosis: Generalized weakness   OT Problem List: Pain;Decreased cognition;Decreased knowledge of use of DME or AE;Decreased knowledge of  precautions;Impaired vision/perception;Impaired balance (sitting and/or standing);Decreased strength;Decreased activity tolerance   OT Treatment/Interventions: Self-care/ADL training;DME and/or AE instruction;Therapeutic exercise;Therapeutic activities;Patient/family education;Visual/perceptual remediation/compensation;Cognitive remediation/compensation;Balance training    OT Goals(Current goals can be found in the care plan section) Acute Rehab OT Goals Patient Stated Goal: not stated OT Goal Formulation: With patient Time For Goal Achievement: 03/10/16 Potential to Achieve Goals: Good ADL Goals Pt Will Perform Lower Body Bathing: sit to/from stand;with set-up;with supervision Pt Will Perform Lower Body Dressing: sit to/from stand;with min guard assist Pt Will Transfer to Toilet: with min guard assist;ambulating;bedside commode Pt Will Perform Toileting - Clothing Manipulation and hygiene: sit to/from stand;with min guard assist  OT Frequency: Min 2X/week   Barriers to D/C:            Co-evaluation              End of Session Equipment Utilized During Treatment: Gait belt;Rolling walker  Activity Tolerance: Patient tolerated treatment well Patient left: in chair;with call bell/phone within reach;with chair alarm set;with family/visitor present   Time: [id number] (approximately 6 minutes of MD talking with pt)  OT Time Calculation (min): 25 min Charges:  OT General Charges $OT Visit: 1 Procedure OT Evaluation $OT Eval Moderate Complexity: 1 Procedure G-Codes: OT G-codes **NOT FOR INPATIENT CLASS** Functional Assessment Tool Used: clinical judgment Functional Limitation: Self care Self Care Current Status [id number]): At least 20 percent but less than 40 percent impaired, limited or restricted Self Care Goal Status [id number]): At least 1 percent but less than 20 percent impaired, limited or restricted  Benito Mccreedy OTR/L [id number] 03/03/2016, 1:56 PM

## 2016-03-03 NOTE — Progress Notes (Signed)
Patient is wearing a dermal patch on her lower abdomen, which she states her daughter puts it on every other day. I paged MD Rogue Bussing for input. Waiting call back.

## 2016-03-03 NOTE — Progress Notes (Signed)
Inpatient Rehabilitation  OT has recommended IP Rehab.  Order for IP Rehab consult has been placed by MD, however note pt. is under observation status and MRI is negative for stroke.  Working diagnosis is Meniere's disease and headache.  Rehab team will follow up in the am, however pt's current diagnoses do not lend themselves to an IP Rehab admission.  Please call if questions.  [facility] Ad[facility] Coordinator Cell [phone] Office [phone]

## 2016-03-03 NOTE — Progress Notes (Signed)
[facility]                                                                              Patient Demographics  [name], is a 82 y.o. female, DOB - 01/10/1926, [id number]  Admit date - 03/01/2016   Admitting Physician Velvet Bathe, MD  Outpatient Primary MD for the patient is No primary care provider on file.  Outpatient specialists:   LOS -   days    Chief Complaint  Patient presents with  . Headache  . Dizziness       Brief summary   Patient is 82 year old female with a hyperlipidemia, dementia, depression presented with vertigo which occurred suddenly earlier on the day of admission. Difficult to obtain history from the patient due to dementia. Per daughter she was eating when patient slumped over and was passed out. Every time patient stood up she had vertigo. CT head in the ED was negative for stroke.   Assessment & Plan    Principal Problem:   Sudden Vertigo: Patient continues to complain of vertigo - MRI of the brain negative for acute infarct, atrophy with mild chronic microvascular ischemia - MRA with the bilateral posterior cerebral artery stenosis - Carotid Doppler showed right 1-39% ICA stenosis, left 40-59% ICA stenosis - 2-D echo showed EF of 60-65% with grade 1 diastolic dysfunction - Per patient's daughter, she has been having headache and sinus issues, placed on Ceftin - Placed on scheduled meclizine, per patient no significant improvement - PT evaluation recommending 24/7 supervision or SNF. Discussed in detail with patient's husband at the bedside who states that he cannot take care of her and however not willing to pay for skilled nursing facility due to observation status. He is requesting specifically for inpatient rehabilitation. CIR consult placed - Neurology consulted, discussed with Dr. Leonel Ramsay  Active Problems:   Hyperlipidemia - Continue statin    GERD (gastroesophageal reflux disease) - Continue PPI     Dementia - Continue Namenda  Right shoulder pain Per husband at the bedside, this is a chronic issue for years, canceled right shoulder x-ray  Code Status: Full CODE STATUS   DVT Prophylaxis:  heparin    Family Communication: Discussed in detail with the patient, all imaging results, lab results explained to the patient's husband at bed side  Disposition Plan:   Time Spent in minutes 25 minutes  Procedures:  MRI, MRA   Consultants:   None   Antimicrobials:   None    Medications  Scheduled Meds: . aspirin  325 mg Oral Daily  . cefUROXime  500 mg Oral BID WC  . heparin  5,000 Units Subcutaneous Q8H  . meclizine  25 mg Oral TID  . memantine  10 mg Oral Daily  . pantoprazole  40 mg Oral Daily  . rosuvastatin  20 mg Oral q1800  . sertraline  100 mg Oral Daily  . traZODone  150 mg Oral QHS   Continuous Infusions:  PRN Meds:.acetaminophen, hydrALAZINE, senna-docusate   Antibiotics   Anti-infectives    Start     Dose/Rate Route Frequency Ordered Stop   03/02/16 1200  cefUROXime (CEFTIN) tablet  500 mg     500 mg Oral 2 times daily with meals 03/02/16 1133          Subjective:   Jhayda Obara was seen and examined today. Complaining of right shoulder pain and dizziness. Patient denies chest pain, shortness of breath, abdominal pain, N/V/D/C, new weakness, numbess, tingling. No acute events overnight.    Objective:   Filed Vitals:   03/03/16 0223 03/03/16 0312 03/03/16 0622 03/03/16 1038  BP: 186/89 124/66 162/64 133/63  Pulse: 79 55 69 80  Temp: 98.3 F (36.8 C)  97.6 F (36.4 C) 97.5 F (36.4 C)  TempSrc: Axillary  Oral Oral  Resp: 16  16 18   Height:      Weight:      SpO2: 96%  95% 92%    Intake/Output Summary (Last 24 hours) at 03/03/16 1047 Last data filed at 03/03/16 0612  Gross per 24 hour  Intake    880 ml  Output   1000 ml  Net   -120 ml     Wt Readings from Last 3 Encounters:  03/01/16 69.355 kg (152 lb 14.4 oz)      Exam  General: Alert and oriented x 3, NAD  HEENT:    Neck:   Cardiovascular: S1 S2 auscultated, no rubs, murmurs or gallops. Regular rate and rhythm.  Respiratory: Clear to auscultation bilaterally, no wheezing, rales or rhonchi  Gastrointestinal: Soft, nontender, nondistended, + bowel sounds  Ext: no cyanosis clubbing or edema, right shoulder ROM dec  Neuro: no new deficits  Skin: No rashes  Psych: Normal affect and demeanor, alert and oriented x3    Data Reviewed:  I have personally reviewed following labs and imaging studies  Micro Results No results found for this or any previous visit (from the past 240 hour(s)).  Radiology Reports Dg Chest 2 View  03/01/2016  CLINICAL DATA:  Recent stroke-like episode EXAM: CHEST  2 VIEW COMPARISON:  None. FINDINGS: The heart size and mediastinal contours are within normal limits. Both lungs are clear. The visualized skeletal structures are unremarkable. IMPRESSION: No active cardiopulmonary disease. Electronically Signed   By: Inez Catalina M.D.   On: 03/01/2016 21:59   Ct Head Wo Contrast  03/01/2016  CLINICAL DATA:  Headache 2 weeks with dizziness today. EXAM: CT HEAD WITHOUT CONTRAST TECHNIQUE: Contiguous axial images were obtained from the base of the skull through the vertex without intravenous contrast. COMPARISON:  None. FINDINGS: Ventricles, cisterns and other CSF spaces are within normal. There is mild chronic ischemic microvascular disease. There is no mass, mass effect, shift of midline structures or acute hemorrhage. No evidence of acute infarction. Remaining bones and soft tissues are within normal. IMPRESSION: No acute intracranial findings. Mild chronic ischemic microvascular disease. Electronically Signed   By: Marin Olp M.D.   On: 03/01/2016 15:19   Mr Brain Wo Contrast  03/02/2016  CLINICAL DATA:  Vertigo EXAM: MRI HEAD WITHOUT CONTRAST MRA HEAD WITHOUT CONTRAST TECHNIQUE: Multiplanar, multiecho pulse sequences  of the brain and surrounding structures were obtained without intravenous contrast. Angiographic images of the head were obtained using MRA technique without contrast. COMPARISON:  CT head 03/01/2016 FINDINGS: MRI HEAD FINDINGS Generalized atrophy. Ventricular enlargement consistent with atrophy. Negative for acute infarct. Mild chronic white matter changes bilaterally. Mild chronic changes in the basilar. These are most consistent with chronic microvascular ischemia. Negative for intracranial hemorrhage or fluid collection Negative for mass or edema. Mild mucosal edema paranasal sinuses. Bilateral lens replacement. No orbital mass. Image  quality degraded by motion. MRA HEAD FINDINGS Image quality degraded by motion. Both vertebral arteries patent to the basilar. Fetal origin right posterior cerebral artery. Mild stenosis mid right posterior cerebral artery. Left posterior cerebral artery patent with mild stenosis distally. Superior cerebellar arteries patent bilaterally. PICA patent bilaterally. Irregularity of the cavernous carotid artery bilaterally with decreased signal bilaterally. This could be due to stenosis or artifact due to tortuosity. Anterior and middle cerebral arteries patent bilaterally without significant stenosis. Hypoplastic right A1 segment. Both anterior cerebral arteries supplied from the left. Negative for vascular malformation or aneurysm. IMPRESSION: Image quality degraded by motion Negative for acute infarct Atrophy with mild chronic microvascular ischemia Bilateral posterior cerebral artery stenoses. Probable stenosis of the cavernous carotid bilaterally, difficult to quantitate due to tortuosity and motion. Electronically Signed   By: Franchot Gallo M.D.   On: 03/02/2016 11:02   Mr Jodene Nam Head/brain Wo Cm  03/02/2016  CLINICAL DATA:  Vertigo EXAM: MRI HEAD WITHOUT CONTRAST MRA HEAD WITHOUT CONTRAST TECHNIQUE: Multiplanar, multiecho pulse sequences of the brain and surrounding structures  were obtained without intravenous contrast. Angiographic images of the head were obtained using MRA technique without contrast. COMPARISON:  CT head 03/01/2016 FINDINGS: MRI HEAD FINDINGS Generalized atrophy. Ventricular enlargement consistent with atrophy. Negative for acute infarct. Mild chronic white matter changes bilaterally. Mild chronic changes in the basilar. These are most consistent with chronic microvascular ischemia. Negative for intracranial hemorrhage or fluid collection Negative for mass or edema. Mild mucosal edema paranasal sinuses. Bilateral lens replacement. No orbital mass. Image quality degraded by motion. MRA HEAD FINDINGS Image quality degraded by motion. Both vertebral arteries patent to the basilar. Fetal origin right posterior cerebral artery. Mild stenosis mid right posterior cerebral artery. Left posterior cerebral artery patent with mild stenosis distally. Superior cerebellar arteries patent bilaterally. PICA patent bilaterally. Irregularity of the cavernous carotid artery bilaterally with decreased signal bilaterally. This could be due to stenosis or artifact due to tortuosity. Anterior and middle cerebral arteries patent bilaterally without significant stenosis. Hypoplastic right A1 segment. Both anterior cerebral arteries supplied from the left. Negative for vascular malformation or aneurysm. IMPRESSION: Image quality degraded by motion Negative for acute infarct Atrophy with mild chronic microvascular ischemia Bilateral posterior cerebral artery stenoses. Probable stenosis of the cavernous carotid bilaterally, difficult to quantitate due to tortuosity and motion. Electronically Signed   By: Franchot Gallo M.D.   On: 03/02/2016 11:02    Lab Data:  CBC:  Recent Labs Lab 03/01/16 1202 03/02/16 0648 03/03/16 0410  WBC 5.5 5.9 5.7  HGB 12.7 11.5* 11.5*  HCT 39.3 35.4* 36.5  MCV 99.5 99.2 98.9  PLT 227 200 [id number]   Basic Metabolic Panel:  Recent Labs Lab 03/01/16 1202  03/02/16 0648 03/03/16 0410  NA 142 141 142  K 3.8 3.4* 3.6  CL 109 110 108  CO2 24 24 24   GLUCOSE 108* 99 110*  BUN 11 11 15   CREATININE 1.08* 0.79 1.02*  CALCIUM 9.7 9.1 9.4   GFR: Estimated Creatinine Clearance: 36.3 mL/min (by C-G formula based on Cr of 1.02). Liver Function Tests: No results for input(s): AST, ALT, ALKPHOS, BILITOT, PROT, ALBUMIN in the last 168 hours. No results for input(s): LIPASE, AMYLASE in the last 168 hours. No results for input(s): AMMONIA in the last 168 hours. Coagulation Profile: No results for input(s): INR, PROTIME in the last 168 hours. Cardiac Enzymes:  Recent Labs Lab 03/01/16 1202  TROPONINI <0.03   BNP (last 3 results) No results for  input(s): PROBNP in the last 8760 hours. HbA1C: No results for input(s): HGBA1C in the last 72 hours. CBG: No results for input(s): GLUCAP in the last 168 hours. Lipid Profile:  Recent Labs  03/02/16 0648  CHOL 132  HDL 69  LDLCALC 50  TRIG 63  CHOLHDL 1.9   Thyroid Function Tests: No results for input(s): TSH, T4TOTAL, FREET4, T3FREE, THYROIDAB in the last 72 hours. Anemia Panel: No results for input(s): VITAMINB12, FOLATE, FERRITIN, TIBC, IRON, RETICCTPCT in the last 72 hours. Urine analysis:    Component Value Date/Time   COLORURINE YELLOW 03/01/2016 [place] 03/01/2016 1457   LABSPEC 1.023 03/01/2016 1457   PHURINE 5.5 03/01/2016 1457   GLUCOSEU NEGATIVE 03/01/2016 1457   HGBUR NEGATIVE 03/01/2016 1457   BILIRUBINUR NEGATIVE 03/01/2016 1457   KETONESUR NEGATIVE 03/01/2016 1457   PROTEINUR NEGATIVE 03/01/2016 1457   NITRITE NEGATIVE 03/01/2016 1457   LEUKOCYTESUR SMALL* 03/01/2016 1457     Iyah Laguna M.D. [facility] 03/03/2016, 10:47 AM  Pager: [phone] Between 7am to 7pm - call Pager - [phone]  After 7pm go to [email or web address] - password [email or web address]  Call night coverage person covering after 7pm

## 2016-03-04 DIAGNOSIS — E785 Hyperlipidemia, unspecified: Secondary | ICD-10-CM | POA: Diagnosis not present

## 2016-03-04 DIAGNOSIS — R42 Dizziness and giddiness: Secondary | ICD-10-CM | POA: Diagnosis not present

## 2016-03-04 DIAGNOSIS — K219 Gastro-esophageal reflux disease without esophagitis: Secondary | ICD-10-CM | POA: Diagnosis not present

## 2016-03-04 DIAGNOSIS — F0390 Unspecified dementia without behavioral disturbance: Secondary | ICD-10-CM | POA: Diagnosis not present

## 2016-03-04 LAB — BASIC METABOLIC PANEL
ANION GAP: 8 (ref 5–15)
BUN: 13 mg/dL (ref 6–20)
CHLORIDE: 109 mmol/L (ref 101–111)
CO2: 27 mmol/L (ref 22–32)
Calcium: 9.4 mg/dL (ref 8.9–10.3)
Creatinine, Ser: 0.92 mg/dL (ref 0.44–1.00)
GFR, EST NON AFRICAN AMERICAN: 54 mL/min — AB (ref >60)
Glucose, Bld: 106 mg/dL — ABNORMAL HIGH (ref 65–99)
POTASSIUM: 3.6 mmol/L (ref 3.5–5.1)
SODIUM: 144 mmol/L (ref 135–145)

## 2016-03-04 MED ORDER — MECLIZINE HCL 25 MG PO TABS: 25.0000 mg | ORAL_TABLET | Freq: Three times a day (TID) | ORAL | Status: DC | PRN

## 2016-03-04 MED ORDER — CEFUROXIME AXETIL 500 MG PO TABS: 500.0000 mg | ORAL_TABLET | Freq: Two times a day (BID) | ORAL | Status: DC

## 2016-03-04 MED ORDER — HYDROCHLOROTHIAZIDE 12.5 MG PO CAPS
12.5000 mg | ORAL_CAPSULE | Freq: Every day | ORAL | Status: DC
  Administered 2016-03-04: 12.5 mg via ORAL
  Filled 2016-03-04: qty 1

## 2016-03-04 MED ORDER — OXYBUTYNIN 3.9 MG/24HR TD PTTW: 1.0000 | MEDICATED_PATCH | TRANSDERMAL | Status: DC

## 2016-03-04 MED ORDER — HYDROCHLOROTHIAZIDE 12.5 MG PO CAPS: 12.5000 mg | ORAL_CAPSULE | Freq: Every day | ORAL | Status: DC

## 2016-03-04 MED ORDER — PROMETHAZINE HCL 12.5 MG PO TABS: 12.5000 mg | ORAL_TABLET | Freq: Four times a day (QID) | ORAL | Status: DC | PRN

## 2016-03-04 MED ORDER — ROSUVASTATIN CALCIUM 20 MG PO TABS: 20.0000 mg | ORAL_TABLET | Freq: Every day | ORAL | Status: DC

## 2016-03-04 NOTE — Evaluation (Signed)
Speech Language Pathology Evaluation Patient Details Name: [name] MRN: [id number] DOB: 1925/11/27 Today's Date: 03/04/2016 Time: [id number] SLP Time Calculation (min) (ACUTE ONLY): 13 min  Problem List:  Patient Active Problem List   Diagnosis Date Noted  . Hyperlipidemia 03/02/2016  . GERD (gastroesophageal reflux disease) 03/02/2016  . Dementia 03/02/2016  . Vertigo 03/01/2016   Past Medical History:  Past Medical History  Diagnosis Date  . Glaucoma    Past Surgical History:  Past Surgical History  Procedure Laterality Date  . Abdominal hysterectomy     HPI:  Patient is 82 year old female with a hyperlipidemia, dementia, depression presented with vertigo which occurred suddenly earlier on the day of admission. Difficult to obtain history from the patient due to dementia. Per daughter she was eating when patient slumped over and was passed out. Every time patient stood up she had vertigo. CT head in the ED was negative for stroke.  Concern for Meniere's disease, will f/u with ENT at d/c.    Assessment / Plan / Recommendation Clinical Impression  Pt demonstrates cognitive impairment at baseline, but new hearing loss associated with possible Meniere's and tinnitus. Provided basic compensatory strategies for improved auditory comprehension. Daughter present for teaching. No SLP f/u needed.     SLP Assessment  Patient does not need any further Speech Lanaguage Pathology Services    Follow Up Recommendations  24 hour supervision/assistance    Frequency and Duration           SLP Evaluation Prior Functioning  Cognitive/Linguistic Baseline: Baseline deficits Baseline deficit details: mild dementia Type of Home: House  Lives With: Spouse Available Help at Discharge: Family   Cognition  Overall Cognitive Status: History of cognitive impairments - at baseline Arousal/Alertness: Awake/alert Orientation Level: Oriented X4 Attention: Sustained Sustained Attention:  Appears intact Memory: Impaired Memory Impairment: Decreased recall of new information Awareness: Appears intact Problem Solving: Appears intact Safety/Judgment: Appears intact    Comprehension  Auditory Comprehension Overall Auditory Comprehension: Other (comment) (needs repetition) Yes/No Questions: Within Functional Limits Commands: Within Functional Limits Conversation: Complex Interfering Components: Hearing;Visual impairments EffectiveTechniques: Extra processing time;Repetition;Increased volume Reading Comprehension Reading Status: Not tested    Expression Verbal Expression Overall Verbal Expression: Appears within functional limits for tasks assessed   Oral / Motor  Oral Motor/Sensory Function Overall Oral Motor/Sensory Function: Within functional limits Motor Speech Overall Motor Speech: Appears within functional limits for tasks assessed   GO                    [name], [name] 03/04/2016, 1:35 PM

## 2016-03-04 NOTE — Progress Notes (Signed)
Physical Therapy Treatment Patient Details Name: RASHAD MEDDINGS MRN: [id number] DOB: Mar 27, 1926 Today's Date: 03/04/2016    History of Present Illness Patient is an 82 year old female with a history of hyperlipidemia, glaucoma, dementia, depression who presented with vertigo which occurred suddenly earlier on the day of admission. MRI negative for acute infarct.    PT Comments    Pt admitted with above diagnosis. Pt currently with functional limitations due to balance and endurance deficits. Pt was able to ambulate but needed to use compensatory strategies and needed min guard to min assist. Discussed safety at length with pt and daughter as husband is primary caregiver (daughter goes back to work Architectural technologist and is a Theme park manager working 12 hour shifts.  Decided safest mode of locomotion for home for pt is wheelchair and let therapists work towards ambulation with device.  HH therapies being arranged as well and daughter will get a gait belt in gift shop today.  Pt will benefit from skilled PT to increase their independence and safety with mobility to allow discharge to the venue listed below.    Follow Up Recommendations  Home health PT;Supervision/Assistance - 24 hour (Vestibular rehab, HHOT, HHAide)     Equipment Recommendations  Other (comment);Wheelchair (measurements PT);Wheelchair cushion - 16x18 pressure relieving cushion (measurements PT) (gait belt; 16x18 lightweight w/c with removable desk arms, anti tippers and foot rest.)    Recommendations for Other Services       Precautions / Restrictions Precautions Precautions: Fall Restrictions Weight Bearing Restrictions: No    Mobility  Bed Mobility Overal bed mobility: Needs Assistance Bed Mobility: Rolling;Sidelying to Sit Rolling: Min assist Sidelying to sit: Min assist          Transfers Overall transfer level: Needs assistance Equipment used: Rolling walker (2 wheeled) Transfers: Sit to/from Stand Sit to Stand: Min  assist;Min guard         General transfer comment: Pt needs steadying assist.  Discussed for daughter to pick up a gait belt.    Ambulation/Gait Ambulation/Gait assistance: Min assist Ambulation Distance (Feet): 110 Feet Assistive device: Rolling walker (2 wheeled) Gait Pattern/deviations: Step-through pattern;Decreased stride length   Gait velocity interpretation: Below normal speed for age/gender General Gait Details: Pt ambulated with RW with min assist for steadying as pt with posterior lean at times.   Needed cues for staying close to RW especially with turns. Taught pt compensatory strategies.    Stairs            Wheelchair Mobility    Modified Rankin (Stroke Patients Only)       Balance Overall balance assessment: Needs assistance;History of Falls Sitting-balance support: No upper extremity supported;Feet supported Sitting balance-Leahy Scale: Fair Sitting balance - Comments: Could sit EOB without assist.  Postural control: Posterior lean Standing balance support: Bilateral upper extremity supported;During functional activity Standing balance-Leahy Scale: Poor Standing balance comment: Pt needs steadying assist in standing with RW due to posterior lean.              High level balance activites: Direction changes;Turns;Sudden stops;Head turns High Level Balance Comments: Used compensation techniques and needed min guard assist.      Cognition Arousal/Alertness: Awake/alert Behavior During Therapy: WFL for tasks assessed/performed Overall Cognitive Status: History of cognitive impairments - at baseline Area of Impairment: Problem solving     Memory: Decreased short-term memory       Problem Solving: Slow processing;Decreased initiation;Difficulty sequencing;Requires verbal cues;Requires tactile cues      Exercises  General Comments General comments (skin integrity, edema, etc.): Did not repeat Canal[place] repositioning due to pt was feeling a  little better and she didn't want to do the treatment again.  Also attempted to educate pt regarding x1 exercises which was difficult due to pts visual issues - she only sees left upper quadrant.  Spoke with pt and daughter regarding that PT's focus at home will need to be to teach pt to use her other systems for balance. Pt may need to stay on medicine as well.        Pertinent Vitals/Pain Pain Assessment: No/denies pain  VSS    Home Living     Available Help at Discharge: Family Type of Home: House              Prior Function            PT Goals (current goals can now be found in the care plan section) Progress towards PT goals: Progressing toward goals    Frequency  Min 3X/week    PT Plan Current plan remains appropriate    Co-evaluation             End of Session Equipment Utilized During Treatment: Gait belt Activity Tolerance: Patient limited by fatigue (limited by dizziness) Patient left: in chair;with call bell/phone within reach;with chair alarm set;with family/visitor present     Time: 1010-1038 PT Time Calculation (min) (ACUTE ONLY): 28 min  Charges:  $Gait Training: 8-22 mins $Self Care/Home Management: 8-22                    G CodesIrwin Brakeman F 08-Mar-2016, 2:01 PM  [facility] [phone] (pager)

## 2016-03-04 NOTE — Discharge Summary (Signed)
Physician Discharge Summary   Patient ID: [name] MRN: [id number] DOB/AGE: [birth year] 82 y.o.  Admit date: 03/01/2016 Discharge date: 03/04/2016  Primary Care Physician:  No primary care provider on file.  Discharge Diagnoses:     Surgeon vertigo likely due to Mnire's disease  . Hyperlipidemia . GERD (gastroesophageal reflux disease) . Dementia  Consults:  Neurology, Dr. Leonel Ramsay  Recommendations for Outpatient Follow-up:  1. Home health to be arranged by case management 2. Please repeat CBC/BMET at next visit 3. Patient recommended to follow-up with outpatient ENT and neurology   DIET: Low-salt diet    Allergies:  No Known Allergies   DISCHARGE MEDICATIONS: Current Discharge Medication List    START taking these medications   Details  cefUROXime (CEFTIN) 500 MG tablet Take 1 tablet (500 mg total) by mouth 2 (two) times daily with a meal. X 10 days Qty: 20 tablet, Refills: 0    hydrochlorothiazide (MICROZIDE) 12.5 MG capsule Take 1 capsule (12.5 mg total) by mouth daily. Qty: 30 capsule, Refills: 3    meclizine (ANTIVERT) 25 MG tablet Take 1 tablet (25 mg total) by mouth 3 (three) times daily as needed for dizziness. Qty: 90 tablet, Refills: 3    oxybutynin (OXYTROL) 3.9 MG/24HR Place 1 patch onto the skin every 3 (three) days. Qty: 8 patch, Refills: 0    promethazine (PHENERGAN) 12.5 MG tablet Take 1 tablet (12.5 mg total) by mouth every 6 (six) hours as needed for nausea or vomiting. Qty: 30 tablet, Refills: 0    rosuvastatin (CRESTOR) 20 MG tablet Take 1 tablet (20 mg total) by mouth at bedtime. Qty: 30 tablet, Refills: 3      CONTINUE these medications which have NOT CHANGED   Details  ATORVASTATIN CALCIUM PO Take 40 mg by mouth daily.    memantine (NAMENDA) 5 MG tablet Take 10 mg by mouth daily.    SERTRALINE HCL PO Take 100 mg by mouth daily.    traZODone (DESYREL) 150 MG tablet Take 150 mg by mouth at bedtime.    omeprazole (PRILOSEC)  20 MG capsule Take 20 mg by mouth daily.         Brief H and P: For complete details please refer to admission H and P, but in brief Patient is 82 year old female with a hyperlipidemia, dementia, depression presented with vertigo which occurred suddenly earlier on the day of admission. Difficult to obtain history from the patient due to dementia. Per daughter she was eating when patient slumped over and was passed out. Every time patient stood up she had vertigo. CT head in the ED was negative for stroke.  Hospital Course:   Sudden Vertigo likely due to Mnire's disease - MRI of the brain negative for acute infarct, atrophy with mild chronic microvascular ischemia - MRA with the bilateral posterior cerebral artery stenosis - Carotid Doppler showed right 1-39% ICA stenosis, left 40-59% ICA stenosis - 2-D echo showed EF of 60-65% with grade 1 diastolic dysfunction - Per patient's daughter, she has been having headache and sinus issues, placed on Ceftin - Placed on meclizine, HCTZ, Phenergan for symptomatic relief, low-salt diet - PT evaluation recommending 24/7 supervision or SNF. Discussed in detail with patient's husband at the bedside who states that he cannot take care of her and however not willing to pay for skilled nursing facility. Patient was declined by inpatient rehabilitation. - Neurology was consulted, patient was benefited by Dr. Leonel Ramsay who recommended outpatient ENT follow-up for Mnire's disease and neurology for headache  evaluation. No other neurology workup. - Home health PT OT will be arranged    Hyperlipidemia - Continue statin   GERD (gastroesophageal reflux disease) - Continue PPI   Dementia - Continue Namenda  Right shoulder pain -Chronic  Day of Discharge BP 148/65 mmHg  Pulse 72  Temp(Src) 97.9 F (36.6 C) (Oral)  Resp 18  Ht 5\' 3"  (1.6 m)  Wt 69.355 kg (152 lb 14.4 oz)  BMI 27.09 kg/m2  SpO2 94%  Physical Exam: General: Alert and awake  oriented x3 not in any acute distress. HEENT: anicteric sclera, pupils reactive to light and accommodation CVS: S1-S2 clear no murmur rubs or gallops Chest: clear to auscultation bilaterally, no wheezing rales or rhonchi Abdomen: soft nontender, nondistended, normal bowel sounds Extremities: no cyanosis, clubbing or edema noted bilaterally Neuro: Cranial nerves II-XII intact, no focal neurological deficits   The results of significant diagnostics from this hospitalization (including imaging, microbiology, ancillary and laboratory) are listed below for reference.    LAB RESULTS: Basic Metabolic Panel:  Recent Labs Lab 03/03/16 0410 03/04/16 0430  NA 142 144  K 3.6 3.6  CL 108 109  CO2 24 27  GLUCOSE 110* 106*  BUN 15 13  CREATININE 1.02* 0.92  CALCIUM 9.4 9.4   Liver Function Tests: No results for input(s): AST, ALT, ALKPHOS, BILITOT, PROT, ALBUMIN in the last 168 hours. No results for input(s): LIPASE, AMYLASE in the last 168 hours. No results for input(s): AMMONIA in the last 168 hours. CBC:  Recent Labs Lab 03/02/16 0648 03/03/16 0410  WBC 5.9 5.7  HGB 11.5* 11.5*  HCT 35.4* 36.5  MCV 99.2 98.9  PLT 200 219   Cardiac Enzymes:  Recent Labs Lab 03/01/16 1202  TROPONINI <0.03   BNP: Invalid input(s): POCBNP CBG: No results for input(s): GLUCAP in the last 168 hours.  Significant Diagnostic Studies:  Dg Chest 2 View  03/01/2016  CLINICAL DATA:  Recent stroke-like episode EXAM: CHEST  2 VIEW COMPARISON:  None. FINDINGS: The heart size and mediastinal contours are within normal limits. Both lungs are clear. The visualized skeletal structures are unremarkable. IMPRESSION: No active cardiopulmonary disease. Electronically Signed   By: Inez Catalina M.D.   On: 03/01/2016 21:59   Ct Head Wo Contrast  03/01/2016  CLINICAL DATA:  Headache 2 weeks with dizziness today. EXAM: CT HEAD WITHOUT CONTRAST TECHNIQUE: Contiguous axial images were obtained from the base of the  skull through the vertex without intravenous contrast. COMPARISON:  None. FINDINGS: Ventricles, cisterns and other CSF spaces are within normal. There is mild chronic ischemic microvascular disease. There is no mass, mass effect, shift of midline structures or acute hemorrhage. No evidence of acute infarction. Remaining bones and soft tissues are within normal. IMPRESSION: No acute intracranial findings. Mild chronic ischemic microvascular disease. Electronically Signed   By: Marin Olp M.D.   On: 03/01/2016 15:19   Mr Brain Wo Contrast  03/02/2016  CLINICAL DATA:  Vertigo EXAM: MRI HEAD WITHOUT CONTRAST MRA HEAD WITHOUT CONTRAST TECHNIQUE: Multiplanar, multiecho pulse sequences of the brain and surrounding structures were obtained without intravenous contrast. Angiographic images of the head were obtained using MRA technique without contrast. COMPARISON:  CT head 03/01/2016 FINDINGS: MRI HEAD FINDINGS Generalized atrophy. Ventricular enlargement consistent with atrophy. Negative for acute infarct. Mild chronic white matter changes bilaterally. Mild chronic changes in the basilar. These are most consistent with chronic microvascular ischemia. Negative for intracranial hemorrhage or fluid collection Negative for mass or edema. Mild mucosal edema paranasal  sinuses. Bilateral lens replacement. No orbital mass. Image quality degraded by motion. MRA HEAD FINDINGS Image quality degraded by motion. Both vertebral arteries patent to the basilar. Fetal origin right posterior cerebral artery. Mild stenosis mid right posterior cerebral artery. Left posterior cerebral artery patent with mild stenosis distally. Superior cerebellar arteries patent bilaterally. PICA patent bilaterally. Irregularity of the cavernous carotid artery bilaterally with decreased signal bilaterally. This could be due to stenosis or artifact due to tortuosity. Anterior and middle cerebral arteries patent bilaterally without significant stenosis.  Hypoplastic right A1 segment. Both anterior cerebral arteries supplied from the left. Negative for vascular malformation or aneurysm. IMPRESSION: Image quality degraded by motion Negative for acute infarct Atrophy with mild chronic microvascular ischemia Bilateral posterior cerebral artery stenoses. Probable stenosis of the cavernous carotid bilaterally, difficult to quantitate due to tortuosity and motion. Electronically Signed   By: Franchot Gallo M.D.   On: 03/02/2016 11:02   Mr Jodene Nam Head/brain Wo Cm  03/02/2016  CLINICAL DATA:  Vertigo EXAM: MRI HEAD WITHOUT CONTRAST MRA HEAD WITHOUT CONTRAST TECHNIQUE: Multiplanar, multiecho pulse sequences of the brain and surrounding structures were obtained without intravenous contrast. Angiographic images of the head were obtained using MRA technique without contrast. COMPARISON:  CT head 03/01/2016 FINDINGS: MRI HEAD FINDINGS Generalized atrophy. Ventricular enlargement consistent with atrophy. Negative for acute infarct. Mild chronic white matter changes bilaterally. Mild chronic changes in the basilar. These are most consistent with chronic microvascular ischemia. Negative for intracranial hemorrhage or fluid collection Negative for mass or edema. Mild mucosal edema paranasal sinuses. Bilateral lens replacement. No orbital mass. Image quality degraded by motion. MRA HEAD FINDINGS Image quality degraded by motion. Both vertebral arteries patent to the basilar. Fetal origin right posterior cerebral artery. Mild stenosis mid right posterior cerebral artery. Left posterior cerebral artery patent with mild stenosis distally. Superior cerebellar arteries patent bilaterally. PICA patent bilaterally. Irregularity of the cavernous carotid artery bilaterally with decreased signal bilaterally. This could be due to stenosis or artifact due to tortuosity. Anterior and middle cerebral arteries patent bilaterally without significant stenosis. Hypoplastic right A1 segment. Both  anterior cerebral arteries supplied from the left. Negative for vascular malformation or aneurysm. IMPRESSION: Image quality degraded by motion Negative for acute infarct Atrophy with mild chronic microvascular ischemia Bilateral posterior cerebral artery stenoses. Probable stenosis of the cavernous carotid bilaterally, difficult to quantitate due to tortuosity and motion. Electronically Signed   By: Franchot Gallo M.D.   On: 03/02/2016 11:02    2D ECHO: Study Conclusions  - Left ventricle: The cavity size was normal. Wall thickness was  increased in a pattern of mild LVH. Systolic function was normal.  The estimated ejection fraction was in the range of 60% to 65%.  Wall motion was normal; there were no regional wall motion  abnormalities. Doppler parameters are consistent with abnormal  left ventricular relaxation (grade 1 diastolic dysfunction).  Doppler parameters are consistent with both elevated ventricular  end-diastolic filling pressure and elevated left atrial filling  pressure. - Mitral valve: Calcified annulus. - Atrial septum: No defect or patent foramen ovale was identified.  Disposition and Follow-up: Discharge Instructions    Ambulatory referral to Neurology    Complete by:  As directed   An appointment is requested in approximately: 4 weeks     Diet - low sodium heart healthy    Complete by:  As directed      Increase activity slowly    Complete by:  As directed  DISPOSITION: home    DISCHARGE FOLLOW-UP Follow-up Information    Follow up with Ascencion Dike, MD. Schedule an appointment as soon as possible for a visit in 10 days.   Specialty:  Otolaryngology   Why:  for hospital follow-up for Meniere's disease (ENT)    Contact information:   [address] [place] [address] [phone]       Follow up with Dudley Major, DO. Schedule an appointment as soon as possible for a visit in 2 weeks.   Specialty:  Neurology    Why:  for hospital follow-up (Neurology)   Contact information:   [address] [place] [address] [phone]        Time spent on Discharge: 46mins   Signed:   Dillon Mcreynolds M.D. [facility] 03/04/2016, 10:52 AM Pager: [phone]

## 2016-03-04 NOTE — Progress Notes (Signed)
[name] to be D/C'd Home per MD order. Discussed with the patient and all questions fully answered.    Medication List    TAKE these medications        ATORVASTATIN CALCIUM PO  Take 40 mg by mouth daily.     cefUROXime 500 MG tablet  Commonly known as:  CEFTIN  Take 1 tablet (500 mg total) by mouth 2 (two) times daily with a meal. X 10 days     hydrochlorothiazide 12.5 MG capsule  Commonly known as:  MICROZIDE  Take 1 capsule (12.5 mg total) by mouth daily.     meclizine 25 MG tablet  Commonly known as:  ANTIVERT  Take 1 tablet (25 mg total) by mouth 3 (three) times daily as needed for dizziness.     memantine 5 MG tablet  Commonly known as:  NAMENDA  Take 10 mg by mouth daily.     omeprazole 20 MG capsule  Commonly known as:  PRILOSEC  Take 20 mg by mouth daily.     oxybutynin 3.9 MG/24HR  Commonly known as:  OXYTROL  Place 1 patch onto the skin every 3 (three) days.     promethazine 12.5 MG tablet  Commonly known as:  PHENERGAN  Take 1 tablet (12.5 mg total) by mouth every 6 (six) hours as needed for nausea or vomiting.     rosuvastatin 20 MG tablet  Commonly known as:  CRESTOR  Take 1 tablet (20 mg total) by mouth at bedtime.     SERTRALINE HCL PO  Take 100 mg by mouth daily.     traZODone 150 MG tablet  Commonly known as:  DESYREL  Take 150 mg by mouth at bedtime.        VVS, Skin clean, dry and intact without evidence of skin break down, no evidence of skin tears noted.  IV catheter discontinued intact. Site without signs and symptoms of complications. Dressing and pressure applied.  An After Visit Summary was printed and given to the patient.  Patient escorted via [place], and D/C home via private auto.  Audria Nine F  03/04/2016 1:55 PM

## 2016-03-04 NOTE — Care Management Note (Signed)
Case Management Note  Patient Details  Name: [name] MRN: [id number] Date of Birth: 1925/12/26  Subjective/Objective:                 Admitted with vertigo   Action/Plan: Spoke with patient and her daughter about discharge plan. Patient chose [facility] which she worked with in the past. Unknown [facility] with [facility] and set up vestibular HHPT, [place], and aide visits. Patient lives with her husband and daughter is there prn. Contacted Jermaine at [facility], wheelchair delivered to patient's room. Daughter stated that patient has a rolling walker, cane, bsc, and tub bench.  Expected Discharge Date:                  Expected Discharge Plan:  [place]  In-House Referral:  NA  Discharge planning Services  CM Consult  Post Acute Care Choice:  Durable Medical Equipment, Home Health Choice offered to:  Patient  DME Arranged:  Wheelchair manual DME Agency:  [place]:  PT, OT, Nurse's Aide [place] Agency:  [place]  Status of Service:  Completed, signed off  Medicare Important Message Given:    Date Medicare IM Given:    Medicare IM give by:    Date Additional Medicare IM Given:    Additional Medicare Important Message give by:     If discussed at [place] of Stay Meetings, dates discussed:    Additional Comments:  Nila Nephew, RN 03/04/2016, 11:44 AM

## 2016-03-04 NOTE — Discharge Instructions (Signed)
Meniere Disease Meniere disease is an inner ear disorder. It causes attacks of a spinning sensation (vertigo) and ringing in the ear (tinnitus). It also causes hearing loss and a sensation of fullness or pressure in your ear.  Meniere disease is lifelong. It may get worse over time. Symptoms usually begin in one ear but may eventually affect both ears.  CAUSES Meniere disease is caused by having too much of the fluid that is in your inner ear (endolymph). When endolymph builds up in your inner ear, it affects the nerves that control balance and hearing. The reason for the endolymph buildup is not known. Possible causes include:  Allergy.  An abnormal reaction of the body's defense system (autoimmune disease).  Viral infection of the inner ear.  Head injury. RISK FACTORS  Age older than 40 years.  Family history of Meniere disease.  History of autoimmune disease.  History of migraine headaches. SIGNS AND SYMPTOMS Symptoms of Meniere disease can come and go and may last for up to 4 hours at a time. Symptoms usually start in one ear and may become more frequent and eventually involve both ears. Symptoms can include:  Fullness and pressure in your ear.  Roaring or ringing in your ear.  Vertigo and loss of balance.  Decreased hearing.  Nausea and vomiting. DIAGNOSIS Your health care provider will perform a physical exam. Tests may be done to confirm a diagnosis of Meniere disease. These tests may include:  A hearing test (audiogram).  An electronystagmogram. This tests your balance nerve (vestibular nerve).  Imaging studies, such as CT or MRI, of your inner ear. TREATMENT There is no cure for Meniere disease, but it can be managed. Management may include:  A diet that may help relieve symptoms of Meniere disease.  Use of medicines to reduce:  Vertigo.  Nausea.  Fluid retention.  Use of an air pressure pulse generator. This is a machine that sends small pressure  pulses into your ear canal.  Inner ear surgery (rare). When you experience symptoms, it can be helpful to lie down on a flat surface and focus your eyes on one object that does not move. Try to stay in that position until your symptoms go away.  HOME CARE INSTRUCTIONS   Take medicines only as directed by your health care provider.  Eat the same amount of food at the same time every day, including snacks.  Do not skip meals.  Limit the salt in your diet to 1,000 mg a day.  Avoid caffeine.  Limit alcoholic drinks to one drink a day.  Do not eat foods containing monosodium glutamate (MSG).  Drink enough fluids to keep your urine clear or pale yellow.  Do not use any tobacco products including cigarettes, chewing tobacco, or electronic cigarettes. If you need help quitting, ask your health care provider.  Find ways to reduce or avoid stress. SEEK MEDICAL CARE IF:   You have symptoms that last longer than 4 hours.  You have new or more severe symptoms. SEEK IMMEDIATE MEDICAL CARE IF:   You have been vomiting for 24 hours.  You are not able to keep fluids down.  You have chest pain or trouble breathing.   This information is not intended to replace advice given to you by your health care provider. Make sure you discuss any questions you have with your health care provider.   Document Released: 09/20/2000 Document Revised: 10/14/2014 Document Reviewed: 09/06/2013 Elsevier Interactive Patient Education 2016 [employer] Inc.  

## 2016-03-04 NOTE — Care Management Obs Status (Signed)
[place] NOTIFICATION   Patient Details  Name: [name] MRN: [id number] Date of Birth: 09-Jan-1926   Medicare Observation Status Notification Given:  Yes    Nila Nephew, RN 03/04/2016, 10:26 AM

## 2016-03-04 NOTE — Progress Notes (Signed)
Thank you for consult on [name]. Chart reviewed and note that patient was admitted with dizziness due to Menier's disease. She does not have diagnosis or complexity to justify CIR stay and would recommend SNF if family unable to provide care needed after discharge.

## 2016-03-05 ENCOUNTER — Encounter (HOSPITAL_COMMUNITY): Payer: Self-pay | Admitting: Emergency Medicine

## 2016-03-05 DIAGNOSIS — F329 Major depressive disorder, single episode, unspecified: Secondary | ICD-10-CM | POA: Diagnosis not present

## 2016-03-05 DIAGNOSIS — R42 Dizziness and giddiness: Secondary | ICD-10-CM | POA: Diagnosis not present

## 2016-03-05 DIAGNOSIS — M25511 Pain in right shoulder: Secondary | ICD-10-CM | POA: Diagnosis not present

## 2016-03-05 DIAGNOSIS — H409 Unspecified glaucoma: Secondary | ICD-10-CM | POA: Diagnosis not present

## 2016-03-05 DIAGNOSIS — G8929 Other chronic pain: Secondary | ICD-10-CM | POA: Diagnosis not present

## 2016-03-05 DIAGNOSIS — F0390 Unspecified dementia without behavioral disturbance: Secondary | ICD-10-CM | POA: Diagnosis not present

## 2016-03-05 DIAGNOSIS — R2689 Other abnormalities of gait and mobility: Secondary | ICD-10-CM | POA: Diagnosis not present

## 2016-03-05 DIAGNOSIS — I6623 Occlusion and stenosis of bilateral posterior cerebral arteries: Secondary | ICD-10-CM | POA: Diagnosis not present

## 2016-03-05 LAB — HEMOGLOBIN A1C
Hgb A1c MFr Bld: 6.2 % — ABNORMAL HIGH (ref 4.8–5.6)
MEAN PLASMA GLUCOSE: 131 mg/dL

## 2016-03-06 ENCOUNTER — Telehealth: Payer: Self-pay

## 2016-03-06 DIAGNOSIS — F0390 Unspecified dementia without behavioral disturbance: Secondary | ICD-10-CM | POA: Diagnosis not present

## 2016-03-06 DIAGNOSIS — R42 Dizziness and giddiness: Secondary | ICD-10-CM | POA: Diagnosis not present

## 2016-03-06 DIAGNOSIS — R2689 Other abnormalities of gait and mobility: Secondary | ICD-10-CM | POA: Diagnosis not present

## 2016-03-06 DIAGNOSIS — G8929 Other chronic pain: Secondary | ICD-10-CM | POA: Diagnosis not present

## 2016-03-06 DIAGNOSIS — M25511 Pain in right shoulder: Secondary | ICD-10-CM | POA: Diagnosis not present

## 2016-03-06 DIAGNOSIS — F329 Major depressive disorder, single episode, unspecified: Secondary | ICD-10-CM | POA: Diagnosis not present

## 2016-03-06 NOTE — Telephone Encounter (Signed)
Noted.  She is not scheduled for an appt 03/21/16 - she is not on the schedule.  Please place on the schedule.  Thanks.

## 2016-03-06 NOTE — Telephone Encounter (Signed)
Transition Care Management Follow-up Telephone Call   Date discharged? 03/04/16  Information received from daughter DPR Elyse Jarvis), HIPPA compliant.   How have you been since you were released from the hospital? Weak. Unsteady gait.  Bilateral shoulder pain from being repeatedly lifted by family.  Appetite ok.  Diarrhea, wears brief.  1x fall and EMS was called to help her get up but did not return to hospital because she did not hurt herself.  HH came out to assess today.       Do you understand why you were in the hospital? YES, dizziness.   Do you understand the discharge instructions? YES, increase activity as tolerated.  Low salt diet.  HH PT assessment.     Where were you discharged to? HOME.   Items Reviewed:  Medications reviewed:  YES, husband is helping manage medications. Started taking CEFTIN 500mg , MICROZIDE 12.5mg , ANTIVERT 25mg , OXYTROL 3.9mg , PHENERGAN 12.5mg , CRESTOR 20mg  without issues. Continuing all other medications which have not changed.   Allergies reviewed: YES, no known allergies.  Dietary changes reviewed:  YES, low-salt diet.  No issues.  Referrals reviewed: YES, appointments scheduled with Otolaryngology, Neurology and PCP.   Functional Questionnaire:   Activities of Daily Living (ADLs):   She states they are independent in the following: Requires assistance with ADLs.  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 Unsteady gait.  States they require assistance with the following: Ambulating (use of wheelchair only), bathing, dressing, meal prep.  Daughter, husband to assist.   Any transportation issues/concerns?: NO, family to bring.   Any patient concerns? YES, family would like for her to be admitted to a skilled nursing facility.  Husband originally declined, but now realizes it is too hard to care for her at home.      Confirmed importance and date/time of follow-up visits scheduled YES, appointment scheduled 03/21/16 at 1:30.  Provider  Appointment booked with Dr. Nicki Reaper (PCP).  Confirmed with patient if condition begins to worsen call PCP or go to the ER.  Patient was given the office number and encouraged to call back with question or concerns.  : YES, patient's daughter verbalized understanding.

## 2016-03-07 DIAGNOSIS — M25511 Pain in right shoulder: Secondary | ICD-10-CM | POA: Diagnosis not present

## 2016-03-07 DIAGNOSIS — R2689 Other abnormalities of gait and mobility: Secondary | ICD-10-CM | POA: Diagnosis not present

## 2016-03-07 DIAGNOSIS — F0390 Unspecified dementia without behavioral disturbance: Secondary | ICD-10-CM | POA: Diagnosis not present

## 2016-03-07 DIAGNOSIS — R42 Dizziness and giddiness: Secondary | ICD-10-CM | POA: Diagnosis not present

## 2016-03-07 DIAGNOSIS — F329 Major depressive disorder, single episode, unspecified: Secondary | ICD-10-CM | POA: Diagnosis not present

## 2016-03-07 DIAGNOSIS — G8929 Other chronic pain: Secondary | ICD-10-CM | POA: Diagnosis not present

## 2016-03-07 NOTE — Telephone Encounter (Signed)
Patient wants the June 8 appointment and has been scheduled.

## 2016-03-07 NOTE — Telephone Encounter (Signed)
Does she want/need an earlier appt.  If so, per message there is an opening on 03/14/16.  Let me know either way, because I have someone else I need to work in.  Thanks

## 2016-03-07 NOTE — Telephone Encounter (Signed)
Request was sent to Clinical Team Lead yesterday to place on schedule.  Will follow up with her to ensure placement.

## 2016-03-11 DIAGNOSIS — G8929 Other chronic pain: Secondary | ICD-10-CM | POA: Diagnosis not present

## 2016-03-11 DIAGNOSIS — F0390 Unspecified dementia without behavioral disturbance: Secondary | ICD-10-CM | POA: Diagnosis not present

## 2016-03-11 DIAGNOSIS — R2689 Other abnormalities of gait and mobility: Secondary | ICD-10-CM | POA: Diagnosis not present

## 2016-03-11 DIAGNOSIS — M25511 Pain in right shoulder: Secondary | ICD-10-CM | POA: Diagnosis not present

## 2016-03-11 DIAGNOSIS — R42 Dizziness and giddiness: Secondary | ICD-10-CM | POA: Diagnosis not present

## 2016-03-11 DIAGNOSIS — F329 Major depressive disorder, single episode, unspecified: Secondary | ICD-10-CM | POA: Diagnosis not present

## 2016-03-12 DIAGNOSIS — R42 Dizziness and giddiness: Secondary | ICD-10-CM | POA: Diagnosis not present

## 2016-03-12 DIAGNOSIS — M25511 Pain in right shoulder: Secondary | ICD-10-CM | POA: Diagnosis not present

## 2016-03-12 DIAGNOSIS — F329 Major depressive disorder, single episode, unspecified: Secondary | ICD-10-CM | POA: Diagnosis not present

## 2016-03-12 DIAGNOSIS — R2689 Other abnormalities of gait and mobility: Secondary | ICD-10-CM | POA: Diagnosis not present

## 2016-03-12 DIAGNOSIS — F0390 Unspecified dementia without behavioral disturbance: Secondary | ICD-10-CM | POA: Diagnosis not present

## 2016-03-12 DIAGNOSIS — G8929 Other chronic pain: Secondary | ICD-10-CM | POA: Diagnosis not present

## 2016-03-13 DIAGNOSIS — F0390 Unspecified dementia without behavioral disturbance: Secondary | ICD-10-CM | POA: Diagnosis not present

## 2016-03-13 DIAGNOSIS — M25511 Pain in right shoulder: Secondary | ICD-10-CM | POA: Diagnosis not present

## 2016-03-13 DIAGNOSIS — F329 Major depressive disorder, single episode, unspecified: Secondary | ICD-10-CM | POA: Diagnosis not present

## 2016-03-13 DIAGNOSIS — R2689 Other abnormalities of gait and mobility: Secondary | ICD-10-CM | POA: Diagnosis not present

## 2016-03-13 DIAGNOSIS — R42 Dizziness and giddiness: Secondary | ICD-10-CM | POA: Diagnosis not present

## 2016-03-13 DIAGNOSIS — G8929 Other chronic pain: Secondary | ICD-10-CM | POA: Diagnosis not present

## 2016-03-14 ENCOUNTER — Ambulatory Visit (INDEPENDENT_AMBULATORY_CARE_PROVIDER_SITE_OTHER): Payer: Medicare Other | Admitting: Internal Medicine

## 2016-03-14 ENCOUNTER — Encounter: Payer: Self-pay | Admitting: Internal Medicine

## 2016-03-14 VITALS — BP 120/84 | HR 91 | Temp 98.5°F | Wt 149.6 lb

## 2016-03-14 DIAGNOSIS — R2689 Other abnormalities of gait and mobility: Secondary | ICD-10-CM | POA: Diagnosis not present

## 2016-03-14 DIAGNOSIS — Z5189 Encounter for other specified aftercare: Secondary | ICD-10-CM

## 2016-03-14 DIAGNOSIS — R42 Dizziness and giddiness: Secondary | ICD-10-CM

## 2016-03-14 DIAGNOSIS — M25511 Pain in right shoulder: Secondary | ICD-10-CM | POA: Diagnosis not present

## 2016-03-14 DIAGNOSIS — I10 Essential (primary) hypertension: Secondary | ICD-10-CM

## 2016-03-14 DIAGNOSIS — F329 Major depressive disorder, single episode, unspecified: Secondary | ICD-10-CM | POA: Diagnosis not present

## 2016-03-14 DIAGNOSIS — R51 Headache: Secondary | ICD-10-CM

## 2016-03-14 DIAGNOSIS — F0390 Unspecified dementia without behavioral disturbance: Secondary | ICD-10-CM | POA: Diagnosis not present

## 2016-03-14 DIAGNOSIS — E7800 Pure hypercholesterolemia, unspecified: Secondary | ICD-10-CM | POA: Diagnosis not present

## 2016-03-14 DIAGNOSIS — G8929 Other chronic pain: Secondary | ICD-10-CM | POA: Diagnosis not present

## 2016-03-14 DIAGNOSIS — R519 Headache, unspecified: Secondary | ICD-10-CM

## 2016-03-14 DIAGNOSIS — F32A Depression, unspecified: Secondary | ICD-10-CM

## 2016-03-14 NOTE — Progress Notes (Signed)
Patient ID: [name], female   DOB: [birth year], 82 y.o.   MRN: [id number]   Subjective:    Patient ID: [name], female    DOB: December 06, 1925, 82 y.o.   MRN: [id number]  HPI  Patient here for a hospital follow up.  She was admitted 03/01/16 with vertigo.  Discharged summary reviewed.  She had MRI, MRA, carotid ultrasound and ECHO.  Results reviewed.  Neurology consulted.  Recommended ENT w/up for Meniere's disease and recommended neurology evaluation for headache.  She is scheduled to see ENT next week for further w/up.  She reports she feels better since discharge.  Still dizzy.  Walks with a walker.  Describes a burning sensations in her nose.  No chest pain.  No sob.  No acid reflux.  She is eating.  No nausea or vomiting.  No abdominal pain or cramping.  Bowels stable.    Past Medical History  Diagnosis Date  . Cancer [facility])     uterine and questionable vulvar, s/p hysterectomy  . Hypercholesterolemia   . Chronic headaches   . GERD (gastroesophageal reflux disease)   . Arthritis   . Right bundle branch block   . Reactive airway disease   . Cystocele   . Rectocele   . Hypertension   . Macular degeneration   . Dementia     able to sign own papers  . Vertigo     none - several yrs  . Wears dentures     full upper and lower  . Glaucoma    Past Surgical History  Procedure Laterality Date  . Cholecystectomy    . Appendectomy    . Abdominal hysterectomy    . Vulvectomy    . Replacement total knee Left   . Image guided sinus surgery N/A 06/20/2015    Procedure: IMAGE GUIDED SINUS SURGERY;  Surgeon: Clyde Canterbury, MD;  Location: [facility];  Service: ENT;  Laterality: N/A;  GAVE DISK TO CE CE  . Sphenoidectomy Bilateral 06/20/2015    Procedure: SPHENOIDECTOMY;  Surgeon: Clyde Canterbury, MD;  Location: [facility];  Service: ENT;  Laterality: Bilateral;  . Abdominal hysterectomy     Family History  Problem Relation Age of Onset  . Heart disease Mother   .  Cancer Father     leukemia  . Heart disease Brother     x2  . Cancer Daughter     breast   Social History   Social History  . Marital Status: Married    Spouse Name: N/A  . Number of Children: N/A  . Years of Education: N/A   Social History Main Topics  . Smoking status: Never Smoker   . Smokeless tobacco: None  . Alcohol Use: No  . Drug Use: No  . Sexual Activity: No   Other Topics Concern  . None   Social History Narrative   ** Merged History Encounter **        Outpatient Encounter Prescriptions as of 03/14/2016  Medication Sig  . Aspirin-Salicylamide-Caffeine (BC HEADACHE POWDER PO) Take 1 packet by mouth daily as needed (pain/headache).  [name] atorvastatin (LIPITOR) 40 MG tablet TAKE ONE TABLET AT 6 P.M. (Patient taking differently: TAKE ONE TABLET BY MOUTH DAILY AT BEDTIME)  . brimonidine (ALPHAGAN P) 0.1 % SOLN Place 1 drop into both eyes 2 (two) times daily.  . brinzolamide (AZOPT) 1 % ophthalmic suspension Place 1 drop into the left eye 3 (three) times daily.  . fluticasone (FLONASE) 50 MCG/ACT  nasal spray Place 2 sprays into both nostrils daily. (Patient taking differently: Place 2 sprays into both nostrils daily as needed (seasonal allergies). )  . hydrochlorothiazide (MICROZIDE) 12.5 MG capsule Take 1 capsule (12.5 mg total) by mouth daily.  [name] latanoprost (XALATAN) 0.005 % ophthalmic solution Place 1 drop into both eyes at bedtime.  . meclizine (ANTIVERT) 25 MG tablet Take 1 tablet (25 mg total) by mouth 3 (three) times daily as needed for dizziness.  . memantine (NAMENDA) 10 MG tablet Take 10 mg by mouth 2 (two) times daily.   . Multiple Vitamin (MULTIVITAMIN WITH MINERALS) TABS tablet Take 1 tablet by mouth daily.  . Multiple Vitamins-Minerals (PRESERVISION AREDS) TABS Take 1 tablet by mouth daily.   . Omega-3 Fatty Acids (FISH OIL CONCENTRATE PO) Take 1 capsule by mouth daily.   [name] omeprazole (PRILOSEC) 20 MG capsule TAKE ONE CAPSULE EVERY MORNING  . oxybutynin  (OXYTROL) 3.9 MG/24HR Place 1 patch onto the skin every 3 (three) days.  . promethazine (PHENERGAN) 12.5 MG tablet Take 1 tablet (12.5 mg total) by mouth every 6 (six) hours as needed for nausea or vomiting.  . rosuvastatin (CRESTOR) 20 MG tablet Take 1 tablet (20 mg total) by mouth at bedtime.  . sertraline (ZOLOFT) 100 MG tablet Take 150 mg by mouth daily.   . SERTRALINE HCL PO Take 100 mg by mouth daily.  . traMADol (ULTRAM) 50 MG tablet Take 25 mg by mouth 2 (two) times daily.  . travoprost, benzalkonium, (TRAVATAN) 0.004 % ophthalmic solution Place 1 drop into both eyes at bedtime.  . traZODone (DESYREL) 150 MG tablet Take 150 mg by mouth at bedtime.  . vitamin D, CHOLECALCIFEROL, 400 UNITS tablet Take 400 Units by mouth daily.  . [DISCONTINUED] acetaminophen (TYLENOL) 500 MG tablet Take 1,000 mg by mouth 2 (two) times daily. Reported on 03/14/2016  . [DISCONTINUED] ATORVASTATIN CALCIUM PO Take 40 mg by mouth daily.  . [DISCONTINUED] cefUROXime (CEFTIN) 500 MG tablet Take 1 tablet (500 mg total) by mouth 2 (two) times daily with a meal. X 10 days (Patient not taking: Reported on 03/14/2016)  . [DISCONTINUED] HYDROcodone-acetaminophen (NORCO/VICODIN) 5-325 MG per tablet Take 1 tablet by mouth every 4 (four) hours as needed for moderate pain. (Patient not taking: Reported on 01/15/2016)  . [DISCONTINUED] Melatonin 5 MG TABS Take 10 mg by mouth at bedtime. Reported on 03/14/2016  . [DISCONTINUED] memantine (NAMENDA) 5 MG tablet Take 10 mg by mouth daily. Reported on 03/14/2016  . [DISCONTINUED] omeprazole (PRILOSEC) 20 MG capsule Take 20 mg by mouth daily. Reported on 03/14/2016  . [DISCONTINUED] oxybutynin (OXYTROL) 3.9 MG/24HR Place 1 patch onto the skin every 3 (three) days. Reported on 03/14/2016  . [DISCONTINUED] traZODone (DESYREL) 50 MG tablet Take 150 mg by mouth at bedtime. Reported on 03/14/2016   No facility-administered encounter medications on file as of 03/14/2016.    Review of Systems    Constitutional: Negative for appetite change and unexpected weight change.  HENT: Negative for congestion and sinus pressure.   Eyes:       Unable to see.   Respiratory: Negative for cough, chest tightness and shortness of breath.   Cardiovascular: Negative for chest pain, palpitations and leg swelling.  Gastrointestinal: Negative for nausea, vomiting, abdominal pain and diarrhea.  Genitourinary: Negative for dysuria and difficulty urinating.  Musculoskeletal: Negative for myalgias and joint swelling.  Skin: Negative for color change and rash.  Neurological: Positive for dizziness and light-headedness.  Psychiatric/Behavioral: Negative for dysphoric mood and  agitation.       Objective:    Physical Exam  Constitutional: She appears well-developed and well-nourished. No distress.  HENT:  Nose: Nose normal.  Mouth/Throat: Oropharynx is clear and moist.  Neck: Neck supple. No thyromegaly present.  Cardiovascular: Normal rate and regular rhythm.   Pulmonary/Chest: Breath sounds normal. No respiratory distress. She has no wheezes.  Abdominal: Soft. Bowel sounds are normal. There is no tenderness.  Musculoskeletal: She exhibits no edema or tenderness.  Lymphadenopathy:    She has no cervical adenopathy.  Skin: No rash noted. No erythema.  Psychiatric: She has a normal mood and affect. Her behavior is normal.    BP 120/84 mmHg  Pulse 91  Temp(Src) 98.5 F (36.9 C)  Wt 149 lb 9.6 oz (67.858 kg)  SpO2 92% Wt Readings from Last 3 Encounters:  03/14/16 149 lb 9.6 oz (67.858 kg)  03/01/16 152 lb 14.4 oz (69.355 kg)  01/15/16 151 lb 8 oz (68.72 kg)     Lab Results  Component Value Date   WBC 7.3 03/14/2016   HGB 13.9 03/14/2016   HCT 41.6 03/14/2016   PLT 289.0 03/14/2016   GLUCOSE 103* 03/14/2016   CHOL 132 03/02/2016   TRIG 63 03/02/2016   HDL 69 03/02/2016   LDLCALC 50 03/02/2016   ALT 16 01/15/2016   AST 21 01/15/2016   NA 139 03/14/2016   K 3.8 03/14/2016   CL  100 03/14/2016   CREATININE 0.94 03/14/2016   BUN 23 03/14/2016   CO2 29 03/14/2016   TSH 2.81 07/03/2015   INR 1.07 01/15/2016   HGBA1C 6.2* 03/02/2016   MICROALBUR 1.0 03/01/2015    Dg Chest 2 View  03/01/2016  CLINICAL DATA:  Recent stroke-like episode EXAM: CHEST  2 VIEW COMPARISON:  None. FINDINGS: The heart size and mediastinal contours are within normal limits. Both lungs are clear. The visualized skeletal structures are unremarkable. IMPRESSION: No active cardiopulmonary disease. Electronically Signed   By: Inez Catalina M.D.   On: 03/01/2016 21:59   Ct Head Wo Contrast  03/01/2016  CLINICAL DATA:  Headache 2 weeks with dizziness today. EXAM: CT HEAD WITHOUT CONTRAST TECHNIQUE: Contiguous axial images were obtained from the base of the skull through the vertex without intravenous contrast. COMPARISON:  None. FINDINGS: Ventricles, cisterns and other CSF spaces are within normal. There is mild chronic ischemic microvascular disease. There is no mass, mass effect, shift of midline structures or acute hemorrhage. No evidence of acute infarction. Remaining bones and soft tissues are within normal. IMPRESSION: No acute intracranial findings. Mild chronic ischemic microvascular disease. Electronically Signed   By: Marin Olp M.D.   On: 03/01/2016 15:19   Mr Brain Wo Contrast  03/02/2016  CLINICAL DATA:  Vertigo EXAM: MRI HEAD WITHOUT CONTRAST MRA HEAD WITHOUT CONTRAST TECHNIQUE: Multiplanar, multiecho pulse sequences of the brain and surrounding structures were obtained without intravenous contrast. Angiographic images of the head were obtained using MRA technique without contrast. COMPARISON:  CT head 03/01/2016 FINDINGS: MRI HEAD FINDINGS Generalized atrophy. Ventricular enlargement consistent with atrophy. Negative for acute infarct. Mild chronic white matter changes bilaterally. Mild chronic changes in the basilar. These are most consistent with chronic microvascular ischemia. Negative for  intracranial hemorrhage or fluid collection Negative for mass or edema. Mild mucosal edema paranasal sinuses. Bilateral lens replacement. No orbital mass. Image quality degraded by motion. MRA HEAD FINDINGS Image quality degraded by motion. Both vertebral arteries patent to the basilar. Fetal origin right posterior cerebral artery. Mild stenosis  mid right posterior cerebral artery. Left posterior cerebral artery patent with mild stenosis distally. Superior cerebellar arteries patent bilaterally. PICA patent bilaterally. Irregularity of the cavernous carotid artery bilaterally with decreased signal bilaterally. This could be due to stenosis or artifact due to tortuosity. Anterior and middle cerebral arteries patent bilaterally without significant stenosis. Hypoplastic right A1 segment. Both anterior cerebral arteries supplied from the left. Negative for vascular malformation or aneurysm. IMPRESSION: Image quality degraded by motion Negative for acute infarct Atrophy with mild chronic microvascular ischemia Bilateral posterior cerebral artery stenoses. Probable stenosis of the cavernous carotid bilaterally, difficult to quantitate due to tortuosity and motion. Electronically Signed   By: Franchot Gallo M.D.   On: 03/02/2016 11:02   Mr Jodene Nam Head/brain Wo Cm  03/02/2016  CLINICAL DATA:  Vertigo EXAM: MRI HEAD WITHOUT CONTRAST MRA HEAD WITHOUT CONTRAST TECHNIQUE: Multiplanar, multiecho pulse sequences of the brain and surrounding structures were obtained without intravenous contrast. Angiographic images of the head were obtained using MRA technique without contrast. COMPARISON:  CT head 03/01/2016 FINDINGS: MRI HEAD FINDINGS Generalized atrophy. Ventricular enlargement consistent with atrophy. Negative for acute infarct. Mild chronic white matter changes bilaterally. Mild chronic changes in the basilar. These are most consistent with chronic microvascular ischemia. Negative for intracranial hemorrhage or fluid  collection Negative for mass or edema. Mild mucosal edema paranasal sinuses. Bilateral lens replacement. No orbital mass. Image quality degraded by motion. MRA HEAD FINDINGS Image quality degraded by motion. Both vertebral arteries patent to the basilar. Fetal origin right posterior cerebral artery. Mild stenosis mid right posterior cerebral artery. Left posterior cerebral artery patent with mild stenosis distally. Superior cerebellar arteries patent bilaterally. PICA patent bilaterally. Irregularity of the cavernous carotid artery bilaterally with decreased signal bilaterally. This could be due to stenosis or artifact due to tortuosity. Anterior and middle cerebral arteries patent bilaterally without significant stenosis. Hypoplastic right A1 segment. Both anterior cerebral arteries supplied from the left. Negative for vascular malformation or aneurysm. IMPRESSION: Image quality degraded by motion Negative for acute infarct Atrophy with mild chronic microvascular ischemia Bilateral posterior cerebral artery stenoses. Probable stenosis of the cavernous carotid bilaterally, difficult to quantitate due to tortuosity and motion. Electronically Signed   By: Franchot Gallo M.D.   On: 03/02/2016 11:02       Assessment & Plan:   Problem List Items Addressed This Visit    Depression    Followed by Dr Nicolasa Ducking.  On zoloft.  Follow.       Dizziness - Primary    Dizziness as outlined.  Recent hospitalization.  See notes for work up.  Scans reviewed.  Is scheduled to see ENT for further w/up and question of Meniere's disease.  Symptoms have improved.  Still with dizziness.  Using a walker.  Discussed neurology follow up.  She declines at this time.        Relevant Orders   CBC with Differential/Platelet (Completed)   Basic metabolic panel (Completed)   Essential hypertension, benign    Blood pressure under good control.  Continue same medication regimen.  Follow pressures.  Follow metabolic panel.         Headache    Persistent headache, burning sensation in her nose, etc.  Discussed f/u with neurology.  She declines at this time.  Headache had resolved with sinus surgery previously.   Keep ENT appt.       Hypercholesterolemia    Low cholesterol diet and exercise.  Follow lipid panel and liver function tests.  On lipitor.  Einar Pheasant, MD

## 2016-03-15 DIAGNOSIS — R42 Dizziness and giddiness: Secondary | ICD-10-CM | POA: Diagnosis not present

## 2016-03-15 DIAGNOSIS — M25511 Pain in right shoulder: Secondary | ICD-10-CM | POA: Diagnosis not present

## 2016-03-15 DIAGNOSIS — F329 Major depressive disorder, single episode, unspecified: Secondary | ICD-10-CM | POA: Diagnosis not present

## 2016-03-15 DIAGNOSIS — G8929 Other chronic pain: Secondary | ICD-10-CM | POA: Diagnosis not present

## 2016-03-15 DIAGNOSIS — R2689 Other abnormalities of gait and mobility: Secondary | ICD-10-CM | POA: Diagnosis not present

## 2016-03-15 DIAGNOSIS — F0390 Unspecified dementia without behavioral disturbance: Secondary | ICD-10-CM | POA: Diagnosis not present

## 2016-03-15 LAB — BASIC METABOLIC PANEL
BUN: 23 mg/dL (ref 6–23)
CO2: 29 mEq/L (ref 19–32)
CREATININE: 0.94 mg/dL (ref 0.40–1.20)
Calcium: 10 mg/dL (ref 8.4–10.5)
Chloride: 100 mEq/L (ref 96–112)
GFR: 59.73 mL/min — AB (ref >60.00)
GLUCOSE: 103 mg/dL — AB (ref 70–99)
Potassium: 3.8 mEq/L (ref 3.5–5.1)
Sodium: 139 mEq/L (ref 135–145)

## 2016-03-15 LAB — CBC WITH DIFFERENTIAL/PLATELET
BASOS ABS: 0.1 10*3/uL (ref 0.0–0.1)
BASOS PCT: 1.3 % (ref 0.0–3.0)
EOS ABS: 0.1 10*3/uL (ref 0.0–0.7)
EOS PCT: 1.9 % (ref 0.0–5.0)
HEMATOCRIT: 41.6 % (ref 36.0–46.0)
HEMOGLOBIN: 13.9 g/dL (ref 12.0–15.0)
LYMPHS ABS: 2.4 10*3/uL (ref 0.7–4.0)
Lymphocytes Relative: 32.9 % (ref 12.0–46.0)
MCHC: 33.3 g/dL (ref 30.0–36.0)
MCV: 98.4 fl (ref 78.0–100.0)
MONO ABS: 0.5 10*3/uL (ref 0.1–1.0)
MONOS PCT: 6.9 % (ref 3.0–12.0)
NEUTROS ABS: 4.1 10*3/uL (ref 1.4–7.7)
Neutrophils Relative %: 57 % (ref 43.0–77.0)
Platelets: 289 10*3/uL (ref 150.0–400.0)
RBC: 4.23 Mil/uL (ref 3.87–5.11)
RDW: 12.3 % (ref 11.5–15.5)
WBC: 7.3 10*3/uL (ref 4.0–10.5)

## 2016-03-16 ENCOUNTER — Encounter: Payer: Self-pay | Admitting: Internal Medicine

## 2016-03-16 NOTE — Assessment & Plan Note (Signed)
Followed by Dr Nicolasa Ducking.  On zoloft.  Follow.

## 2016-03-16 NOTE — Assessment & Plan Note (Signed)
Blood pressure under good control.  Continue same medication regimen.  Follow pressures.  Follow metabolic panel.   

## 2016-03-16 NOTE — Assessment & Plan Note (Signed)
Low cholesterol diet and exercise.  Follow lipid panel and liver function tests.  On lipitor.   

## 2016-03-16 NOTE — Assessment & Plan Note (Signed)
Dizziness as outlined.  Recent hospitalization.  See notes for work up.  Scans reviewed.  Is scheduled to see ENT for further w/up and question of Meniere's disease.  Symptoms have improved.  Still with dizziness.  Using a walker.  Discussed neurology follow up.  She declines at this time.

## 2016-03-16 NOTE — Assessment & Plan Note (Signed)
Persistent headache, burning sensation in her nose, etc.  Discussed f/u with neurology.  She declines at this time.  Headache had resolved with sinus surgery previously.   Keep ENT appt.

## 2016-03-18 ENCOUNTER — Encounter: Payer: Self-pay | Admitting: *Deleted

## 2016-03-18 DIAGNOSIS — M25511 Pain in right shoulder: Secondary | ICD-10-CM | POA: Diagnosis not present

## 2016-03-18 DIAGNOSIS — F329 Major depressive disorder, single episode, unspecified: Secondary | ICD-10-CM | POA: Diagnosis not present

## 2016-03-18 DIAGNOSIS — R42 Dizziness and giddiness: Secondary | ICD-10-CM | POA: Diagnosis not present

## 2016-03-18 DIAGNOSIS — F0390 Unspecified dementia without behavioral disturbance: Secondary | ICD-10-CM | POA: Diagnosis not present

## 2016-03-18 DIAGNOSIS — R2689 Other abnormalities of gait and mobility: Secondary | ICD-10-CM | POA: Diagnosis not present

## 2016-03-18 DIAGNOSIS — G8929 Other chronic pain: Secondary | ICD-10-CM | POA: Diagnosis not present

## 2016-03-19 DIAGNOSIS — G8929 Other chronic pain: Secondary | ICD-10-CM | POA: Diagnosis not present

## 2016-03-19 DIAGNOSIS — R42 Dizziness and giddiness: Secondary | ICD-10-CM | POA: Diagnosis not present

## 2016-03-19 DIAGNOSIS — M25511 Pain in right shoulder: Secondary | ICD-10-CM | POA: Diagnosis not present

## 2016-03-19 DIAGNOSIS — F0390 Unspecified dementia without behavioral disturbance: Secondary | ICD-10-CM | POA: Diagnosis not present

## 2016-03-19 DIAGNOSIS — F329 Major depressive disorder, single episode, unspecified: Secondary | ICD-10-CM | POA: Diagnosis not present

## 2016-03-19 DIAGNOSIS — R2689 Other abnormalities of gait and mobility: Secondary | ICD-10-CM | POA: Diagnosis not present

## 2016-03-20 DIAGNOSIS — M25511 Pain in right shoulder: Secondary | ICD-10-CM | POA: Diagnosis not present

## 2016-03-20 DIAGNOSIS — H8111 Benign paroxysmal vertigo, right ear: Secondary | ICD-10-CM | POA: Diagnosis not present

## 2016-03-20 DIAGNOSIS — J310 Chronic rhinitis: Secondary | ICD-10-CM | POA: Diagnosis not present

## 2016-03-20 DIAGNOSIS — F329 Major depressive disorder, single episode, unspecified: Secondary | ICD-10-CM | POA: Diagnosis not present

## 2016-03-20 DIAGNOSIS — F0390 Unspecified dementia without behavioral disturbance: Secondary | ICD-10-CM | POA: Diagnosis not present

## 2016-03-20 DIAGNOSIS — H900 Conductive hearing loss, bilateral: Secondary | ICD-10-CM | POA: Diagnosis not present

## 2016-03-20 DIAGNOSIS — J343 Hypertrophy of nasal turbinates: Secondary | ICD-10-CM | POA: Diagnosis not present

## 2016-03-20 DIAGNOSIS — R2689 Other abnormalities of gait and mobility: Secondary | ICD-10-CM | POA: Diagnosis not present

## 2016-03-20 DIAGNOSIS — R42 Dizziness and giddiness: Secondary | ICD-10-CM | POA: Diagnosis not present

## 2016-03-20 DIAGNOSIS — G8929 Other chronic pain: Secondary | ICD-10-CM | POA: Diagnosis not present

## 2016-03-20 DIAGNOSIS — H903 Sensorineural hearing loss, bilateral: Secondary | ICD-10-CM | POA: Diagnosis not present

## 2016-03-21 DIAGNOSIS — M25511 Pain in right shoulder: Secondary | ICD-10-CM | POA: Diagnosis not present

## 2016-03-21 DIAGNOSIS — R2689 Other abnormalities of gait and mobility: Secondary | ICD-10-CM | POA: Diagnosis not present

## 2016-03-21 DIAGNOSIS — F329 Major depressive disorder, single episode, unspecified: Secondary | ICD-10-CM | POA: Diagnosis not present

## 2016-03-21 DIAGNOSIS — F0390 Unspecified dementia without behavioral disturbance: Secondary | ICD-10-CM | POA: Diagnosis not present

## 2016-03-21 DIAGNOSIS — G8929 Other chronic pain: Secondary | ICD-10-CM | POA: Diagnosis not present

## 2016-03-21 DIAGNOSIS — R42 Dizziness and giddiness: Secondary | ICD-10-CM | POA: Diagnosis not present

## 2016-03-22 DIAGNOSIS — M25511 Pain in right shoulder: Secondary | ICD-10-CM | POA: Diagnosis not present

## 2016-03-22 DIAGNOSIS — R2689 Other abnormalities of gait and mobility: Secondary | ICD-10-CM | POA: Diagnosis not present

## 2016-03-22 DIAGNOSIS — G8929 Other chronic pain: Secondary | ICD-10-CM | POA: Diagnosis not present

## 2016-03-22 DIAGNOSIS — F0390 Unspecified dementia without behavioral disturbance: Secondary | ICD-10-CM | POA: Diagnosis not present

## 2016-03-22 DIAGNOSIS — R42 Dizziness and giddiness: Secondary | ICD-10-CM | POA: Diagnosis not present

## 2016-03-22 DIAGNOSIS — F329 Major depressive disorder, single episode, unspecified: Secondary | ICD-10-CM | POA: Diagnosis not present

## 2016-03-25 DIAGNOSIS — R42 Dizziness and giddiness: Secondary | ICD-10-CM | POA: Diagnosis not present

## 2016-03-25 DIAGNOSIS — G8929 Other chronic pain: Secondary | ICD-10-CM | POA: Diagnosis not present

## 2016-03-25 DIAGNOSIS — M25511 Pain in right shoulder: Secondary | ICD-10-CM | POA: Diagnosis not present

## 2016-03-25 DIAGNOSIS — F0390 Unspecified dementia without behavioral disturbance: Secondary | ICD-10-CM | POA: Diagnosis not present

## 2016-03-25 DIAGNOSIS — F329 Major depressive disorder, single episode, unspecified: Secondary | ICD-10-CM | POA: Diagnosis not present

## 2016-03-25 DIAGNOSIS — R2689 Other abnormalities of gait and mobility: Secondary | ICD-10-CM | POA: Diagnosis not present

## 2016-03-26 DIAGNOSIS — F329 Major depressive disorder, single episode, unspecified: Secondary | ICD-10-CM | POA: Diagnosis not present

## 2016-03-26 DIAGNOSIS — F0390 Unspecified dementia without behavioral disturbance: Secondary | ICD-10-CM | POA: Diagnosis not present

## 2016-03-26 DIAGNOSIS — M25511 Pain in right shoulder: Secondary | ICD-10-CM | POA: Diagnosis not present

## 2016-03-26 DIAGNOSIS — R42 Dizziness and giddiness: Secondary | ICD-10-CM | POA: Diagnosis not present

## 2016-03-26 DIAGNOSIS — G8929 Other chronic pain: Secondary | ICD-10-CM | POA: Diagnosis not present

## 2016-03-26 DIAGNOSIS — R2689 Other abnormalities of gait and mobility: Secondary | ICD-10-CM | POA: Diagnosis not present

## 2016-03-27 ENCOUNTER — Other Ambulatory Visit: Payer: Self-pay | Admitting: Internal Medicine

## 2016-03-27 DIAGNOSIS — G8929 Other chronic pain: Secondary | ICD-10-CM | POA: Diagnosis not present

## 2016-03-27 DIAGNOSIS — R42 Dizziness and giddiness: Secondary | ICD-10-CM | POA: Diagnosis not present

## 2016-03-27 DIAGNOSIS — R2689 Other abnormalities of gait and mobility: Secondary | ICD-10-CM | POA: Diagnosis not present

## 2016-03-27 DIAGNOSIS — F329 Major depressive disorder, single episode, unspecified: Secondary | ICD-10-CM | POA: Diagnosis not present

## 2016-03-27 DIAGNOSIS — M25511 Pain in right shoulder: Secondary | ICD-10-CM | POA: Diagnosis not present

## 2016-03-27 DIAGNOSIS — F0390 Unspecified dementia without behavioral disturbance: Secondary | ICD-10-CM | POA: Diagnosis not present

## 2016-03-28 DIAGNOSIS — R42 Dizziness and giddiness: Secondary | ICD-10-CM | POA: Diagnosis not present

## 2016-03-28 DIAGNOSIS — F0390 Unspecified dementia without behavioral disturbance: Secondary | ICD-10-CM | POA: Diagnosis not present

## 2016-03-28 DIAGNOSIS — M25511 Pain in right shoulder: Secondary | ICD-10-CM | POA: Diagnosis not present

## 2016-03-28 DIAGNOSIS — F329 Major depressive disorder, single episode, unspecified: Secondary | ICD-10-CM | POA: Diagnosis not present

## 2016-03-28 DIAGNOSIS — G8929 Other chronic pain: Secondary | ICD-10-CM | POA: Diagnosis not present

## 2016-03-28 DIAGNOSIS — R2689 Other abnormalities of gait and mobility: Secondary | ICD-10-CM | POA: Diagnosis not present

## 2016-04-01 DIAGNOSIS — F329 Major depressive disorder, single episode, unspecified: Secondary | ICD-10-CM | POA: Diagnosis not present

## 2016-04-01 DIAGNOSIS — R2689 Other abnormalities of gait and mobility: Secondary | ICD-10-CM | POA: Diagnosis not present

## 2016-04-01 DIAGNOSIS — R42 Dizziness and giddiness: Secondary | ICD-10-CM | POA: Diagnosis not present

## 2016-04-01 DIAGNOSIS — F0390 Unspecified dementia without behavioral disturbance: Secondary | ICD-10-CM | POA: Diagnosis not present

## 2016-04-01 DIAGNOSIS — G8929 Other chronic pain: Secondary | ICD-10-CM | POA: Diagnosis not present

## 2016-04-01 DIAGNOSIS — M25511 Pain in right shoulder: Secondary | ICD-10-CM | POA: Diagnosis not present

## 2016-04-01 NOTE — Progress Notes (Signed)
   03/04/16 1300  SLP G-Codes **NOT FOR INPATIENT CLASS**  Functional Assessment Tool Used (clinical judgement)  Functional Limitations Spoken language comprehension  Spoken Language Comprehension Current Status [phone]) CI  Spoken Language Comprehension Goal Status (G9160) CI  Spoken Language Comprehension Discharge Status [id number]) CI  SLP Evaluations  $ SLP Speech Visit 1 Procedure  SLP Evaluations  $Cognitive Skills Development 8-22  $ SLP EVAL LANGUAGE/SOUND PRODUCTION 1 Procedure

## 2016-04-02 DIAGNOSIS — M25511 Pain in right shoulder: Secondary | ICD-10-CM | POA: Diagnosis not present

## 2016-04-02 DIAGNOSIS — F329 Major depressive disorder, single episode, unspecified: Secondary | ICD-10-CM | POA: Diagnosis not present

## 2016-04-02 DIAGNOSIS — R2689 Other abnormalities of gait and mobility: Secondary | ICD-10-CM | POA: Diagnosis not present

## 2016-04-02 DIAGNOSIS — F0390 Unspecified dementia without behavioral disturbance: Secondary | ICD-10-CM | POA: Diagnosis not present

## 2016-04-02 DIAGNOSIS — R42 Dizziness and giddiness: Secondary | ICD-10-CM | POA: Diagnosis not present

## 2016-04-02 DIAGNOSIS — G8929 Other chronic pain: Secondary | ICD-10-CM | POA: Diagnosis not present

## 2016-04-24 DIAGNOSIS — J343 Hypertrophy of nasal turbinates: Secondary | ICD-10-CM | POA: Diagnosis not present

## 2016-04-24 DIAGNOSIS — J310 Chronic rhinitis: Secondary | ICD-10-CM | POA: Diagnosis not present

## 2016-04-24 DIAGNOSIS — R42 Dizziness and giddiness: Secondary | ICD-10-CM | POA: Diagnosis not present

## 2016-04-24 DIAGNOSIS — H8111 Benign paroxysmal vertigo, right ear: Secondary | ICD-10-CM | POA: Diagnosis not present

## 2016-05-03 ENCOUNTER — Ambulatory Visit: Payer: Medicare Other | Admitting: Neurology

## 2016-05-14 ENCOUNTER — Ambulatory Visit: Payer: Medicare Other | Admitting: Internal Medicine

## 2016-05-16 ENCOUNTER — Other Ambulatory Visit: Payer: Self-pay | Admitting: Internal Medicine

## 2016-05-31 ENCOUNTER — Ambulatory Visit (INDEPENDENT_AMBULATORY_CARE_PROVIDER_SITE_OTHER): Payer: Medicare Other | Admitting: Internal Medicine

## 2016-05-31 ENCOUNTER — Encounter: Payer: Self-pay | Admitting: Internal Medicine

## 2016-05-31 VITALS — BP 138/70 | HR 88 | Temp 98.0°F | Resp 18 | Ht 62.0 in | Wt 151.5 lb

## 2016-05-31 DIAGNOSIS — R6889 Other general symptoms and signs: Secondary | ICD-10-CM

## 2016-05-31 DIAGNOSIS — H547 Unspecified visual loss: Secondary | ICD-10-CM

## 2016-05-31 DIAGNOSIS — E785 Hyperlipidemia, unspecified: Secondary | ICD-10-CM

## 2016-05-31 DIAGNOSIS — R739 Hyperglycemia, unspecified: Secondary | ICD-10-CM | POA: Diagnosis not present

## 2016-05-31 DIAGNOSIS — R5383 Other fatigue: Secondary | ICD-10-CM | POA: Diagnosis not present

## 2016-05-31 DIAGNOSIS — E7800 Pure hypercholesterolemia, unspecified: Secondary | ICD-10-CM | POA: Diagnosis not present

## 2016-05-31 DIAGNOSIS — F32A Depression, unspecified: Secondary | ICD-10-CM

## 2016-05-31 DIAGNOSIS — M25512 Pain in left shoulder: Secondary | ICD-10-CM | POA: Diagnosis not present

## 2016-05-31 DIAGNOSIS — I10 Essential (primary) hypertension: Secondary | ICD-10-CM

## 2016-05-31 DIAGNOSIS — K219 Gastro-esophageal reflux disease without esophagitis: Secondary | ICD-10-CM

## 2016-05-31 DIAGNOSIS — I639 Cerebral infarction, unspecified: Secondary | ICD-10-CM

## 2016-05-31 DIAGNOSIS — R748 Abnormal levels of other serum enzymes: Secondary | ICD-10-CM

## 2016-05-31 DIAGNOSIS — I272 Other secondary pulmonary hypertension: Secondary | ICD-10-CM

## 2016-05-31 DIAGNOSIS — F329 Major depressive disorder, single episode, unspecified: Secondary | ICD-10-CM

## 2016-05-31 DIAGNOSIS — M25511 Pain in right shoulder: Secondary | ICD-10-CM | POA: Diagnosis not present

## 2016-05-31 LAB — CBC WITH DIFFERENTIAL/PLATELET
BASOS PCT: 0.5 % (ref 0.0–3.0)
Basophils Absolute: 0 10*3/uL (ref 0.0–0.1)
EOS PCT: 1.2 % (ref 0.0–5.0)
Eosinophils Absolute: 0.1 10*3/uL (ref 0.0–0.7)
HEMATOCRIT: 40 % (ref 36.0–46.0)
HEMOGLOBIN: 13.4 g/dL (ref 12.0–15.0)
LYMPHS PCT: 26.3 % (ref 12.0–46.0)
Lymphs Abs: 2 10*3/uL (ref 0.7–4.0)
MCHC: 33.5 g/dL (ref 30.0–36.0)
MCV: 97.5 fl (ref 78.0–100.0)
MONO ABS: 0.4 10*3/uL (ref 0.1–1.0)
Monocytes Relative: 5.9 % (ref 3.0–12.0)
NEUTROS ABS: 4.9 10*3/uL (ref 1.4–7.7)
Neutrophils Relative %: 66.1 % (ref 43.0–77.0)
PLATELETS: 270 10*3/uL (ref 150.0–400.0)
RBC: 4.1 Mil/uL (ref 3.87–5.11)
RDW: 13 % (ref 11.5–15.5)
WBC: 7.4 10*3/uL (ref 4.0–10.5)

## 2016-05-31 LAB — BASIC METABOLIC PANEL
BUN: 20 mg/dL (ref 6–23)
CALCIUM: 9.5 mg/dL (ref 8.4–10.5)
CHLORIDE: 106 meq/L (ref 96–112)
CO2: 29 meq/L (ref 19–32)
Creatinine, Ser: 1.11 mg/dL (ref 0.40–1.20)
GFR: 49.28 mL/min — ABNORMAL LOW (ref >60.00)
GLUCOSE: 101 mg/dL — AB (ref 70–99)
POTASSIUM: 4.3 meq/L (ref 3.5–5.1)
SODIUM: 142 meq/L (ref 135–145)

## 2016-05-31 LAB — HEMOGLOBIN A1C: HEMOGLOBIN A1C: 5.8 % (ref 4.6–6.5)

## 2016-05-31 LAB — LIPID PANEL
Cholesterol: 168 mg/dL (ref 0–200)
HDL: 77 mg/dL (ref >39.00)
LDL CALC: 70 mg/dL (ref 0–99)
NONHDL: 90.67
Total CHOL/HDL Ratio: 2
Triglycerides: 101 mg/dL (ref 0.0–149.0)
VLDL: 20.2 mg/dL (ref 0.0–40.0)

## 2016-05-31 LAB — HEPATIC FUNCTION PANEL
ALT: 14 U/L (ref 0–35)
AST: 17 U/L (ref 0–37)
Albumin: 4.3 g/dL (ref 3.5–5.2)
Alkaline Phosphatase: 111 U/L (ref 39–117)
BILIRUBIN DIRECT: 0.1 mg/dL (ref 0.0–0.3)
BILIRUBIN TOTAL: 0.4 mg/dL (ref 0.2–1.2)
Total Protein: 7.2 g/dL (ref 6.0–8.3)

## 2016-05-31 LAB — SEDIMENTATION RATE: Sed Rate: 11 mm/hr (ref 0–30)

## 2016-05-31 NOTE — Progress Notes (Signed)
Patient ID: [name], female   DOB: 08/22/1926, 82 y.o.   MRN: [id number]   Subjective:    Patient ID: [name], female    DOB: 1926-01-26, 82 y.o.   MRN: [id number]  HPI  Patient here for a scheduled follow up.  She is accompanied by her daughter.  History obtained from both of them.  She reports some persistent pain in her shoulder.  Daughter reports has been evaluated.  States bone on bone.  Not a surgical candidate.  No chest pain.  Breathing stable.  Issues with depression.  Seeing psychiatry.  Daughter plans to discuss some concerns regarding depression with psychiatry.  No suicidal ideations.  Eyesight limitation.  Followed by opthalmology.  No abdominal pain.  Bowels stable.     Past Medical History:  Diagnosis Date  . Arthritis   . Cancer [facility])    uterine and questionable vulvar, s/p hysterectomy  . Chronic headaches   . Cystocele   . Dementia    able to sign own papers  . GERD (gastroesophageal reflux disease)   . Glaucoma   . Hypercholesterolemia   . Hypertension   . Macular degeneration   . Reactive airway disease   . Rectocele   . Right bundle branch block   . Vertigo    none - several yrs  . Wears dentures    full upper and lower   Past Surgical History:  Procedure Laterality Date  . ABDOMINAL HYSTERECTOMY    . ABDOMINAL HYSTERECTOMY    . APPENDECTOMY    . CHOLECYSTECTOMY    . IMAGE GUIDED SINUS SURGERY N/A 06/20/2015   Procedure: IMAGE GUIDED SINUS SURGERY;  Surgeon: Clyde Canterbury, MD;  Location: [place];  Service: ENT;  Laterality: N/A;  GAVE DISK TO CE CE  . REPLACEMENT TOTAL KNEE Left   . SPHENOIDECTOMY Bilateral 06/20/2015   Procedure: SPHENOIDECTOMY;  Surgeon: Clyde Canterbury, MD;  Location: [place];  Service: ENT;  Laterality: Bilateral;  . VULVECTOMY     Family History  Problem Relation Age of Onset  . Heart disease Mother   . Cancer Father     leukemia  . Heart disease Brother     x2  . Cancer Daughter     breast    Social History   Social History  . Marital status: Married    Spouse name: N/A  . Number of children: N/A  . Years of education: N/A   Social History Main Topics  . Smoking status: Never Smoker  . Smokeless tobacco: Never Used  . Alcohol use No  . Drug use: No  . Sexual activity: No   Other Topics Concern  . None   Social History Narrative   ** Merged History Encounter **        Outpatient Encounter Prescriptions as of 05/31/2016  Medication Sig  . Aspirin-Salicylamide-Caffeine (BC HEADACHE POWDER PO) Take 1 packet by mouth daily as needed (pain/headache).  [name] atorvastatin (LIPITOR) 40 MG tablet TAKE 1 TABLET AT 6PM  . brimonidine (ALPHAGAN P) 0.1 % SOLN Place 1 drop into both eyes 2 (two) times daily.  . brinzolamide (AZOPT) 1 % ophthalmic suspension Place 1 drop into the left eye 3 (three) times daily.  . fluticasone (FLONASE) 50 MCG/ACT nasal spray Place 2 sprays into both nostrils daily. (Patient taking differently: Place 2 sprays into both nostrils daily as needed (seasonal allergies). )  . hydrochlorothiazide (MICROZIDE) 12.5 MG capsule Take 1 capsule (12.5 mg total) by mouth  daily.  . latanoprost (XALATAN) 0.005 % ophthalmic solution Place 1 drop into both eyes at bedtime.  . meclizine (ANTIVERT) 25 MG tablet Take 1 tablet (25 mg total) by mouth 3 (three) times daily as needed for dizziness.  . memantine (NAMENDA) 10 MG tablet Take 10 mg by mouth 2 (two) times daily.   . Multiple Vitamin (MULTIVITAMIN WITH MINERALS) TABS tablet Take 1 tablet by mouth daily.  . Multiple Vitamins-Minerals (PRESERVISION AREDS) TABS Take 1 tablet by mouth daily.   . Omega-3 Fatty Acids (FISH OIL CONCENTRATE PO) Take 1 capsule by mouth daily.   [name] omeprazole (PRILOSEC) 20 MG capsule TAKE ONE CAPSULE EVERY MORNING  . oxybutynin (OXYTROL) 3.9 MG/24HR Place 1 patch onto the skin every 3 (three) days.  . promethazine (PHENERGAN) 12.5 MG tablet Take 1 tablet (12.5 mg total) by mouth every 6 (six)  hours as needed for nausea or vomiting.  . rosuvastatin (CRESTOR) 20 MG tablet Take 1 tablet (20 mg total) by mouth at bedtime.  . sertraline (ZOLOFT) 100 MG tablet Take 150 mg by mouth daily.   . SERTRALINE HCL PO Take 100 mg by mouth daily.  . traMADol (ULTRAM) 50 MG tablet Take 25 mg by mouth 2 (two) times daily.  . travoprost, benzalkonium, (TRAVATAN) 0.004 % ophthalmic solution Place 1 drop into both eyes at bedtime.  . traZODone (DESYREL) 150 MG tablet Take 150 mg by mouth at bedtime.  . vitamin D, CHOLECALCIFEROL, 400 UNITS tablet Take 400 Units by mouth daily.   No facility-administered encounter medications on file as of 05/31/2016.     Review of Systems  Constitutional: Negative for appetite change and unexpected weight change.  HENT: Negative for congestion and sinus pressure.   Respiratory: Negative for cough, chest tightness and shortness of breath.   Cardiovascular: Negative for chest pain, palpitations and leg swelling.  Gastrointestinal: Negative for abdominal pain, diarrhea, nausea and vomiting.  Genitourinary: Negative for difficulty urinating and dysuria.  Musculoskeletal:       Shoulder pain as outlined.    Skin: Negative for color change and rash.  Neurological: Negative for dizziness.       No significant headaches now.    Psychiatric/Behavioral: Negative for suicidal ideas.       Some increased depression as outlined.         Objective:    Physical Exam  Constitutional: She appears well-developed and well-nourished. No distress.  HENT:  Nose: Nose normal.  Mouth/Throat: Oropharynx is clear and moist.  Neck: Neck supple. No thyromegaly present.  Cardiovascular: Normal rate and regular rhythm.   Pulmonary/Chest: Breath sounds normal. No respiratory distress. She has no wheezes.  Abdominal: Soft. Bowel sounds are normal. There is no tenderness.  Musculoskeletal: She exhibits no edema or tenderness.  Lymphadenopathy:    She has no cervical adenopathy.    Skin: No rash noted. No erythema.  Psychiatric: She has a normal mood and affect. Her behavior is normal.    BP 138/70   Pulse 88   Temp 98 F (36.7 C) (Oral)   Resp 18   Ht '5\' 2"'$  (1.575 m)   Wt 151 lb 8 oz (68.7 kg)   SpO2 93%   BMI 27.71 kg/m  Wt Readings from Last 3 Encounters:  05/31/16 151 lb 8 oz (68.7 kg)  03/14/16 149 lb 9.6 oz (67.9 kg)  03/01/16 152 lb 14.4 oz (69.4 kg)     Lab Results  Component Value Date   WBC 7.3 03/14/2016  HGB 13.9 03/14/2016   HCT 41.6 03/14/2016   PLT 289.0 03/14/2016   GLUCOSE 103 (H) 03/14/2016   CHOL 132 03/02/2016   TRIG 63 03/02/2016   HDL 69 03/02/2016   LDLCALC 50 03/02/2016   ALT 16 01/15/2016   AST 21 01/15/2016   NA 139 03/14/2016   K 3.8 03/14/2016   CL 100 03/14/2016   CREATININE 0.94 03/14/2016   BUN 23 03/14/2016   CO2 29 03/14/2016   TSH 2.81 07/03/2015   INR 1.07 01/15/2016   HGBA1C 6.2 (H) 03/02/2016   MICROALBUR 1.0 03/01/2015    Dg Chest 2 View  Result Date: 03/01/2016 CLINICAL DATA:  Recent stroke-like episode EXAM: CHEST  2 VIEW COMPARISON:  None. FINDINGS: The heart size and mediastinal contours are within normal limits. Both lungs are clear. The visualized skeletal structures are unremarkable. IMPRESSION: No active cardiopulmonary disease. Electronically Signed   By: Inez Catalina M.D.   On: 03/01/2016 21:59   Ct Head Wo Contrast  Result Date: 03/01/2016 CLINICAL DATA:  Headache 2 weeks with dizziness today. EXAM: CT HEAD WITHOUT CONTRAST TECHNIQUE: Contiguous axial images were obtained from the base of the skull through the vertex without intravenous contrast. COMPARISON:  None. FINDINGS: Ventricles, cisterns and other CSF spaces are within normal. There is mild chronic ischemic microvascular disease. There is no mass, mass effect, shift of midline structures or acute hemorrhage. No evidence of acute infarction. Remaining bones and soft tissues are within normal. IMPRESSION: No acute intracranial  findings. Mild chronic ischemic microvascular disease. Electronically Signed   By: Marin Olp M.D.   On: 03/01/2016 15:19   Mr Brain Wo Contrast  Result Date: 03/02/2016 CLINICAL DATA:  Vertigo EXAM: MRI HEAD WITHOUT CONTRAST MRA HEAD WITHOUT CONTRAST TECHNIQUE: Multiplanar, multiecho pulse sequences of the brain and surrounding structures were obtained without intravenous contrast. Angiographic images of the head were obtained using MRA technique without contrast. COMPARISON:  CT head 03/01/2016 FINDINGS: MRI HEAD FINDINGS Generalized atrophy. Ventricular enlargement consistent with atrophy. Negative for acute infarct. Mild chronic white matter changes bilaterally. Mild chronic changes in the basilar. These are most consistent with chronic microvascular ischemia. Negative for intracranial hemorrhage or fluid collection Negative for mass or edema. Mild mucosal edema paranasal sinuses. Bilateral lens replacement. No orbital mass. Image quality degraded by motion. MRA HEAD FINDINGS Image quality degraded by motion. Both vertebral arteries patent to the basilar. Fetal origin right posterior cerebral artery. Mild stenosis mid right posterior cerebral artery. Left posterior cerebral artery patent with mild stenosis distally. Superior cerebellar arteries patent bilaterally. PICA patent bilaterally. Irregularity of the cavernous carotid artery bilaterally with decreased signal bilaterally. This could be due to stenosis or artifact due to tortuosity. Anterior and middle cerebral arteries patent bilaterally without significant stenosis. Hypoplastic right A1 segment. Both anterior cerebral arteries supplied from the left. Negative for vascular malformation or aneurysm. IMPRESSION: Image quality degraded by motion Negative for acute infarct Atrophy with mild chronic microvascular ischemia Bilateral posterior cerebral artery stenoses. Probable stenosis of the cavernous carotid bilaterally, difficult to quantitate due to  tortuosity and motion. Electronically Signed   By: Franchot Gallo M.D.   On: 03/02/2016 11:02   Mr Jodene Nam Head/brain ZO Cm  Result Date: 03/02/2016 CLINICAL DATA:  Vertigo EXAM: MRI HEAD WITHOUT CONTRAST MRA HEAD WITHOUT CONTRAST TECHNIQUE: Multiplanar, multiecho pulse sequences of the brain and surrounding structures were obtained without intravenous contrast. Angiographic images of the head were obtained using MRA technique without contrast. COMPARISON:  CT head 03/01/2016  FINDINGS: MRI HEAD FINDINGS Generalized atrophy. Ventricular enlargement consistent with atrophy. Negative for acute infarct. Mild chronic white matter changes bilaterally. Mild chronic changes in the basilar. These are most consistent with chronic microvascular ischemia. Negative for intracranial hemorrhage or fluid collection Negative for mass or edema. Mild mucosal edema paranasal sinuses. Bilateral lens replacement. No orbital mass. Image quality degraded by motion. MRA HEAD FINDINGS Image quality degraded by motion. Both vertebral arteries patent to the basilar. Fetal origin right posterior cerebral artery. Mild stenosis mid right posterior cerebral artery. Left posterior cerebral artery patent with mild stenosis distally. Superior cerebellar arteries patent bilaterally. PICA patent bilaterally. Irregularity of the cavernous carotid artery bilaterally with decreased signal bilaterally. This could be due to stenosis or artifact due to tortuosity. Anterior and middle cerebral arteries patent bilaterally without significant stenosis. Hypoplastic right A1 segment. Both anterior cerebral arteries supplied from the left. Negative for vascular malformation or aneurysm. IMPRESSION: Image quality degraded by motion Negative for acute infarct Atrophy with mild chronic microvascular ischemia Bilateral posterior cerebral artery stenoses. Probable stenosis of the cavernous carotid bilaterally, difficult to quantitate due to tortuosity and motion.  Electronically Signed   By: Franchot Gallo M.D.   On: 03/02/2016 11:02       Assessment & Plan:   Problem List Items Addressed This Visit    Depression    Followed by psychiatry.  Remains on zoloft.  Discussed with daughter.  Plans to discuss with psychiatry regarding some increased depression.  No suicidal ideations.        Elevated alkaline phosphatase level    Follow liver panel.        Essential hypertension, benign    Blood pressure under good control.  Continue same medication regimen.  Follow pressures.  Follow metabolic panel.        GERD (gastroesophageal reflux disease)    Symptoms controlled on omeprazole.  Follow.        Hypercholesterolemia    On crestor.  Low cholesterol diet and exercise.  Follow lipid panel and liver function tests.        Relevant Orders   Lipid panel   Hepatic function panel   Hyperglycemia    Low carb diet and exercise.  Follow met b and a1c.       Relevant Orders   Basic metabolic panel   Hemoglobin A1c   Hyperlipidemia - Primary    Low cholesterol diet and exercise.  Follow lipid panel.  On crestor.        Loss of vision    Followed by opthalmology.  Source of increased stress.  Follow.        Pulmonary hypertension ([place])    Has seen cardiology and pulmonary.  Stable.        Shoulder pain    Persistent.  Evaluated.  Bone on bone.  Not a surgical candidate.  Tylenol as directed.  Check esr.       Relevant Orders   Sedimentation rate    Other Visit Diagnoses    Cold intolerance       Relevant Orders   TSH   Other fatigue       Relevant Orders   CBC with Differential/Platelet       Einar Pheasant, MD

## 2016-05-31 NOTE — Progress Notes (Signed)
Pre-visit discussion using our clinic review tool. No additional management support is needed unless otherwise documented below in the visit note.  

## 2016-06-02 ENCOUNTER — Encounter: Payer: Self-pay | Admitting: Internal Medicine

## 2016-06-02 NOTE — Assessment & Plan Note (Signed)
Follow liver panel.  

## 2016-06-02 NOTE — Assessment & Plan Note (Signed)
On crestor.  Low cholesterol diet and exercise.  Follow lipid panel and liver function tests.   

## 2016-06-02 NOTE — Assessment & Plan Note (Addendum)
Persistent.  Evaluated.  Bone on bone.  Not a surgical candidate.  Tylenol as directed.  Check esr.

## 2016-06-02 NOTE — Assessment & Plan Note (Signed)
Low cholesterol diet and exercise.  Follow lipid panel.  On crestor.   

## 2016-06-02 NOTE — Assessment & Plan Note (Signed)
Blood pressure under good control.  Continue same medication regimen.  Follow pressures.  Follow metabolic panel.   

## 2016-06-02 NOTE — Assessment & Plan Note (Signed)
Low carb diet and exercise.  Follow met b and a1c.  

## 2016-06-02 NOTE — Assessment & Plan Note (Signed)
Symptoms controlled on omeprazole.  Follow.  

## 2016-06-02 NOTE — Assessment & Plan Note (Signed)
Has seen cardiology and pulmonary.  Stable.

## 2016-06-02 NOTE — Assessment & Plan Note (Signed)
Followed by psychiatry.  Remains on zoloft.  Discussed with daughter.  Plans to discuss with psychiatry regarding some increased depression.  No suicidal ideations.

## 2016-06-02 NOTE — Assessment & Plan Note (Signed)
Followed by opthalmology.  Source of increased stress.  Follow.

## 2016-06-03 LAB — TSH: TSH: 1.37 u[IU]/mL (ref 0.35–4.50)

## 2016-06-05 ENCOUNTER — Other Ambulatory Visit: Payer: Self-pay | Admitting: Internal Medicine

## 2016-06-05 DIAGNOSIS — R42 Dizziness and giddiness: Secondary | ICD-10-CM | POA: Diagnosis not present

## 2016-06-05 DIAGNOSIS — R7989 Other specified abnormal findings of blood chemistry: Secondary | ICD-10-CM

## 2016-06-05 DIAGNOSIS — H8143 Vertigo of central origin, bilateral: Secondary | ICD-10-CM | POA: Diagnosis not present

## 2016-06-07 ENCOUNTER — Telehealth: Payer: Self-pay | Admitting: *Deleted

## 2016-06-07 NOTE — Telephone Encounter (Signed)
Patients care giver was informed of results.  Patients care giver understood and no questions, comments, or concerns at this time.

## 2016-06-07 NOTE — Telephone Encounter (Signed)
[name] caretaker called and is inquiring about her lab results . She states that [place] called her. She is requesting a call back. Her number is [phone]. Please advise .

## 2016-06-11 DIAGNOSIS — F0280 Dementia in other diseases classified elsewhere without behavioral disturbance: Secondary | ICD-10-CM | POA: Diagnosis not present

## 2016-06-11 DIAGNOSIS — F330 Major depressive disorder, recurrent, mild: Secondary | ICD-10-CM | POA: Diagnosis not present

## 2016-06-13 ENCOUNTER — Other Ambulatory Visit (INDEPENDENT_AMBULATORY_CARE_PROVIDER_SITE_OTHER): Payer: Medicare Other

## 2016-06-13 DIAGNOSIS — R7989 Other specified abnormal findings of blood chemistry: Secondary | ICD-10-CM

## 2016-06-13 DIAGNOSIS — R748 Abnormal levels of other serum enzymes: Secondary | ICD-10-CM | POA: Diagnosis not present

## 2016-06-13 LAB — BASIC METABOLIC PANEL
BUN: 18 mg/dL (ref 6–23)
CALCIUM: 9.5 mg/dL (ref 8.4–10.5)
CHLORIDE: 106 meq/L (ref 96–112)
CO2: 32 meq/L (ref 19–32)
CREATININE: 0.97 mg/dL (ref 0.40–1.20)
GFR: 57.57 mL/min — ABNORMAL LOW (ref >60.00)
Glucose, Bld: 101 mg/dL — ABNORMAL HIGH (ref 70–99)
Potassium: 4.6 mEq/L (ref 3.5–5.1)
Sodium: 140 mEq/L (ref 135–145)

## 2016-06-14 ENCOUNTER — Telehealth: Payer: Self-pay | Admitting: Internal Medicine

## 2016-06-14 NOTE — Telephone Encounter (Signed)
This was documented on lab note. I spoke with [name] this morning & she if aware to f/o or contact ENT for refill request since she has seen ENT since hospital visit.

## 2016-06-14 NOTE — Telephone Encounter (Signed)
Pt returning office call from yesterday. She thinks it is for a refill. [phone].

## 2016-06-14 NOTE — Telephone Encounter (Signed)
Did you speak to husband yesterday.  We discussed this.  Had #180 pills with 2 refills.  Should not need.  If persistent issues, needs ENT evaluation.

## 2016-06-14 NOTE — Telephone Encounter (Signed)
Patients caregiver is checking on the status of antivert refill. Please advise.

## 2016-07-08 DIAGNOSIS — F330 Major depressive disorder, recurrent, mild: Secondary | ICD-10-CM | POA: Diagnosis not present

## 2016-07-08 DIAGNOSIS — F0280 Dementia in other diseases classified elsewhere without behavioral disturbance: Secondary | ICD-10-CM | POA: Diagnosis not present

## 2016-07-09 DIAGNOSIS — Z23 Encounter for immunization: Secondary | ICD-10-CM | POA: Diagnosis not present

## 2016-07-23 ENCOUNTER — Ambulatory Visit: Payer: Self-pay

## 2016-07-23 ENCOUNTER — Ambulatory Visit (INDEPENDENT_AMBULATORY_CARE_PROVIDER_SITE_OTHER): Payer: Medicare Other

## 2016-07-23 DIAGNOSIS — Z23 Encounter for immunization: Secondary | ICD-10-CM | POA: Diagnosis not present

## 2016-07-23 NOTE — Progress Notes (Signed)
Patient came in for pneumococcal 23 vaccine administered in left deltoid.  Patient tolerated well.

## 2016-07-24 ENCOUNTER — Other Ambulatory Visit: Payer: Self-pay | Admitting: Internal Medicine

## 2016-08-05 ENCOUNTER — Telehealth: Payer: Self-pay | Admitting: Internal Medicine

## 2016-08-05 NOTE — Telephone Encounter (Signed)
I called pt and left a vm to call office to sch AWV. Thank you! °

## 2016-08-06 NOTE — Progress Notes (Signed)
  I have reviewed the above information and agree with above.   Briane Birden, MD 

## 2016-08-19 ENCOUNTER — Other Ambulatory Visit: Payer: Self-pay | Admitting: Internal Medicine

## 2016-08-23 ENCOUNTER — Ambulatory Visit (INDEPENDENT_AMBULATORY_CARE_PROVIDER_SITE_OTHER): Payer: Medicare Other

## 2016-08-23 VITALS — BP 122/82 | HR 84 | Temp 97.7°F | Resp 14 | Ht 62.0 in | Wt 156.8 lb

## 2016-08-23 DIAGNOSIS — Z0000 Encounter for general adult medical examination without abnormal findings: Secondary | ICD-10-CM

## 2016-08-23 NOTE — Patient Instructions (Addendum)
[name] , Thank you for taking time to come for your Medicare Wellness Visit. I appreciate your ongoing commitment to your health goals. Please review the following plan we discussed and let me know if I can assist you in the future.   FOLLOW UP WITH DR. Nicki Reaper AS NEEDED.  These are the goals we discussed: Goals    . Increase water intake       This is a list of the screening recommended for you and due dates:  Health Maintenance  Topic Date Due  . Tetanus Vaccine  03/20/1947  . Shingles Vaccine  03/19/1988  . DEXA scan (bone density measurement)  03/19/1993  . Mammogram  12/09/2014  . Flu Shot  05/31/2017*  . Pneumonia vaccines (2 of 2 - PCV13) 07/23/2017  *Topic was postponed. The date shown is not the original due date.    Fall Prevention in the Home Introduction Falls can cause injuries. They can happen to people of all ages. There are many things you can do to make your home safe and to help prevent falls. What can I do on the outside of my home?  Regularly fix the edges of walkways and driveways and fix any cracks.  Remove anything that might make you trip as you walk through a door, such as a raised step or threshold.  Trim any bushes or trees on the path to your home.  Use bright outdoor lighting.  Clear any walking paths of anything that might make someone trip, such as rocks or tools.  Regularly check to see if handrails are loose or broken. Make sure that both sides of any steps have handrails.  Any raised decks and porches should have guardrails on the edges.  Have any leaves, snow, or ice cleared regularly.  Use sand or salt on walking paths during winter.  Clean up any spills in your garage right away. This includes oil or grease spills. What can I do in the bathroom?  Use night lights.  Install grab bars by the toilet and in the tub and shower. Do not use towel bars as grab bars.  Use non-skid mats or decals in the tub or shower.  If you need  to sit down in the shower, use a plastic, non-slip stool.  Keep the floor dry. Clean up any water that spills on the floor as soon as it happens.  Remove soap buildup in the tub or shower regularly.  Attach bath mats securely with double-sided non-slip rug tape.  Do not have throw rugs and other things on the floor that can make you trip. What can I do in the bedroom?  Use night lights.  Make sure that you have a light by your bed that is easy to reach.  Do not use any sheets or blankets that are too big for your bed. They should not hang down onto the floor.  Have a firm chair that has side arms. You can use this for support while you get dressed.  Do not have throw rugs and other things on the floor that can make you trip. What can I do in the kitchen?  Clean up any spills right away.  Avoid walking on wet floors.  Keep items that you use a lot in easy-to-reach places.  If you need to reach something above you, use a strong step stool that has a grab bar.  Keep electrical cords out of the way.  Do not use floor polish or wax that  makes floors slippery. If you must use wax, use non-skid floor wax.  Do not have throw rugs and other things on the floor that can make you trip. What can I do with my stairs?  Do not leave any items on the stairs.  Make sure that there are handrails on both sides of the stairs and use them. Fix handrails that are broken or loose. Make sure that handrails are as long as the stairways.  Check any carpeting to make sure that it is firmly attached to the stairs. Fix any carpet that is loose or worn.  Avoid having throw rugs at the top or bottom of the stairs. If you do have throw rugs, attach them to the floor with carpet tape.  Make sure that you have a light switch at the top of the stairs and the bottom of the stairs. If you do not have them, ask someone to add them for you. What else can I do to help prevent falls?  Wear shoes that:  Do  not have high heels.  Have rubber bottoms.  Are comfortable and fit you well.  Are closed at the toe. Do not wear sandals.  If you use a stepladder:  Make sure that it is fully opened. Do not climb a closed stepladder.  Make sure that both sides of the stepladder are locked into place.  Ask someone to hold it for you, if possible.  Clearly mark and make sure that you can see:  Any grab bars or handrails.  First and last steps.  Where the edge of each step is.  Use tools that help you move around (mobility aids) if they are needed. These include:  Canes.  Walkers.  Scooters.  Crutches.  Turn on the lights when you go into a dark area. Replace any light bulbs as soon as they burn out.  Set up your furniture so you have a clear path. Avoid moving your furniture around.  If any of your floors are uneven, fix them.  If there are any pets around you, be aware of where they are.  Review your medicines with your doctor. Some medicines can make you feel dizzy. This can increase your chance of falling. Ask your doctor what other things that you can do to help prevent falls. This information is not intended to replace advice given to you by your health care provider. Make sure you discuss any questions you have with your health care provider. Document Released: 07/20/2009 Document Revised: 02/29/2016 Document Reviewed: 10/28/2014  2017 [employer]

## 2016-08-23 NOTE — Progress Notes (Signed)
Subjective:   [name] is a 82 y.o. female who presents for an Initial Medicare Annual Wellness Visit.  Review of Systems    No ROS.  Medicare Wellness Visit.  Cardiac Risk Factors include: advanced age (>24men, >69 women);hypertension     Objective:    Today's Vitals   08/23/16 1050 08/23/16 1109  BP:  122/82  Pulse:  84  Resp:  14  Temp:  97.7 F (36.5 C)  TempSrc:  Oral  SpO2:  95%  Weight:  156 lb 12.8 oz (71.1 kg)  Height:  5\' 2"  (1.575 m)  PainSc: 5     Body mass index is 28.68 kg/m.   Current Medications (verified) Outpatient Encounter Prescriptions as of 08/23/2016  Medication Sig  . Aspirin-Salicylamide-Caffeine (BC HEADACHE POWDER PO) Take 1 packet by mouth daily as needed (pain/headache).  [name] atorvastatin (LIPITOR) 40 MG tablet TAKE 1 TABLET AT 6PM  . brimonidine (ALPHAGAN P) 0.1 % SOLN Place 1 drop into both eyes 2 (two) times daily.  . brinzolamide (AZOPT) 1 % ophthalmic suspension Place 1 drop into the left eye 3 (three) times daily.  . fluticasone (FLONASE) 50 MCG/ACT nasal spray Place 2 sprays into both nostrils daily. (Patient taking differently: Place 2 sprays into both nostrils daily as needed (seasonal allergies). )  . hydrochlorothiazide (MICROZIDE) 12.5 MG capsule Take 1 capsule (12.5 mg total) by mouth daily.  [name] latanoprost (XALATAN) 0.005 % ophthalmic solution Place 1 drop into both eyes at bedtime.  . meclizine (ANTIVERT) 25 MG tablet Take 1 tablet (25 mg total) by mouth 3 (three) times daily as needed for dizziness.  . memantine (NAMENDA) 10 MG tablet Take 10 mg by mouth 2 (two) times daily.   . Multiple Vitamin (MULTIVITAMIN WITH MINERALS) TABS tablet Take 1 tablet by mouth daily.  . Multiple Vitamins-Minerals (PRESERVISION AREDS) TABS Take 1 tablet by mouth daily.   . Omega-3 Fatty Acids (FISH OIL CONCENTRATE PO) Take 1 capsule by mouth daily.   [name] omeprazole (PRILOSEC) 20 MG capsule TAKE ONE CAPSULE BY MOUTH EVERY MORNING  . oxybutynin  (OXYTROL) 3.9 MG/24HR Place 1 patch onto the skin every 3 (three) days.  . rosuvastatin (CRESTOR) 20 MG tablet Take 1 tablet (20 mg total) by mouth at bedtime.  . sertraline (ZOLOFT) 100 MG tablet Take 150 mg by mouth daily.   . SERTRALINE HCL PO Take 100 mg by mouth daily.  . travoprost, benzalkonium, (TRAVATAN) 0.004 % ophthalmic solution Place 1 drop into both eyes at bedtime.  . vitamin D, CHOLECALCIFEROL, 400 UNITS tablet Take 400 Units by mouth daily.  . [DISCONTINUED] promethazine (PHENERGAN) 12.5 MG tablet Take 1 tablet (12.5 mg total) by mouth every 6 (six) hours as needed for nausea or vomiting.  . [DISCONTINUED] traMADol (ULTRAM) 50 MG tablet Take 25 mg by mouth 2 (two) times daily.  . [DISCONTINUED] traZODone (DESYREL) 150 MG tablet Take 150 mg by mouth at bedtime.   No facility-administered encounter medications on file as of 08/23/2016.     Allergies (verified) Ambien [zolpidem tartrate] and Codeine   History: Past Medical History:  Diagnosis Date  . Arthritis   . Cancer [facility])    uterine and questionable vulvar, s/p hysterectomy  . Chronic headaches   . Cystocele   . Dementia    able to sign own papers  . GERD (gastroesophageal reflux disease)   . Glaucoma   . Hypercholesterolemia   . Hypertension   . Macular degeneration   . Reactive airway  disease   . Rectocele   . Right bundle branch block   . Vertigo    none - several yrs  . Wears dentures    full upper and lower   Past Surgical History:  Procedure Laterality Date  . ABDOMINAL HYSTERECTOMY    . ABDOMINAL HYSTERECTOMY    . APPENDECTOMY    . CHOLECYSTECTOMY    . IMAGE GUIDED SINUS SURGERY N/A 06/20/2015   Procedure: IMAGE GUIDED SINUS SURGERY;  Surgeon: Clyde Canterbury, MD;  Location: [place];  Service: ENT;  Laterality: N/A;  GAVE DISK TO CE CE  . REPLACEMENT TOTAL KNEE Left   . SPHENOIDECTOMY Bilateral 06/20/2015   Procedure: SPHENOIDECTOMY;  Surgeon: Clyde Canterbury, MD;  Location: [place];  Service: ENT;  Laterality: Bilateral;  . VULVECTOMY     Family History  Problem Relation Age of Onset  . Heart disease Mother   . Cancer Father     leukemia  . Heart disease Brother     x2  . Cancer Daughter     breast   Social History   Occupational History  . Not on file.   Social History Main Topics  . Smoking status: Never Smoker  . Smokeless tobacco: Never Used  . Alcohol use No  . Drug use: No  . Sexual activity: No    Tobacco Counseling Counseling given: Not Answered   Activities of Daily Living In your present state of health, do you have any difficulty performing the following activities: 08/23/2016 03/01/2016  Hearing? N Y  Vision? Y Y  Difficulty concentrating or making decisions? N Y  Walking or climbing stairs? Y N  Dressing or bathing? N Y  Doing errands, shopping? Tempie Donning  Preparing Food and eating ? Y -  Using the Toilet? N -  In the past six months, have you accidently leaked urine? Y -  Do you have problems with loss of bowel control? N -  Managing your Medications? Y -  Managing your Finances? Y -  Housekeeping or managing your Housekeeping? Y -  Some recent data might be hidden    Immunizations and Health Maintenance Immunization History  Administered Date(s) Administered  . Influenza Split 07/11/2014  . Influenza,inj,Quad PF,36+ Mos 07/26/2013  . Pneumococcal Polysaccharide-23 07/23/2016   Health Maintenance Due  Topic Date Due  . TETANUS/TDAP  03/20/1945  . ZOSTAVAX  03/19/1988  . DEXA SCAN  03/19/1993  . MAMMOGRAM  12/09/2014    Patient Care Team: Einar Pheasant, MD as PCP - General (Internal Medicine)  Indicate any recent Medical Services you may have received from other than [facility] providers in the past year (date may be approximate).     Assessment:   This is a routine wellness examination for [name]. The goal of the wellness visit is to assist the patient how to close the gaps in care and create a preventative  care plan for the patient.   Taking calcium VIT D as appropriate/Osteoporosis risk reviewed.  Medications reviewed; taking without issues or barriers.  Safety issues reviewed; smoke detectors in the home. No firearms in the home. Wears seatbelts when driving or riding with others. No violence in the home.  No new identified risk were noted; The patient was oriented x 3; appropriate in dress and manner and no objective failures at ADL's. Husband assists with medication administration, cooking, home management.  Uses a cane when outside of the home, uses a walker inside of the home.  Frequent falls.  Reports no recent injuries with falls.    BMI; discussed the importance of a healthy diet, water intake and exercise. Educational material provided.  Mammogram, DEXA Scan deferred for follow up with PCP.  Educational material provided.  TDAP and ZOSTAVAX vaccine postponed for follow up with insurance.  Patient Concerns: 90 day request for all medications, Omeprazole; needs 1 refill to last until upcoming appointment in January.  Head sore; front, 1 inch from hairline, 1/2 inch wide, no pain, no drainage.  She is unsure how it came about.  She picks the scab as it heals; encouraged to stop picking and use a thin layer of bacitracin/topical to help the healing process.  Encouraged to follow up with PCP if symptoms worsen and as needed. Discuss the need for B12 injection; states tired and cold most of the time.  Encouraged to keep scheduled physical.  Deferred to PCP for follow up.  Hearing/Vision screen Hearing Screening Comments: Passes the whisper test Vision Screening Comments: Followed by [facility] (Dr. Wallace Going) R eye blindness Vision loss L eye Wears glasses Semi- visits 05/2016   Dietary issues and exercise activities discussed: Current Exercise Habits: Home exercise routine (Bed/chair ROM exercises), Type of exercise: stretching, Frequency (Times/Week): 6, Intensity:  Mild  Goals    . Increase water intake      Depression Screen PHQ 2/9 Scores 08/23/2016 07/07/2015 03/03/2015 01/28/2014 07/26/2013  PHQ - 2 Score 0 0 5 1 6   PHQ- 9 Score - - 9 - 19    Fall Risk Fall Risk  08/23/2016 07/07/2015 03/03/2015 01/28/2014 07/26/2013  Falls in the past year? Yes Yes Yes Yes Yes  Number falls in past yr: 2 or more 2 or more 2 or more 1 2 or more  Injury with Fall? No No No No -  Risk Factor Category  High Fall Risk - - - -  Risk for fall due to : History of fall(s);Impaired balance/gait;Impaired vision - - - -  Follow up Education provided;Falls prevention discussed - - - -    Cognitive Function:     6CIT Screen 08/23/2016  What Year? 0 points  What month? 0 points  What time? 0 points  Count back from 20 0 points  Months in reverse 2 points    Screening Tests Health Maintenance  Topic Date Due  . TETANUS/TDAP  03/20/1945  . ZOSTAVAX  03/19/1988  . DEXA SCAN  03/19/1993  . MAMMOGRAM  12/09/2014  . INFLUENZA VACCINE  05/31/2017 (Originally 05/07/2016)  . PNA vac Low Risk Adult (2 of 2 - PCV13) 07/23/2017      Plan:    End of life planning; Advance aging; Advanced directives discussed. Copy of current HCPOA/Living Will requested.  Medicare Attestation I have personally reviewed: The patient's medical and social history Their use of alcohol, tobacco or illicit drugs Their current medications and supplements The patient's functional ability including ADLs,fall risks, home safety risks, cognitive, and hearing and visual impairment Diet and physical activities Evidence for depression   The patient's weight, height, BMI, and visual acuity have been recorded in the chart.  I have made referrals and provided education to the patient based on review of the above and I have provided the patient with a written personalized care plan for preventive services.    During the course of the visit, Lily-Rose was educated and counseled about the following  appropriate screening and preventive services:   Vaccines to include Pneumoccal, Influenza, Hepatitis B, Td, Zostavax, HCV  Electrocardiogram  Cardiovascular disease screening  Colorectal cancer screening  Bone density screening  Diabetes screening  Glaucoma screening  Mammography/PAP  Nutrition counseling  Smoking cessation counseling  Patient Instructions (the written plan) were given to the patient.    Varney Biles, LPN   [id number]    Reviewed above.  Given multiple concerns, pt needs an appt to be evaluated.  Further testing and w/up can be decided at that time.    Dr Nicki Reaper

## 2016-08-27 ENCOUNTER — Other Ambulatory Visit: Payer: Self-pay

## 2016-08-27 DIAGNOSIS — Z760 Encounter for issue of repeat prescription: Secondary | ICD-10-CM

## 2016-08-27 MED ORDER — OMEPRAZOLE 20 MG PO CPDR: 20.0000 mg | DELAYED_RELEASE_CAPSULE | Freq: Every morning | ORAL | 0 refills | Status: DC

## 2016-09-13 ENCOUNTER — Other Ambulatory Visit: Payer: Self-pay

## 2016-09-16 DIAGNOSIS — F330 Major depressive disorder, recurrent, mild: Secondary | ICD-10-CM | POA: Diagnosis not present

## 2016-09-16 DIAGNOSIS — F0280 Dementia in other diseases classified elsewhere without behavioral disturbance: Secondary | ICD-10-CM | POA: Diagnosis not present

## 2016-10-18 ENCOUNTER — Encounter: Payer: Self-pay | Admitting: Internal Medicine

## 2016-10-18 ENCOUNTER — Ambulatory Visit (INDEPENDENT_AMBULATORY_CARE_PROVIDER_SITE_OTHER): Payer: Medicare Other | Admitting: Internal Medicine

## 2016-10-18 VITALS — BP 134/68 | HR 81 | Temp 98.1°F | Ht 62.0 in | Wt 164.0 lb

## 2016-10-18 DIAGNOSIS — R739 Hyperglycemia, unspecified: Secondary | ICD-10-CM | POA: Diagnosis not present

## 2016-10-18 DIAGNOSIS — M25512 Pain in left shoulder: Secondary | ICD-10-CM

## 2016-10-18 DIAGNOSIS — E7800 Pure hypercholesterolemia, unspecified: Secondary | ICD-10-CM

## 2016-10-18 DIAGNOSIS — L989 Disorder of the skin and subcutaneous tissue, unspecified: Secondary | ICD-10-CM | POA: Diagnosis not present

## 2016-10-18 DIAGNOSIS — Z0000 Encounter for general adult medical examination without abnormal findings: Secondary | ICD-10-CM

## 2016-10-18 DIAGNOSIS — F32A Depression, unspecified: Secondary | ICD-10-CM

## 2016-10-18 DIAGNOSIS — F0390 Unspecified dementia without behavioral disturbance: Secondary | ICD-10-CM

## 2016-10-18 DIAGNOSIS — E785 Hyperlipidemia, unspecified: Secondary | ICD-10-CM

## 2016-10-18 DIAGNOSIS — R8290 Unspecified abnormal findings in urine: Secondary | ICD-10-CM | POA: Diagnosis not present

## 2016-10-18 DIAGNOSIS — I10 Essential (primary) hypertension: Secondary | ICD-10-CM

## 2016-10-18 DIAGNOSIS — F419 Anxiety disorder, unspecified: Secondary | ICD-10-CM

## 2016-10-18 DIAGNOSIS — N649 Disorder of breast, unspecified: Secondary | ICD-10-CM

## 2016-10-18 DIAGNOSIS — R748 Abnormal levels of other serum enzymes: Secondary | ICD-10-CM

## 2016-10-18 DIAGNOSIS — M25511 Pain in right shoulder: Secondary | ICD-10-CM

## 2016-10-18 DIAGNOSIS — F329 Major depressive disorder, single episode, unspecified: Secondary | ICD-10-CM

## 2016-10-18 DIAGNOSIS — H547 Unspecified visual loss: Secondary | ICD-10-CM

## 2016-10-18 DIAGNOSIS — R519 Headache, unspecified: Secondary | ICD-10-CM

## 2016-10-18 DIAGNOSIS — R51 Headache: Secondary | ICD-10-CM

## 2016-10-18 DIAGNOSIS — K219 Gastro-esophageal reflux disease without esophagitis: Secondary | ICD-10-CM

## 2016-10-18 DIAGNOSIS — L988 Other specified disorders of the skin and subcutaneous tissue: Secondary | ICD-10-CM

## 2016-10-18 LAB — LIPID PANEL
CHOL/HDL RATIO: 2
Cholesterol: 175 mg/dL (ref 0–200)
HDL: 79.8 mg/dL (ref >39.00)
LDL Cholesterol: 66 mg/dL (ref 0–99)
NonHDL: 94.77
Triglycerides: 142 mg/dL (ref 0.0–149.0)
VLDL: 28.4 mg/dL (ref 0.0–40.0)

## 2016-10-18 LAB — BASIC METABOLIC PANEL
BUN: 18 mg/dL (ref 6–23)
CHLORIDE: 107 meq/L (ref 96–112)
CO2: 27 mEq/L (ref 19–32)
Calcium: 9.9 mg/dL (ref 8.4–10.5)
Creatinine, Ser: 0.98 mg/dL (ref 0.40–1.20)
GFR: 56.85 mL/min — AB (ref >60.00)
Glucose, Bld: 103 mg/dL — ABNORMAL HIGH (ref 70–99)
Potassium: 4.5 mEq/L (ref 3.5–5.1)
SODIUM: 141 meq/L (ref 135–145)

## 2016-10-18 LAB — URINALYSIS, ROUTINE W REFLEX MICROSCOPIC
BILIRUBIN URINE: NEGATIVE
Hgb urine dipstick: NEGATIVE
Ketones, ur: NEGATIVE
Nitrite: POSITIVE — AB
PH: 5.5 (ref 5.0–8.0)
Specific Gravity, Urine: 1.02 (ref 1.000–1.030)
TOTAL PROTEIN, URINE-UPE24: NEGATIVE
URINE GLUCOSE: NEGATIVE
Urobilinogen, UA: 0.2 (ref 0.0–1.0)

## 2016-10-18 LAB — HEMOGLOBIN A1C: Hgb A1c MFr Bld: 5.8 % (ref 4.6–6.5)

## 2016-10-18 LAB — HEPATIC FUNCTION PANEL
ALK PHOS: 129 U/L — AB (ref 39–117)
ALT: 18 U/L (ref 0–35)
AST: 21 U/L (ref 0–37)
Albumin: 4.2 g/dL (ref 3.5–5.2)
BILIRUBIN DIRECT: 0.1 mg/dL (ref 0.0–0.3)
TOTAL PROTEIN: 6.8 g/dL (ref 6.0–8.3)
Total Bilirubin: 0.4 mg/dL (ref 0.2–1.2)

## 2016-10-18 MED ORDER — MUPIROCIN 2 % EX OINT: TOPICAL_OINTMENT | CUTANEOUS | 0 refills | Status: DC

## 2016-10-18 NOTE — Progress Notes (Addendum)
Patient ID: [name], female   DOB: 11-08-1925, 83 y.o.   MRN: [id number]   Subjective:    Patient ID: [name], female    DOB: Feb 05, 1926, 83 y.o.   MRN: [id number]  HPI  Patient with past history of hypercholesterolemia, macular degeneration and dementia.  She comes in to follow up on these issues as well as for a complete physical exam.  She is accompanied by her husband.  History obtained from both of them.   She is seeing neurology for dementia. She has a skin lesion on her scalp and her anterior chest. She picks at the lesion on her scalp.  Increased tenderness lesion anterior chest.  She is also still having bilateral shoulder pain.  Has seen ortho previously.  Has bone on bone.  Not sure that anything can be done.  She request referral back to Dr Latanya Maudlin to discuss any further possible options to help control pain.  Eating and drinking.  No nausea or vomiting.  Bowels stable.     Past Medical History:  Diagnosis Date  . Arthritis   . Cancer [facility])    uterine and questionable vulvar, s/p hysterectomy  . Chronic headaches   . Cystocele   . Dementia    able to sign own papers  . GERD (gastroesophageal reflux disease)   . Glaucoma   . Hypercholesterolemia   . Hypertension   . Macular degeneration   . Reactive airway disease   . Rectocele   . Right bundle branch block   . Vertigo    none - several yrs  . Wears dentures    full upper and lower   Past Surgical History:  Procedure Laterality Date  . ABDOMINAL HYSTERECTOMY    . ABDOMINAL HYSTERECTOMY    . APPENDECTOMY    . CHOLECYSTECTOMY    . IMAGE GUIDED SINUS SURGERY N/A 06/20/2015   Procedure: IMAGE GUIDED SINUS SURGERY;  Surgeon: Clyde Canterbury, MD;  Location: [place];  Service: ENT;  Laterality: N/A;  GAVE DISK TO CE CE  . REPLACEMENT TOTAL KNEE Left   . SPHENOIDECTOMY Bilateral 06/20/2015   Procedure: SPHENOIDECTOMY;  Surgeon: Clyde Canterbury, MD;  Location: [place];  Service: ENT;  Laterality:  Bilateral;  . VULVECTOMY     Family History  Problem Relation Age of Onset  . Heart disease Mother   . Cancer Father     leukemia  . Heart disease Brother     x2  . Cancer Daughter     breast   Social History   Social History  . Marital status: Married    Spouse name: N/A  . Number of children: N/A  . Years of education: N/A   Social History Main Topics  . Smoking status: Never Smoker  . Smokeless tobacco: Never Used  . Alcohol use No  . Drug use: No  . Sexual activity: No   Other Topics Concern  . None   Social History Narrative   ** Merged History Encounter **        Outpatient Encounter Prescriptions as of 10/18/2016  Medication Sig  . Aspirin-Salicylamide-Caffeine (BC HEADACHE POWDER PO) Take 1 packet by mouth daily as needed (pain/headache).  [name] atorvastatin (LIPITOR) 40 MG tablet TAKE 1 TABLET AT 6PM  . brimonidine (ALPHAGAN P) 0.1 % SOLN Place 1 drop into both eyes 2 (two) times daily.  . brinzolamide (AZOPT) 1 % ophthalmic suspension Place 1 drop into the left eye 3 (three) times daily.  [name]  fluticasone (FLONASE) 50 MCG/ACT nasal spray Place 2 sprays into both nostrils daily. (Patient taking differently: Place 2 sprays into both nostrils daily as needed (seasonal allergies). )  . hydrochlorothiazide (MICROZIDE) 12.5 MG capsule Take 1 capsule (12.5 mg total) by mouth daily.  [name] latanoprost (XALATAN) 0.005 % ophthalmic solution Place 1 drop into both eyes at bedtime.  . meclizine (ANTIVERT) 25 MG tablet Take 1 tablet (25 mg total) by mouth 3 (three) times daily as needed for dizziness.  . memantine (NAMENDA) 10 MG tablet Take 10 mg by mouth 2 (two) times daily.   . mirtazapine (REMERON) 30 MG tablet Take 30 mg by mouth at bedtime.  . Multiple Vitamin (MULTIVITAMIN WITH MINERALS) TABS tablet Take 1 tablet by mouth daily.  . Multiple Vitamins-Minerals (PRESERVISION AREDS) TABS Take 1 tablet by mouth daily.   . Omega-3 Fatty Acids (FISH OIL CONCENTRATE PO) Take 1  capsule by mouth daily.   [name] omeprazole (PRILOSEC) 20 MG capsule Take 1 capsule (20 mg total) by mouth every morning.  [name] oxybutynin (OXYTROL) 3.9 MG/24HR Place 1 patch onto the skin every 3 (three) days.  . rosuvastatin (CRESTOR) 20 MG tablet Take 1 tablet (20 mg total) by mouth at bedtime.  . sertraline (ZOLOFT) 100 MG tablet Take 150 mg by mouth daily.   . SERTRALINE HCL PO Take 100 mg by mouth daily.  . travoprost, benzalkonium, (TRAVATAN) 0.004 % ophthalmic solution Place 1 drop into both eyes at bedtime.  . vitamin D, CHOLECALCIFEROL, 400 UNITS tablet Take 400 Units by mouth daily.  . mupirocin ointment (BACTROBAN) 2 % Apply to affected area on chest and scalp bid x 1 week.   No facility-administered encounter medications on file as of 10/18/2016.     Review of Systems  Constitutional: Negative for appetite change and unexpected weight change.  HENT: Negative for congestion and sinus pressure.   Eyes: Negative for pain and visual disturbance.  Respiratory: Negative for cough, chest tightness and shortness of breath.   Cardiovascular: Negative for chest pain, palpitations and leg swelling.  Gastrointestinal: Negative for abdominal pain, diarrhea, nausea and vomiting.  Genitourinary: Negative for difficulty urinating and dysuria.  Musculoskeletal: Negative for joint swelling.       Bilateral shoulder pain as outlined.    Skin: Negative for color change and rash.  Neurological: Negative for dizziness and headaches.  Hematological: Negative for adenopathy. Does not bruise/bleed easily.  Psychiatric/Behavioral: Negative for agitation and dysphoric mood.       Objective:     Blood pressure rechecked by me:  134/68  Physical Exam  Constitutional: She is oriented to person, place, and time. She appears well-developed and well-nourished. No distress.  HENT:  Nose: Nose normal.  Mouth/Throat: Oropharynx is clear and moist.  Eyes: Right eye exhibits no discharge. Left eye exhibits no  discharge. No scleral icterus.  Neck: Neck supple. No thyromegaly present.  Cardiovascular: Normal rate and regular rhythm.   Pulmonary/Chest: Breath sounds normal. No accessory muscle usage. No tachypnea. No respiratory distress. She has no decreased breath sounds. She has no wheezes. She has no rhonchi. Right breast exhibits no inverted nipple, no mass, no nipple discharge and no tenderness (no axillary adenopathy). Left breast exhibits no inverted nipple, no mass, no nipple discharge and no tenderness (no axilarry adenopathy).  Abdominal: Soft. Bowel sounds are normal. There is no tenderness.  Musculoskeletal: She exhibits no edema or tenderness.  Lymphadenopathy:    She has no cervical adenopathy.  Neurological: She is alert and  oriented to person, place, and time.  Skin: Skin is warm. No rash noted. No erythema.  Lesion anterior chest - minimal tenderness.  Scalp lesion - some oozing.    Psychiatric: She has a normal mood and affect. Her behavior is normal.    BP 134/68   Pulse 81   Temp 98.1 F (36.7 C) (Oral)   Ht '5\' 2"'$  (1.575 m)   Wt 164 lb (74.4 kg)   SpO2 94%   BMI 30.00 kg/m  Wt Readings from Last 3 Encounters:  10/18/16 164 lb (74.4 kg)  08/23/16 156 lb 12.8 oz (71.1 kg)  05/31/16 151 lb 8 oz (68.7 kg)     Lab Results  Component Value Date   WBC 7.4 05/31/2016   HGB 13.4 05/31/2016   HCT 40.0 05/31/2016   PLT 270.0 05/31/2016   GLUCOSE 103 (H) 10/18/2016   CHOL 175 10/18/2016   TRIG 142.0 10/18/2016   HDL 79.80 10/18/2016   LDLCALC 66 10/18/2016   ALT 18 10/18/2016   AST 21 10/18/2016   NA 141 10/18/2016   K 4.5 10/18/2016   CL 107 10/18/2016   CREATININE 0.98 10/18/2016   BUN 18 10/18/2016   CO2 27 10/18/2016   TSH 1.37 05/31/2016   INR 1.07 01/15/2016   HGBA1C 5.8 10/18/2016   MICROALBUR 1.0 03/01/2015    Dg Chest 2 View  Result Date: 03/01/2016 CLINICAL DATA:  Recent stroke-like episode EXAM: CHEST  2 VIEW COMPARISON:  None. FINDINGS: The heart  size and mediastinal contours are within normal limits. Both lungs are clear. The visualized skeletal structures are unremarkable. IMPRESSION: No active cardiopulmonary disease. Electronically Signed   By: Inez Catalina M.D.   On: 03/01/2016 21:59   Ct Head Wo Contrast  Result Date: 03/01/2016 CLINICAL DATA:  Headache 2 weeks with dizziness today. EXAM: CT HEAD WITHOUT CONTRAST TECHNIQUE: Contiguous axial images were obtained from the base of the skull through the vertex without intravenous contrast. COMPARISON:  None. FINDINGS: Ventricles, cisterns and other CSF spaces are within normal. There is mild chronic ischemic microvascular disease. There is no mass, mass effect, shift of midline structures or acute hemorrhage. No evidence of acute infarction. Remaining bones and soft tissues are within normal. IMPRESSION: No acute intracranial findings. Mild chronic ischemic microvascular disease. Electronically Signed   By: Marin Olp M.D.   On: 03/01/2016 15:19   Mr Brain Wo Contrast  Result Date: 03/02/2016 CLINICAL DATA:  Vertigo EXAM: MRI HEAD WITHOUT CONTRAST MRA HEAD WITHOUT CONTRAST TECHNIQUE: Multiplanar, multiecho pulse sequences of the brain and surrounding structures were obtained without intravenous contrast. Angiographic images of the head were obtained using MRA technique without contrast. COMPARISON:  CT head 03/01/2016 FINDINGS: MRI HEAD FINDINGS Generalized atrophy. Ventricular enlargement consistent with atrophy. Negative for acute infarct. Mild chronic white matter changes bilaterally. Mild chronic changes in the basilar. These are most consistent with chronic microvascular ischemia. Negative for intracranial hemorrhage or fluid collection Negative for mass or edema. Mild mucosal edema paranasal sinuses. Bilateral lens replacement. No orbital mass. Image quality degraded by motion. MRA HEAD FINDINGS Image quality degraded by motion. Both vertebral arteries patent to the basilar. Fetal origin  right posterior cerebral artery. Mild stenosis mid right posterior cerebral artery. Left posterior cerebral artery patent with mild stenosis distally. Superior cerebellar arteries patent bilaterally. PICA patent bilaterally. Irregularity of the cavernous carotid artery bilaterally with decreased signal bilaterally. This could be due to stenosis or artifact due to tortuosity. Anterior and middle cerebral arteries patent  bilaterally without significant stenosis. Hypoplastic right A1 segment. Both anterior cerebral arteries supplied from the left. Negative for vascular malformation or aneurysm. IMPRESSION: Image quality degraded by motion Negative for acute infarct Atrophy with mild chronic microvascular ischemia Bilateral posterior cerebral artery stenoses. Probable stenosis of the cavernous carotid bilaterally, difficult to quantitate due to tortuosity and motion. Electronically Signed   By: Franchot Gallo M.D.   On: 03/02/2016 11:02   Mr Jodene Nam Head/brain YN Cm  Result Date: 03/02/2016 CLINICAL DATA:  Vertigo EXAM: MRI HEAD WITHOUT CONTRAST MRA HEAD WITHOUT CONTRAST TECHNIQUE: Multiplanar, multiecho pulse sequences of the brain and surrounding structures were obtained without intravenous contrast. Angiographic images of the head were obtained using MRA technique without contrast. COMPARISON:  CT head 03/01/2016 FINDINGS: MRI HEAD FINDINGS Generalized atrophy. Ventricular enlargement consistent with atrophy. Negative for acute infarct. Mild chronic white matter changes bilaterally. Mild chronic changes in the basilar. These are most consistent with chronic microvascular ischemia. Negative for intracranial hemorrhage or fluid collection Negative for mass or edema. Mild mucosal edema paranasal sinuses. Bilateral lens replacement. No orbital mass. Image quality degraded by motion. MRA HEAD FINDINGS Image quality degraded by motion. Both vertebral arteries patent to the basilar. Fetal origin right posterior cerebral  artery. Mild stenosis mid right posterior cerebral artery. Left posterior cerebral artery patent with mild stenosis distally. Superior cerebellar arteries patent bilaterally. PICA patent bilaterally. Irregularity of the cavernous carotid artery bilaterally with decreased signal bilaterally. This could be due to stenosis or artifact due to tortuosity. Anterior and middle cerebral arteries patent bilaterally without significant stenosis. Hypoplastic right A1 segment. Both anterior cerebral arteries supplied from the left. Negative for vascular malformation or aneurysm. IMPRESSION: Image quality degraded by motion Negative for acute infarct Atrophy with mild chronic microvascular ischemia Bilateral posterior cerebral artery stenoses. Probable stenosis of the cavernous carotid bilaterally, difficult to quantitate due to tortuosity and motion. Electronically Signed   By: Franchot Gallo M.D.   On: 03/02/2016 11:02       Assessment & Plan:   Problem List Items Addressed This Visit    Anxiety    Followed by Dr Nicolasa Ducking.  On zoloft and remeron.       Relevant Medications   mirtazapine (REMERON) 30 MG tablet   Dementia    Followed by neurology.       Relevant Medications   mirtazapine (REMERON) 30 MG tablet   Depression    Seeing Dr Nicolasa Ducking.  Taking zoloft and remeron.        Relevant Medications   mirtazapine (REMERON) 30 MG tablet   Elevated alkaline phosphatase level    Follow liver panel.       Essential hypertension, benign    Blood pressure on recheck improved.  Follow pressures.        GERD (gastroesophageal reflux disease)    Symptoms controlled on omeprazole.        Headache    Has seen neurology.  Stable.        Relevant Medications   mirtazapine (REMERON) 30 MG tablet   Health care maintenance    Physical today 10/18/16.  Discussed mammogram.  She declines.        Hypercholesterolemia    On crestor.  Low cholesterol diet and exercise.  Follow lipid panel and liver function  tests.        Relevant Orders   Hepatic function panel (Completed)   Lipid panel (Completed)   Hyperglycemia    Low carb diet and exercise.  Follow  met b and a1c.        Relevant Orders   Hemoglobin A1c (Completed)   Basic metabolic panel (Completed)   Hyperlipidemia    Low cholesterol diet and exercise.  Follow lipid panel and liver function tests.  On crestor.       Loss of vision    Followed by opthalmology.  Source of increased stress.  Follow.       Shoulder pain    Bilateral shoulder pain.  Increased.  Has documented bone on bone per report.  She requested referral back to Dr Latanya Maudlin to discuss.        Relevant Orders   Ambulatory referral to Orthopedic Surgery   Skin lesion of breast    Persistent lesion.  Refer to dermatology.         Other Visit Diagnoses    Skin lesion    -  Primary   Has scalp and anterior chest lesion.  bactroban ointment topically.  refer to dermatology.    Relevant Orders   Ambulatory referral to Dermatology   Bad odor of urine       Relevant Orders   Urinalysis, Routine w reflex microscopic (Completed)   CULTURE, URINE COMPREHENSIVE       Einar Pheasant, MD

## 2016-10-18 NOTE — Progress Notes (Signed)
Pre visit review using our clinic review tool, if applicable. No additional management support is needed unless otherwise documented below in the visit note. 

## 2016-10-20 NOTE — Assessment & Plan Note (Signed)
Has seen neurology.  Stable.  

## 2016-10-20 NOTE — Assessment & Plan Note (Signed)
Low cholesterol diet and exercise.  Follow lipid panel and liver function tests.  On crestor.   

## 2016-10-20 NOTE — Assessment & Plan Note (Signed)
Followed by neurology.   

## 2016-10-20 NOTE — Assessment & Plan Note (Signed)
Seeing Dr Nicolasa Ducking.  Taking zoloft and remeron.

## 2016-10-20 NOTE — Assessment & Plan Note (Signed)
On crestor.  Low cholesterol diet and exercise.  Follow lipid panel and liver function tests.   

## 2016-10-20 NOTE — Assessment & Plan Note (Signed)
Followed by Dr Nicolasa Ducking.  On zoloft and remeron.

## 2016-10-20 NOTE — Assessment & Plan Note (Signed)
Persistent lesion.  Refer to dermatology.  

## 2016-10-20 NOTE — Addendum Note (Signed)
Addended by: Alisa Graff on: 10/20/2016 11:36 AM   Modules accepted: Orders

## 2016-10-20 NOTE — Assessment & Plan Note (Signed)
Symptoms controlled on omeprazole.   

## 2016-10-20 NOTE — Assessment & Plan Note (Signed)
Low carb diet and exercise.  Follow met b and a1c.   

## 2016-10-20 NOTE — Assessment & Plan Note (Signed)
Bilateral shoulder pain.  Increased.  Has documented bone on bone per report.  She requested referral back to Dr Latanya Maudlin to discuss.

## 2016-10-20 NOTE — Assessment & Plan Note (Signed)
Blood pressure on recheck improved.  Follow pressures.   

## 2016-10-20 NOTE — Assessment & Plan Note (Signed)
Follow liver panel.  

## 2016-10-20 NOTE — Assessment & Plan Note (Signed)
Followed by opthalmology.  Source of increased stress.  Follow.

## 2016-10-20 NOTE — Assessment & Plan Note (Signed)
Physical today 10/18/16.  Discussed mammogram.  She declines.

## 2016-10-21 ENCOUNTER — Other Ambulatory Visit: Payer: Self-pay | Admitting: Internal Medicine

## 2016-10-21 DIAGNOSIS — R748 Abnormal levels of other serum enzymes: Secondary | ICD-10-CM

## 2016-10-21 LAB — CULTURE, URINE COMPREHENSIVE

## 2016-10-21 NOTE — Progress Notes (Signed)
Order placed for f/u alkaline phos 

## 2016-10-22 ENCOUNTER — Other Ambulatory Visit: Payer: Self-pay | Admitting: Internal Medicine

## 2016-10-22 MED ORDER — AMOXICILLIN-POT CLAVULANATE 500-125 MG PO TABS: 1.0000 | ORAL_TABLET | Freq: Two times a day (BID) | ORAL | 0 refills | Status: DC

## 2016-10-22 NOTE — Progress Notes (Signed)
rx sent in for augmentin #10 with no refills.

## 2016-11-08 DIAGNOSIS — D485 Neoplasm of uncertain behavior of skin: Secondary | ICD-10-CM | POA: Diagnosis not present

## 2016-11-08 DIAGNOSIS — L910 Hypertrophic scar: Secondary | ICD-10-CM | POA: Diagnosis not present

## 2016-11-08 DIAGNOSIS — C4441 Basal cell carcinoma of skin of scalp and neck: Secondary | ICD-10-CM | POA: Diagnosis not present

## 2016-11-11 ENCOUNTER — Other Ambulatory Visit: Payer: Self-pay | Admitting: Internal Medicine

## 2016-11-11 ENCOUNTER — Other Ambulatory Visit (INDEPENDENT_AMBULATORY_CARE_PROVIDER_SITE_OTHER): Payer: Medicare Other

## 2016-11-11 ENCOUNTER — Telehealth: Payer: Self-pay | Admitting: *Deleted

## 2016-11-11 ENCOUNTER — Other Ambulatory Visit: Payer: Medicare Other

## 2016-11-11 DIAGNOSIS — R748 Abnormal levels of other serum enzymes: Secondary | ICD-10-CM | POA: Diagnosis not present

## 2016-11-11 DIAGNOSIS — R8290 Unspecified abnormal findings in urine: Secondary | ICD-10-CM

## 2016-11-11 LAB — ALKALINE PHOSPHATASE: ALK PHOS: 116 U/L (ref 39–117)

## 2016-11-11 NOTE — Addendum Note (Signed)
Addended by: Leeanne Rio on: 11/11/2016 11:45 AM   Modules accepted: Orders

## 2016-11-11 NOTE — Progress Notes (Signed)
Orders placed for urinalysis and culture.

## 2016-11-11 NOTE — Telephone Encounter (Signed)
Advised patients daughter has appointment today

## 2016-11-11 NOTE — Telephone Encounter (Signed)
Pt's daughter stated that pt was given a Rx for a medication for a UTI, Pt currently continues to have symptoms of a odd smell and burning  while urination.  [facility] [phone]

## 2016-11-12 DIAGNOSIS — F0280 Dementia in other diseases classified elsewhere without behavioral disturbance: Secondary | ICD-10-CM | POA: Diagnosis not present

## 2016-11-12 DIAGNOSIS — F330 Major depressive disorder, recurrent, mild: Secondary | ICD-10-CM | POA: Diagnosis not present

## 2016-11-12 LAB — URINALYSIS, ROUTINE W REFLEX MICROSCOPIC
BILIRUBIN URINE: NEGATIVE
Hgb urine dipstick: NEGATIVE
KETONES UR: NEGATIVE
LEUKOCYTES UA: NEGATIVE
NITRITE: NEGATIVE
RBC / HPF: NONE SEEN (ref 0–?)
SPECIFIC GRAVITY, URINE: 1.01 (ref 1.000–1.030)
Total Protein, Urine: NEGATIVE
URINE GLUCOSE: NEGATIVE
UROBILINOGEN UA: 0.2 (ref 0.0–1.0)
pH: 5.5 (ref 5.0–8.0)

## 2016-11-13 ENCOUNTER — Telehealth: Payer: Self-pay | Admitting: *Deleted

## 2016-11-13 LAB — CULTURE, URINE COMPREHENSIVE

## 2016-11-13 MED ORDER — AMOXICILLIN-POT CLAVULANATE 500-125 MG PO TABS: 1.0000 | ORAL_TABLET | Freq: Two times a day (BID) | ORAL | 0 refills | Status: DC

## 2016-11-13 NOTE — Telephone Encounter (Signed)
Rx sent to [facility] notified od results & recommendations.

## 2016-11-13 NOTE — Telephone Encounter (Signed)
-----   Message from Einar Pheasant, MD sent at 11/13/2016  3:21 PM EST ----- Notify pt that her urine culture was positive for infection.  Confirm no antibiotic allergies.  If no, then start augentin 500mg  bid x 1 week.  Needs to take probiotic daily while on abx and for two weeks after completing abx.   (ex align )

## 2016-12-02 ENCOUNTER — Telehealth: Payer: Self-pay | Admitting: Internal Medicine

## 2016-12-02 NOTE — Telephone Encounter (Signed)
If persistent problems, will need to be evaluated.

## 2016-12-02 NOTE — Telephone Encounter (Signed)
Appointment scheduled with Mable Paris, FNP to be evaluated.

## 2016-12-02 NOTE — Telephone Encounter (Signed)
Spoke with husband patient is having dysuria, urinary frequency , finished amoxicillin .  Last office visit 11/13/16  Please advise.

## 2016-12-02 NOTE — Telephone Encounter (Signed)
Pt spouse called and stated that the pt is c/o burning when urinating. She was given antibiotics last week. Please advise, thank you!  Call pt @ [phone]

## 2016-12-03 ENCOUNTER — Encounter: Payer: Self-pay | Admitting: Family

## 2016-12-03 ENCOUNTER — Ambulatory Visit (INDEPENDENT_AMBULATORY_CARE_PROVIDER_SITE_OTHER): Payer: Medicare Other | Admitting: Family

## 2016-12-03 VITALS — BP 142/64 | HR 85 | Temp 98.1°F | Ht 62.0 in | Wt 167.8 lb

## 2016-12-03 DIAGNOSIS — R300 Dysuria: Secondary | ICD-10-CM

## 2016-12-03 LAB — POCT URINALYSIS DIPSTICK
BILIRUBIN UA: NEGATIVE
Glucose, UA: NEGATIVE
Nitrite, UA: NEGATIVE
PH UA: 5
RBC UA: NEGATIVE
Spec Grav, UA: 1.02
Urobilinogen, UA: 0.2

## 2016-12-03 NOTE — Progress Notes (Signed)
Subjective:    Patient ID: [name], female    DOB: [birth year], 83 y.o.   MRN: [id number]  CC: [name] is a 83 y.o. female who presents today for an acute visit.    HPI: CC: dysuria x 4 days ago, 'little bit worse.' No fever, flank pain.  Accompanied by husband. Has dementia.Wears depends.  Urinates twice per night; this has been going on for a long time.   Wears oxytrol patch which helps with bladder incontinence. Per husband, doesn't have 'good hygiene.   Never been hospitalized for UTI.   Frequent  UTI, last ecoli 3 weeks ago and treated with augmentin 7 day course       HISTORY:  Past Medical History:  Diagnosis Date  . Arthritis   . Cancer [facility])    uterine and questionable vulvar, s/p hysterectomy  . Chronic headaches   . Cystocele   . Dementia    able to sign own papers  . GERD (gastroesophageal reflux disease)   . Glaucoma   . Hypercholesterolemia   . Hypertension   . Macular degeneration   . Reactive airway disease   . Rectocele   . Right bundle branch block   . Vertigo    none - several yrs  . Wears dentures    full upper and lower   Past Surgical History:  Procedure Laterality Date  . ABDOMINAL HYSTERECTOMY    . ABDOMINAL HYSTERECTOMY    . APPENDECTOMY    . CHOLECYSTECTOMY    . IMAGE GUIDED SINUS SURGERY N/A 06/20/2015   Procedure: IMAGE GUIDED SINUS SURGERY;  Surgeon: Clyde Canterbury, MD;  Location: [place];  Service: ENT;  Laterality: N/A;  GAVE DISK TO CE CE  . REPLACEMENT TOTAL KNEE Left   . SPHENOIDECTOMY Bilateral 06/20/2015   Procedure: SPHENOIDECTOMY;  Surgeon: Clyde Canterbury, MD;  Location: [place];  Service: ENT;  Laterality: Bilateral;  . VULVECTOMY     Family History  Problem Relation Age of Onset  . Heart disease Mother   . Cancer Father     leukemia  . Heart disease Brother     x2  . Cancer Daughter     breast    Allergies: Ambien [zolpidem tartrate] and Codeine Current Outpatient Prescriptions  on File Prior to Visit  Medication Sig Dispense Refill  . amoxicillin-clavulanate (AUGMENTIN) 500-125 MG tablet Take 1 tablet (500 mg total) by mouth 2 (two) times daily. 14 tablet 0  . Aspirin-Salicylamide-Caffeine (BC HEADACHE POWDER PO) Take 1 packet by mouth daily as needed (pain/headache).    [name] atorvastatin (LIPITOR) 40 MG tablet TAKE 1 TABLET AT 6PM 90 tablet 3  . brimonidine (ALPHAGAN P) 0.1 % SOLN Place 1 drop into both eyes 2 (two) times daily.    . brinzolamide (AZOPT) 1 % ophthalmic suspension Place 1 drop into the left eye 3 (three) times daily. 10 mL 12  . fluticasone (FLONASE) 50 MCG/ACT nasal spray Place 2 sprays into both nostrils daily. (Patient taking differently: Place 2 sprays into both nostrils daily as needed (seasonal allergies). ) 16 g 1  . hydrochlorothiazide (MICROZIDE) 12.5 MG capsule Take 1 capsule (12.5 mg total) by mouth daily. 30 capsule 3  . latanoprost (XALATAN) 0.005 % ophthalmic solution Place 1 drop into both eyes at bedtime. 2.5 mL 12  . meclizine (ANTIVERT) 25 MG tablet Take 1 tablet (25 mg total) by mouth 3 (three) times daily as needed for dizziness. 90 tablet 3  . memantine (NAMENDA)  10 MG tablet Take 10 mg by mouth 2 (two) times daily.     . mirtazapine (REMERON) 30 MG tablet Take 30 mg by mouth at bedtime.    . Multiple Vitamin (MULTIVITAMIN WITH MINERALS) TABS tablet Take 1 tablet by mouth daily.    . Multiple Vitamins-Minerals (PRESERVISION AREDS) TABS Take 1 tablet by mouth daily.     . mupirocin ointment (BACTROBAN) 2 % Apply to affected area on chest and scalp bid x 1 week. 22 g 0  . Omega-3 Fatty Acids (FISH OIL CONCENTRATE PO) Take 1 capsule by mouth daily.     [name] omeprazole (PRILOSEC) 20 MG capsule Take 1 capsule (20 mg total) by mouth every morning. 90 capsule 0  . oxybutynin (OXYTROL) 3.9 MG/24HR Place 1 patch onto the skin every 3 (three) days. 8 patch 0  . rosuvastatin (CRESTOR) 20 MG tablet Take 1 tablet (20 mg total) by mouth at bedtime. 30  tablet 3  . sertraline (ZOLOFT) 100 MG tablet Take 150 mg by mouth daily.     . SERTRALINE HCL PO Take 100 mg by mouth daily.    . travoprost, benzalkonium, (TRAVATAN) 0.004 % ophthalmic solution Place 1 drop into both eyes at bedtime.    . vitamin D, CHOLECALCIFEROL, 400 UNITS tablet Take 400 Units by mouth daily.     No current facility-administered medications on file prior to visit.     Social History  Substance Use Topics  . Smoking status: Never Smoker  . Smokeless tobacco: Never Used  . Alcohol use No    Review of Systems  Constitutional: Negative for chills and fever.  Respiratory: Negative for cough.   Cardiovascular: Negative for chest pain and palpitations.  Gastrointestinal: Negative for abdominal pain, nausea and vomiting.  Genitourinary: Positive for dysuria and frequency. Negative for flank pain.      Objective:    BP (!) 142/64   Pulse 85   Temp 98.1 F (36.7 C) (Oral)   Ht 5\' 2"  (1.575 m)   Wt 167 lb 12.8 oz (76.1 kg)   SpO2 96%   BMI 30.69 kg/m    Physical Exam  Constitutional: She appears well-developed and well-nourished.  Cardiovascular: Normal rate, regular rhythm, normal heart sounds and normal pulses.   Pulmonary/Chest: Effort normal and breath sounds normal. She has no wheezes. She has no rhonchi. She has no rales.  Abdominal: There is no CVA tenderness.  Neurological: She is alert.  Skin: Skin is warm and dry.  Psychiatric: She has a normal mood and affect. Her speech is normal and behavior is normal. Thought content normal.  Vitals reviewed.      Assessment & Plan:   1. Dysuria Trace leukocytes. Patient, caregiver, and I jointly agreed to wait on urine culture. We also agreed that due to recurrent nature of UTI, appropriate next step is referral to urology. Also considering OAB and poor hygiene ( poor husband) as contributory.   - POCT Urinalysis Dipstick - Urine Culture - Ambulatory referral to Urology    I am having Ms. Ladell Pier  maintain her travoprost (benzalkonium), PRESERVISION AREDS, Omega-3 Fatty Acids (FISH OIL CONCENTRATE PO), sertraline, vitamin D (CHOLECALCIFEROL), brinzolamide, latanoprost, memantine, fluticasone, multivitamin with minerals, brimonidine, Aspirin-Salicylamide-Caffeine (BC HEADACHE POWDER PO), SERTRALINE HCL PO, meclizine, oxybutynin, rosuvastatin, hydrochlorothiazide, atorvastatin, omeprazole, mirtazapine, mupirocin ointment, and amoxicillin-clavulanate.   No orders of the defined types were placed in this encounter.   Return precautions given.   Risks, benefits, and alternatives of the medications and treatment plan prescribed  today were discussed, and patient expressed understanding.   Education regarding symptom management and diagnosis given to patient on AVS.  Continue to follow with Einar Pheasant, MD for routine health maintenance.   Areatha Keas and I agreed with plan.   Mable Paris, FNP

## 2016-12-03 NOTE — Progress Notes (Signed)
Pre visit review using our clinic review tool, if applicable. No additional management support is needed unless otherwise documented below in the visit note. 

## 2016-12-03 NOTE — Patient Instructions (Signed)
May start azo over the counter for burning with urination  Will wait on culture prior to treating  Plenty of fluids  Referral placed to urology for consult due to frequent nature of UTIs.   If there is no improvement in your symptoms, or if there is any worsening of symptoms, or if you have any additional concerns, please return for re-evaluation; or, if we are closed, consider going to the Emergency Room for evaluation if symptoms urgent.

## 2016-12-05 ENCOUNTER — Telehealth: Payer: Self-pay | Admitting: Internal Medicine

## 2016-12-05 NOTE — Telephone Encounter (Signed)
Pt daughter called and stated that pt's bp has been running high around 150 160's over 80's. Please advise, thank you!  Call daughter Butch Penny @ [phone]

## 2016-12-06 LAB — URINE CULTURE

## 2016-12-06 NOTE — Telephone Encounter (Signed)
Per Dr Nicki Reaper work in 12/12/16 @ 12:00pm patient aware of appt Butch Penny daughter [phone] cell  Thanks

## 2016-12-06 NOTE — Telephone Encounter (Addendum)
Butch Penny daughter calls [phone] with  BP readings  160/83 , 166/85 p 74 2/28 151/83 p76 3/1 8;00am 158/87 p 72 3:00 pm 152/70 p 67 3/2 am  Having a hard time trying to keep herself clean due to almost blind, and shoulder problems difficulty wiping.    Is there any in home health services that can help out.   Please advise.

## 2016-12-06 NOTE — Telephone Encounter (Signed)
Left message to call for [name]

## 2016-12-06 NOTE — Telephone Encounter (Signed)
Blood pressures are higher than I would like to see them stay.  Continue to spot check her pressures.  Needs f/u appt to discuss treatment.  I can order home health if they desire.

## 2016-12-07 ENCOUNTER — Telehealth: Payer: Self-pay | Admitting: Family

## 2016-12-07 DIAGNOSIS — N3000 Acute cystitis without hematuria: Secondary | ICD-10-CM

## 2016-12-07 MED ORDER — CEFDINIR 300 MG PO CAPS: 300.0000 mg | ORAL_CAPSULE | Freq: Two times a day (BID) | ORAL | 0 refills | Status: AC

## 2016-12-07 NOTE — Telephone Encounter (Signed)
Spoke with pt  Aware of UTI and abx  Crcl 48; so didn't do macrobid.   last treated recently with augmentin, decided to use more broad spectrum, omnicef  Already has an appt with urology per pt

## 2016-12-10 ENCOUNTER — Ambulatory Visit (INDEPENDENT_AMBULATORY_CARE_PROVIDER_SITE_OTHER): Payer: Medicare Other | Admitting: Urology

## 2016-12-10 ENCOUNTER — Encounter: Payer: Self-pay | Admitting: Urology

## 2016-12-10 VITALS — BP 152/81 | HR 80 | Ht 62.5 in | Wt 165.4 lb

## 2016-12-10 DIAGNOSIS — N952 Postmenopausal atrophic vaginitis: Secondary | ICD-10-CM | POA: Diagnosis not present

## 2016-12-10 DIAGNOSIS — N390 Urinary tract infection, site not specified: Secondary | ICD-10-CM

## 2016-12-10 LAB — URINALYSIS, COMPLETE
Bilirubin, UA: NEGATIVE
GLUCOSE, UA: NEGATIVE
Ketones, UA: NEGATIVE
Nitrite, UA: NEGATIVE
PROTEIN UA: NEGATIVE
RBC, UA: NEGATIVE
Specific Gravity, UA: 1.015 (ref 1.005–1.030)
Urobilinogen, Ur: 0.2 mg/dL (ref 0.2–1.0)
pH, UA: 5 (ref 5.0–7.5)

## 2016-12-10 LAB — MICROSCOPIC EXAMINATION: Epithelial Cells (non renal): >10 /hpf — AB (ref 0–10)

## 2016-12-10 LAB — BLADDER SCAN AMB NON-IMAGING: Scan Result: 24

## 2016-12-10 MED ORDER — NITROFURANTOIN MONOHYD MACRO 100 MG PO CAPS: 100.0000 mg | ORAL_CAPSULE | Freq: Two times a day (BID) | ORAL | 0 refills | Status: DC

## 2016-12-10 NOTE — Progress Notes (Signed)
12/10/2016 2:28 PM   [name] Sep 18, 1926 [id number]  Referring provider: Einar Pheasant, MD [address] [place], [place] [address]  Chief Complaint  Patient presents with  . New Patient (Initial Visit)    Dysuria    HPI: Patient is a 83 -year-old Caucasian female who is referred to [place] by, Dr. Einar Pheasant, for recurrent urinary tract infections and dysuria.    Patient states that she has had three urinary tract infections over the last year.  Her symptoms with a urinary tract infection consist of frequency, urethral burning and stinging.  She denies gross hematuria, suprapubic pain, back pain, abdominal pain or flank pain.  She has not had any recent fevers, chills, nausea or vomiting.   She does not have a history of nephrolithiasis, GU surgery or GU trauma.   Reviewing her records,  she has had three documented positive urine culture for E. Coli and Enterococcus.    She is not sexually active.  She is post menopausal.  She denies constipation and/or diarrhea.   She does not engage in good perineal hygiene. She does not take tub baths.   She has incontinence.  She is using incontinence pads x 2-3 a day.  She oxytrol patch.    She is not having pain with bladder filling.    She has not had any recent imaging studies.    She is drinking 2 to 3 glasses of water daily.  She has one to two cups of coffee in the morning.  She does not drink alcohol.   She is not taking probiotic, cranberry juice/tablets or Vitamin C.   Her UA today was unremarkable.  Her PVR was 24 mL.     PMH: Past Medical History:  Diagnosis Date  . Anxiety   . Arthritis   . Cancer [facility])    uterine and questionable vulvar, s/p hysterectomy  . Chronic headaches   . Cystocele   . Dementia    able to sign own papers  . Depression   . GERD (gastroesophageal reflux disease)   . Glaucoma   . Hypercholesterolemia   . Hypertension   . Macular degeneration   . Reactive airway  disease   . Rectocele   . Right bundle branch block   . Sleep apnea   . Uterine cancer ([place])   . Vertigo    none - several yrs  . Wears dentures    full upper and lower    Surgical History: Past Surgical History:  Procedure Laterality Date  . ABDOMINAL HYSTERECTOMY    . ABDOMINAL HYSTERECTOMY    . APPENDECTOMY    . CHOLECYSTECTOMY    . IMAGE GUIDED SINUS SURGERY N/A 06/20/2015   Procedure: IMAGE GUIDED SINUS SURGERY;  Surgeon: Clyde Canterbury, MD;  Location: [place];  Service: ENT;  Laterality: N/A;  GAVE DISK TO CE CE  . REPLACEMENT TOTAL KNEE Left   . SPHENOIDECTOMY Bilateral 06/20/2015   Procedure: SPHENOIDECTOMY;  Surgeon: Clyde Canterbury, MD;  Location: [place];  Service: ENT;  Laterality: Bilateral;  . uterine cancer    . VULVECTOMY      Home Medications:  Allergies as of 12/10/2016      Reactions   Ambien [zolpidem Tartrate] Other (See Comments)   "hallucination"   Codeine Itching      Medication List       Accurate as of 12/10/16  2:28 PM. Always use your most recent med list.  amoxicillin-clavulanate 500-125 MG tablet Commonly known as:  AUGMENTIN Take 1 tablet (500 mg total) by mouth 2 (two) times daily.   atorvastatin 40 MG tablet Commonly known as:  LIPITOR TAKE 1 TABLET AT 6PM   BC HEADACHE POWDER PO Take 1 packet by mouth daily as needed (pain/headache).   brimonidine 0.1 % Soln Commonly known as:  ALPHAGAN P Place 1 drop into both eyes 2 (two) times daily.   brinzolamide 1 % ophthalmic suspension Commonly known as:  AZOPT Place 1 drop into the left eye 3 (three) times daily.   cefdinir 300 MG capsule Commonly known as:  OMNICEF Take 1 capsule (300 mg total) by mouth 2 (two) times daily.   FISH OIL CONCENTRATE PO Take 1 capsule by mouth daily.   fluticasone 50 MCG/ACT nasal spray Commonly known as:  FLONASE Place 2 sprays into both nostrils daily.   hydrochlorothiazide 12.5 MG capsule Commonly known as:   MICROZIDE Take 1 capsule (12.5 mg total) by mouth daily.   latanoprost 0.005 % ophthalmic solution Commonly known as:  XALATAN Place 1 drop into both eyes at bedtime.   meclizine 25 MG tablet Commonly known as:  ANTIVERT Take 1 tablet (25 mg total) by mouth 3 (three) times daily as needed for dizziness.   memantine 10 MG tablet Commonly known as:  NAMENDA Take 10 mg by mouth 2 (two) times daily.   mirtazapine 30 MG tablet Commonly known as:  REMERON Take 30 mg by mouth at bedtime.   multivitamin with minerals Tabs tablet Take 1 tablet by mouth daily.   mupirocin ointment 2 % Commonly known as:  BACTROBAN Apply to affected area on chest and scalp bid x 1 week.   omeprazole 20 MG capsule Commonly known as:  PRILOSEC Take 1 capsule (20 mg total) by mouth every morning.   oxybutynin 3.9 MG/24HR Commonly known as:  OXYTROL Place 1 patch onto the skin every 3 (three) days.   PRESERVISION AREDS Tabs Take 1 tablet by mouth daily.   rosuvastatin 20 MG tablet Commonly known as:  CRESTOR Take 1 tablet (20 mg total) by mouth at bedtime.   SERTRALINE HCL PO Take 100 mg by mouth daily.   sertraline 100 MG tablet Commonly known as:  ZOLOFT Take 150 mg by mouth daily.   travoprost (benzalkonium) 0.004 % ophthalmic solution Commonly known as:  TRAVATAN Place 1 drop into both eyes at bedtime.   vitamin D (CHOLECALCIFEROL) 400 units tablet Take 400 Units by mouth daily.       Allergies:  Allergies  Allergen Reactions  . Ambien [Zolpidem Tartrate] Other (See Comments)    "hallucination"  . Codeine Itching    Family History: Family History  Problem Relation Age of Onset  . Heart disease Mother   . Cancer Father     leukemia  . Heart disease Brother     x2  . Cancer Daughter     breast  . Kidney cancer Neg Hx   . Bladder Cancer Neg Hx     Social History:  reports that she has never smoked. She has never used smokeless tobacco. She reports that she does not  drink alcohol or use drugs.  ROS: UROLOGY Frequent Urination?: Yes Hard to postpone urination?: Yes Burning/pain with urination?: Yes Get up at night to urinate?: Yes Leakage of urine?: Yes Urine stream starts and stops?: No Trouble starting stream?: No Do you have to strain to urinate?: No Blood in urine?: No Urinary tract infection?: Yes  Sexually transmitted disease?: No Injury to kidneys or bladder?: No Painful intercourse?: No Weak stream?: No Currently pregnant?: No Vaginal bleeding?: No Last menstrual period?: n  Gastrointestinal Nausea?: No Vomiting?: No Indigestion/heartburn?: No Diarrhea?: No Constipation?: No  Constitutional Fever: No Night sweats?: No Weight loss?: No Fatigue?: Yes  Skin Skin rash/lesions?: No Itching?: No  Eyes Blurred vision?: No Double vision?: No  Ears/Nose/Throat Sore throat?: No Sinus problems?: No  Hematologic/Lymphatic Swollen glands?: No Easy bruising?: No  Cardiovascular Leg swelling?: No Chest pain?: No  Respiratory Cough?: No Shortness of breath?: Yes  Endocrine Excessive thirst?: No  Musculoskeletal Back pain?: No Joint pain?: No  Neurological Headaches?: Yes Dizziness?: Yes  Psychologic Depression?: Yes Anxiety?: Yes  Physical Exam: BP (!) 152/81   Pulse 80   Ht 5' 2.5" (1.588 m)   Wt 165 lb 6.4 oz (75 kg)   BMI 29.77 kg/m   Constitutional: Well nourished. Alert and oriented, No acute distress. HEENT:  AT, moist mucus membranes. Trachea midline, no masses. Cardiovascular: No clubbing, cyanosis, or edema. Respiratory: Normal respiratory effort, no increased work of breathing. GI: Abdomen is soft, non tender, non distended, no abdominal masses. Liver and spleen not palpable.  No hernias appreciated.  Stool sample for occult testing is not indicated.   GU: No CVA tenderness.  No bladder fullness or masses.  Atrophic external genitalia, normal pubic hair distribution, no lesions.  Normal  urethral meatus, no lesions, no prolapse, no discharge.   No urethral masses, tenderness and/or tenderness. No bladder fullness, tenderness or masses. Pale vagina mucosa, poor estrogen effect, no discharge, no lesions, good pelvic support, Grade II cystocele is noted.  Rectocele noted.  Cervix, uterus and adnexa are surgically absent.  Anus and perineum are without rashes or lesions.    Skin: No rashes, bruises or suspicious lesions. Lymph: No cervical or inguinal adenopathy. Neurologic: Grossly intact, no focal deficits, moving all 4 extremities. Psychiatric: Normal mood and affect.  Laboratory Data: Lab Results  Component Value Date   WBC 7.4 05/31/2016   HGB 13.4 05/31/2016   HCT 40.0 05/31/2016   MCV 97.5 05/31/2016   PLT 270.0 05/31/2016    Lab Results  Component Value Date   CREATININE 0.98 10/18/2016     Lab Results  Component Value Date   HGBA1C 5.8 10/18/2016    Lab Results  Component Value Date   TSH 1.37 05/31/2016       Component Value Date/Time   CHOL 175 10/18/2016 1115   HDL 79.80 10/18/2016 1115   CHOLHDL 2 10/18/2016 1115   VLDL 28.4 10/18/2016 1115   LDLCALC 66 10/18/2016 1115    Lab Results  Component Value Date   AST 21 10/18/2016   Lab Results  Component Value Date   ALT 18 10/18/2016     Urinalysis Unremarkable.  See EPIC  Pertinent Imaging: Results for MIKI, LABUDA (MRN [id number]) as of 12/10/2016 14:22  Ref. Range 12/10/2016 14:07  Scan Result Unknown 24    Assessment & Plan:    1. Recurrent UTI  - criteria for recurrent UTI has been met with 2 or more infections in 6 months or 3 or greater infections in one year   - Patient is instructed to increase their water intake until the urine is pale yellow or clear (10 to 12 cups daily)   - probiotics (yogurt, oral pills or vaginal suppositories), take cranberry pills or drink the juice and Vitamin C 1,000 mg daily to acidify the urine should be  added to their daily regimen   - avoid  soaking in tubs and wipe front to back after urinating   - advised them to have CATH UA's for urinalysis and culture to prevent skin contamination of the specimen  - reviewed symptoms of UTI and advised not to have urine checked or be treated for UTI if not experiencing symptoms  - discussed antibiotic stewardship with the patient    - sent in nitrofurantoin script as they had not received the antibiotic from her PCP   2. Vaginal atrophy  - I explained to the patient that when women go through menopause and her estrogen levels are severely diminished, the normal vaginal flora will change.  This is due to an increase of the vaginal canal's pH. Because of this, the vaginal canal may be colonized by bacteria from the rectum instead of the protective lactobacillus.  This accompanied by the loss of the mucus barrier with vaginal atrophy is a cause of recurrent urinary tract infections.  - In some studies, the use of vaginal estrogen cream has been demonstrated to reduce  recurrent urinary tract infections to one a year.   - Patient was given a sample of vaginal estrogen cream (Premarin) and instructed to apply 0.33m (pea-sized amount)  just inside the vaginal introitus with a finger-tip every night for two weeks and then Monday, Wednesday and Friday nights.  I explained to the patient that vaginally administered estrogen, which causes only a slight increase in the blood estrogen levels, have fewer contraindications and adverse systemic effects that oral HT.  - She will follow up in three months for an exam.                                     Return in about 3 months (around 03/12/2017) for exam and OAB questionnaire.  These notes generated with voice recognition software. I apologize for typographical errors.  SZara [facility] [address] [place] [phone]

## 2016-12-12 ENCOUNTER — Ambulatory Visit (INDEPENDENT_AMBULATORY_CARE_PROVIDER_SITE_OTHER): Payer: Medicare Other | Admitting: Internal Medicine

## 2016-12-12 ENCOUNTER — Encounter: Payer: Self-pay | Admitting: Internal Medicine

## 2016-12-12 DIAGNOSIS — I10 Essential (primary) hypertension: Secondary | ICD-10-CM | POA: Diagnosis not present

## 2016-12-12 DIAGNOSIS — F32A Depression, unspecified: Secondary | ICD-10-CM

## 2016-12-12 DIAGNOSIS — H547 Unspecified visual loss: Secondary | ICD-10-CM

## 2016-12-12 DIAGNOSIS — M25512 Pain in left shoulder: Secondary | ICD-10-CM

## 2016-12-12 DIAGNOSIS — Z87440 Personal history of urinary (tract) infections: Secondary | ICD-10-CM

## 2016-12-12 DIAGNOSIS — F329 Major depressive disorder, single episode, unspecified: Secondary | ICD-10-CM | POA: Diagnosis not present

## 2016-12-12 DIAGNOSIS — M25511 Pain in right shoulder: Secondary | ICD-10-CM | POA: Diagnosis not present

## 2016-12-12 DIAGNOSIS — R296 Repeated falls: Secondary | ICD-10-CM | POA: Diagnosis not present

## 2016-12-12 DIAGNOSIS — E785 Hyperlipidemia, unspecified: Secondary | ICD-10-CM

## 2016-12-12 DIAGNOSIS — R739 Hyperglycemia, unspecified: Secondary | ICD-10-CM

## 2016-12-12 DIAGNOSIS — H5462 Unqualified visual loss, left eye, normal vision right eye: Secondary | ICD-10-CM | POA: Diagnosis not present

## 2016-12-12 MED ORDER — LISINOPRIL 10 MG PO TABS: 10.0000 mg | ORAL_TABLET | Freq: Every day | ORAL | 1 refills | Status: DC

## 2016-12-12 NOTE — Progress Notes (Signed)
Pre visit review using our clinic review tool, if applicable. No additional management support is needed unless otherwise documented below in the visit note. 

## 2016-12-12 NOTE — Progress Notes (Signed)
Patient ID: [name], female   DOB: 02/22/1926, 83 y.o.   MRN: [id number]   Subjective:    Patient ID: [name], female    DOB: 1925-11-18, 83 y.o.   MRN: [id number]  HPI  Patient here as a workin to discuss her elevated blood pressure.  She is accompanied by her daughter.  History obtained from both of them.  She brings in a list of readings.  Blood pressures averaging 140-150s/80s.  Some occasional 160s.  No chest pain.  Breathing stable.  Is limited by her eyesight.  Not able to get around well in her house.  Some weakness and inability to do things around the house.  Daughter requested some help in the home.  Discussed home health.  They are both in agreement.  Eating.  Bowels moving.     Past Medical History:  Diagnosis Date  . Anxiety   . Arthritis   . Cancer [facility])    uterine and questionable vulvar, s/p hysterectomy  . Chronic headaches   . Cystocele   . Dementia    able to sign own papers  . Depression   . GERD (gastroesophageal reflux disease)   . Glaucoma   . Hypercholesterolemia   . Hypertension   . Macular degeneration   . Reactive airway disease   . Rectocele   . Right bundle branch block   . Sleep apnea   . Uterine cancer ([place])   . Vertigo    none - several yrs  . Wears dentures    full upper and lower   Past Surgical History:  Procedure Laterality Date  . ABDOMINAL HYSTERECTOMY    . ABDOMINAL HYSTERECTOMY    . APPENDECTOMY    . CHOLECYSTECTOMY    . IMAGE GUIDED SINUS SURGERY N/A 06/20/2015   Procedure: IMAGE GUIDED SINUS SURGERY;  Surgeon: Clyde Canterbury, MD;  Location: [facility];  Service: ENT;  Laterality: N/A;  GAVE DISK TO CE CE  . REPLACEMENT TOTAL KNEE Left   . SPHENOIDECTOMY Bilateral 06/20/2015   Procedure: SPHENOIDECTOMY;  Surgeon: Clyde Canterbury, MD;  Location: [place];  Service: ENT;  Laterality: Bilateral;  . uterine cancer    . VULVECTOMY     Family History  Problem Relation Age of Onset  . Heart disease Mother     . Cancer Father     leukemia  . Heart disease Brother     x2  . Cancer Daughter     breast  . Kidney cancer Neg Hx   . Bladder Cancer Neg Hx    Social History   Social History  . Marital status: Married    Spouse name: N/A  . Number of children: N/A  . Years of education: N/A   Social History Main Topics  . Smoking status: Never Smoker  . Smokeless tobacco: Never Used  . Alcohol use No  . Drug use: No  . Sexual activity: No   Other Topics Concern  . None   Social History Narrative   ** Merged History Encounter **        Outpatient Encounter Prescriptions as of 12/12/2016  Medication Sig  . Aspirin-Salicylamide-Caffeine (BC HEADACHE POWDER PO) Take 1 packet by mouth daily as needed (pain/headache).  [name] atorvastatin (LIPITOR) 40 MG tablet TAKE 1 TABLET AT 6PM  . brimonidine (ALPHAGAN P) 0.1 % SOLN Place 1 drop into both eyes 2 (two) times daily.  . brinzolamide (AZOPT) 1 % ophthalmic suspension Place 1 drop into the left  eye 3 (three) times daily.  . [EXPIRED] cefdinir (OMNICEF) 300 MG capsule Take 1 capsule (300 mg total) by mouth 2 (two) times daily.  . fluticasone (FLONASE) 50 MCG/ACT nasal spray Place 2 sprays into both nostrils daily. (Patient taking differently: Place 2 sprays into both nostrils daily as needed (seasonal allergies). )  . hydrochlorothiazide (MICROZIDE) 12.5 MG capsule Take 1 capsule (12.5 mg total) by mouth daily.  [name] latanoprost (XALATAN) 0.005 % ophthalmic solution Place 1 drop into both eyes at bedtime.  . meclizine (ANTIVERT) 25 MG tablet Take 1 tablet (25 mg total) by mouth 3 (three) times daily as needed for dizziness.  . memantine (NAMENDA) 10 MG tablet Take 10 mg by mouth 2 (two) times daily.   . mirtazapine (REMERON) 30 MG tablet Take 30 mg by mouth at bedtime.  . Multiple Vitamin (MULTIVITAMIN WITH MINERALS) TABS tablet Take 1 tablet by mouth daily.  . Multiple Vitamins-Minerals (PRESERVISION AREDS) TABS Take 1 tablet by mouth daily.   .  mupirocin ointment (BACTROBAN) 2 % Apply to affected area on chest and scalp bid x 1 week.  . nitrofurantoin, macrocrystal-monohydrate, (MACROBID) 100 MG capsule Take 1 capsule (100 mg total) by mouth every 12 (twelve) hours.  . Omega-3 Fatty Acids (FISH OIL CONCENTRATE PO) Take 1 capsule by mouth daily.   [name] omeprazole (PRILOSEC) 20 MG capsule Take 1 capsule (20 mg total) by mouth every morning.  [name] oxybutynin (OXYTROL) 3.9 MG/24HR Place 1 patch onto the skin every 3 (three) days.  . rosuvastatin (CRESTOR) 20 MG tablet Take 1 tablet (20 mg total) by mouth at bedtime.  . sertraline (ZOLOFT) 100 MG tablet Take 150 mg by mouth daily.   . SERTRALINE HCL PO Take 100 mg by mouth daily.  . travoprost, benzalkonium, (TRAVATAN) 0.004 % ophthalmic solution Place 1 drop into both eyes at bedtime.  . vitamin D, CHOLECALCIFEROL, 400 UNITS tablet Take 400 Units by mouth daily.  [name] amoxicillin-clavulanate (AUGMENTIN) 500-125 MG tablet Take 1 tablet (500 mg total) by mouth 2 (two) times daily. (Patient not taking: Reported on 12/10/2016)  . lisinopril (PRINIVIL,ZESTRIL) 10 MG tablet Take 1 tablet (10 mg total) by mouth daily.   No facility-administered encounter medications on file as of 12/12/2016.     Review of Systems  Constitutional: Negative for appetite change and unexpected weight change.  HENT: Negative for congestion and sinus pressure.   Respiratory: Negative for cough, chest tightness and shortness of breath.   Cardiovascular: Negative for chest pain, palpitations and leg swelling.  Gastrointestinal: Negative for abdominal pain, diarrhea, nausea and vomiting.  Genitourinary: Negative for difficulty urinating and dysuria.  Musculoskeletal: Negative for joint swelling.       Bilateral shoulder pain.    Skin: Negative for color change and rash.  Neurological: Negative for dizziness and headaches.  Psychiatric/Behavioral: Negative for agitation. The patient is not nervous/anxious.        Objective:      Physical Exam  Constitutional: She appears well-developed and well-nourished. No distress.  HENT:  Nose: Nose normal.  Mouth/Throat: Oropharynx is clear and moist.  Neck: Neck supple. No thyromegaly present.  Cardiovascular: Normal rate and regular rhythm.   Pulmonary/Chest: Breath sounds normal. No respiratory distress. She has no wheezes.  Abdominal: Soft. Bowel sounds are normal. There is no tenderness.  Musculoskeletal: She exhibits no edema or tenderness.  Lymphadenopathy:    She has no cervical adenopathy.  Skin: No rash noted. No erythema.  Psychiatric: She has a normal mood and  affect. Her behavior is normal.    BP (!) 160/88 (BP Location: Left Arm, Patient Position: Sitting, Cuff Size: Normal)   Pulse 84   Temp 98.1 F (36.7 C) (Oral)   Resp 18   SpO2 94%  Wt Readings from Last 3 Encounters:  12/10/16 165 lb 6.4 oz (75 kg)  12/03/16 167 lb 12.8 oz (76.1 kg)  10/18/16 164 lb (74.4 kg)     Lab Results  Component Value Date   WBC 7.4 05/31/2016   HGB 13.4 05/31/2016   HCT 40.0 05/31/2016   PLT 270.0 05/31/2016   GLUCOSE 103 (H) 10/18/2016   CHOL 175 10/18/2016   TRIG 142.0 10/18/2016   HDL 79.80 10/18/2016   LDLCALC 66 10/18/2016   ALT 18 10/18/2016   AST 21 10/18/2016   NA 141 10/18/2016   K 4.5 10/18/2016   CL 107 10/18/2016   CREATININE 0.98 10/18/2016   BUN 18 10/18/2016   CO2 27 10/18/2016   TSH 1.37 05/31/2016   INR 1.07 01/15/2016   HGBA1C 5.8 10/18/2016   MICROALBUR 1.0 03/01/2015       Assessment & Plan:   Problem List Items Addressed This Visit    Depression    Seeing Dr Nicolasa Ducking.  Appears to be stable.       Essential hypertension, benign    Blood pressure remaining elevated.  Start lisinopril 55m q day.  Check metabolic panel in 144-62days.        Relevant Medications   lisinopril (PRINIVIL,ZESTRIL) 10 MG tablet   Other Relevant Orders   Basic metabolic panel   Frequent falls    Has had intermittent falls.  Some decrease in  strength.  Also limited by her vision.  Arrange home health for evaluation.        Relevant Orders   Ambulatory referral to Home Health   History of frequent urinary tract infections    Saw urology.  On prophylactic abx.  Follow.        Hyperglycemia    Low carb diet and exercise.  Follow met b and a1c.        Hyperlipidemia    Low cholesterol diet and exercise.  Follow lipid panel.       Relevant Medications   lisinopril (PRINIVIL,ZESTRIL) 10 MG tablet   Loss of vision    As outlined.  Limits activity.        Relevant Orders   Ambulatory referral to Home Health   Shoulder pain    Bilateral shoulder pain.  Persistent.  Bone on bone.  Has seen ortho.  Limits her activity.  Limits ability to ADLs.        Vision loss, left eye    Limits her mobility and ability to do things around the house.  Discussed home health assessment.            SEinar Pheasant MD

## 2016-12-16 DIAGNOSIS — L908 Other atrophic disorders of skin: Secondary | ICD-10-CM | POA: Diagnosis not present

## 2016-12-16 DIAGNOSIS — L578 Other skin changes due to chronic exposure to nonionizing radiation: Secondary | ICD-10-CM | POA: Diagnosis not present

## 2016-12-16 DIAGNOSIS — C4441 Basal cell carcinoma of skin of scalp and neck: Secondary | ICD-10-CM | POA: Diagnosis not present

## 2016-12-16 DIAGNOSIS — L814 Other melanin hyperpigmentation: Secondary | ICD-10-CM | POA: Diagnosis not present

## 2016-12-16 NOTE — Assessment & Plan Note (Signed)
Saw urology.  On prophylactic abx.  Follow.

## 2016-12-16 NOTE — Assessment & Plan Note (Signed)
Seeing Dr Nicolasa Ducking.  Appears to be stable.

## 2016-12-16 NOTE — Assessment & Plan Note (Signed)
Low cholesterol diet and exercise.  Follow lipid panel.   

## 2016-12-16 NOTE — Assessment & Plan Note (Signed)
Limits her mobility and ability to do things around the house.  Discussed home health assessment.

## 2016-12-16 NOTE — Assessment & Plan Note (Signed)
As outlined.  Limits activity.

## 2016-12-16 NOTE — Assessment & Plan Note (Signed)
Blood pressure remaining elevated.  Start lisinopril 10mg  q day.  Check metabolic panel in 86-85 days.

## 2016-12-16 NOTE — Assessment & Plan Note (Signed)
Low carb diet and exercise.  Follow met b and a1c.   

## 2016-12-16 NOTE — Assessment & Plan Note (Signed)
Has had intermittent falls.  Some decrease in strength.  Also limited by her vision.  Arrange home health for evaluation.

## 2016-12-16 NOTE — Assessment & Plan Note (Signed)
Bilateral shoulder pain.  Persistent.  Bone on bone.  Has seen ortho.  Limits her activity.  Limits ability to ADLs.

## 2016-12-20 DIAGNOSIS — M19012 Primary osteoarthritis, left shoulder: Secondary | ICD-10-CM | POA: Diagnosis not present

## 2016-12-20 DIAGNOSIS — M19011 Primary osteoarthritis, right shoulder: Secondary | ICD-10-CM | POA: Diagnosis not present

## 2016-12-23 ENCOUNTER — Telehealth: Payer: Self-pay | Admitting: Internal Medicine

## 2016-12-23 DIAGNOSIS — M25512 Pain in left shoulder: Secondary | ICD-10-CM | POA: Diagnosis not present

## 2016-12-23 DIAGNOSIS — M25511 Pain in right shoulder: Secondary | ICD-10-CM | POA: Diagnosis not present

## 2016-12-23 NOTE — Telephone Encounter (Signed)
[name] from [facility] called and is looking for verbal orders for physical therapy starting today 2x for 4 weeks and 1x for 1 week. He will be working on [employer], home exercise, and gait training. He would like to add OT eval. Please advise, thank you!  Call [facility] @ [phone]

## 2016-12-24 ENCOUNTER — Other Ambulatory Visit: Payer: Self-pay

## 2016-12-24 ENCOUNTER — Telehealth: Payer: Self-pay | Admitting: Internal Medicine

## 2016-12-24 DIAGNOSIS — Z760 Encounter for issue of repeat prescription: Secondary | ICD-10-CM

## 2016-12-24 MED ORDER — OMEPRAZOLE 20 MG PO CPDR: 20.0000 mg | DELAYED_RELEASE_CAPSULE | Freq: Every morning | ORAL | 0 refills | Status: DC

## 2016-12-24 NOTE — Telephone Encounter (Signed)
Pharmacy called and needs a verbal order for pt's omeprazole (PRILOSEC) 20 MG capsule. Pt is switching from [name] to [facility] so they need a verbal. Please advise, thank you!  [place], [place]

## 2016-12-24 NOTE — Telephone Encounter (Signed)
Error

## 2016-12-24 NOTE — Telephone Encounter (Signed)
Ok

## 2016-12-24 NOTE — Telephone Encounter (Signed)
Script refilled and sent to [facility].

## 2016-12-24 NOTE — Telephone Encounter (Signed)
Per Dr Nicki Reaper orders given as below

## 2016-12-24 NOTE — Telephone Encounter (Signed)
ok 

## 2016-12-24 NOTE — Telephone Encounter (Signed)
Called back to give order he stated he changed orders as  follows Physical therapy 2X for  3 weeks, 1x week 2 weeks Occucupational Therapy for Evaluation and Treatment , Social work Evaluation Home Health Aid once daily for a week Twice a week for a week Once a week for two weeks

## 2016-12-25 ENCOUNTER — Telehealth: Payer: Self-pay | Admitting: Internal Medicine

## 2016-12-25 NOTE — Telephone Encounter (Signed)
Florene Glen from [facility] called and wanted to follow up on this patient. Please advise, thank you!  Call [facility] @ [phone]

## 2016-12-26 ENCOUNTER — Telehealth: Payer: Self-pay | Admitting: Internal Medicine

## 2016-12-26 DIAGNOSIS — M25511 Pain in right shoulder: Secondary | ICD-10-CM | POA: Diagnosis not present

## 2016-12-26 DIAGNOSIS — M25512 Pain in left shoulder: Secondary | ICD-10-CM | POA: Diagnosis not present

## 2016-12-26 NOTE — Telephone Encounter (Signed)
[name] from [facility] called with verbal orders. He is looking for 1x for 5 weeks, he will being helping with IDAL, ADL, transfers, mobility and exercise. Ok to discharge when goal is met or max potential. Please advise, thank you!  Call [facility] @ [phone]

## 2016-12-26 NOTE — Telephone Encounter (Signed)
Please advise 

## 2016-12-27 ENCOUNTER — Other Ambulatory Visit (INDEPENDENT_AMBULATORY_CARE_PROVIDER_SITE_OTHER): Payer: Medicare Other

## 2016-12-27 ENCOUNTER — Telehealth: Payer: Self-pay | Admitting: Radiology

## 2016-12-27 DIAGNOSIS — I10 Essential (primary) hypertension: Secondary | ICD-10-CM | POA: Diagnosis not present

## 2016-12-27 DIAGNOSIS — M25512 Pain in left shoulder: Secondary | ICD-10-CM | POA: Diagnosis not present

## 2016-12-27 DIAGNOSIS — M25511 Pain in right shoulder: Secondary | ICD-10-CM | POA: Diagnosis not present

## 2016-12-27 LAB — BASIC METABOLIC PANEL
BUN: 24 mg/dL — ABNORMAL HIGH (ref 6–23)
CHLORIDE: 109 meq/L (ref 96–112)
CO2: 25 mEq/L (ref 19–32)
CREATININE: 1.08 mg/dL (ref 0.40–1.20)
Calcium: 9.5 mg/dL (ref 8.4–10.5)
GFR: 50.8 mL/min — AB (ref >60.00)
Glucose, Bld: 99 mg/dL (ref 70–99)
POTASSIUM: 4.5 meq/L (ref 3.5–5.1)
Sodium: 140 mEq/L (ref 135–145)

## 2016-12-27 NOTE — Telephone Encounter (Signed)
Ok

## 2016-12-27 NOTE — Telephone Encounter (Signed)
Called gave verbal information on v/m

## 2016-12-27 NOTE — Telephone Encounter (Signed)
Pt came in for labs mentioned she was having a burning sensation when urinating onset for a month. Obtained a urine specimen from patient while she was here. Would you like to order UA or anything?

## 2016-12-27 NOTE — Telephone Encounter (Signed)
Per Dr. Nicki Reaper, pt needs to be seen for urinary symptoms, can you call patient to schedule appt? Thank you

## 2016-12-27 NOTE — Telephone Encounter (Signed)
alled wife she gave information and was trying to see what we can do for app with another provider she asked that I talk to  with her husband. I was trying to given him all the options and he became very agitated and started becoming agressive. I repeated the information from Dr. Nicki Reaper and suggested that he take her to walk in to be treated.

## 2016-12-27 NOTE — Telephone Encounter (Signed)
Patient DPR is meeting with Ms. Virl Cagey on 01/07/17

## 2016-12-27 NOTE — Telephone Encounter (Signed)
Reviewed message.  I am not in the office today.  If pt having persistent issues, she needs to be seen.

## 2016-12-30 ENCOUNTER — Other Ambulatory Visit: Payer: Self-pay | Admitting: Internal Medicine

## 2016-12-30 DIAGNOSIS — M25512 Pain in left shoulder: Secondary | ICD-10-CM | POA: Diagnosis not present

## 2016-12-30 DIAGNOSIS — M25511 Pain in right shoulder: Secondary | ICD-10-CM | POA: Diagnosis not present

## 2016-12-30 DIAGNOSIS — R7989 Other specified abnormal findings of blood chemistry: Secondary | ICD-10-CM

## 2016-12-30 NOTE — Progress Notes (Signed)
Order placed for f/u met b 

## 2016-12-31 DIAGNOSIS — M25511 Pain in right shoulder: Secondary | ICD-10-CM | POA: Diagnosis not present

## 2016-12-31 DIAGNOSIS — M25512 Pain in left shoulder: Secondary | ICD-10-CM | POA: Diagnosis not present

## 2017-01-01 DIAGNOSIS — M25512 Pain in left shoulder: Secondary | ICD-10-CM | POA: Diagnosis not present

## 2017-01-01 DIAGNOSIS — M25511 Pain in right shoulder: Secondary | ICD-10-CM | POA: Diagnosis not present

## 2017-01-02 ENCOUNTER — Telehealth: Payer: Self-pay | Admitting: Internal Medicine

## 2017-01-02 DIAGNOSIS — M25512 Pain in left shoulder: Secondary | ICD-10-CM | POA: Diagnosis not present

## 2017-01-02 DIAGNOSIS — M25511 Pain in right shoulder: Secondary | ICD-10-CM | POA: Diagnosis not present

## 2017-01-02 NOTE — Telephone Encounter (Signed)
[name] from [facility] [facility] called looking for an update on some paperwork that she sent over on 3/16 for [facility]. Please advise, thank you!  Call [name] @ [phone]

## 2017-01-02 NOTE — Telephone Encounter (Signed)
Called her let her know that I do not see any notes on receiving she will refax to my attention.

## 2017-01-03 DIAGNOSIS — M25512 Pain in left shoulder: Secondary | ICD-10-CM | POA: Diagnosis not present

## 2017-01-03 DIAGNOSIS — M25511 Pain in right shoulder: Secondary | ICD-10-CM | POA: Diagnosis not present

## 2017-01-06 DIAGNOSIS — Z9181 History of falling: Secondary | ICD-10-CM

## 2017-01-06 DIAGNOSIS — M25511 Pain in right shoulder: Secondary | ICD-10-CM | POA: Diagnosis not present

## 2017-01-06 DIAGNOSIS — N8110 Cystocele, unspecified: Secondary | ICD-10-CM | POA: Diagnosis not present

## 2017-01-06 DIAGNOSIS — H3530 Unspecified macular degeneration: Secondary | ICD-10-CM | POA: Diagnosis not present

## 2017-01-06 DIAGNOSIS — R42 Dizziness and giddiness: Secondary | ICD-10-CM | POA: Diagnosis not present

## 2017-01-06 DIAGNOSIS — Z8542 Personal history of malignant neoplasm of other parts of uterus: Secondary | ICD-10-CM

## 2017-01-06 DIAGNOSIS — I4510 Unspecified right bundle-branch block: Secondary | ICD-10-CM | POA: Diagnosis not present

## 2017-01-06 DIAGNOSIS — M25512 Pain in left shoulder: Secondary | ICD-10-CM | POA: Diagnosis not present

## 2017-01-06 DIAGNOSIS — F319 Bipolar disorder, unspecified: Secondary | ICD-10-CM | POA: Diagnosis not present

## 2017-01-06 DIAGNOSIS — Z87440 Personal history of urinary (tract) infections: Secondary | ICD-10-CM

## 2017-01-06 DIAGNOSIS — H5462 Unqualified visual loss, left eye, normal vision right eye: Secondary | ICD-10-CM | POA: Diagnosis not present

## 2017-01-06 DIAGNOSIS — F0390 Unspecified dementia without behavioral disturbance: Secondary | ICD-10-CM | POA: Diagnosis not present

## 2017-01-06 DIAGNOSIS — J45909 Unspecified asthma, uncomplicated: Secondary | ICD-10-CM | POA: Diagnosis not present

## 2017-01-06 DIAGNOSIS — H409 Unspecified glaucoma: Secondary | ICD-10-CM | POA: Diagnosis not present

## 2017-01-06 DIAGNOSIS — F419 Anxiety disorder, unspecified: Secondary | ICD-10-CM

## 2017-01-06 DIAGNOSIS — I10 Essential (primary) hypertension: Secondary | ICD-10-CM | POA: Diagnosis not present

## 2017-01-06 NOTE — Telephone Encounter (Signed)
Form completed and in box.

## 2017-01-06 NOTE — Telephone Encounter (Signed)
Received ppw have put in your box with med list.

## 2017-01-06 NOTE — Telephone Encounter (Signed)
Faxed

## 2017-01-07 DIAGNOSIS — M25512 Pain in left shoulder: Secondary | ICD-10-CM | POA: Diagnosis not present

## 2017-01-07 DIAGNOSIS — M25511 Pain in right shoulder: Secondary | ICD-10-CM | POA: Diagnosis not present

## 2017-01-07 NOTE — Telephone Encounter (Signed)
Angelica [phone] called back wanting to know pt main Dx? Please advise? Thank you!

## 2017-01-08 NOTE — Telephone Encounter (Signed)
Frequent falls, loss of vision (just a couple that limit her activity).  If needs more, we should have a copy of form.  I thought I wrote diagnosis on form.

## 2017-01-08 NOTE — Telephone Encounter (Signed)
Not sure what code to tell her. Please advise.

## 2017-01-08 NOTE — Telephone Encounter (Signed)
Left message on [name]'s voicemail

## 2017-01-09 DIAGNOSIS — M25511 Pain in right shoulder: Secondary | ICD-10-CM | POA: Diagnosis not present

## 2017-01-09 DIAGNOSIS — M25512 Pain in left shoulder: Secondary | ICD-10-CM | POA: Diagnosis not present

## 2017-01-10 DIAGNOSIS — M25511 Pain in right shoulder: Secondary | ICD-10-CM | POA: Diagnosis not present

## 2017-01-10 DIAGNOSIS — M25512 Pain in left shoulder: Secondary | ICD-10-CM | POA: Diagnosis not present

## 2017-01-14 DIAGNOSIS — M25512 Pain in left shoulder: Secondary | ICD-10-CM | POA: Diagnosis not present

## 2017-01-14 DIAGNOSIS — M25511 Pain in right shoulder: Secondary | ICD-10-CM | POA: Diagnosis not present

## 2017-01-16 DIAGNOSIS — H353134 Nonexudative age-related macular degeneration, bilateral, advanced atrophic with subfoveal involvement: Secondary | ICD-10-CM | POA: Diagnosis not present

## 2017-01-17 DIAGNOSIS — M25511 Pain in right shoulder: Secondary | ICD-10-CM | POA: Diagnosis not present

## 2017-01-17 DIAGNOSIS — M19212 Secondary osteoarthritis, left shoulder: Secondary | ICD-10-CM | POA: Diagnosis not present

## 2017-01-17 DIAGNOSIS — M25512 Pain in left shoulder: Secondary | ICD-10-CM | POA: Diagnosis not present

## 2017-01-20 ENCOUNTER — Other Ambulatory Visit (INDEPENDENT_AMBULATORY_CARE_PROVIDER_SITE_OTHER): Payer: Medicare Other

## 2017-01-20 DIAGNOSIS — R7989 Other specified abnormal findings of blood chemistry: Secondary | ICD-10-CM

## 2017-01-20 LAB — BASIC METABOLIC PANEL
BUN: 23 mg/dL (ref 6–23)
CO2: 26 mEq/L (ref 19–32)
CREATININE: 1.01 mg/dL (ref 0.40–1.20)
Calcium: 9.7 mg/dL (ref 8.4–10.5)
Chloride: 109 mEq/L (ref 96–112)
GFR: 54.87 mL/min — AB (ref >60.00)
Glucose, Bld: 103 mg/dL — ABNORMAL HIGH (ref 70–99)
Potassium: 4.3 mEq/L (ref 3.5–5.1)
Sodium: 142 mEq/L (ref 135–145)

## 2017-01-21 ENCOUNTER — Telehealth: Payer: Self-pay

## 2017-01-21 DIAGNOSIS — M25512 Pain in left shoulder: Secondary | ICD-10-CM | POA: Diagnosis not present

## 2017-01-21 DIAGNOSIS — M25511 Pain in right shoulder: Secondary | ICD-10-CM | POA: Diagnosis not present

## 2017-01-21 NOTE — Telephone Encounter (Signed)
Patient requested a call at [phone]

## 2017-01-21 NOTE — Telephone Encounter (Signed)
-----   Message from Einar Pheasant, MD sent at 01/20/2017 11:44 PM EDT ----- Notify pt kidney function has improved.

## 2017-01-21 NOTE — Telephone Encounter (Signed)
Called patient gave all information had no questions.

## 2017-01-21 NOTE — Telephone Encounter (Signed)
Left message to return call to our office.  

## 2017-01-23 ENCOUNTER — Ambulatory Visit (INDEPENDENT_AMBULATORY_CARE_PROVIDER_SITE_OTHER): Payer: Medicare Other | Admitting: Internal Medicine

## 2017-01-23 ENCOUNTER — Encounter: Payer: Self-pay | Admitting: Internal Medicine

## 2017-01-23 VITALS — BP 132/70 | HR 74 | Temp 98.6°F | Resp 12 | Ht 63.0 in | Wt 167.4 lb

## 2017-01-23 DIAGNOSIS — R739 Hyperglycemia, unspecified: Secondary | ICD-10-CM | POA: Diagnosis not present

## 2017-01-23 DIAGNOSIS — E7800 Pure hypercholesterolemia, unspecified: Secondary | ICD-10-CM

## 2017-01-23 DIAGNOSIS — Z87440 Personal history of urinary (tract) infections: Secondary | ICD-10-CM | POA: Diagnosis not present

## 2017-01-23 DIAGNOSIS — I10 Essential (primary) hypertension: Secondary | ICD-10-CM | POA: Diagnosis not present

## 2017-01-23 DIAGNOSIS — F329 Major depressive disorder, single episode, unspecified: Secondary | ICD-10-CM

## 2017-01-23 DIAGNOSIS — I272 Pulmonary hypertension, unspecified: Secondary | ICD-10-CM | POA: Diagnosis not present

## 2017-01-23 DIAGNOSIS — F32A Depression, unspecified: Secondary | ICD-10-CM

## 2017-01-23 LAB — URINALYSIS, ROUTINE W REFLEX MICROSCOPIC
BILIRUBIN URINE: NEGATIVE
Hgb urine dipstick: NEGATIVE
LEUKOCYTES UA: NEGATIVE
Nitrite: NEGATIVE
RBC / HPF: NONE SEEN (ref 0–?)
SPECIFIC GRAVITY, URINE: 1.025 (ref 1.000–1.030)
URINE GLUCOSE: NEGATIVE
UROBILINOGEN UA: 0.2 (ref 0.0–1.0)
WBC UA: NONE SEEN (ref 0–?)
pH: 5.5 (ref 5.0–8.0)

## 2017-01-23 NOTE — Progress Notes (Signed)
Pre-visit discussion using our clinic review tool. No additional management support is needed unless otherwise documented below in the visit note.  

## 2017-01-24 DIAGNOSIS — M25511 Pain in right shoulder: Secondary | ICD-10-CM | POA: Diagnosis not present

## 2017-01-24 DIAGNOSIS — M25512 Pain in left shoulder: Secondary | ICD-10-CM | POA: Diagnosis not present

## 2017-01-24 LAB — URINE CULTURE

## 2017-01-25 ENCOUNTER — Other Ambulatory Visit: Payer: Self-pay | Admitting: Internal Medicine

## 2017-01-27 ENCOUNTER — Telehealth: Payer: Self-pay

## 2017-01-27 NOTE — Telephone Encounter (Signed)
-----   Message from Einar Pheasant, MD sent at 01/25/2017  9:39 PM EDT ----- Notify pt that her urine culture is negative for infection.  Given her persistent reoccurring symptoms, notify her needs f/u with urology.

## 2017-01-27 NOTE — Telephone Encounter (Signed)
Left message to return call to our office.  

## 2017-01-27 NOTE — Telephone Encounter (Signed)
Noted  

## 2017-01-28 NOTE — Telephone Encounter (Signed)
Patient husband informed.

## 2017-01-29 DIAGNOSIS — M25512 Pain in left shoulder: Secondary | ICD-10-CM | POA: Diagnosis not present

## 2017-01-29 DIAGNOSIS — M25511 Pain in right shoulder: Secondary | ICD-10-CM | POA: Diagnosis not present

## 2017-02-03 ENCOUNTER — Encounter: Payer: Self-pay | Admitting: Internal Medicine

## 2017-02-03 NOTE — Assessment & Plan Note (Signed)
On crestor.  Low cholesterol diet and exercise.  Follow lipid panel and liver function tests.   

## 2017-02-03 NOTE — Assessment & Plan Note (Signed)
Has seen cardiology and pulmonary.

## 2017-02-03 NOTE — Assessment & Plan Note (Signed)
Followed by psychiatry.  Stable.  

## 2017-02-03 NOTE — Assessment & Plan Note (Signed)
Blood pressure doing better on lisinopril.  Recheck improved.  Follow.

## 2017-02-03 NOTE — Assessment & Plan Note (Signed)
Seeing urology.  Will recheck today to confirm no infection.

## 2017-02-03 NOTE — Assessment & Plan Note (Signed)
Low carb diet and exercise.  Follow met b and a1c.   

## 2017-02-03 NOTE — Progress Notes (Signed)
Patient ID: [name], female   DOB: 1925-11-11, 83 y.o.   MRN: [id number]   Subjective:    Patient ID: [name], female    DOB: 11-23-1925, 83 y.o.   MRN: [id number]  HPI  Patient here for a scheduled follow up.  She is accompanied by her husband.  History obtained from both of them.  She reports she has noticed some increased urinary pressure and some nocturia.  Wants checked to see if uti present.  No chest pain.  No sob.  No acid reflux.  No abdominal pain.  Bowels stable.  Saw Dr Ernestine Conrad.  Recommended using estrogen cream.  She is not using.  States it burned when she used it.  Informed to discuss with urology.     Past Medical History:  Diagnosis Date  . Anxiety   . Arthritis   . Cancer [facility])    uterine and questionable vulvar, s/p hysterectomy  . Chronic headaches   . Cystocele   . Dementia    able to sign own papers  . Depression   . GERD (gastroesophageal reflux disease)   . Glaucoma   . Hypercholesterolemia   . Hypertension   . Macular degeneration   . Reactive airway disease   . Rectocele   . Right bundle branch block   . Sleep apnea   . Uterine cancer ([place])   . Vertigo    none - several yrs  . Wears dentures    full upper and lower   Past Surgical History:  Procedure Laterality Date  . ABDOMINAL HYSTERECTOMY    . ABDOMINAL HYSTERECTOMY    . APPENDECTOMY    . CHOLECYSTECTOMY    . IMAGE GUIDED SINUS SURGERY N/A 06/20/2015   Procedure: IMAGE GUIDED SINUS SURGERY;  Surgeon: Clyde Canterbury, MD;  Location: [place];  Service: ENT;  Laterality: N/A;  GAVE DISK TO CE CE  . REPLACEMENT TOTAL KNEE Left   . SPHENOIDECTOMY Bilateral 06/20/2015   Procedure: SPHENOIDECTOMY;  Surgeon: Clyde Canterbury, MD;  Location: [place];  Service: ENT;  Laterality: Bilateral;  . uterine cancer    . VULVECTOMY     Family History  Problem Relation Age of Onset  . Heart disease Mother   . Cancer Father     leukemia  . Heart disease Brother     x2  . Cancer  Daughter     breast  . Kidney cancer Neg Hx   . Bladder Cancer Neg Hx     Outpatient Encounter Prescriptions as of 01/23/2017  Medication Sig  . amoxicillin-clavulanate (AUGMENTIN) 500-125 MG tablet Take 1 tablet (500 mg total) by mouth 2 (two) times daily.  . Aspirin-Salicylamide-Caffeine (BC HEADACHE POWDER PO) Take 1 packet by mouth daily as needed (pain/headache).  . brimonidine (ALPHAGAN P) 0.1 % SOLN Place 1 drop into both eyes 2 (two) times daily.  . brinzolamide (AZOPT) 1 % ophthalmic suspension Place 1 drop into the left eye 3 (three) times daily.  . fluticasone (FLONASE) 50 MCG/ACT nasal spray Place 2 sprays into both nostrils daily. (Patient taking differently: Place 2 sprays into both nostrils daily as needed (seasonal allergies). )  . hydrochlorothiazide (MICROZIDE) 12.5 MG capsule Take 1 capsule (12.5 mg total) by mouth daily.  [name] latanoprost (XALATAN) 0.005 % ophthalmic solution Place 1 drop into both eyes at bedtime.  . meclizine (ANTIVERT) 25 MG tablet Take 1 tablet (25 mg total) by mouth 3 (three) times daily as needed for dizziness.  . memantine (  NAMENDA) 10 MG tablet Take 10 mg by mouth 2 (two) times daily.   . mirtazapine (REMERON) 30 MG tablet Take 30 mg by mouth at bedtime.  . Multiple Vitamin (MULTIVITAMIN WITH MINERALS) TABS tablet Take 1 tablet by mouth daily.  . Multiple Vitamins-Minerals (PRESERVISION AREDS) TABS Take 1 tablet by mouth daily.   . mupirocin ointment (BACTROBAN) 2 % Apply to affected area on chest and scalp bid x 1 week.  . nitrofurantoin, macrocrystal-monohydrate, (MACROBID) 100 MG capsule Take 1 capsule (100 mg total) by mouth every 12 (twelve) hours.  . Omega-3 Fatty Acids (FISH OIL CONCENTRATE PO) Take 1 capsule by mouth daily.   [name] omeprazole (PRILOSEC) 20 MG capsule Take 1 capsule (20 mg total) by mouth every morning.  [name] oxybutynin (OXYTROL) 3.9 MG/24HR Place 1 patch onto the skin every 3 (three) days.  . rosuvastatin (CRESTOR) 20 MG tablet Take  1 tablet (20 mg total) by mouth at bedtime.  . sertraline (ZOLOFT) 100 MG tablet Take 150 mg by mouth daily.   . SERTRALINE HCL PO Take 100 mg by mouth daily.  . travoprost, benzalkonium, (TRAVATAN) 0.004 % ophthalmic solution Place 1 drop into both eyes at bedtime.  . vitamin D, CHOLECALCIFEROL, 400 UNITS tablet Take 400 Units by mouth daily.  . [DISCONTINUED] atorvastatin (LIPITOR) 40 MG tablet TAKE 1 TABLET AT 6PM  . [DISCONTINUED] lisinopril (PRINIVIL,ZESTRIL) 10 MG tablet Take 1 tablet (10 mg total) by mouth daily.   No facility-administered encounter medications on file as of 01/23/2017.     Review of Systems  Constitutional: Negative for appetite change and unexpected weight change.  HENT: Negative for congestion and sinus pressure.   Respiratory: Negative for cough, chest tightness and shortness of breath.   Cardiovascular: Negative for chest pain, palpitations and leg swelling.  Gastrointestinal: Negative for abdominal pain, diarrhea, nausea and vomiting.  Genitourinary: Negative for difficulty urinating and dysuria.  Musculoskeletal: Negative for joint swelling and myalgias.  Skin: Negative for color change and rash.  Neurological: Negative for dizziness, light-headedness and headaches.  Psychiatric/Behavioral: Negative for agitation and dysphoric mood.       Objective:     Blood pressure rechecked by me:  132/70  Physical Exam  Constitutional: She appears well-developed and well-nourished. No distress.  HENT:  Nose: Nose normal.  Mouth/Throat: Oropharynx is clear and moist.  Neck: Neck supple. No thyromegaly present.  Cardiovascular: Normal rate and regular rhythm.   Pulmonary/Chest: Breath sounds normal. No respiratory distress. She has no wheezes.  Abdominal: Soft. Bowel sounds are normal. There is no tenderness.  Musculoskeletal: She exhibits no edema or tenderness.  Lymphadenopathy:    She has no cervical adenopathy.  Skin: No rash noted. No erythema.    Psychiatric: She has a normal mood and affect. Her behavior is normal.    BP 132/70   Pulse 74   Temp 98.6 F (37 C) (Oral)   Resp 12   Ht '5\' 3"'$  (1.6 m)   Wt 167 lb 6.4 oz (75.9 kg)   SpO2 96%   BMI 29.65 kg/m  Wt Readings from Last 3 Encounters:  01/23/17 167 lb 6.4 oz (75.9 kg)  12/10/16 165 lb 6.4 oz (75 kg)  12/03/16 167 lb 12.8 oz (76.1 kg)     Lab Results  Component Value Date   WBC 7.4 05/31/2016   HGB 13.4 05/31/2016   HCT 40.0 05/31/2016   PLT 270.0 05/31/2016   GLUCOSE 103 (H) 01/20/2017   CHOL 175 10/18/2016   TRIG  142.0 10/18/2016   HDL 79.80 10/18/2016   LDLCALC 66 10/18/2016   ALT 18 10/18/2016   AST 21 10/18/2016   NA 142 01/20/2017   K 4.3 01/20/2017   CL 109 01/20/2017   CREATININE 1.01 01/20/2017   BUN 23 01/20/2017   CO2 26 01/20/2017   TSH 1.37 05/31/2016   INR 1.07 01/15/2016   HGBA1C 5.8 10/18/2016   MICROALBUR 1.0 03/01/2015    Dg Chest 2 View  Result Date: 03/01/2016 CLINICAL DATA:  Recent stroke-like episode EXAM: CHEST  2 VIEW COMPARISON:  None. FINDINGS: The heart size and mediastinal contours are within normal limits. Both lungs are clear. The visualized skeletal structures are unremarkable. IMPRESSION: No active cardiopulmonary disease. Electronically Signed   By: Inez Catalina M.D.   On: 03/01/2016 21:59   Ct Head Wo Contrast  Result Date: 03/01/2016 CLINICAL DATA:  Headache 2 weeks with dizziness today. EXAM: CT HEAD WITHOUT CONTRAST TECHNIQUE: Contiguous axial images were obtained from the base of the skull through the vertex without intravenous contrast. COMPARISON:  None. FINDINGS: Ventricles, cisterns and other CSF spaces are within normal. There is mild chronic ischemic microvascular disease. There is no mass, mass effect, shift of midline structures or acute hemorrhage. No evidence of acute infarction. Remaining bones and soft tissues are within normal. IMPRESSION: No acute intracranial findings. Mild chronic ischemic  microvascular disease. Electronically Signed   By: Marin Olp M.D.   On: 03/01/2016 15:19   Mr Brain Wo Contrast  Result Date: 03/02/2016 CLINICAL DATA:  Vertigo EXAM: MRI HEAD WITHOUT CONTRAST MRA HEAD WITHOUT CONTRAST TECHNIQUE: Multiplanar, multiecho pulse sequences of the brain and surrounding structures were obtained without intravenous contrast. Angiographic images of the head were obtained using MRA technique without contrast. COMPARISON:  CT head 03/01/2016 FINDINGS: MRI HEAD FINDINGS Generalized atrophy. Ventricular enlargement consistent with atrophy. Negative for acute infarct. Mild chronic white matter changes bilaterally. Mild chronic changes in the basilar. These are most consistent with chronic microvascular ischemia. Negative for intracranial hemorrhage or fluid collection Negative for mass or edema. Mild mucosal edema paranasal sinuses. Bilateral lens replacement. No orbital mass. Image quality degraded by motion. MRA HEAD FINDINGS Image quality degraded by motion. Both vertebral arteries patent to the basilar. Fetal origin right posterior cerebral artery. Mild stenosis mid right posterior cerebral artery. Left posterior cerebral artery patent with mild stenosis distally. Superior cerebellar arteries patent bilaterally. PICA patent bilaterally. Irregularity of the cavernous carotid artery bilaterally with decreased signal bilaterally. This could be due to stenosis or artifact due to tortuosity. Anterior and middle cerebral arteries patent bilaterally without significant stenosis. Hypoplastic right A1 segment. Both anterior cerebral arteries supplied from the left. Negative for vascular malformation or aneurysm. IMPRESSION: Image quality degraded by motion Negative for acute infarct Atrophy with mild chronic microvascular ischemia Bilateral posterior cerebral artery stenoses. Probable stenosis of the cavernous carotid bilaterally, difficult to quantitate due to tortuosity and motion.  Electronically Signed   By: Franchot Gallo M.D.   On: 03/02/2016 11:02   Mr Jodene Nam Head/brain PI Cm  Result Date: 03/02/2016 CLINICAL DATA:  Vertigo EXAM: MRI HEAD WITHOUT CONTRAST MRA HEAD WITHOUT CONTRAST TECHNIQUE: Multiplanar, multiecho pulse sequences of the brain and surrounding structures were obtained without intravenous contrast. Angiographic images of the head were obtained using MRA technique without contrast. COMPARISON:  CT head 03/01/2016 FINDINGS: MRI HEAD FINDINGS Generalized atrophy. Ventricular enlargement consistent with atrophy. Negative for acute infarct. Mild chronic white matter changes bilaterally. Mild chronic changes in the basilar. These  are most consistent with chronic microvascular ischemia. Negative for intracranial hemorrhage or fluid collection Negative for mass or edema. Mild mucosal edema paranasal sinuses. Bilateral lens replacement. No orbital mass. Image quality degraded by motion. MRA HEAD FINDINGS Image quality degraded by motion. Both vertebral arteries patent to the basilar. Fetal origin right posterior cerebral artery. Mild stenosis mid right posterior cerebral artery. Left posterior cerebral artery patent with mild stenosis distally. Superior cerebellar arteries patent bilaterally. PICA patent bilaterally. Irregularity of the cavernous carotid artery bilaterally with decreased signal bilaterally. This could be due to stenosis or artifact due to tortuosity. Anterior and middle cerebral arteries patent bilaterally without significant stenosis. Hypoplastic right A1 segment. Both anterior cerebral arteries supplied from the left. Negative for vascular malformation or aneurysm. IMPRESSION: Image quality degraded by motion Negative for acute infarct Atrophy with mild chronic microvascular ischemia Bilateral posterior cerebral artery stenoses. Probable stenosis of the cavernous carotid bilaterally, difficult to quantitate due to tortuosity and motion. Electronically Signed   By:  Franchot Gallo M.D.   On: 03/02/2016 11:02       Assessment & Plan:   Problem List Items Addressed This Visit    Depression    Followed by psychiatry.  Stable.        Essential hypertension, benign    Blood pressure doing better on lisinopril.  Recheck improved.  Follow.        History of frequent urinary tract infections - Primary    Seeing urology.  Will recheck today to confirm no infection.        Relevant Orders   Urinalysis, Routine w reflex microscopic (Completed)   Urine Culture (Completed)   Hypercholesterolemia    On crestor.  Low cholesterol diet and exercise.  Follow lipid panel and liver function tests.        Hyperglycemia    Low carb diet and exercise.  Follow met b and a1c.        Pulmonary hypertension ([place])    Has seen cardiology and pulmonary.            Einar Pheasant, MD

## 2017-02-12 DIAGNOSIS — M19011 Primary osteoarthritis, right shoulder: Secondary | ICD-10-CM | POA: Diagnosis not present

## 2017-02-12 DIAGNOSIS — M19012 Primary osteoarthritis, left shoulder: Secondary | ICD-10-CM | POA: Diagnosis not present

## 2017-03-04 DIAGNOSIS — F330 Major depressive disorder, recurrent, mild: Secondary | ICD-10-CM | POA: Diagnosis not present

## 2017-03-04 DIAGNOSIS — F0280 Dementia in other diseases classified elsewhere without behavioral disturbance: Secondary | ICD-10-CM | POA: Diagnosis not present

## 2017-03-05 ENCOUNTER — Telehealth: Payer: Self-pay | Admitting: Internal Medicine

## 2017-03-05 NOTE — Telephone Encounter (Signed)
-----   Message from Chauncey Mann, MD sent at 03/05/2017  8:57 AM EDT ----- I have been seeing [name] for several years now. She is stable on Remeron 30mg  po nightly and Zoloft 150mg  po daily. She has a hard time walking due to her vision. I think it is ok if you want to take these meds over to reduce her visits. She is at baseline.  She did have a fall 2 days ago and hit her head but when I saw her there was no new neurological deficits. She did not want to go to the ER after the fall and told me she had not yet told you. I thought you should know. It seems like her vision problems caused the fall.

## 2017-03-05 NOTE — Telephone Encounter (Signed)
Patient husband declined  letting speak to  patient .stating " she cannot remember nothing , you must speak with me". Patient husband stated she was fine from the fall that they cannot call every time she falls, stated patient has no injury from fall. Advised patient DPR ( husband ) to call office if any problems, or if patient  Has any more falls . Patient scheduled for  Follow up 04/24/17 with PCP.

## 2017-03-05 NOTE — Telephone Encounter (Signed)
Received message from Dr Nicolasa Ducking that pt had fallen a couple of days prior to her visit with Dr Nicolasa Ducking.  Note reviewed.  States doing ok and declined ER evaluation.  Please confirm with pt doing ok.  Thanks

## 2017-03-13 ENCOUNTER — Ambulatory Visit: Payer: Self-pay | Admitting: Urology

## 2017-03-26 ENCOUNTER — Other Ambulatory Visit: Payer: Self-pay | Admitting: Internal Medicine

## 2017-03-26 DIAGNOSIS — Z760 Encounter for issue of repeat prescription: Secondary | ICD-10-CM

## 2017-04-15 ENCOUNTER — Telehealth: Payer: Self-pay | Admitting: *Deleted

## 2017-04-15 NOTE — Telephone Encounter (Signed)
FYI Patients husband cancelled all future appt with Dr.Scott, patient is now a resident pf [facility] ,he stated they have providers there

## 2017-04-15 NOTE — Telephone Encounter (Signed)
FYI do I need to do anything further with this.

## 2017-04-21 ENCOUNTER — Ambulatory Visit: Payer: Self-pay | Admitting: Internal Medicine

## 2017-04-24 ENCOUNTER — Ambulatory Visit: Payer: Self-pay | Admitting: Internal Medicine

## 2017-05-27 ENCOUNTER — Ambulatory Visit: Payer: Self-pay | Admitting: Internal Medicine

## 2017-06-26 ENCOUNTER — Telehealth: Payer: Self-pay | Admitting: Internal Medicine

## 2017-06-26 NOTE — Telephone Encounter (Signed)
Pt not longer Dr. Nicki Reaper pt. She has Medicaid and is part of the [employer] [facility].   Removed Dr. Nicki Reaper as PCP in [place]

## 2017-07-22 ENCOUNTER — Other Ambulatory Visit: Payer: Self-pay | Admitting: Family Medicine

## 2017-07-22 DIAGNOSIS — Z9181 History of falling: Secondary | ICD-10-CM

## 2017-08-25 ENCOUNTER — Ambulatory Visit: Payer: Self-pay

## 2017-08-27 ENCOUNTER — Other Ambulatory Visit: Payer: Self-pay

## 2017-10-01 ENCOUNTER — Ambulatory Visit
Admission: RE | Admit: 2017-10-01 | Discharge: 2017-10-01 | Disposition: A | Discharge status: Home / Self Care | Payer: Medicare (Managed Care) | Source: Ambulatory Visit | Attending: Family Medicine | Admitting: Family Medicine

## 2017-10-01 DIAGNOSIS — M810 Age-related osteoporosis without current pathological fracture: Secondary | ICD-10-CM | POA: Diagnosis not present

## 2017-10-01 DIAGNOSIS — Z9181 History of falling: Secondary | ICD-10-CM | POA: Diagnosis present

## 2018-05-10 ENCOUNTER — Emergency Department
Admission: EM | Admit: 2018-05-10 | Discharge: 2018-05-10 | Disposition: A | Discharge status: Home / Self Care | Payer: Medicare (Managed Care) | Attending: Emergency Medicine | Admitting: Emergency Medicine

## 2018-05-10 ENCOUNTER — Other Ambulatory Visit: Payer: Self-pay

## 2018-05-10 ENCOUNTER — Emergency Department: Payer: Medicare (Managed Care)

## 2018-05-10 DIAGNOSIS — Y939 Activity, unspecified: Secondary | ICD-10-CM | POA: Diagnosis not present

## 2018-05-10 DIAGNOSIS — I10 Essential (primary) hypertension: Secondary | ICD-10-CM | POA: Insufficient documentation

## 2018-05-10 DIAGNOSIS — R51 Headache: Secondary | ICD-10-CM | POA: Diagnosis not present

## 2018-05-10 DIAGNOSIS — Z79899 Other long term (current) drug therapy: Secondary | ICD-10-CM | POA: Diagnosis not present

## 2018-05-10 DIAGNOSIS — Y998 Other external cause status: Secondary | ICD-10-CM | POA: Diagnosis not present

## 2018-05-10 DIAGNOSIS — E7800 Pure hypercholesterolemia, unspecified: Secondary | ICD-10-CM | POA: Diagnosis not present

## 2018-05-10 DIAGNOSIS — Z8541 Personal history of malignant neoplasm of cervix uteri: Secondary | ICD-10-CM | POA: Diagnosis not present

## 2018-05-10 DIAGNOSIS — W010XXA Fall on same level from slipping, tripping and stumbling without subsequent striking against object, initial encounter: Secondary | ICD-10-CM | POA: Insufficient documentation

## 2018-05-10 DIAGNOSIS — S0003XA Contusion of scalp, initial encounter: Secondary | ICD-10-CM | POA: Diagnosis not present

## 2018-05-10 DIAGNOSIS — F0390 Unspecified dementia without behavioral disturbance: Secondary | ICD-10-CM | POA: Diagnosis not present

## 2018-05-10 DIAGNOSIS — W19XXXA Unspecified fall, initial encounter: Secondary | ICD-10-CM

## 2018-05-10 DIAGNOSIS — S0990XA Unspecified injury of head, initial encounter: Secondary | ICD-10-CM | POA: Diagnosis present

## 2018-05-10 DIAGNOSIS — Y929 Unspecified place or not applicable: Secondary | ICD-10-CM | POA: Diagnosis not present

## 2018-05-10 NOTE — ED Provider Notes (Signed)
[facility] Emergency Department Provider Note  ____________________________________________   I have reviewed the triage vital signs and the nursing notes.   HISTORY  Chief Complaint Fall; Head Injury; and Neck Injury   History limited by: Not Limited   HPI [name] is a 84 y.o. female who presents to the emergency department today after a fall. Patient states that she fell backwards. Has a history of falls. Hit her head against furniture. Did not lose consciousness. Complaining of headache and neck pain. Also complaining of left shoulder pain. States she injured that shoulder a while ago but is concerned she re injured it. She denies any recent illness.    Per medical record review patient has a history of shoulder and neck pain.  Past Medical History:  Diagnosis Date  . Anxiety   . Arthritis   . Cancer [facility])    uterine and questionable vulvar, s/p hysterectomy  . Chronic headaches   . Cystocele   . Dementia    able to sign own papers  . Depression   . GERD (gastroesophageal reflux disease)   . Glaucoma   . Hypercholesterolemia   . Hypertension   . Macular degeneration   . Reactive airway disease   . Rectocele   . Right bundle branch block   . Sleep apnea   . Uterine cancer ([place])   . Vertigo    none - several yrs  . Wears dentures    full upper and lower    Patient Active Problem List   Diagnosis Date Noted  . Shoulder pain 05/31/2016  . Hyperlipidemia 03/02/2016  . GERD (gastroesophageal reflux disease) 03/02/2016  . Dementia 03/02/2016  . Vertigo 03/01/2016  . Frequent falls 12/04/2014  . Hyperglycemia 12/04/2014  . Health care maintenance 12/04/2014  . URI (upper respiratory infection) 10/24/2014  . Hip pain 10/09/2014  . Pain of left thumb 05/31/2014  . Cough 03/14/2014  . Back pain 01/30/2014  . Joint pain 01/30/2014  . Elevated alkaline phosphatase level 01/30/2014  . Neck pain 12/12/2013  . Skin lesion of breast  12/12/2013  . Loss of vision 10/11/2013  . Vision loss, left eye 10/11/2013  . Anxiety 10/11/2013  . Chest pain on exertion 10/11/2013  . DOE (dyspnea on exertion) 10/11/2013  . Headache 02/28/2013  . History of frequent urinary tract infections 02/01/2013  . Difficulty sleeping 02/01/2013  . Hypercholesterolemia 02/01/2013  . Pulmonary hypertension ([place]) 02/01/2013  . Depression 02/01/2013  . Dizziness 09/10/2012  . Essential hypertension, benign 09/10/2012    Past Surgical History:  Procedure Laterality Date  . ABDOMINAL HYSTERECTOMY    . ABDOMINAL HYSTERECTOMY    . APPENDECTOMY    . CHOLECYSTECTOMY    . IMAGE GUIDED SINUS SURGERY N/A 06/20/2015   Procedure: IMAGE GUIDED SINUS SURGERY;  Surgeon: Clyde Canterbury, MD;  Location: [place];  Service: ENT;  Laterality: N/A;  GAVE DISK TO CE CE  . REPLACEMENT TOTAL KNEE Left   . SPHENOIDECTOMY Bilateral 06/20/2015   Procedure: SPHENOIDECTOMY;  Surgeon: Clyde Canterbury, MD;  Location: [place];  Service: ENT;  Laterality: Bilateral;  . uterine cancer    . VULVECTOMY      Prior to Admission medications   Medication Sig Start Date End Date Taking? Authorizing Provider  amoxicillin-clavulanate (AUGMENTIN) 500-125 MG tablet Take 1 tablet (500 mg total) by mouth 2 (two) times daily. 11/13/16   Einar Pheasant, MD  Aspirin-Salicylamide-Caffeine (BC HEADACHE POWDER PO) Take 1 packet by mouth daily as  needed (pain/headache).    [provider]  atorvastatin (LIPITOR) 40 MG tablet TAKE 1 TABLET AT 6 PM 01/27/17   Einar Pheasant, MD  brimonidine (ALPHAGAN P) 0.1 % SOLN Place 1 drop into both eyes 2 (two) times daily.    [provider]  brinzolamide (AZOPT) 1 % ophthalmic suspension Place 1 drop into the left eye 3 (three) times daily. 10/13/13   Dellinger, Bobby Rumpf, PA-C  fluticasone (FLONASE) 50 MCG/ACT nasal spray Place 2 sprays into both nostrils daily. Patient taking differently: Place 2 sprays into both  nostrils daily as needed (seasonal allergies).  03/10/14   Einar Pheasant, MD  hydrochlorothiazide (MICROZIDE) 12.5 MG capsule Take 1 capsule (12.5 mg total) by mouth daily. 03/04/16   Rai, Ripudeep K, MD  latanoprost (XALATAN) 0.005 % ophthalmic solution Place 1 drop into both eyes at bedtime. 10/13/13   Dellinger, Bobby Rumpf, PA-C  lisinopril (PRINIVIL,ZESTRIL) 10 MG tablet Take 1 tablet (10 mg total) by mouth daily. 01/27/17   Einar Pheasant, MD  meclizine (ANTIVERT) 25 MG tablet Take 1 tablet (25 mg total) by mouth 3 (three) times daily as needed for dizziness. 03/04/16   Rai, Ripudeep K, MD  memantine (NAMENDA) 10 MG tablet Take 10 mg by mouth 2 (two) times daily.     Chauncey Mann, MD  mirtazapine (REMERON) 30 MG tablet Take 30 mg by mouth at bedtime.    [provider]  Multiple Vitamin (MULTIVITAMIN WITH MINERALS) TABS tablet Take 1 tablet by mouth daily.    [provider]  Multiple Vitamins-Minerals (PRESERVISION AREDS) TABS Take 1 tablet by mouth daily.     [provider]  mupirocin ointment (BACTROBAN) 2 % Apply to affected area on chest and scalp bid x 1 week. 10/18/16   Einar Pheasant, MD  nitrofurantoin, macrocrystal-monohydrate, (MACROBID) 100 MG capsule Take 1 capsule (100 mg total) by mouth every 12 (twelve) hours. 12/10/16   Zara Council A, PA-C  Omega-3 Fatty Acids (FISH OIL CONCENTRATE PO) Take 1 capsule by mouth daily.     [provider]  omeprazole (PRILOSEC) 20 MG capsule Take 1 capsule (20 mg total) by mouth every morning. 03/26/17   Einar Pheasant, MD  oxybutynin (OXYTROL) 3.9 MG/24HR Place 1 patch onto the skin every 3 (three) days. 03/04/16   Rai, Ripudeep K, MD  rosuvastatin (CRESTOR) 20 MG tablet Take 1 tablet (20 mg total) by mouth at bedtime. 03/04/16   Rai, Vernelle Emerald, MD  sertraline (ZOLOFT) 100 MG tablet Take 150 mg by mouth daily.     Lenor Coffin, PA-C  SERTRALINE HCL PO Take 100 mg by mouth daily.    [provider]   travoprost, benzalkonium, (TRAVATAN) 0.004 % ophthalmic solution Place 1 drop into both eyes at bedtime.    [provider]  vitamin D, CHOLECALCIFEROL, 400 UNITS tablet Take 400 Units by mouth daily.    [provider]    Allergies Ambien [zolpidem tartrate] and Codeine  Family History  Problem Relation Age of Onset  . Heart disease Mother   . Cancer Father        leukemia  . Heart disease Brother        x2  . Cancer Daughter        breast  . Kidney cancer Neg Hx   . Bladder Cancer Neg Hx     Social History Social History   Tobacco Use  . Smoking status: Never Smoker  . Smokeless tobacco: Never Used  Substance  Use Topics  . Alcohol use: No    Alcohol/week: 0.0 oz  . Drug use: No    Review of Systems Constitutional: No fever/chills Eyes: States she is legally blind. ENT: No sore throat. Cardiovascular: Denies chest pain. Respiratory: Denies shortness of breath. Gastrointestinal: No abdominal pain.  No nausea, no vomiting.  No diarrhea.   Genitourinary: Negative for dysuria. Musculoskeletal: Positive for left shoulder and neck pain. Skin: Negative for rash. Neurological: Positive for headache.  ____________________________________________   PHYSICAL EXAM:  VITAL SIGNS: ED Triage Vitals  Enc Vitals Group     BP 05/10/18 1614 (!) 158/70     Pulse Rate 05/10/18 1614 73     Resp 05/10/18 1614 18     Temp 05/10/18 1614 98 F (36.7 C)     Temp Source 05/10/18 1614 Oral     SpO2 05/10/18 1614 94 %     Weight 05/10/18 1615 156 lb (70.8 kg)     Height 05/10/18 1615 5\' 4"  (1.626 m)     Head Circumference --      Peak Flow --      Pain Score 05/10/18 1614 8   Constitutional: Alert and oriented.  Eyes: Conjunctivae are normal.  ENT      Head: Normocephalic and atraumatic.      Nose: No congestion/rhinnorhea.      Mouth/Throat: Mucous membranes are moist.      Neck: C-collar.  Hematological/Lymphatic/Immunilogical: No cervical  lymphadenopathy. Cardiovascular: Normal rate, regular rhythm.  No murmurs, rubs, or gallops.  Respiratory: Normal respiratory effort without tachypnea nor retractions. Breath sounds are clear and equal bilaterally. No wheezes/rales/rhonchi. Gastrointestinal: Soft and non tender. No rebound. No guarding.  Genitourinary: Deferred Musculoskeletal: Left shoulder tender to palpation. Neurologic:  Normal speech and language. No gross focal neurologic deficits are appreciated.  Skin:  Skin is warm, dry and intact. No rash noted. Psychiatric: Mood and affect are normal. Speech and behavior are normal. Patient exhibits appropriate insight and judgment.  ____________________________________________    LABS (pertinent positives/negatives)  None  ____________________________________________   EKG  None  ____________________________________________    RADIOLOGY  CT head/cervical spine No acute osseous injury. No intracranial bleed.   ____________________________________________   PROCEDURES  Procedures  ____________________________________________   INITIAL IMPRESSION / ASSESSMENT AND PLAN / ED COURSE  Pertinent labs & imaging results that were available during my care of the patient were reviewed by me and considered in my medical decision making (see chart for details).  Patient presented to the emergency department today after a fall.  Did hit her head.  Head CT and cervical spine without any acute osseous injury.  No intracranial bleed.  The patient initially had been ordered a left shoulder x-ray however when I went to discuss the results of the cervical spine and head CT with the patient she stated that her left shoulder felt better.  She was able to move it without complaints.  She did not feel x-ray was necessary.   ____________________________________________   FINAL CLINICAL IMPRESSION(S) / ED DIAGNOSES  Final diagnoses:  Fall, initial encounter  Contusion of  scalp, initial encounter     Note: This dictation was prepared with Diplomatic Services operational officer dictation. Any transcriptional errors that result from this process are unintentional     Nance Pear, MD 05/10/18 [phone]

## 2018-05-10 NOTE — ED Triage Notes (Addendum)
Pt states that she was reaching for something and fell backwards, pt reports that she thinks she hit the dryer, pt has a hematoma to the back of her head, pt also c/o left shoulder pain and is tender with palpation to the back of her neck, c-collar applied at arrival to the ER. No loc, family witnessed fall. Report also received from [place] [address] who reports pt has hx of fequent falls and dementia

## 2018-05-10 NOTE — Discharge Instructions (Addendum)
Please seek medical attention for any high fevers, chest pain, shortness of breath, change in behavior, persistent vomiting, bloody stool or any other new or concerning symptoms.  

## 2018-07-02 ENCOUNTER — Other Ambulatory Visit: Payer: Self-pay | Admitting: Family

## 2018-07-02 ENCOUNTER — Ambulatory Visit
Admission: RE | Admit: 2018-07-02 | Discharge: 2018-07-02 | Disposition: A | Discharge status: Home / Self Care | Payer: Medicare (Managed Care) | Source: Ambulatory Visit | Attending: Family | Admitting: Family

## 2018-07-02 DIAGNOSIS — R296 Repeated falls: Secondary | ICD-10-CM | POA: Insufficient documentation

## 2018-07-02 DIAGNOSIS — R4182 Altered mental status, unspecified: Secondary | ICD-10-CM

## 2018-07-02 LAB — POCT I-STAT CREATININE: Creatinine, Ser: 0.9 mg/dL (ref 0.44–1.00)

## 2018-07-02 MED ORDER — IOPAMIDOL (ISOVUE-300) INJECTION 61%
75.0000 mL | Freq: Once | INTRAVENOUS | Status: AC | PRN
  Administered 2018-07-02: 75 mL via INTRAVENOUS

## 2018-07-14 ENCOUNTER — Ambulatory Visit: Payer: Medicare (Managed Care) | Attending: Nurse Practitioner | Admitting: Nurse Practitioner

## 2018-07-14 ENCOUNTER — Ambulatory Visit
Admission: RE | Admit: 2018-07-14 | Discharge: 2018-07-14 | Disposition: A | Discharge status: Home / Self Care | Payer: Medicare (Managed Care) | Source: Ambulatory Visit | Attending: Nurse Practitioner | Admitting: Nurse Practitioner

## 2018-07-14 ENCOUNTER — Encounter: Payer: Self-pay | Admitting: Nurse Practitioner

## 2018-07-14 ENCOUNTER — Other Ambulatory Visit: Payer: Self-pay

## 2018-07-14 DIAGNOSIS — E785 Hyperlipidemia, unspecified: Secondary | ICD-10-CM | POA: Insufficient documentation

## 2018-07-14 DIAGNOSIS — M25551 Pain in right hip: Secondary | ICD-10-CM

## 2018-07-14 DIAGNOSIS — F419 Anxiety disorder, unspecified: Secondary | ICD-10-CM | POA: Insufficient documentation

## 2018-07-14 DIAGNOSIS — G8929 Other chronic pain: Secondary | ICD-10-CM | POA: Insufficient documentation

## 2018-07-14 DIAGNOSIS — M25561 Pain in right knee: Secondary | ICD-10-CM

## 2018-07-14 DIAGNOSIS — M160 Bilateral primary osteoarthritis of hip: Secondary | ICD-10-CM | POA: Diagnosis not present

## 2018-07-14 DIAGNOSIS — M19011 Primary osteoarthritis, right shoulder: Secondary | ICD-10-CM | POA: Insufficient documentation

## 2018-07-14 DIAGNOSIS — M47812 Spondylosis without myelopathy or radiculopathy, cervical region: Secondary | ICD-10-CM | POA: Insufficient documentation

## 2018-07-14 DIAGNOSIS — Z789 Other specified health status: Secondary | ICD-10-CM | POA: Diagnosis not present

## 2018-07-14 DIAGNOSIS — M25511 Pain in right shoulder: Secondary | ICD-10-CM

## 2018-07-14 DIAGNOSIS — M25512 Pain in left shoulder: Secondary | ICD-10-CM | POA: Insufficient documentation

## 2018-07-14 DIAGNOSIS — M19012 Primary osteoarthritis, left shoulder: Secondary | ICD-10-CM | POA: Insufficient documentation

## 2018-07-14 DIAGNOSIS — I4510 Unspecified right bundle-branch block: Secondary | ICD-10-CM | POA: Insufficient documentation

## 2018-07-14 DIAGNOSIS — M25552 Pain in left hip: Secondary | ICD-10-CM

## 2018-07-14 DIAGNOSIS — N3281 Overactive bladder: Secondary | ICD-10-CM | POA: Insufficient documentation

## 2018-07-14 DIAGNOSIS — N8110 Cystocele, unspecified: Secondary | ICD-10-CM | POA: Insufficient documentation

## 2018-07-14 DIAGNOSIS — M1711 Unilateral primary osteoarthritis, right knee: Secondary | ICD-10-CM | POA: Insufficient documentation

## 2018-07-14 DIAGNOSIS — F329 Major depressive disorder, single episode, unspecified: Secondary | ICD-10-CM | POA: Insufficient documentation

## 2018-07-14 DIAGNOSIS — G894 Chronic pain syndrome: Secondary | ICD-10-CM | POA: Diagnosis not present

## 2018-07-14 DIAGNOSIS — E7800 Pure hypercholesterolemia, unspecified: Secondary | ICD-10-CM | POA: Insufficient documentation

## 2018-07-14 DIAGNOSIS — M899 Disorder of bone, unspecified: Secondary | ICD-10-CM

## 2018-07-14 DIAGNOSIS — F0390 Unspecified dementia without behavioral disturbance: Secondary | ICD-10-CM | POA: Insufficient documentation

## 2018-07-14 DIAGNOSIS — M4802 Spinal stenosis, cervical region: Secondary | ICD-10-CM | POA: Insufficient documentation

## 2018-07-14 DIAGNOSIS — Z79899 Other long term (current) drug therapy: Secondary | ICD-10-CM | POA: Diagnosis not present

## 2018-07-14 DIAGNOSIS — M4312 Spondylolisthesis, cervical region: Secondary | ICD-10-CM | POA: Insufficient documentation

## 2018-07-14 DIAGNOSIS — I700 Atherosclerosis of aorta: Secondary | ICD-10-CM | POA: Insufficient documentation

## 2018-07-14 DIAGNOSIS — E041 Nontoxic single thyroid nodule: Secondary | ICD-10-CM | POA: Insufficient documentation

## 2018-07-14 DIAGNOSIS — K219 Gastro-esophageal reflux disease without esophagitis: Secondary | ICD-10-CM | POA: Insufficient documentation

## 2018-07-14 DIAGNOSIS — Z96651 Presence of right artificial knee joint: Secondary | ICD-10-CM | POA: Insufficient documentation

## 2018-07-14 DIAGNOSIS — G479 Sleep disorder, unspecified: Secondary | ICD-10-CM | POA: Insufficient documentation

## 2018-07-14 DIAGNOSIS — I10 Essential (primary) hypertension: Secondary | ICD-10-CM | POA: Insufficient documentation

## 2018-07-14 NOTE — Patient Instructions (Addendum)
____________________________________________________________________________________________  Appointment Policy Summary  It is our goal and responsibility to provide the medical community with assistance in the evaluation and management of patients with chronic pain. Unfortunately our resources are limited. Because we do not have an unlimited amount of time, or available appointments, we are required to closely monitor and manage their use. The following rules exist to maximize their use:  Patient's responsibilities: 1. Punctuality:  At what time should I arrive? You should be physically present in our office 30 minutes before your scheduled appointment. Your scheduled appointment is with your assigned healthcare provider. However, it takes 5-10 minutes to be "checked-in", and another 15 minutes for the nurses to do the admission. If you arrive to our office at the time you were given for your appointment, you will end up being at least 20-25 minutes late to your appointment with the provider. 2. Tardiness:  What happens if I arrive only a few minutes after my scheduled appointment time? You will need to reschedule your appointment. The cutoff is your appointment time. This is why it is so important that you arrive at least 30 minutes before that appointment. If you have an appointment scheduled for 10:00 AM and you arrive at 10:01, you will be required to reschedule your appointment.  3. Plan ahead:  Always assume that you will encounter traffic on your way in. Plan for it. If you are dependent on a driver, make sure they understand these rules and the need to arrive early. 4. Other appointments and responsibilities:  Avoid scheduling any other appointments before or after your pain clinic appointments.  5. Be prepared:  Write down everything that you need to discuss with your healthcare provider and give this information to the admitting nurse. Write down the medications that you will need  refilled. Bring your pills and bottles (even the empty ones), to all of your appointments, except for those where a procedure is scheduled. 6. No children or pets:  Find someone to take care of them. It is not appropriate to bring them in. 7. Scheduling changes:  We request "advanced notification" of any changes or cancellations. 8. Advanced notification:  Defined as a time period of more than 24 hours prior to the originally scheduled appointment. This allows for the appointment to be offered to other patients. 9. Rescheduling:  When a visit is rescheduled, it will require the cancellation of the original appointment. For this reason they both fall within the category of "Cancellations".  10. Cancellations:  They require advanced notification. Any cancellation less than 24 hours before the  appointment will be recorded as a "No Show". 11. No Show:  Defined as an unkept appointment where the patient failed to notify or declare to the practice their intention or inability to keep the appointment.  Corrective process for repeat offenders:  1. Tardiness: Three (3) episodes of rescheduling due to late arrivals will be recorded as one (1) "No Show". 2. Cancellation or reschedule: Three (3) cancellations or rescheduling will be recorded as one (1) "No Show". 3. "No Shows": Three (3) "No Shows" within a 12 month period will result in discharge from the practice. ____________________________________________________________________________________________  ____________________________________________________________________________________________  Pain Scale  Introduction: The pain score used by this practice is the Verbal Numerical Rating Scale (VNRS-11). This is an 11-point scale. It is for adults and children 10 years or older. There are significant differences in how the pain score is reported, used, and applied. Forget everything you learned in the past and learn  this scoring system.  General  Information: The scale should reflect your current level of pain. Unless you are specifically asked for the level of your worst pain, or your average pain. If you are asked for one of these two, then it should be understood that it is over the past 24 hours.  Basic Activities of Daily Living (ADL): Personal hygiene, dressing, eating, transferring, and using restroom.  Instructions: Most patients tend to report their level of pain as a combination of two factors, their physical pain and their psychosocial pain. This last one is also known as "suffering" and it is reflection of how physical pain affects you socially and psychologically. From now on, report them separately. From this point on, when asked to report your pain level, report only your physical pain. Use the following table for reference.  Pain Clinic Pain Levels (0-5/10)  Pain Level Score  Description  No Pain 0   Mild pain 1 Nagging, annoying, but does not interfere with basic activities of daily living (ADL). Patients are able to eat, bathe, get dressed, toileting (being able to get on and off the toilet and perform personal hygiene functions), transfer (move in and out of bed or a chair without assistance), and maintain continence (able to control bladder and bowel functions). Blood pressure and heart rate are unaffected. A normal heart rate for a healthy adult ranges from 60 to 100 bpm (beats per minute).   Mild to moderate pain 2 Noticeable and distracting. Impossible to hide from other people. More frequent flare-ups. Still possible to adapt and function close to normal. It can be very annoying and may have occasional stronger flare-ups. With discipline, patients may get used to it and adapt.   Moderate pain 3 Interferes significantly with activities of daily living (ADL). It becomes difficult to feed, bathe, get dressed, get on and off the toilet or to perform personal hygiene functions. Difficult to get in and out of bed or a chair  without assistance. Very distracting. With effort, it can be ignored when deeply involved in activities.   Moderately severe pain 4 Impossible to ignore for more than a few minutes. With effort, patients may still be able to manage work or participate in some social activities. Very difficult to concentrate. Signs of autonomic nervous system discharge are evident: dilated pupils (mydriasis); mild sweating (diaphoresis); sleep interference. Heart rate becomes elevated (>115 bpm). Diastolic blood pressure (lower number) rises above 100 mmHg. Patients find relief in laying down and not moving.   Severe pain 5 Intense and extremely unpleasant. Associated with frowning face and frequent crying. Pain overwhelms the senses.  Ability to do any activity or maintain social relationships becomes significantly limited. Conversation becomes difficult. Pacing back and forth is common, as getting into a comfortable position is nearly impossible. Pain wakes you up from deep sleep. Physical signs will be obvious: pupillary dilation; increased sweating; goosebumps; brisk reflexes; cold, clammy hands and feet; nausea, vomiting or dry heaves; loss of appetite; significant sleep disturbance with inability to fall asleep or to remain asleep. When persistent, significant weight loss is observed due to the complete loss of appetite and sleep deprivation.  Blood pressure and heart rate becomes significantly elevated. Caution: If elevated blood pressure triggers a pounding headache associated with blurred vision, then the patient should immediately seek attention at an urgent or emergency care unit, as these may be signs of an impending stroke.    Emergency Department Pain Levels (6-10/10)  Emergency Room Pain 6 Severely   limiting. Requires emergency care and should not be seen or managed at an outpatient pain management facility. Communication becomes difficult and requires great effort. Assistance to reach the emergency department  may be required. Facial flushing and profuse sweating along with potentially dangerous increases in heart rate and blood pressure will be evident.   Distressing pain 7 Self-care is very difficult. Assistance is required to transport, or use restroom. Assistance to reach the emergency department will be required. Tasks requiring coordination, such as bathing and getting dressed become very difficult.   Disabling pain 8 Self-care is no longer possible. At this level, pain is disabling. The individual is unable to do even the most "basic" activities such as walking, eating, bathing, dressing, transferring to a bed, or toileting. Fine motor skills are lost. It is difficult to think clearly.   Incapacitating pain 9 Pain becomes incapacitating. Thought processing is no longer possible. Difficult to remember your own name. Control of movement and coordination are lost.   The worst pain imaginable 10 At this level, most patients pass out from pain. When this level is reached, collapse of the autonomic nervous system occurs, leading to a sudden drop in blood pressure and heart rate. This in turn results in a temporary and dramatic drop in blood flow to the brain, leading to a loss of consciousness. Fainting is one of the body's self defense mechanisms. Passing out puts the brain in a calmed state and causes it to shut down for a while, in order to begin the healing process.    Summary: 1. Refer to this scale when providing [place] with your pain level. 2. Be accurate and careful when reporting your pain level. This will help with your care. 3. Over-reporting your pain level will lead to loss of credibility. 4. Even a level of 1/10 means that there is pain and will be treated at our facility. 5. High, inaccurate reporting will be documented as "Symptom Exaggeration", leading to loss of credibility and suspicions of possible secondary gains such as obtaining more narcotics, or wanting to appear disabled, for  fraudulent reasons. 6. Only pain levels of 5 or below will be seen at our facility. 7. Pain levels of 6 and above will be sent to the Emergency Department and the appointment cancelled. ____________________________________________________________________________________________    BMI Assessment: Estimated body mass index is 27.81 kg/m as calculated from the following:   Height as of this encounter: 5\' 4"  (1.626 m).   Weight as of this encounter: 162 lb (73.5 kg).  BMI interpretation table: BMI level Category Range association with higher incidence of chronic pain  <18 kg/m2 Underweight   18.5-24.9 kg/m2 Ideal body weight   25-29.9 kg/m2 Overweight Increased incidence by 20%  30-34.9 kg/m2 Obese (Class I) Increased incidence by 68%  35-39.9 kg/m2 Severe obesity (Class II) Increased incidence by 136%  >40 kg/m2 Extreme obesity (Class III) Increased incidence by 254%   BMI Readings from Last 4 Encounters:  07/14/18 27.81 kg/m  05/10/18 26.78 kg/m  01/23/17 29.65 kg/m  12/10/16 29.77 kg/m   Wt Readings from Last 4 Encounters:  07/14/18 162 lb (73.5 kg)  05/10/18 156 lb (70.8 kg)  01/23/17 167 lb 6.4 oz (75.9 kg)  12/10/16 165 lb 6.4 oz (75 kg)

## 2018-07-14 NOTE — Progress Notes (Signed)
Patient's Name: [name]  MRN: [id number]  Referring Provider: Gareth Morgan, MD  DOB: 07-28-1926  PCP: [facility] In  DOS: 07/14/2018  Note by: Dionisio David NP  Service setting: Ambulatory outpatient  Specialty: Interventional Pain Management  Location: [facility] (AMB) Pain Management Facility    Patient type: New Patient    Primary Reason(s) for Visit: Initial Patient Evaluation CC: Shoulder Pain (bilateral); Neck Pain (spinal stenosis); and Knee Pain (bilateral)  HPI  Ms. Gierke is a 84 y.o. year old, female patient, who comes today for an initial evaluation. She has Dizziness; Essential hypertension, benign; History of frequent urinary tract infections; Difficulty sleeping; Hypercholesterolemia; Pulmonary hypertension ([place]); Headache; Loss of vision; Vision loss, left eye; Anxiety; Chest pain; DOE (dyspnea on exertion); Neck pain; Skin lesion of breast; Back pain; Joint pain; Elevated alkaline phosphatase level; Cough; Pain of left thumb; Hip pain; URI (upper respiratory infection); Frequent falls; Hyperglycemia; Health care maintenance; Vertigo; Hyperlipidemia; GERD (gastroesophageal reflux disease); Dementia ([place]); Shoulder pain; Abnormal levels of other serum enzymes; Bladder prolapse, female, acquired; Chronic headaches; Disorder of breast; Frequency of micturition; Major depressive disorder, single episode; Mixed stress and urge urinary incontinence; OAB (overactive bladder); Osteoarthritis; Pain of finger of left hand; Rectocele; Repeated falls; Sleep disorder; Urethral prolapse; Vaginal atrophy; Dysuria; Anxiety disorder; Depression with anxiety; Other forms of dyspnea; Essential (primary) hypertension; Dorsalgia; Encounter for general adult medical examination without abnormal findings; Pain in hip; Personal history of urinary infection; Pure hypercholesterolemia; Pain in joint; 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 Her primarily concern today is the Shoulder Pain (bilateral); Neck Pain (spinal stenosis); and Knee Pain (bilateral)  Pain Assessment: Location:   Neck Radiating: shoulders bilateral upper arms Duration: Chronic pain Severity: 10-Worst pain ever/10 (subjective, self-reported pain score)  Note: Reported level is compatible with observation. Clinically the patient looks like a 3/10 A 3/10 is viewed as "Moderate" and described as significantly interfering with activities of daily living (ADL). It becomes difficult to feed, bathe, get dressed, get on and off the toilet or to perform personal hygiene functions. Difficult to get in and out of bed or a chair without assistance. Very distracting. With effort, it can be ignored when deeply involved in activities. Information on the proper use of the pain scale provided to the patient today. When using our objective Pain Scale, levels between 6 and 10/10 are said to belong in an emergency room, as it progressively worsens from a 6/10, described as severely limiting, requiring emergency care not usually available at an outpatient pain management facility. At a 6/10 level, communication becomes difficult and requires great effort. Assistance to reach the emergency department may be required. Facial flushing and profuse sweating along with potentially dangerous increases in heart rate and blood pressure will be evident. Effect on ADL: lift arms,  putting clothes on, standing,  Modifying factors: Tramdol BP: (!) 141/59  HR: 73  Onset and Duration: Gradual and Present longer than 3 months Cause of  pain: Unknown Severity: Getting worse, NAS-11 at its worse: 10/10, NAS-11 at its best: 4/10, NAS-11 now: 10/10 and NAS-11 on the average: 9/10 Timing:  Not influenced by the time of the day, During activity or exercise, After activity or exercise and After a period of immobility Aggravating Factors: Bending, Lifiting, Motion, Prolonged sitting, Prolonged standing, Walking, Walking uphill and Walking downhill Alleviating Factors: Hot packs, Lying down, Medications, Resting and Sleeping Associated Problems: Depression, Dizziness, Fatigue, Inability to concentrate, Inability to control bladder (urine), Inability to control bowel, Personality changes, Sadness, Swelling, Weakness, Pain that wakes patient up and Pain that does not allow patient to sleep Quality of Pain: Aching, Agonizing, Constant, Deep, Disabling, Exhausting, Feeling of constriction, Pressure-like, Sharp, Tender, Tiring and Uncomfortable Previous Examinations or Tests: Bone scan, CT scan, MRI scan, X-rays, Neurological evaluation, Orthopedic evaluation and Psychiatric evaluation Previous Treatments: Trigger point injections  The patient comes into the clinics today for the first time for a chronic pain management evaluation.  According to the patient and her daughter her primary area of pain is in her shoulders.  She admits that the right side is worse than the left.  She admits the pain goes down to her elbows and also into her shoulder blades.  She denies any numbness tingling in her arms.  She denies any previous surgery.  She has had interventional therapy years ago.  She denies any recent images.  She [facility].  Her second area of pain is in her right knee.  He admits that this pain comes and goes.  She states that she is status post right total knee replacement approximately 10 years ago by Dr. Mariel Craft Orthopedist.   Her third area of pain is in her hip the right is worse than  the left. She denies and numbness or tingling. She has weakness and is unable to ambulate.    Today I took the time to provide the patient with information regarding this pain practice. The patient was informed that the practice is divided into two sections: an interventional pain management section, as well as a completely separate and distinct medication management section. I explained that there are procedure days for interventional therapies, and evaluation days for follow-ups and medication management. Because of the amount of documentation required during both, they are kept separated. This means that there is the possibility that she may be scheduled for a procedure on one day, and medication management the next. I have also informed her that because of staffing and facility limitations, this practice will no longer take patients for medication management only. To illustrate the reasons for this, I gave the patient the example of surgeons, and how inappropriate it would be to refer a patient to his/her care, just to write for the post-surgical antibiotics on a surgery done by a different surgeon.   Because interventional pain management is part of the board-certified specialty for the doctors, the patient was informed that joining this practice means that they are open to any and all interventional therapies. I made it clear that this does not mean that they will be forced to have any procedures done. What this means is that I believe interventional therapies to be essential part of the diagnosis and proper management of chronic pain conditions. Therefore, patients not interested in these interventional alternatives will be better served under the care of a different practitioner.  The patient was also made aware of my Comprehensive Pain Management Safety Guidelines where by joining this practice, they limit all of their nerve blocks and joint injections to those done by our practice, for as long as  we  are retained to manage their care. Historic Controlled Substance Pharmacotherapy Review  PMP and historical list of controlled substances: Tramadol 50 mg  Highest opioid analgesic regimen found: Tramadol 50 mg  I tablet twice daily ( 06/29/18) Tramadol 100 mg  Most recent opioid analgesic: Tramadol 50 mg  I tablet twice daily ( 06/29/18) Tramadol 100 mg  Current opioid analgesics: Tramadol 50 mg I tablet twice daily ( 06/29/18) Tramadol 100 mg  Highest recorded MME/day: 10 mg/day MME/day: 10 mg/day Medications: The patient did not bring the medication(s) to the appointment, as requested in our "New Patient Package" Pharmacodynamics: Desired effects: Analgesia: The patient reports >50% benefit. Reported improvement in function: The patient reports medication allows her to accomplish basic ADLs. Clinically meaningful improvement in function (CMIF): Sustained CMIF goals met Perceived effectiveness: Described as relatively effective, allowing for increase in activities of daily living (ADL) Undesirable effects: Side-effects or Adverse reactions: None reported Historical Monitoring: The patient  reports that she does not use drugs. List of all UDS Test(s): No results found for: MDMA, COCAINSCRNUR, PCPSCRNUR, PCPQUANT, CANNABQUANT, THCU, [place] List of all Serum Drug Screening Test(s):  No results found for: AMPHSCRSER, BARBSCRSER, BENZOSCRSER, COCAINSCRSER, PCPSCRSER, PCPQUANT, THCSCRSER, CANNABQUANT, OPIATESCRSER, OXYSCRSER, PROPOXSCRSER Historical Background Evaluation: [place] PDMP: Six (6) year initial data search conducted.             [place] Department of public safety, offender search: Editor, commissioning Information) Non-contributory Risk Assessment Profile: Aberrant behavior: None observed or detected today Risk factors for fatal opioid overdose: None identified today Fatal overdose hazard ratio (HR): Calculation deferred Non-fatal overdose hazard ratio (HR): Calculation deferred Risk of opioid abuse or  dependence: 0.7-3.0% with doses ? 36 MME/day and 6.1-26% with doses ? 120 MME/day. Substance use disorder (SUD) risk level: Pending results of Medical Psychology Evaluation for SUD Opioid risk tool (ORT) (Total Score): 3  ORT Scoring interpretation table:  Score <3 = Low Risk for SUD  Score between 4-7 = Moderate Risk for SUD  Score >8 = High Risk for Opioid Abuse   PHQ-2 Depression Scale:  Total score: 2  PHQ-2 Scoring interpretation table: (Score and probability of major depressive disorder)  Score 0 = No depression  Score 1 = 15.4% Probability  Score 2 = 21.1% Probability  Score 3 = 38.4% Probability  Score 4 = 45.5% Probability  Score 5 = 56.4% Probability  Score 6 = 78.6% Probability   PHQ-9 Depression Scale:  Total score: 5  PHQ-9 Scoring interpretation table:  Score 0-4 = No depression  Score 5-9 = Mild depression  Score 10-14 = Moderate depression  Score 15-19 = Moderately severe depression  Score 20-27 = Severe depression (2.4 times higher risk of SUD and 2.89 times higher risk of overuse)   Pharmacologic Plan: Pending ordered tests and/or consults  Meds  The patient has a current medication list which includes the following prescription(s): alendronate, aspirin-salicylamide-caffeine, atorvastatin, brimonidine, brinzolamide, calcium polycarbophil, fluticasone, hydrochlorothiazide, latanoprost, lisinopril, meclizine, melatonin-pyridoxine, memantine, mirtazapine, multivitamin with minerals, preservision areds, mupirocin ointment, nitrofurantoin (macrocrystal-monohydrate), omega-3 fatty acids, omeprazole, oxybutynin, rosuvastatin, sertraline, sertraline hcl, tramadol, travoprost (benzalkonium), and vitamin d (cholecalciferol).  Current Outpatient Medications on File Prior to Visit  Medication Sig  . alendronate (FOSAMAX) 70 MG tablet Take 70 mg by mouth once a week. Take with a full glass of water on an empty stomach.  . Aspirin-Salicylamide-Caffeine (BC HEADACHE POWDER PO)  Take 1 packet by mouth daily as needed (pain/headache).  [name] atorvastatin (LIPITOR) 40 MG tablet TAKE 1 TABLET  AT 6 PM  . brimonidine (ALPHAGAN P) 0.1 % SOLN Place 1 drop into both eyes 2 (two) times daily.  . brinzolamide (AZOPT) 1 % ophthalmic suspension Place 1 drop into the left eye 3 (three) times daily.  . Calcium Polycarbophil (FIBER-LAX PO) Take by mouth.  . fluticasone (FLONASE) 50 MCG/ACT nasal spray Place 2 sprays into both nostrils daily. (Patient taking differently: Place 2 sprays into both nostrils daily as needed (seasonal allergies). )  . hydrochlorothiazide (MICROZIDE) 12.5 MG capsule Take 1 capsule (12.5 mg total) by mouth daily.  [name] latanoprost (XALATAN) 0.005 % ophthalmic solution Place 1 drop into both eyes at bedtime.  [name] lisinopril (PRINIVIL,ZESTRIL) 10 MG tablet Take 1 tablet (10 mg total) by mouth daily.  . meclizine (ANTIVERT) 25 MG tablet Take 1 tablet (25 mg total) by mouth 3 (three) times daily as needed for dizziness.  . Melatonin-Pyridoxine (MELATIN PO) Take 3 mg by mouth.  . memantine (NAMENDA) 10 MG tablet Take 10 mg by mouth 2 (two) times daily.   . mirtazapine (REMERON) 30 MG tablet Take 30 mg by mouth at bedtime.  . Multiple Vitamin (MULTIVITAMIN WITH MINERALS) TABS tablet Take 1 tablet by mouth daily.  . Multiple Vitamins-Minerals (PRESERVISION AREDS) TABS Take 1 tablet by mouth daily.   . mupirocin ointment (BACTROBAN) 2 % Apply to affected area on chest and scalp bid x 1 week.  . nitrofurantoin, macrocrystal-monohydrate, (MACROBID) 100 MG capsule Take 1 capsule (100 mg total) by mouth every 12 (twelve) hours.  . Omega-3 Fatty Acids (FISH OIL CONCENTRATE PO) Take 1 capsule by mouth daily.   [name] omeprazole (PRILOSEC) 20 MG capsule Take 1 capsule (20 mg total) by mouth every morning.  [name] oxybutynin (OXYTROL) 3.9 MG/24HR Place 1 patch onto the skin every 3 (three) days.  . rosuvastatin (CRESTOR) 20 MG tablet Take 1 tablet (20 mg total) by mouth at bedtime.  .  sertraline (ZOLOFT) 100 MG tablet Take 150 mg by mouth daily.   . SERTRALINE HCL PO Take 100 mg by mouth daily.  . traMADol (ULTRAM) 50 MG tablet Take 50 mg by mouth every 6 (six) hours as needed.  . travoprost, benzalkonium, (TRAVATAN) 0.004 % ophthalmic solution Place 1 drop into both eyes at bedtime.  . vitamin D, CHOLECALCIFEROL, 400 UNITS tablet Take 400 Units by mouth daily.   No current facility-administered medications on file prior to visit.    Imaging Review  Cervical Imaging:  Cervical CT wo contrast:  Results for orders placed during the hospital encounter of 05/10/18  CT Cervical Spine Wo Contrast   Narrative CLINICAL DATA:  [place] backwards, struck posterior head. History of dementia, hypertension, hypercholesterolemia.  EXAM: CT HEAD WITHOUT CONTRAST  CT CERVICAL SPINE WITHOUT CONTRAST  TECHNIQUE: Multidetector CT imaging of the head and cervical spine was performed following the standard protocol without intravenous contrast. Multiplanar CT image reconstructions of the cervical spine were also generated.  COMPARISON:  MRI of the head January 15, 2016  FINDINGS: CT HEAD FINDINGS  BRAIN: No intraparenchymal hemorrhage, mass effect nor midline shift. Moderate parenchymal brain volume loss, disproportion mesial temporal lobe atrophy. Patchy supratentorial white matter hypodensities less than expected for patient's age, though non-specific are most compatible with chronic small vessel ischemic disease. Punctate LEFT temporal calcification. No acute large vascular territory infarcts. No abnormal extra-axial fluid collections. Basal cisterns are patent.  VASCULAR: Moderate calcific atherosclerosis of the carotid siphons.  SKULL: No skull fracture. Moderate LEFT parieto-occipital scalp hematoma without subcutaneous gas or radiopaque  foreign bodies.  SINUSES/ORBITS: Trace paranasal sinus mucosal thickening. Mastoid air cells are well aerated.The included ocular  globes and orbital contents are non-suspicious. Status post bilateral ocular lens implants.  OTHER: None.  CT CERVICAL SPINE FINDINGS  ALIGNMENT: Straightened lordosis. Grade 1 C4-5, C5-6 and C7-T1 anterolisthesis. C2 through C4 facets fused on degenerative basis. Severe RIGHT C4-5 and C5-6 facet arthropathy. Moderate to severe C5-6 and C6-7 disc height loss with endplate spurring compatible with degenerative discs. No destructive bony lesions. C1-2 articulation maintained with mild arthropathy. Calcified longus coli insertion.  SKULL BASE AND VERTEBRAE: Cervical vertebral bodies and posterior elements are intact. Intervertebral disc heights preserved. No destructive bony lesions. C1-2 articulation maintained.  SOFT TISSUES AND SPINAL CANAL: Nonacute. Moderate calcific atherosclerosis. 1 cm LEFT thyroid nodule, no routine indicated follow-up. This follows ACR consensus guidelines: Managing Incidental Thyroid Nodules Detected on Imaging: White Paper of the ACR Incidental Thyroid Findings Committee. J Am Coll Radiol 2015; 12:143-150.  DISC LEVELS: Severe RIGHT C4-5, bilateral C5-6, bilateral C6-7 neural foraminal narrowing. Mild canal stenosis C6-7.  UPPER CHEST: Lung apices are clear.  OTHER: None.  IMPRESSION: CT HEAD:  1. No acute intracranial process. Moderate LEFT posterior scalp hematoma. 2. Advanced mesial temporal lobe atrophy associated with neuro degenerative syndromes.  CT CERVICAL SPINE:  1. No fracture.  Multilevel spondylolisthesis on degenerative basis. 2. Severe C4-5 through C6-7 neural foraminal narrowing.  Aortic Atherosclerosis (ICD10-I70.0).   Electronically Signed   By: Elon Alas M.D.   On: 05/10/2018 17:09     Cervical DG 2-3 views:  Results for orders placed during the hospital encounter of 01/28/14  DG Cervical Spine 2 or 3 views   Narrative CLINICAL DATA:  Neck pain.  EXAM: CERVICAL SPINE - 2-3 VIEW  COMPARISON:   None.  FINDINGS: The prevertebral soft tissues are normal. The alignment is anatomic through T1. There is probable congenital incomplete segmentation at C3-4. There is moderate disc space loss with uncinate spurring at C5-6 and C6-7. Facet degenerative changes are present throughout the cervical spine. No acute osseous findings are apparent.  IMPRESSION: Multilevel cervical spondylosis as described. No acute osseous findings or malalignment demonstrated.   Electronically Signed   By: Camie Patience M.D.   On: 01/28/2014 14:06   Lumbosacral Imaging:  Results for orders placed during the hospital encounter of 01/28/14  DG Lumbar Spine Complete   Narrative CLINICAL DATA:  Back pain.  No known injury.  EXAM: LUMBAR SPINE - COMPLETE 4+ VIEW  COMPARISON:  None.  FINDINGS: There are 5 lumbar type vertebral bodies. At L4-5, there is advanced disc space loss with 8 mm of anterolisthesis due to bilateral facet disease. There is a minimal anterolisthesis at L3-4 and L5-S1. The alignment is otherwise normal aside from a mild convex left scoliosis. There is no evidence of acute fracture or pars defect. Cholecystectomy clips are noted.  IMPRESSION: Degenerative grade 1 anterolisthesis at L4-5 with mild convex left scoliosis. No acute osseous findings demonstrated.   Electronically Signed   By: Camie Patience M.D.   On: 01/28/2014 14:09     Note: Available results from prior imaging studies were reviewed.        ROS  Cardiovascular History: No reported cardiovascular signs or symptoms such as High blood pressure, coronary artery disease, abnormal heart rate or rhythm, heart attack, blood thinner therapy or heart weakness and/or failure Pulmonary or Respiratory History: Shortness of breath Neurological History: Incontinence:  Fecal Review of Past Neurological Studies:  Results for orders  placed or performed during the hospital encounter of 07/02/18  CT HEAD W & WO CONTRAST    Narrative   CLINICAL DATA:  Altered mental status, unspecified. Recent fall 05/10/2018.  EXAM: CT HEAD WITHOUT AND WITH CONTRAST  TECHNIQUE: Contiguous axial images were obtained from the base of the skull through the vertex without and with intravenous contrast  CONTRAST:  101m ISOVUE-300 IOPAMIDOL (ISOVUE-300) INJECTION 61%  COMPARISON:  Head CT 05/10/2018.  FINDINGS: Brain: No evidence for acute infarction, hemorrhage, mass lesion, hydrocephalus, or extra-axial fluid. Generalized atrophy, not unexpected for age. Hypoattenuation of white matter, likely small vessel disease.  Post infusion, no abnormal enhancement of the brain or meninges.  Vascular: Calcification of the cavernous internal carotid arteries consistent with cerebrovascular atherosclerotic disease. No signs of intracranial large vessel occlusion. Visible vessels are patent.  Skull: Normal. Negative for fracture. Osseous remodeling secondary to Pacchionian granulations at the torcula and along the LEFT transverse sinus, normal variant.  Sinuses/Orbits: No acute finding.  BILATERAL cataract extraction.  Other: None.  IMPRESSION: Normal for age atrophy with mild hypoattenuation of white matter, likely small vessel disease. No acute intracranial findings. No abnormal postcontrast enhancement.  No posttraumatic sequelae are evident.   Electronically Signed   By: JStaci RighterM.D.   On: 07/02/2018 15:12   Results for orders placed or performed during the hospital encounter of 05/10/18  CT Head Wo Contrast   Narrative   CLINICAL DATA:  [place], struck posterior head. History of dementia, hypertension, hypercholesterolemia.  EXAM: CT HEAD WITHOUT CONTRAST  CT CERVICAL SPINE WITHOUT CONTRAST  TECHNIQUE: Multidetector CT imaging of the head and cervical spine was performed following the standard protocol without intravenous contrast. Multiplanar CT image reconstructions of the cervical  spine were also generated.  COMPARISON:  MRI of the head January 15, 2016  FINDINGS: CT HEAD FINDINGS  BRAIN: No intraparenchymal hemorrhage, mass effect nor midline shift. Moderate parenchymal brain volume loss, disproportion mesial temporal lobe atrophy. Patchy supratentorial white matter hypodensities less than expected for patient's age, though non-specific are most compatible with chronic small vessel ischemic disease. Punctate LEFT temporal calcification. No acute large vascular territory infarcts. No abnormal extra-axial fluid collections. Basal cisterns are patent.  VASCULAR: Moderate calcific atherosclerosis of the carotid siphons.  SKULL: No skull fracture. Moderate LEFT parieto-occipital scalp hematoma without subcutaneous gas or radiopaque foreign bodies.  SINUSES/ORBITS: Trace paranasal sinus mucosal thickening. Mastoid air cells are well aerated.The included ocular globes and orbital contents are non-suspicious. Status post bilateral ocular lens implants.  OTHER: None.  CT CERVICAL SPINE FINDINGS  ALIGNMENT: Straightened lordosis. Grade 1 C4-5, C5-6 and C7-T1 anterolisthesis. C2 through C4 facets fused on degenerative basis. Severe RIGHT C4-5 and C5-6 facet arthropathy. Moderate to severe C5-6 and C6-7 disc height loss with endplate spurring compatible with degenerative discs. No destructive bony lesions. C1-2 articulation maintained with mild arthropathy. Calcified longus coli insertion.  SKULL BASE AND VERTEBRAE: Cervical vertebral bodies and posterior elements are intact. Intervertebral disc heights preserved. No destructive bony lesions. C1-2 articulation maintained.  SOFT TISSUES AND SPINAL CANAL: Nonacute. Moderate calcific atherosclerosis. 1 cm LEFT thyroid nodule, no routine indicated follow-up. This follows ACR consensus guidelines: Managing Incidental Thyroid Nodules Detected on Imaging: White Paper of the ACR Incidental Thyroid Findings Committee.  J Am Coll Radiol 2015; 12:143-150.  DISC LEVELS: Severe RIGHT C4-5, bilateral C5-6, bilateral C6-7 neural foraminal narrowing. Mild canal stenosis C6-7.  UPPER CHEST: Lung apices are clear.  OTHER: None.  IMPRESSION: CT HEAD:  1.  No acute intracranial process. Moderate LEFT posterior scalp hematoma. 2. Advanced mesial temporal lobe atrophy associated with neuro degenerative syndromes.  CT CERVICAL SPINE:  1. No fracture.  Multilevel spondylolisthesis on degenerative basis. 2. Severe C4-5 through C6-7 neural foraminal narrowing.  Aortic Atherosclerosis (ICD10-I70.0).   Electronically Signed   By: Elon Alas M.D.   On: 05/10/2018 17:09   Results for orders placed or performed during the hospital encounter of 03/01/16  MR Brain Wo Contrast   Narrative   CLINICAL DATA:  Vertigo  EXAM: MRI HEAD WITHOUT CONTRAST  MRA HEAD WITHOUT CONTRAST  TECHNIQUE: Multiplanar, multiecho pulse sequences of the brain and surrounding structures were obtained without intravenous contrast. Angiographic images of the head were obtained using MRA technique without contrast.  COMPARISON:  CT head 03/01/2016  FINDINGS: MRI HEAD FINDINGS  Generalized atrophy. Ventricular enlargement consistent with atrophy.  Negative for acute infarct. Mild chronic white matter changes bilaterally. Mild chronic changes in the basilar. These are most consistent with chronic microvascular ischemia.  Negative for intracranial hemorrhage or fluid collection  Negative for mass or edema.  Mild mucosal edema paranasal sinuses. Bilateral lens replacement. No orbital mass.  Image quality degraded by motion.  MRA HEAD FINDINGS  Image quality degraded by motion.  Both vertebral arteries patent to the basilar. Fetal origin right posterior cerebral artery. Mild stenosis mid right posterior cerebral artery. Left posterior cerebral artery patent with mild stenosis distally. Superior cerebellar  arteries patent bilaterally. PICA patent bilaterally.  Irregularity of the cavernous carotid artery bilaterally with decreased signal bilaterally. This could be due to stenosis or artifact due to tortuosity. Anterior and middle cerebral arteries patent bilaterally without significant stenosis. Hypoplastic right A1 segment. Both anterior cerebral arteries supplied from the left.  Negative for vascular malformation or aneurysm.  IMPRESSION: Image quality degraded by motion  Negative for acute infarct  Atrophy with mild chronic microvascular ischemia  Bilateral posterior cerebral artery stenoses. Probable stenosis of the cavernous carotid bilaterally, difficult to quantitate due to tortuosity and motion.   Electronically Signed   By: Franchot Gallo M.D.   On: 03/02/2016 11:02   Results for orders placed or performed during the hospital encounter of 10/10/13  MR MRA HEAD WO CONTRAST   Narrative   CLINICAL DATA:  84 year old female with loss of vision in the left eye. Initial encounter. Shortness of breath.  EXAM: MRI HEAD WITHOUT CONTRAST  MRA HEAD WITHOUT CONTRAST  TECHNIQUE: Multiplanar, multiecho pulse sequences of the brain and surrounding structures were obtained without intravenous contrast. Angiographic images of the head were obtained using MRA technique without contrast.  COMPARISON:  Head CT without contrast 09/10/2012, 11/08/2007.  FINDINGS: MRI HEAD FINDINGS  Cerebral volume is within normal limits for age. No restricted diffusion to suggest acute infarction. No midline shift, mass effect, evidence of mass lesion, ventriculomegaly, extra-axial collection or acute intracranial hemorrhage. Cervicomedullary junction and pituitary are within normal limits. Negative visualized cervical spine. Major intracranial vascular flow voids are preserved.  Pearline Cables and white matter signal is within normal limits for age throughout the brain. Postoperative changes to  both globes. Otherwise orbits soft tissues appear within normal limits. Optic chiasm appears diminutive but otherwise normal.  Opacified right sphenoid sinus with inspissated material. This was partially calcified on 09/10/2012. Other Visualized paranasal sinuses and mastoids are clear. Visible internal auditory structures appear normal. Visualized scalp soft tissues are within normal limits. Normal bone marrow signal.  MRA HEAD FINDINGS  Antegrade flow in the posterior circulation with codominant  distal vertebral arteries. Normal PICA origins. Patent vertebrobasilar junction. Patent AICA origins. No basilar artery stenosis. SCA and left PCA origins are normal. Fetal type right PCA origin. Left posterior communicating artery diminutive or absent. Bilateral PCA branches are within normal limits.  Antegrade flow in both ICA siphons. Tortuous distal cervical ICAs, more so the left. There is a degree of dolichoectasia a both siphons. No ICA stenosis. Both ophthalmic artery origins appear within normal limits. Normal right posterior communicating artery origin.  Hypoplastic or absent right ACA A1 segment. Anterior communicating artery and visualized ACA branches within normal limits. Normal left ICA terminus, left MCA and ACA origin. Normal right MCA origin. Visualized bilateral MCA branches are within normal limits.  IMPRESSION: 1. No acute intracranial abnormality. Normal for age non contrast MRI appearance of the brain. 2. Negative intracranial MRA except for ICA dolichoectasia and several normal anatomic variations. 3. Chronic right sphenoid sinusitis.   Electronically Signed   By: Lars Pinks M.D.   On: 10/11/2013 13:57    Psychological-Psychiatric History: Anxiousness, Depressed, Prone to panicking and Difficulty sleeping and or falling asleep Gastrointestinal History: No reported gastrointestinal signs or symptoms such as vomiting or evacuating blood, reflux, heartburn,  alternating episodes of diarrhea and constipation, inflamed or scarred liver, or pancreas or irrregular and/or infrequent bowel movements Genitourinary History: No reported renal or genitourinary signs or symptoms such as difficulty voiding or producing urine, peeing blood, non-functioning kidney, kidney stones, difficulty emptying the bladder, difficulty controlling the flow of urine, or chronic kidney disease Hematological History: No reported hematological signs or symptoms such as prolonged bleeding, low or poor functioning platelets, bruising or bleeding easily, hereditary bleeding problems, low energy levels due to low hemoglobin or being anemic Endocrine History: No reported endocrine signs or symptoms such as high or low blood sugar, rapid heart rate due to high thyroid levels, obesity or weight gain due to slow thyroid or thyroid disease Rheumatologic History: No reported rheumatological signs and symptoms such as fatigue, joint pain, tenderness, swelling, redness, heat, stiffness, decreased range of motion, with or without associated rash Musculoskeletal History: Negative for myasthenia gravis, muscular dystrophy, multiple sclerosis or malignant hyperthermia Work History: Retired  Allergies  Ms. Heldt is allergic to [employer] tartrate] and codeine.  Laboratory Chemistry  Inflammation Markers Lab Results  Component Value Date   CRP <0.5 01/28/2014   ESRSEDRATE 11 05/31/2016   (CRP: Acute Phase) (ESR: Chronic Phase) Renal Function Markers Lab Results  Component Value Date   BUN 23 01/20/2017   CREATININE 0.90 07/02/2018   GFRAA >60 03/04/2016   GFRNONAA 54 (L) 03/04/2016   Hepatic Function Markers Lab Results  Component Value Date   AST 21 10/18/2016   ALT 18 10/18/2016   ALBUMIN 4.2 10/18/2016   ALKPHOS 116 11/11/2016   Electrolytes Lab Results  Component Value Date   NA 142 01/20/2017   K 4.3 01/20/2017   CL 109 01/20/2017   CALCIUM 9.7 01/20/2017   MG 1.9  10/13/2013   Neuropathy Markers No results found for: [id number] Bone Pathology Markers Lab Results  Component Value Date   ALKPHOS 116 11/11/2016   CALCIUM 9.7 01/20/2017   Coagulation Parameters Lab Results  Component Value Date   INR 1.07 01/15/2016   LABPROT 14.1 01/15/2016   APTT 33 01/15/2016   PLT 270.0 05/31/2016   Cardiovascular Markers Lab Results  Component Value Date   HGB 13.4 05/31/2016   HCT 40.0 05/31/2016   Note: Lab results reviewed.  PFSH  Drug:  Ms. Mcadoo  reports that she does not use drugs. Alcohol:  reports that she does not drink alcohol. Tobacco:  reports that she has never smoked. She has never used smokeless tobacco. Medical:  has a past medical history of Anxiety, Arthritis, Cancer ([place]), Chronic headaches, Cystocele, Dementia ([place]), Depression, GERD (gastroesophageal reflux disease), Glaucoma, Hypercholesterolemia, Hypertension, Macular degeneration, Reactive airway disease, Rectocele, Right bundle branch block, Sleep apnea, Uterine cancer ([place]), Vertigo, and Wears dentures. Family: family history includes Cancer in her daughter and father; Heart disease in her brother and mother.  Past Surgical History:  Procedure Laterality Date  . ABDOMINAL HYSTERECTOMY    . ABDOMINAL HYSTERECTOMY    . APPENDECTOMY    . CHOLECYSTECTOMY    . IMAGE GUIDED SINUS SURGERY N/A 06/20/2015   Procedure: IMAGE GUIDED SINUS SURGERY;  Surgeon: Clyde Canterbury, MD;  Location: [place];  Service: ENT;  Laterality: N/A;  GAVE DISK TO CE CE  . REPLACEMENT TOTAL KNEE Left   . SPHENOIDECTOMY Bilateral 06/20/2015   Procedure: SPHENOIDECTOMY;  Surgeon: Clyde Canterbury, MD;  Location: [place];  Service: ENT;  Laterality: Bilateral;  . uterine cancer    . VULVECTOMY     Active Ambulatory Problems    Diagnosis Date Noted  . Dizziness 09/10/2012  . Essential hypertension, benign 09/10/2012  . History of frequent urinary tract infections 02/01/2013  .  Difficulty sleeping 02/01/2013  . Hypercholesterolemia 02/01/2013  . Pulmonary hypertension ([place]) 02/01/2013  . Headache 02/28/2013  . Loss of vision 10/11/2013  . Vision loss, left eye 10/11/2013  . Anxiety 10/11/2013  . Chest pain 10/11/2013  . DOE (dyspnea on exertion) 10/11/2013  . Neck pain 12/12/2013  . Skin lesion of breast 12/12/2013  . Back pain 01/30/2014  . Joint pain 01/30/2014  . Elevated alkaline phosphatase level 01/30/2014  . Cough 03/14/2014  . Pain of left thumb 05/31/2014  . Hip pain 10/09/2014  . URI (upper respiratory infection) 10/24/2014  . Frequent falls 12/04/2014  . Hyperglycemia 12/04/2014  . Health care maintenance 12/04/2014  . Vertigo 03/01/2016  . Hyperlipidemia 03/02/2016  . GERD (gastroesophageal reflux disease) 12/14/2012  . Dementia ([place]) 03/02/2016  . Shoulder pain 05/31/2016  . Abnormal levels of other serum enzymes 01/30/2014  . Bladder prolapse, female, acquired 02/15/2014  . Chronic headaches 12/14/2012  . Disorder of breast 12/12/2013  . Frequency of micturition 12/24/2013  . Major depressive disorder, single episode 02/01/2013  . Mixed stress and urge urinary incontinence 02/15/2014  . OAB (overactive bladder) 06/17/2014  . Osteoarthritis 12/14/2012  . Pain of finger of left hand 05/31/2014  . Rectocele 03/17/2014  . Repeated falls 12/04/2014  . Sleep disorder 02/01/2013  . Urethral prolapse 02/15/2014  . Vaginal atrophy 03/17/2014  . Dysuria 02/15/2014  . Anxiety disorder 10/11/2013  . Depression with anxiety 12/14/2012  . Other forms of dyspnea 10/11/2013  . Essential (primary) hypertension 09/10/2012  . Dorsalgia 01/30/2014  . Encounter for general adult medical examination without abnormal findings 12/04/2014  . Pain in hip 10/09/2014  . Personal history of urinary infection 02/01/2013  . Pure hypercholesterolemia 02/01/2013  . Pain in joint 01/30/2014  . Unqualified visual loss of left eye with normal vision of  contralateral eye 10/11/2013  . Visual loss 10/11/2013  . Other secondary pulmonary hypertension ([place]) 02/01/2013  . Acute upper respiratory infection 10/24/2014  . Chronic pain of both shoulders (Primary Area of Pain) (R>L) 07/14/2018  . Chronic pain of right knee (Secondary Area of Pain) 07/14/2018  .  Chronic pain of both hips [facility] Area of Pain) (R>L) 07/14/2018  . Chronic pain syndrome 07/14/2018  . Pharmacologic therapy 07/14/2018  . Disorder of skeletal system 07/14/2018  . Problems influencing health status 07/14/2018   Resolved Ambulatory Problems    Diagnosis Date Noted  . Depression 02/01/2013  . Essential hypertension 03/02/2016   Past Medical History:  Diagnosis Date  . Arthritis   . Cancer ([place])   . Cystocele   . Glaucoma   . Hypertension   . Macular degeneration   . Reactive airway disease   . Right bundle branch block   . Sleep apnea   . Uterine cancer ([place])   . Wears dentures    Constitutional Exam  General appearance: Well nourished, well developed, and well hydrated. In no apparent acute distress Vitals:   07/14/18 1007  BP: (!) 141/59  Pulse: 73  Resp: 16  Temp: (!) 97.3 F (36.3 C)  SpO2: 92%  Weight: 162 lb (73.5 kg)  Height: '5\' 4"'$  (1.626 m)   BMI Assessment: Estimated body mass index is 27.81 kg/m as calculated from the following:   Height as of this encounter: '5\' 4"'$  (1.626 m).   Weight as of this encounter: 162 lb (73.5 kg).  BMI interpretation table: BMI level Category Range association with higher incidence of chronic pain  <18 kg/m2 Underweight   18.5-24.9 kg/m2 Ideal body weight   25-29.9 kg/m2 Overweight Increased incidence by 20%  30-34.9 kg/m2 Obese (Class I) Increased incidence by 68%  35-39.9 kg/m2 Severe obesity (Class II) Increased incidence by 136%  >40 kg/m2 Extreme obesity (Class III) Increased incidence by 254%   BMI Readings from Last 4 Encounters:  07/14/18 27.81 kg/m  05/10/18 26.78 kg/m  01/23/17 29.65 kg/m   12/10/16 29.77 kg/m   Wt Readings from Last 4 Encounters:  07/14/18 162 lb (73.5 kg)  05/10/18 156 lb (70.8 kg)  01/23/17 167 lb 6.4 oz (75.9 kg)  12/10/16 165 lb 6.4 oz (75 kg)  Psych/Mental status: Alert, oriented x 3 (person, place, & time)       Eyes: PERLA Respiratory: No evidence of acute respiratory distress  Cervical Spine Exam  Inspection: No masses, redness, or swelling Alignment: Symmetrical Functional ROM: Unrestricted ROM      Stability: No instability detected Muscle strength & Tone: Functionally intact Sensory: Unimpaired Palpation: No palpable anomalies              Upper Extremity (UE) Exam    Side: Right upper extremity  Side: Left upper extremity  Inspection: No masses, redness, swelling, or asymmetry. No contractures  Inspection: No masses, redness, swelling, or asymmetry. No contractures  Functional ROM: Pain restricted ROM          Functional ROM: Restricted ROM          Muscle strength & Tone: Functionally intact  Muscle strength & Tone: Functionally intact  Sensory: Unimpaired  Sensory: Unimpaired  Palpation: Complains of area being tender to palpation              Palpation: Complains of area being tender to palpation              Specialized Test(s): Deferred         Specialized Test(s): Deferred          Thoracic Spine Exam  Inspection: No masses, redness, or swelling Alignment: Symmetrical Functional ROM: Unrestricted ROM Stability: No instability detected Sensory: Unimpaired Muscle strength & Tone: Complains of area being tender to palpation  Lumbar Spine Exam  Inspection: No masses, redness, or swelling Alignment: Symmetrical Functional ROM: Unrestricted ROM      Stability: No instability detected Muscle strength & Tone: Functionally intact Sensory: Unimpaired Palpation: No palpable anomalies       Provocative Tests: Lumbar Hyperextension and rotation test: evaluation deferred today       Patrick's Maneuver: evaluation deferred today                     Gait & Posture Assessment  Ambulation: Patient ambulates using a wheel chair Gait: Relatively normal for age and body habitus Posture: WNL   Lower Extremity Exam    Side: Right lower extremity  Side: Left lower extremity  Inspection:  No visible scar from previous surgery   Inspection: No masses, redness, swelling, or asymmetry. No contractures  Functional ROM: Decreased ROM          Functional ROM: Decreased ROM          Muscle strength & Tone: Functionally intact  Muscle strength & Tone: Functionally intact  Sensory: Unimpaired  Sensory: Unimpaired  Palpation: Tender  Palpation: No palpable anomalies   Assessment  Primary Diagnosis & Pertinent Problem List: Diagnoses of Chronic pain of both shoulders (Primary Area of Pain) (R>L), Chronic pain of right knee, Chronic pain of both hips (Tertiary Area of Pain) (R>L), Chronic pain syndrome, Pharmacologic therapy, Disorder of skeletal system, and Problems influencing health status were pertinent to this visit.  Visit Diagnosis: 1. Chronic pain of both shoulders (Primary Area of Pain) (R>L)   2. Chronic pain of right knee   3. Chronic pain of both hips [facility] Area of Pain) (R>L)   4. Chronic pain syndrome   5. Pharmacologic therapy   6. Disorder of skeletal system   7. Problems influencing health status    Plan of Care  Initial treatment plan:  Please be advised that as per protocol, today's visit has been an evaluation only. We have not taken over the patient's controlled substance management.  Problem-specific plan: No problem-specific Assessment & Plan notes found for this encounter.  Ordered Lab-work, Procedure(s), Referral(s), & Consult(s): Orders Placed This Encounter  Procedures  . DG Knee 1-2 Views Right  . DG HIP UNILAT W OR W/O PELVIS 2-3 VIEWS LEFT  . DG HIP UNILAT W OR W/O PELVIS 2-3 VIEWS RIGHT  . DG Shoulder Right  . DG Shoulder Left  . Compliance Drug Analysis, Ur  . Comp. Metabolic Panel (12)   . Magnesium  . Vitamin B12  . Sedimentation rate  . 25-Hydroxyvitamin D Lcms D2+D3  . C-reactive protein   Pharmacotherapy: Medications ordered:  No orders of the defined types were placed in this encounter.  Medications administered during this visit: Areatha Keas had no medications administered during this visit.   Pharmacotherapy under consideration:  Opioid Analgesics: The patient was informed that there is no guarantee that she would be a candidate for opioid analgesics. The decision will be made following CDC guidelines. This decision will be based on the results of diagnostic studies, as well as Ms. Kauk's risk profile.  Membrane stabilizer: To be determined at a later time Muscle relaxant: To be determined at a later time NSAID: To be determined at a later time Other analgesic(s): To be determined at a later time   Interventional therapies under consideration: Ms. Murton was informed that there is no guarantee that she would be a candidate for interventional therapies. The decision will be based on  the results of diagnostic studies, as well as Ms. Butt's risk profile.  Possible procedure(s): Diagnostic bilateral intra-articular shoulder injections Diagnostic bilateral suprascapular nerve blocks Possible bilateral suprascapular radiofrequency ablation Diagnostic right intra-articular knee injection Diagnostic right knee Hyalgan series Diagnostic right genicular nerve block Possible right genicular nerve radiofrequency ablation   Provider-requested follow-up: Return for 2nd Visit, w/ Dr. Dossie Arbour.  Future Appointments  Date Time Provider [place]  07/27/2018 11:30 AM Milinda Pointer, MD [facility] None    Primary Care Physician: System, Pcp Not In Location: [facility] Note by:  Date: 07/14/2018; Time: 3:20 PM  Pain Score Disclaimer: We use the NRS-11 scale. This is a self-reported, subjective measurement of pain severity  with only modest accuracy. It is used primarily to identify changes within a particular patient. It must be understood that outpatient pain scales are significantly less accurate that those used for research, where they can be applied under ideal controlled circumstances with minimal exposure to variables. In reality, the score is likely to be a combination of pain intensity and pain affect, where pain affect describes the degree of emotional arousal or changes in action readiness caused by the sensory experience of pain. Factors such as social and work situation, setting, emotional state, anxiety levels, expectation, and prior pain experience may influence pain perception and show large inter-individual differences that may also be affected by time variables.  Patient instructions provided during this appointment: Patient Instructions   ____________________________________________________________________________________________  Appointment Policy Summary  It is our goal and responsibility to provide the medical community with assistance in the evaluation and management of patients with chronic pain. Unfortunately our resources are limited. Because we do not have an unlimited amount of time, or available appointments, we are required to closely monitor and manage their use. The following rules exist to maximize their use:  Patient's responsibilities: 1. Punctuality:  At what time should I arrive? You should be physically present in our office 30 minutes before your scheduled appointment. Your scheduled appointment is with your assigned healthcare provider. However, it takes 5-10 minutes to be "checked-in", and another 15 minutes for the nurses to do the admission. If you arrive to our office at the time you were given for your appointment, you will end up being at least 20-25 minutes late to your appointment with the provider. 2. Tardiness:  What happens if I arrive only a few minutes after my  scheduled appointment time? You will need to reschedule your appointment. The cutoff is your appointment time. This is why it is so important that you arrive at least 30 minutes before that appointment. If you have an appointment scheduled for 10:00 AM and you arrive at 10:01, you will be required to reschedule your appointment.  3. Plan ahead:  Always assume that you will encounter traffic on your way in. Plan for it. If you are dependent on a driver, make sure they understand these rules and the need to arrive early. 4. Other appointments and responsibilities:  Avoid scheduling any other appointments before or after your pain clinic appointments.  5. Be prepared:  Write down everything that you need to discuss with your healthcare provider and give this information to the admitting nurse. Write down the medications that you will need refilled. Bring your pills and bottles (even the empty ones), to all of your appointments, except for those where a procedure is scheduled. 6. No children or pets:  Find someone to take care of them. It is not appropriate  to bring them in. 7. Scheduling changes:  We request "advanced notification" of any changes or cancellations. 8. Advanced notification:  Defined as a time period of more than 24 hours prior to the originally scheduled appointment. This allows for the appointment to be offered to other patients. 9. Rescheduling:  When a visit is rescheduled, it will require the cancellation of the original appointment. For this reason they both fall within the category of "Cancellations".  10. Cancellations:  They require advanced notification. Any cancellation less than 24 hours before the  appointment will be recorded as a "No Show". 11. No Show:  Defined as an unkept appointment where the patient failed to notify or declare to the practice their intention or inability to keep the appointment.  Corrective process for repeat offenders:  1. Tardiness: Three (3)  episodes of rescheduling due to late arrivals will be recorded as one (1) "No Show". 2. Cancellation or reschedule: Three (3) cancellations or rescheduling will be recorded as one (1) "No Show". 3. "No Shows": Three (3) "No Shows" within a 12 month period will result in discharge from the practice. ____________________________________________________________________________________________  ____________________________________________________________________________________________  Pain Scale  Introduction: The pain score used by this practice is the Verbal Numerical Rating Scale (VNRS-11). This is an 11-point scale. It is for adults and children 10 years or older. There are significant differences in how the pain score is reported, used, and applied. Forget everything you learned in the past and learn this scoring system.  General Information: The scale should reflect your current level of pain. Unless you are specifically asked for the level of your worst pain, or your average pain. If you are asked for one of these two, then it should be understood that it is over the past 24 hours.  Basic Activities of Daily Living (ADL): Personal hygiene, dressing, eating, transferring, and using restroom.  Instructions: Most patients tend to report their level of pain as a combination of two factors, their physical pain and their psychosocial pain. This last one is also known as "suffering" and it is reflection of how physical pain affects you socially and psychologically. From now on, report them separately. From this point on, when asked to report your pain level, report only your physical pain. Use the following table for reference.  Pain Clinic Pain Levels (0-5/10)  Pain Level Score  Description  No Pain 0   Mild pain 1 Nagging, annoying, but does not interfere with basic activities of daily living (ADL). Patients are able to eat, bathe, get dressed, toileting (being able to get on and off the toilet  and perform personal hygiene functions), transfer (move in and out of bed or a chair without assistance), and maintain continence (able to control bladder and bowel functions). Blood pressure and heart rate are unaffected. A normal heart rate for a healthy adult ranges from 60 to 100 bpm (beats per minute).   Mild to moderate pain 2 Noticeable and distracting. Impossible to hide from other people. More frequent flare-ups. Still possible to adapt and function close to normal. It can be very annoying and may have occasional stronger flare-ups. With discipline, patients may get used to it and adapt.   Moderate pain 3 Interferes significantly with activities of daily living (ADL). It becomes difficult to feed, bathe, get dressed, get on and off the toilet or to perform personal hygiene functions. Difficult to get in and out of bed or a chair without assistance. Very distracting. With effort, it can be ignored  when deeply involved in activities.   Moderately severe pain 4 Impossible to ignore for more than a few minutes. With effort, patients may still be able to manage work or participate in some social activities. Very difficult to concentrate. Signs of autonomic nervous system discharge are evident: dilated pupils (mydriasis); mild sweating (diaphoresis); sleep interference. Heart rate becomes elevated (>115 bpm). Diastolic blood pressure (lower number) rises above 100 mmHg. Patients find relief in laying down and not moving.   Severe pain 5 Intense and extremely unpleasant. Associated with frowning face and frequent crying. Pain overwhelms the senses.  Ability to do any activity or maintain social relationships becomes significantly limited. Conversation becomes difficult. Pacing back and forth is common, as getting into a comfortable position is nearly impossible. Pain wakes you up from deep sleep. Physical signs will be obvious: pupillary dilation; increased sweating; goosebumps; brisk reflexes; cold,  clammy hands and feet; nausea, vomiting or dry heaves; loss of appetite; significant sleep disturbance with inability to fall asleep or to remain asleep. When persistent, significant weight loss is observed due to the complete loss of appetite and sleep deprivation.  Blood pressure and heart rate becomes significantly elevated. Caution: If elevated blood pressure triggers a pounding headache associated with blurred vision, then the patient should immediately seek attention at an urgent or emergency care unit, as these may be signs of an impending stroke.    Emergency Department Pain Levels (6-10/10)  Emergency Room Pain 6 Severely limiting. Requires emergency care and should not be seen or managed at an outpatient pain management facility. Communication becomes difficult and requires great effort. Assistance to reach the emergency department may be required. Facial flushing and profuse sweating along with potentially dangerous increases in heart rate and blood pressure will be evident.   Distressing pain 7 Self-care is very difficult. Assistance is required to transport, or use restroom. Assistance to reach the emergency department will be required. Tasks requiring coordination, such as bathing and getting dressed become very difficult.   Disabling pain 8 Self-care is no longer possible. At this level, pain is disabling. The individual is unable to do even the most "basic" activities such as walking, eating, bathing, dressing, transferring to a bed, or toileting. Fine motor skills are lost. It is difficult to think clearly.   Incapacitating pain 9 Pain becomes incapacitating. Thought processing is no longer possible. Difficult to remember your own name. Control of movement and coordination are lost.   The worst pain imaginable 10 At this level, most patients pass out from pain. When this level is reached, collapse of the autonomic nervous system occurs, leading to a sudden drop in blood pressure and  heart rate. This in turn results in a temporary and dramatic drop in blood flow to the brain, leading to a loss of consciousness. Fainting is one of the body's self defense mechanisms. Passing out puts the brain in a calmed state and causes it to shut down for a while, in order to begin the healing process.    Summary: 1. Refer to this scale when providing [place] with your pain level. 2. Be accurate and careful when reporting your pain level. This will help with your care. 3. Over-reporting your pain level will lead to loss of credibility. 4. Even a level of 1/10 means that there is pain and will be treated at our facility. 5. High, inaccurate reporting will be documented as "Symptom Exaggeration", leading to loss of credibility and suspicions of possible secondary gains such as obtaining  more narcotics, or wanting to appear disabled, for fraudulent reasons. 6. Only pain levels of 5 or below will be seen at our facility. 7. Pain levels of 6 and above will be sent to the Emergency Department and the appointment cancelled. ____________________________________________________________________________________________    BMI Assessment: Estimated body mass index is 27.81 kg/m as calculated from the following:   Height as of this encounter: '5\' 4"'$  (1.626 m).   Weight as of this encounter: 162 lb (73.5 kg).  BMI interpretation table: BMI level Category Range association with higher incidence of chronic pain  <18 kg/m2 Underweight   18.5-24.9 kg/m2 Ideal body weight   25-29.9 kg/m2 Overweight Increased incidence by 20%  30-34.9 kg/m2 Obese (Class I) Increased incidence by 68%  35-39.9 kg/m2 Severe obesity (Class II) Increased incidence by 136%  >40 kg/m2 Extreme obesity (Class III) Increased incidence by 254%   BMI Readings from Last 4 Encounters:  07/14/18 27.81 kg/m  05/10/18 26.78 kg/m  01/23/17 29.65 kg/m  12/10/16 29.77 kg/m   Wt Readings from Last 4 Encounters:  07/14/18 162 lb (73.5  kg)  05/10/18 156 lb (70.8 kg)  01/23/17 167 lb 6.4 oz (75.9 kg)  12/10/16 165 lb 6.4 oz (75 kg)

## 2018-07-19 LAB — COMP. METABOLIC PANEL (12)
A/G RATIO: 1.8 (ref 1.2–2.2)
ALK PHOS: 101 IU/L (ref 39–117)
AST: 16 IU/L (ref 0–40)
Albumin: 4.4 g/dL (ref 3.2–4.6)
BUN/Creatinine Ratio: 17 (ref 12–28)
BUN: 17 mg/dL (ref 10–36)
Bilirubin Total: <0.2 mg/dL (ref 0.0–1.2)
CALCIUM: 9.8 mg/dL (ref 8.7–10.3)
CHLORIDE: 103 mmol/L (ref 96–106)
Creatinine, Ser: 1 mg/dL (ref 0.57–1.00)
GFR calc non Af Amer: 50 mL/min/{1.73_m2} — ABNORMAL LOW (ref >59)
GFR, EST AFRICAN AMERICAN: 57 mL/min/{1.73_m2} — AB (ref >59)
Globulin, Total: 2.4 g/dL (ref 1.5–4.5)
Glucose: 99 mg/dL (ref 65–99)
POTASSIUM: 4.6 mmol/L (ref 3.5–5.2)
Sodium: 142 mmol/L (ref 134–144)
Total Protein: 6.8 g/dL (ref 6.0–8.5)

## 2018-07-19 LAB — VITAMIN B12: VITAMIN B 12: 278 pg/mL (ref 232–1245)

## 2018-07-19 LAB — SEDIMENTATION RATE: SED RATE: 22 mm/h (ref 0–40)

## 2018-07-19 LAB — 25-HYDROXYVITAMIN D LCMS D2+D3: 25-HYDROXY, VITAMIN D: 56 ng/mL

## 2018-07-19 LAB — COMPLIANCE DRUG ANALYSIS, UR

## 2018-07-19 LAB — 25-HYDROXY VITAMIN D LCMS D2+D3: 25-Hydroxy, Vitamin D-3: 56 ng/mL

## 2018-07-19 LAB — MAGNESIUM: Magnesium: 1.8 mg/dL (ref 1.6–2.3)

## 2018-07-19 LAB — C-REACTIVE PROTEIN: CRP: 8 mg/L (ref 0–10)

## 2018-07-20 NOTE — Progress Notes (Signed)
Results were reviewed and found to be: significantly abnormal  No acute injury or pathology identified  Review would suggest interventional pain management techniques may be of benefit

## 2018-07-20 NOTE — Progress Notes (Signed)
Results were reviewed and found to be: mildly abnormal  No acute injury or pathology identified  Review would suggest interventional pain management techniques may be of benefit 

## 2018-07-23 NOTE — Progress Notes (Addendum)
Patient's Name: [name]  MRN: [id number]  Referring Provider: No ref. provider found  DOB: 1926/05/01  PCP: System, Pcp Not In  DOS: 07/27/2018  Note by: Gaspar Cola, MD  Service setting: Ambulatory outpatient  Specialty: Interventional Pain Management  Location: [facility] (AMB) Pain Management Facility    Patient type: Established   Primary Reason(s) for Visit: Encounter for evaluation before starting new chronic pain management plan of care (Level of risk: moderate) CC: Back Pain (low); Shoulder Pain (bilateral R>L); and Leg Pain (right)  HPI  [name] is a 84 y.o. year old, female patient, who comes today for a follow-up evaluation to review the test results and decide on a treatment plan. She has Dizziness; Essential hypertension, benign; History of frequent urinary tract infections; Hypercholesterolemia; Pulmonary hypertension ([place]); Headache; Loss of vision; Vision loss, left eye; Chest pain; DOE (dyspnea on exertion); Neck pain; Skin lesion of breast; Back pain; Elevated alkaline phosphatase level; Cough; Pain of left thumb; URI (upper respiratory infection); Frequent falls; Hyperglycemia; Health care maintenance; Vertigo; Hyperlipidemia; GERD (gastroesophageal reflux disease); Dementia ([place]); Shoulder pain; Abnormal levels of other serum enzymes; Bladder prolapse, female, acquired; Chronic headaches; Disorder of breast; Frequency of micturition; Major depressive disorder, single episode; Mixed stress and urge urinary incontinence; OAB (overactive bladder); Osteoarthritis; Pain of finger of left hand; Rectocele; Sleep disorder; Urethral prolapse; Vaginal atrophy; Dysuria; Anxiety disorder; Depression with anxiety; Other forms of dyspnea; Essential (primary) hypertension; Dorsalgia; Encounter for general adult medical examination without abnormal findings; Personal history of urinary infection; Pure hypercholesterolemia; 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 Her primarily concern today is the Back Pain (low); Shoulder Pain (bilateral R>L); and Leg Pain (right)  Pain Assessment: Location: Lower Back Radiating: legs Onset: More than a month ago Duration: Chronic pain Quality: Aching, Constant Severity: 7 /10 (subjective, self-reported pain score)  Note: Reported level is inconsistent with clinical observations. Clinically the patient looks like a 3/10 A 3/10 is viewed as "Moderate" and described as significantly interfering with activities of daily living (ADL). It becomes difficult to feed, bathe, get dressed, get on and off the toilet or to perform personal hygiene functions. Difficult to get in and out of bed or a chair without assistance. Very distracting. With effort, it can be ignored when deeply involved in activities. Information on the proper use of the pain scale provided to the patient today. When using our objective Pain Scale, levels between 6 and 10/10 are said to belong in an emergency room, as it progressively worsens from a 6/10, described as severely limiting, requiring emergency care not usually available at an outpatient pain management facility. At a 6/10 level, communication becomes difficult and requires great effort. Assistance to reach the emergency department may be required. Facial flushing and profuse sweating along with potentially dangerous increases in heart rate and blood pressure will be evident. Timing: Constant Modifying factors: Tramdaol, heat BP: (!) 142/66   HR: 67  [name] comes in today for a follow-up visit after her initial evaluation on 07/14/2018. Today we went over the results of her tests. These were explained in "  Layman's terms". During today's appointment we went over my diagnostic impression, as well as the proposed treatment plan.  According to the patient and her daughter her primary area of pain is in her shoulders (B) (R>L).  She admits that the right side is worse than the left.  She admits the pain goes down to her elbows and also into her shoulder blades.  She denies any numbness tingling in her arms.  She denies any previous surgery.  She has had interventional therapy years ago [employer]).  She denies any recent images.  [facility].  Her second area of pain is in her right knee.  He admits that this pain comes and goes.  She states that she is S/P right total knee replacement approximately 10 years ago by Dr. Mariel Craft Orthopedist.   Her third area of pain is in her hip (B) (R>L) the right is worse than the left. She denies and numbness or tingling. She has weakness and is unable to ambulate.   In considering the treatment plan options, [name] was reminded that I no longer take patients for medication management only. I asked her to let me know if she had no intention of taking advantage of the interventional therapies, so that we could make arrangements to provide this space to someone interested. I also made it clear that undergoing interventional therapies for the purpose of getting pain medications is very inappropriate on the part of a patient, and it will not be tolerated in this practice. This type of behavior would suggest true addiction and therefore it requires referral to an addiction specialist.   Further details on both, my assessment(s), as well as the proposed treatment plan, please see below.  Controlled Substance Pharmacotherapy  Assessment REMS (Risk Evaluation and Mitigation Strategy)  Analgesic: Tramadol 50 mg I tablet twice daily ( 06/29/18) Tramadol 100 mg  Highest recorded MME/day: 10 mg/day MME/day: 10 mg/day Pill Count: None expected due to no prior prescriptions written by our practice. Hart Rochester, RN  07/27/2018 12:10 PM  Sign at close encounter Safety precautions to be maintained throughout the outpatient stay will include: orient to surroundings, keep bed in low position, maintain call bell within reach at all times, provide assistance with transfer out of bed and ambulation.    Pharmacokinetics: Liberation and absorption (onset of action): WNL Distribution (time to peak effect): WNL Metabolism and excretion (duration of action): WNL         Pharmacodynamics: Desired effects: Analgesia: [name] reports >50% benefit. Functional ability: Patient reports that medication allows her to accomplish basic ADLs Clinically meaningful improvement in function (CMIF): Sustained CMIF goals met Perceived effectiveness: Described as relatively effective, allowing for increase in activities of daily living (ADL) Undesirable effects: Side-effects or Adverse reactions: None reported Monitoring: [place] PMP: Online review of the past 33-monthperiod previously conducted. Not applicable at this point since we have not taken over the patient's medication management yet. List of other Serum/Urine Drug Screening Test(s):  No results found for: AMPHSCRSER, BARBSCRSER, BENZOSCRSER, COCAINSCRSER, COCAINSCRNUR, PCPSCRSER, THCSCRSER, THCU, CANNABQUANT, [place] of all UDS test(s) done:  Lab Results  Component Value Date   SUMMARY FINAL 07/14/2018   Last UDS on record: Summary  Date Value Ref Range Status  07/14/2018 FINAL  Final    Comment:    ==================================================================== TOXASSURE COMP DRUG  ANALYSIS,UR ==================================================================== Test  Result       Flag       Units Drug Present and Declared for Prescription Verification   Tramadol                       >4902        EXPECTED   ng/mg creat   O-Desmethyltramadol            >4902        EXPECTED   ng/mg creat   N-Desmethyltramadol            >4902        EXPECTED   ng/mg creat    Source of tramadol is a prescription medication.    O-desmethyltramadol and N-desmethyltramadol are expected    metabolites of tramadol.   Sertraline                     PRESENT      EXPECTED   Desmethylsertraline            PRESENT      EXPECTED    Desmethylsertraline is an expected metabolite of sertraline. Drug Present not Declared for Prescription Verification   Gabapentin                     PRESENT      UNEXPECTED   Acetaminophen                  PRESENT      UNEXPECTED   Doxylamine                     PRESENT      UNEXPECTED   Lidocaine                      PRESENT      UNEXPECTED   Dextromethorphan               PRESENT      UNEXPECTED   Dextrorphan/Levorphanol        PRESENT      UNEXPECTED    Dextrorphan is an expected metabolite of dextromethorphan, an    over-the-counter or prescription cough suppressant. Dextrorphan    cannot be distinguished from the scheduled prescription    medication levorphanol by the method used for analysis. Drug Absent but Declared for Prescription Verification   Mirtazapine                    Not Detected UNEXPECTED   Salicylate                     Not Detected UNEXPECTED    Salicylate, as indicated in the declared medication list, is not    always detected even when used as directed. ==================================================================== Test                      Result    Flag   Units      Ref Range   Creatinine              102              mg/dL       >=20 ==================================================================== Declared Medications:  The flagging and interpretation on this report are based on the  following declared medications.  Unexpected results may arise from  inaccuracies in the declared medications.  **Note: The testing scope of  this panel includes these medications:  Mirtazapine (Remeron)  Sertraline (Zoloft)  Tramadol (Ultram)  **Note: The testing scope of this panel does not include small to  moderate amounts of these reported medications:  Salicylate (BC Powder)  **Note: The testing scope of this panel does not include following  reported medications:  Alendronate (Fosamax)  Atorvastatin (Lipitor)  Brimonidine Tartrate  Brinzolamide (Azopt)  Fluticasone (Flonase)  Hydrochlorothiazide (Microzide)  Latanoprost (Xalatan)  Lisinopril  Meclizine (Antivert)  Memantine (Namenda)  Multivitamin  Mupirocin (Bactroban)  Nitrofurantoin (Macrobid)  Omega-3 Fatty Acids  Omeprazole (Prilosec)  Oxybutynin (Oxytrol)  Rosuvastatin (Crestor)  Supplement  Supplement (Preservision)  Vitamin D ==================================================================== For clinical consultation, please call [phone]. ====================================================================    UDS interpretation: No unexpected findings.          Medication Assessment Form: Patient introduced to form today Treatment compliance: Treatment may start today if patient agrees with proposed plan. Evaluation of compliance is not applicable at this point Risk Assessment Profile: Aberrant behavior: See initial evaluations. None observed or detected today Comorbid factors increasing risk of overdose: See initial evaluation. No additional risks detected today Opioid risk tool (ORT) (Total Score): 3 Personal History of Substance Abuse (SUD-Substance use disorder):  Alcohol: Alcohol: Negative  Illegal Drugs: Illegal Drugs: Negative   ORT Risk Level calculation: Opioid Risk Interpretation: Low Risk Risk of substance use disorder (SUD): Low Opioid Risk Tool - 07/27/18 1210      Family History of Substance Abuse   Alcohol  Negative    Illegal Drugs  Negative    Rx Drugs  Negative      Personal History of Substance Abuse   Alcohol  Negative    Illegal Drugs  Negative      Age   Age between 64-45 years   No      History of Preadolescent Sexual Abuse   History of Preadolescent Sexual Abuse  Negative or Female      Psychological Disease   Psychological Disease  Positive   dementia   ADD  Negative    OCD  Negative    Bipolar  Negative    Schizophrenia  Negative    Depression  Positive      Total Score   Opioid Risk Tool Scoring  3    Opioid Risk Interpretation  Low Risk      ORT Scoring interpretation table:  Score <3 = Low Risk for SUD  Score between 4-7 = Moderate Risk for SUD  Score >8 = High Risk for Opioid Abuse   Risk Mitigation Strategies:  Patient opioid safety counseling: Completed today. Counseling provided to patient as per "Patient Counseling Document". Document signed by patient, attesting to counseling and understanding Patient-Prescriber Agreement (PPA): Obtained today.  Controlled substance notification to other providers: Written and sent today.  Pharmacologic Plan: Today we may be taking over the patient's pharmacological regimen. See below.             Laboratory Chemistry  Inflammation Markers (CRP: Acute Phase) (ESR: Chronic Phase) Lab Results  Component Value Date   CRP 8 07/14/2018   ESRSEDRATE 22 07/14/2018   LATICACIDVEN 0.77 03/04/2014                         Rheumatology Markers Lab Results  Component Value Date   RF <10 01/28/2014   ANA NEG 01/28/2014  Renal Function Markers Lab Results  Component Value Date   BUN 17 07/14/2018   CREATININE 1.00 07/14/2018   BCR 17 07/14/2018   GFRAA 57 (L) 07/14/2018   GFRNONAA 50 (L) 07/14/2018                              Hepatic Function Markers Lab Results  Component Value Date   AST 16 07/14/2018   ALT 18 10/18/2016   ALBUMIN 4.4 07/14/2018   ALKPHOS 101 07/14/2018                        Electrolytes Lab Results  Component Value Date   NA 142 07/14/2018   K 4.6 07/14/2018   CL 103 07/14/2018   CALCIUM 9.8 07/14/2018   MG 1.8 07/14/2018                        Neuropathy Markers Lab Results  Component Value Date   VITAMINB12 278 07/14/2018   HGBA1C 5.8 10/18/2016                        CNS Tests No results found.  Bone Pathology Markers Lab Results  Component Value Date   25OHVITD1 56 07/14/2018   25OHVITD2 <1.0 07/14/2018   25OHVITD3 56 07/14/2018                         Coagulation Parameters Lab Results  Component Value Date   INR 1.07 01/15/2016   LABPROT 14.1 01/15/2016   APTT 33 01/15/2016   PLT 270.0 05/31/2016   DDIMER 0.82 (H) 10/10/2013                        Cardiovascular Markers Lab Results  Component Value Date   TROPONINI <0.03 03/01/2016   HGB 13.4 05/31/2016   HCT 40.0 05/31/2016                         CA Markers No results found.  Note: Lab results reviewed.  Recent Diagnostic Imaging Review  Cervical Imaging: Cervical CT wo contrast:  Results for orders placed during the hospital encounter of 05/10/18  CT Cervical Spine Wo Contrast   Narrative CLINICAL DATA:  [place] backwards, struck posterior head. History of dementia, hypertension, hypercholesterolemia.  EXAM: CT HEAD WITHOUT CONTRAST  CT CERVICAL SPINE WITHOUT CONTRAST  TECHNIQUE: Multidetector CT imaging of the head and cervical spine was performed following the standard protocol without intravenous contrast. Multiplanar CT image reconstructions of the cervical spine were also generated.  COMPARISON:  MRI of the head January 15, 2016  FINDINGS: CT HEAD FINDINGS  BRAIN: No intraparenchymal hemorrhage, mass effect nor midline shift. Moderate parenchymal  brain volume loss, disproportion mesial temporal lobe atrophy. Patchy supratentorial white matter hypodensities less than expected for patient's age, though non-specific are most compatible with chronic small vessel ischemic disease. Punctate LEFT temporal calcification. No acute large vascular territory infarcts. No abnormal extra-axial fluid collections. Basal cisterns are patent.  VASCULAR: Moderate calcific atherosclerosis of the carotid siphons.  SKULL: No skull fracture. Moderate LEFT parieto-occipital scalp hematoma without subcutaneous gas or radiopaque foreign bodies.  SINUSES/ORBITS: Trace paranasal sinus mucosal thickening. Mastoid air cells are well aerated.The included ocular globes and orbital contents are non-suspicious. Status post bilateral ocular lens implants.  OTHER: None.  CT CERVICAL SPINE FINDINGS  ALIGNMENT: Straightened lordosis. Grade 1 C4-5, C5-6 and C7-T1 anterolisthesis. C2 through C4 facets fused on degenerative basis. Severe RIGHT C4-5 and C5-6 facet arthropathy. Moderate to severe C5-6 and C6-7 disc height loss with endplate spurring compatible with degenerative discs. No destructive bony lesions. C1-2 articulation maintained with mild arthropathy. Calcified longus coli insertion.  SKULL BASE AND VERTEBRAE: Cervical vertebral bodies and posterior elements are intact. Intervertebral disc heights preserved. No destructive bony lesions. C1-2 articulation maintained.  SOFT TISSUES AND SPINAL CANAL: Nonacute. Moderate calcific atherosclerosis. 1 cm LEFT thyroid nodule, no routine indicated follow-up. This follows ACR consensus guidelines: Managing Incidental Thyroid Nodules Detected on Imaging: White Paper of the ACR Incidental Thyroid Findings Committee. J Am Coll Radiol 2015; 12:143-150.  DISC LEVELS: Severe RIGHT C4-5, bilateral C5-6, bilateral C6-7 neural foraminal narrowing. Mild canal stenosis C6-7.  UPPER CHEST: Lung apices are  clear.  OTHER: None.  IMPRESSION: CT HEAD:  1. No acute intracranial process. Moderate LEFT posterior scalp hematoma. 2. Advanced mesial temporal lobe atrophy associated with neuro degenerative syndromes.  CT CERVICAL SPINE:  1. No fracture.  Multilevel spondylolisthesis on degenerative basis. 2. Severe C4-5 through C6-7 neural foraminal narrowing.  Aortic Atherosclerosis (ICD10-I70.0).   Electronically Signed   By: Elon Alas M.D.   On: 05/10/2018 17:09    Cervical DG 2-3 views:  Results for orders placed during the hospital encounter of 01/28/14  DG Cervical Spine 2 or 3 views   Narrative CLINICAL DATA:  Neck pain.  EXAM: CERVICAL SPINE - 2-3 VIEW  COMPARISON:  None.  FINDINGS: The prevertebral soft tissues are normal. The alignment is anatomic through T1. There is probable congenital incomplete segmentation at C3-4. There is moderate disc space loss with uncinate spurring at C5-6 and C6-7. Facet degenerative changes are present throughout the cervical spine. No acute osseous findings are apparent.  IMPRESSION: Multilevel cervical spondylosis as described. No acute osseous findings or malalignment demonstrated.   Electronically Signed   By: Camie Patience M.D.   On: 01/28/2014 14:06    Shoulder Imaging: Shoulder-R DG:  Results for orders placed during the hospital encounter of 07/14/18  DG Shoulder Right   Narrative CLINICAL DATA:  Chronic shoulder pain.  EXAM: RIGHT SHOULDER - 2+ VIEW  COMPARISON:  None.  FINDINGS: No acute fracture or dislocation. Severe glenohumeral joint space narrowing with large tumoral head osteophytes. Moderate acromioclavicular joint space narrowing with bony hypertrophy. Osteopenia. Soft tissues are unremarkable. The visualized right lung is clear.  IMPRESSION: 1. Severe glenohumeral and moderate acromioclavicular osteoarthritis.   Electronically Signed   By: Titus Dubin M.D.   On: 07/14/2018 17:37     Shoulder-L DG:  Results for orders placed during the hospital encounter of 07/14/18  DG Shoulder Left   Narrative CLINICAL DATA:  Chronic shoulder pain.  EXAM: LEFT SHOULDER - 2+ VIEW  COMPARISON:  None.  FINDINGS: No acute fracture or dislocation. Severe glenohumeral joint space narrowing with large humeral head osteophytes. Mild acromioclavicular joint space narrowing with bony hypertrophy. Osteopenia. Soft tissues are unremarkable. The visualized left lung is clear.  IMPRESSION: 1. Severe glenohumeral and mild acromioclavicular osteoarthritis.   Electronically Signed   By: Titus Dubin M.D.   On: 07/14/2018 17:39    Lumbosacral Imaging: Lumbar DG (Complete) 4+V:  Results for orders placed during the hospital encounter of 01/28/14  DG Lumbar Spine Complete   Narrative CLINICAL DATA:  Back pain.  No known injury.  EXAM: LUMBAR SPINE -  COMPLETE 4+ VIEW  COMPARISON:  None.  FINDINGS: There are 5 lumbar type vertebral bodies. At L4-5, there is advanced disc space loss with 8 mm of anterolisthesis due to bilateral facet disease. There is a minimal anterolisthesis at L3-4 and L5-S1. The alignment is otherwise normal aside from a mild convex left scoliosis. There is no evidence of acute fracture or pars defect. Cholecystectomy clips are noted.  IMPRESSION: Degenerative grade 1 anterolisthesis at L4-5 with mild convex left scoliosis. No acute osseous findings demonstrated.   Electronically Signed   By: Camie Patience M.D.   On: 01/28/2014 14:09    Hip Imaging: Hip-R DG 2-3 views:  Results for orders placed during the hospital encounter of 07/14/18  DG HIP UNILAT W OR W/O PELVIS 2-3 VIEWS RIGHT   Narrative CLINICAL DATA:  Chronic bilateral hip pain.  EXAM: DG HIP (WITH OR WITHOUT PELVIS) 2-3V LEFT; DG HIP (WITH OR WITHOUT PELVIS) 2-3V RIGHT  COMPARISON:  CT abdomen pelvis dated January 18, 2013.  FINDINGS: No acute fracture or dislocation. Old healed  fracture deformities of the left superior and inferior pubic rami. Mild bilateral hip joint space narrowing with small marginal osteophytes. Osteoarthritis of the left greater than right sacroiliac joints. Advanced lower lumbar facet arthropathy. Osteopenia. Soft tissues are unremarkable.  IMPRESSION: 1. Mild bilateral hip osteoarthritis.   Electronically Signed   By: Titus Dubin M.D.   On: 07/14/2018 17:36    Hip-L DG 2-3 views:  Results for orders placed during the hospital encounter of 07/14/18  DG HIP UNILAT W OR W/O PELVIS 2-3 VIEWS LEFT   Narrative CLINICAL DATA:  Chronic bilateral hip pain.  EXAM: DG HIP (WITH OR WITHOUT PELVIS) 2-3V LEFT; DG HIP (WITH OR WITHOUT PELVIS) 2-3V RIGHT  COMPARISON:  CT abdomen pelvis dated January 18, 2013.  FINDINGS: No acute fracture or dislocation. Old healed fracture deformities of the left superior and inferior pubic rami. Mild bilateral hip joint space narrowing with small marginal osteophytes. Osteoarthritis of the left greater than right sacroiliac joints. Advanced lower lumbar facet arthropathy. Osteopenia. Soft tissues are unremarkable.  IMPRESSION: 1. Mild bilateral hip osteoarthritis.   Electronically Signed   By: Titus Dubin M.D.   On: 07/14/2018 17:36    Knee Imaging: Knee-R DG 1-2 views:  Results for orders placed during the hospital encounter of 07/14/18  DG Knee 1-2 Views Right   Narrative CLINICAL DATA:  Chronic right knee pain.  EXAM: RIGHT KNEE - 1-2 VIEW  COMPARISON:  None.  FINDINGS: No acute fracture or dislocation. No significant joint effusion. Severe patellofemoral and mild medial and lateral compartment joint space narrowing. Chondrocalcinosis of the menisci. Tricompartmental osteophytes. Osteopenia. Soft tissues are unremarkable.  IMPRESSION: 1. Tricompartmental osteoarthritis, preferentially involving the patellofemoral compartment. Given chondrocalcinosis of the menisci, findings are  suspicious for CPPD arthropathy.   Electronically Signed   By: Titus Dubin M.D.   On: 07/14/2018 17:34    Complexity Note: Imaging results reviewed. Results shared with [name], using Layman's terms.                         Meds   Current Outpatient Medications:  .  alendronate (FOSAMAX) 70 MG tablet, Take 70 mg by mouth once a week. Take with a full glass of water on an empty stomach., Disp: , Rfl:  .  Aspirin-Salicylamide-Caffeine (BC HEADACHE POWDER PO), Take 1 packet by mouth daily as needed (pain/headache)., Disp: , Rfl:  .  atorvastatin (  LIPITOR) 40 MG tablet, TAKE 1 TABLET AT 6 PM, Disp: 90 tablet, Rfl: 1 .  brimonidine (ALPHAGAN P) 0.1 % SOLN, Place 1 drop into both eyes 2 (two) times daily., Disp: , Rfl:  .  brinzolamide (AZOPT) 1 % ophthalmic suspension, Place 1 drop into the left eye 3 (three) times daily., Disp: 10 mL, Rfl: 12 .  Calcium Polycarbophil (FIBER-LAX PO), Take by mouth., Disp: , Rfl:  .  fluticasone (FLONASE) 50 MCG/ACT nasal spray, Place 2 sprays into both nostrils daily. (Patient taking differently: Place 2 sprays into both nostrils daily as needed (seasonal allergies). ), Disp: 16 g, Rfl: 1 .  hydrochlorothiazide (MICROZIDE) 12.5 MG capsule, Take 1 capsule (12.5 mg total) by mouth daily., Disp: 30 capsule, Rfl: 3 .  latanoprost (XALATAN) 0.005 % ophthalmic solution, Place 1 drop into both eyes at bedtime., Disp: 2.5 mL, Rfl: 12 .  lisinopril (PRINIVIL,ZESTRIL) 10 MG tablet, Take 1 tablet (10 mg total) by mouth daily., Disp: 30 tablet, Rfl: 5 .  meclizine (ANTIVERT) 25 MG tablet, Take 1 tablet (25 mg total) by mouth 3 (three) times daily as needed for dizziness., Disp: 90 tablet, Rfl: 3 .  Melatonin-Pyridoxine (MELATIN PO), Take 3 mg by mouth., Disp: , Rfl:  .  memantine (NAMENDA) 10 MG tablet, Take 10 mg by mouth 2 (two) times daily. , Disp: , Rfl:  .  mirtazapine (REMERON) 30 MG tablet, Take 30 mg by mouth at bedtime., Disp: , Rfl:  .  Multiple Vitamin  (MULTIVITAMIN WITH MINERALS) TABS tablet, Take 1 tablet by mouth daily., Disp: , Rfl:  .  Multiple Vitamins-Minerals (PRESERVISION AREDS) TABS, Take 1 tablet by mouth daily. , Disp: , Rfl:  .  mupirocin ointment (BACTROBAN) 2 %, Apply to affected area on chest and scalp bid x 1 week., Disp: 22 g, Rfl: 0 .  nitrofurantoin, macrocrystal-monohydrate, (MACROBID) 100 MG capsule, Take 1 capsule (100 mg total) by mouth every 12 (twelve) hours., Disp: 14 capsule, Rfl: 0 .  Omega-3 Fatty Acids (FISH OIL CONCENTRATE PO), Take 1 capsule by mouth daily. , Disp: , Rfl:  .  omeprazole (PRILOSEC) 20 MG capsule, Take 1 capsule (20 mg total) by mouth every morning., Disp: 90 capsule, Rfl: 1 .  oxybutynin (OXYTROL) 3.9 MG/24HR, Place 1 patch onto the skin every 3 (three) days., Disp: 8 patch, Rfl: 0 .  rosuvastatin (CRESTOR) 20 MG tablet, Take 1 tablet (20 mg total) by mouth at bedtime., Disp: 30 tablet, Rfl: 3 .  sertraline (ZOLOFT) 100 MG tablet, Take 150 mg by mouth daily. , Disp: , Rfl:  .  SERTRALINE HCL PO, Take 100 mg by mouth daily., Disp: , Rfl:  .  traMADol (ULTRAM) 50 MG tablet, Take 1 tablet (50 mg total) by mouth 2 (two) times daily. Must last 30 days., Disp: 60 tablet, Rfl: 0 .  travoprost, benzalkonium, (TRAVATAN) 0.004 % ophthalmic solution, Place 1 drop into both eyes at bedtime., Disp: , Rfl:  .  vitamin D, CHOLECALCIFEROL, 400 UNITS tablet, Take 400 Units by mouth daily., Disp: , Rfl:   ROS  Constitutional: Denies any fever or chills Gastrointestinal: No reported hemesis, hematochezia, vomiting, or acute GI distress Musculoskeletal: Denies any acute onset joint swelling, redness, loss of ROM, or weakness Neurological: No reported episodes of acute onset apraxia, aphasia, dysarthria, agnosia, amnesia, paralysis, loss of coordination, or loss of consciousness  Allergies  [name] is allergic to [employer] tartrate] and codeine.  [place]  Drug: [name]  reports that she  does not use  drugs. Alcohol:  reports that she does not drink alcohol. Tobacco:  reports that she has never smoked. She has never used smokeless tobacco. Medical:  has a past medical history of Anxiety, Arthritis, Cancer ([place]), Chronic headaches, Cystocele, Dementia ([place]), Depression, GERD (gastroesophageal reflux disease), Glaucoma, Hypercholesterolemia, Hypertension, Macular degeneration, Reactive airway disease, Rectocele, Right bundle branch block, Sleep apnea, Uterine cancer ([place]), Vertigo, and Wears dentures. Surgical: [name]  has a past surgical history that includes Cholecystectomy; Appendectomy; Abdominal hysterectomy; Vulvectomy; Replacement total knee (Left); Image guided sinus surgery (N/A, 06/20/2015); Sphenoidectomy (Bilateral, 06/20/2015); Abdominal hysterectomy; and uterine cancer. Family: family history includes Cancer in her daughter and father; Heart disease in her brother and mother.  Constitutional Exam  General appearance: Well nourished, well developed, and well hydrated. In no apparent acute distress Vitals:   07/27/18 1201  BP: (!) 142/66  Pulse: 67  Resp: 18  Temp: 98.4 F (36.9 C)  TempSrc: Oral  SpO2: 97%  Weight: 164 lb (74.4 kg)  Height: '5\' 2"'$  (1.575 m)   BMI Assessment: Estimated body mass index is 30 kg/m as calculated from the following:   Height as of this encounter: '5\' 2"'$  (1.575 m).   Weight as of this encounter: 164 lb (74.4 kg).  BMI interpretation table: BMI level Category Range association with higher incidence of chronic pain  <18 kg/m2 Underweight   18.5-24.9 kg/m2 Ideal body weight   25-29.9 kg/m2 Overweight Increased incidence by 20%  30-34.9 kg/m2 Obese (Class I) Increased incidence by 68%  35-39.9 kg/m2 Severe obesity (Class II) Increased incidence by 136%  >40 kg/m2 Extreme obesity (Class III) Increased incidence by 254%   Patient's current BMI Ideal Body weight  Body mass index is 30 kg/m. Ideal body weight: 50.1 kg (110 lb 7.2 oz) Adjusted  ideal body weight: 59.8 kg (131 lb 13.9 oz)   BMI Readings from Last 4 Encounters:  07/27/18 30.00 kg/m  07/14/18 27.81 kg/m  05/10/18 26.78 kg/m  01/23/17 29.65 kg/m   Wt Readings from Last 4 Encounters:  07/27/18 164 lb (74.4 kg)  07/14/18 162 lb (73.5 kg)  05/10/18 156 lb (70.8 kg)  01/23/17 167 lb 6.4 oz (75.9 kg)  Psych/Mental status: Alert, oriented x 3 (person, place, & time)       Eyes: PERLA Respiratory: No evidence of acute respiratory distress  Cervical Spine Area Exam  Skin & Axial Inspection: No masses, redness, edema, swelling, or associated skin lesions Alignment: Symmetrical Functional ROM: Decreased ROM      Stability: No instability detected Muscle Tone/Strength: Guarding observed Sensory (Neurological): Movement-associated pain Palpation: Complains of area being tender to palpation              Upper Extremity (UE) Exam    Side: Right upper extremity  Side: Left upper extremity  Skin & Extremity Inspection: Skin color, temperature, and hair growth are WNL. No peripheral edema or cyanosis. No masses, redness, swelling, asymmetry, or associated skin lesions. No contractures.  Skin & Extremity Inspection: Skin color, temperature, and hair growth are WNL. No peripheral edema or cyanosis. No masses, redness, swelling, asymmetry, or associated skin lesions. No contractures.  Functional ROM: Unrestricted ROM          Functional ROM: Unrestricted ROM          Muscle Tone/Strength: Functionally intact. No obvious neuro-muscular anomalies detected.  Muscle Tone/Strength: Functionally intact. No obvious neuro-muscular anomalies detected.  Sensory (Neurological): Unimpaired  Sensory (Neurological): Unimpaired          Palpation: No palpable anomalies              Palpation: No palpable anomalies              Provocative Test(s):  Phalen's test: deferred Tinel's test: deferred Apley's scratch test (touch opposite shoulder):  Action 1 (Across chest):  deferred Action 2 (Overhead): deferred Action 3 (LB reach): deferred   Provocative Test(s):  Phalen's test: deferred Tinel's test: deferred Apley's scratch test (touch opposite shoulder):  Action 1 (Across chest): deferred Action 2 (Overhead): deferred Action 3 (LB reach): deferred    Thoracic Spine Area Exam  Skin & Axial Inspection: No masses, redness, or swelling Alignment: Symmetrical Functional ROM: Unrestricted ROM Stability: No instability detected Muscle Tone/Strength: Functionally intact. No obvious neuro-muscular anomalies detected. Sensory (Neurological): Unimpaired Muscle strength & Tone: No palpable anomalies  Lumbar Spine Area Exam  Skin & Axial Inspection: No masses, redness, or swelling Alignment: Symmetrical Functional ROM: Unrestricted ROM       Stability: No instability detected Muscle Tone/Strength: Functionally intact. No obvious neuro-muscular anomalies detected. Sensory (Neurological): Unimpaired Palpation: No palpable anomalies       Provocative Tests: Hyperextension/rotation test: deferred today       Lumbar quadrant test (Kemp's test): deferred today       Lateral bending test: deferred today       Patrick's Maneuver: deferred today                   FABER test: deferred today                   S-I anterior distraction/compression test: deferred today         S-I lateral compression test: deferred today         S-I Thigh-thrust test: deferred today         S-I Gaenslen's test: deferred today          Gait & Posture Assessment  Ambulation: Patient ambulates using a wheel chair Gait: Significantly limited. Dependent on assistive device to ambulate Posture: Difficulty with positional changes   Lower Extremity Exam    Side: Right lower extremity  Side: Left lower extremity  Stability: No instability observed          Stability: No instability observed          Skin & Extremity Inspection: Skin color, temperature, and hair growth are WNL. No  peripheral edema or cyanosis. No masses, redness, swelling, asymmetry, or associated skin lesions. No contractures.  Skin & Extremity Inspection: Skin color, temperature, and hair growth are WNL. No peripheral edema or cyanosis. No masses, redness, swelling, asymmetry, or associated skin lesions. No contractures.  Functional ROM: Unrestricted ROM                  Functional ROM: Unrestricted ROM                  Muscle Tone/Strength: Functionally intact. No obvious neuro-muscular anomalies detected.  Muscle Tone/Strength: Functionally intact. No obvious neuro-muscular anomalies detected.  Sensory (Neurological): Unimpaired  Sensory (Neurological): Unimpaired  Palpation: No palpable anomalies  Palpation: No palpable anomalies   Assessment & Plan  Primary Diagnosis & Pertinent Problem List: The primary encounter diagnosis was Chronic pain syndrome. Diagnoses of Chronic shoulder pain (Primary Area of Pain) (Bilateral) (R>L), Chronic knee pain (Secondary Area of Pain) (Right), Chronic hip pain (Tertiary Area of Pain) (Bilateral) (R>L), Hx  of total knee replacement (Left), and DDD (degenerative disc disease), cervical were also pertinent to this visit.  Visit Diagnosis: 1. Chronic pain syndrome   2. Chronic shoulder pain (Primary Area of Pain) (Bilateral) (R>L)   3. Chronic knee pain (Secondary Area of Pain) (Right)   4. Chronic hip pain (Tertiary Area of Pain) (Bilateral) (R>L)   5. Hx of total knee replacement (Left)   6. DDD (degenerative disc disease), cervical    Problems updated and reviewed during this visit: No problems updated.   Time Note: Greater than 50% of the 40 minute(s) of face-to-face time spent with [name], was spent in counseling/coordination of care regarding: the appropriate use of the pain scale, [name] primary cause of pain, the results of her recent test(s), the significance of each one oth the test(s) anomalies and it's corresponding characteristic pain  pattern(s), the treatment plan, treatment alternatives, the risks and possible complications of proposed treatment, realistic expectations, the goals of pain management (increased in functionality) and the need to collect and read the AVS material.  Plan of Care  Pharmacotherapy (Medications Ordered): Meds ordered this encounter  Medications  . traMADol (ULTRAM) 50 MG tablet    Sig: Take 1 tablet (50 mg total) by mouth 2 (two) times daily. Must last 30 days.    Dispense:  60 tablet    Refill:  0    [place] STOP ACT - Not applicable. Fill one day early if pharmacy is closed on scheduled refill date. Must last 30 days.    Procedure Orders     Cervical Epidural Injection Lab Orders  No laboratory test(s) ordered today   Imaging Orders  No imaging studies ordered today   Referral Orders  No referral(s) requested today    Pharmacological management options:  Opioid Analgesics: We'll take over management today. See above orders Membrane stabilizer: We have discussed the possibility of optimizing this mode of therapy, if tolerated Muscle relaxant: We have discussed the possibility of a trial NSAID: We have discussed the possibility of a trial Other analgesic(s): To be determined at a later time   Interventional management options: Planned, scheduled, and/or pending:    Diagnostic ML CESI #1 under fluoroscopy, no sedation.   Considering:   Diagnostic bilateral intra-articular shoulder injections  Diagnostic bilateral suprascapular nerve blocks  Possible bilateral suprascapular RFA  Diagnostic right intra-articular knee injection  Diagnostic right knee Hyalgan series  Diagnostic right genicular nerve block  Possible right genicular nerve RFA     PRN Procedures:   None at this time   Provider-requested follow-up: Return for Procedure (no sedation): (ML) CESI #1.  Future Appointments  Date Time Provider [place]  08/13/2018 10:15 AM Milinda Pointer, MD [facility] None     Primary Care Physician: System, Pcp Not In Location: [facility] Note by: Gaspar Cola, MD Date: 07/27/2018; Time: 12:42 PM

## 2018-07-27 ENCOUNTER — Encounter: Payer: Self-pay | Admitting: Pain Medicine

## 2018-07-27 ENCOUNTER — Ambulatory Visit: Payer: Medicare (Managed Care) | Attending: Pain Medicine | Admitting: Pain Medicine

## 2018-07-27 ENCOUNTER — Other Ambulatory Visit: Payer: Self-pay

## 2018-07-27 VITALS — BP 142/66 | HR 67 | Temp 98.4°F | Resp 18 | Ht 62.0 in | Wt 164.0 lb

## 2018-07-27 DIAGNOSIS — G894 Chronic pain syndrome: Secondary | ICD-10-CM | POA: Insufficient documentation

## 2018-07-27 DIAGNOSIS — M25551 Pain in right hip: Secondary | ICD-10-CM | POA: Insufficient documentation

## 2018-07-27 DIAGNOSIS — F419 Anxiety disorder, unspecified: Secondary | ICD-10-CM | POA: Insufficient documentation

## 2018-07-27 DIAGNOSIS — I4510 Unspecified right bundle-branch block: Secondary | ICD-10-CM | POA: Diagnosis not present

## 2018-07-27 DIAGNOSIS — E785 Hyperlipidemia, unspecified: Secondary | ICD-10-CM | POA: Diagnosis not present

## 2018-07-27 DIAGNOSIS — M25561 Pain in right knee: Secondary | ICD-10-CM | POA: Diagnosis not present

## 2018-07-27 DIAGNOSIS — I10 Essential (primary) hypertension: Secondary | ICD-10-CM | POA: Diagnosis not present

## 2018-07-27 DIAGNOSIS — M25511 Pain in right shoulder: Secondary | ICD-10-CM | POA: Insufficient documentation

## 2018-07-27 DIAGNOSIS — H409 Unspecified glaucoma: Secondary | ICD-10-CM | POA: Diagnosis not present

## 2018-07-27 DIAGNOSIS — F329 Major depressive disorder, single episode, unspecified: Secondary | ICD-10-CM | POA: Diagnosis not present

## 2018-07-27 DIAGNOSIS — N8110 Cystocele, unspecified: Secondary | ICD-10-CM | POA: Insufficient documentation

## 2018-07-27 DIAGNOSIS — Z8542 Personal history of malignant neoplasm of other parts of uterus: Secondary | ICD-10-CM | POA: Diagnosis not present

## 2018-07-27 DIAGNOSIS — Z96652 Presence of left artificial knee joint: Secondary | ICD-10-CM | POA: Insufficient documentation

## 2018-07-27 DIAGNOSIS — K219 Gastro-esophageal reflux disease without esophagitis: Secondary | ICD-10-CM | POA: Diagnosis not present

## 2018-07-27 DIAGNOSIS — M25512 Pain in left shoulder: Secondary | ICD-10-CM | POA: Insufficient documentation

## 2018-07-27 DIAGNOSIS — I272 Pulmonary hypertension, unspecified: Secondary | ICD-10-CM | POA: Insufficient documentation

## 2018-07-27 DIAGNOSIS — F0390 Unspecified dementia without behavioral disturbance: Secondary | ICD-10-CM | POA: Diagnosis not present

## 2018-07-27 DIAGNOSIS — M25552 Pain in left hip: Secondary | ICD-10-CM

## 2018-07-27 DIAGNOSIS — H5462 Unqualified visual loss, left eye, normal vision right eye: Secondary | ICD-10-CM | POA: Insufficient documentation

## 2018-07-27 DIAGNOSIS — H3530 Unspecified macular degeneration: Secondary | ICD-10-CM | POA: Insufficient documentation

## 2018-07-27 DIAGNOSIS — E7800 Pure hypercholesterolemia, unspecified: Secondary | ICD-10-CM | POA: Diagnosis not present

## 2018-07-27 DIAGNOSIS — M545 Low back pain: Secondary | ICD-10-CM | POA: Diagnosis present

## 2018-07-27 DIAGNOSIS — G8929 Other chronic pain: Secondary | ICD-10-CM

## 2018-07-27 DIAGNOSIS — M5030 Other cervical disc degeneration, unspecified cervical region: Secondary | ICD-10-CM | POA: Diagnosis not present

## 2018-07-27 MED ORDER — TRAMADOL HCL 50 MG PO TABS: 50.0000 mg | ORAL_TABLET | Freq: Two times a day (BID) | ORAL | 0 refills | Status: DC

## 2018-07-27 NOTE — Progress Notes (Signed)
Safety precautions to be maintained throughout the outpatient stay will include: orient to surroundings, keep bed in low position, maintain call bell within reach at all times, provide assistance with transfer out of bed and ambulation.  

## 2018-07-27 NOTE — Patient Instructions (Addendum)
____________________________________________________________________________________________  Pain Scale  Introduction: The pain score used by this practice is the Verbal Numerical Rating Scale (VNRS-11). This is an 11-point scale. It is for adults and children 10 years or older. There are significant differences in how the pain score is reported, used, and applied. Forget everything you learned in the past and learn this scoring system.  General Information: The scale should reflect your current level of pain. Unless you are specifically asked for the level of your worst pain, or your average pain. If you are asked for one of these two, then it should be understood that it is over the past 24 hours.  Basic Activities of Daily Living (ADL): Personal hygiene, dressing, eating, transferring, and using restroom.  Instructions: Most patients tend to report their level of pain as a combination of two factors, their physical pain and their psychosocial pain. This last one is also known as "suffering" and it is reflection of how physical pain affects you socially and psychologically. From now on, report them separately. From this point on, when asked to report your pain level, report only your physical pain. Use the following table for reference.  Pain Clinic Pain Levels (0-5/10)  Pain Level Score  Description  No Pain 0   Mild pain 1 Nagging, annoying, but does not interfere with basic activities of daily living (ADL). Patients are able to eat, bathe, get dressed, toileting (being able to get on and off the toilet and perform personal hygiene functions), transfer (move in and out of bed or a chair without assistance), and maintain continence (able to control bladder and bowel functions). Blood pressure and heart rate are unaffected. A normal heart rate for a healthy adult ranges from 60 to 100 bpm (beats per minute).   Mild to moderate pain 2 Noticeable and distracting. Impossible to hide from other  people. More frequent flare-ups. Still possible to adapt and function close to normal. It can be very annoying and may have occasional stronger flare-ups. With discipline, patients may get used to it and adapt.   Moderate pain 3 Interferes significantly with activities of daily living (ADL). It becomes difficult to feed, bathe, get dressed, get on and off the toilet or to perform personal hygiene functions. Difficult to get in and out of bed or a chair without assistance. Very distracting. With effort, it can be ignored when deeply involved in activities.   Moderately severe pain 4 Impossible to ignore for more than a few minutes. With effort, patients may still be able to manage work or participate in some social activities. Very difficult to concentrate. Signs of autonomic nervous system discharge are evident: dilated pupils (mydriasis); mild sweating (diaphoresis); sleep interference. Heart rate becomes elevated (>115 bpm). Diastolic blood pressure (lower number) rises above 100 mmHg. Patients find relief in laying down and not moving.   Severe pain 5 Intense and extremely unpleasant. Associated with frowning face and frequent crying. Pain overwhelms the senses.  Ability to do any activity or maintain social relationships becomes significantly limited. Conversation becomes difficult. Pacing back and forth is common, as getting into a comfortable position is nearly impossible. Pain wakes you up from deep sleep. Physical signs will be obvious: pupillary dilation; increased sweating; goosebumps; brisk reflexes; cold, clammy hands and feet; nausea, vomiting or dry heaves; loss of appetite; significant sleep disturbance with inability to fall asleep or to remain asleep. When persistent, significant weight loss is observed due to the complete loss of appetite and sleep deprivation.  Blood   pressure and heart rate becomes significantly elevated. Caution: If elevated blood pressure triggers a pounding headache  associated with blurred vision, then the patient should immediately seek attention at an urgent or emergency care unit, as these may be signs of an impending stroke.    Emergency Department Pain Levels (6-10/10)  Emergency Room Pain 6 Severely limiting. Requires emergency care and should not be seen or managed at an outpatient pain management facility. Communication becomes difficult and requires great effort. Assistance to reach the emergency department may be required. Facial flushing and profuse sweating along with potentially dangerous increases in heart rate and blood pressure will be evident.   Distressing pain 7 Self-care is very difficult. Assistance is required to transport, or use restroom. Assistance to reach the emergency department will be required. Tasks requiring coordination, such as bathing and getting dressed become very difficult.   Disabling pain 8 Self-care is no longer possible. At this level, pain is disabling. The individual is unable to do even the most "basic" activities such as walking, eating, bathing, dressing, transferring to a bed, or toileting. Fine motor skills are lost. It is difficult to think clearly.   Incapacitating pain 9 Pain becomes incapacitating. Thought processing is no longer possible. Difficult to remember your own name. Control of movement and coordination are lost.   The worst pain imaginable 10 At this level, most patients pass out from pain. When this level is reached, collapse of the autonomic nervous system occurs, leading to a sudden drop in blood pressure and heart rate. This in turn results in a temporary and dramatic drop in blood flow to the brain, leading to a loss of consciousness. Fainting is one of the body's self defense mechanisms. Passing out puts the brain in a calmed state and causes it to shut down for a while, in order to begin the healing process.    Summary: 1. Refer to this scale when providing [place] with your pain level. 2. Be  accurate and careful when reporting your pain level. This will help with your care. 3. Over-reporting your pain level will lead to loss of credibility. 4. Even a level of 1/10 means that there is pain and will be treated at our facility. 5. High, inaccurate reporting will be documented as "Symptom Exaggeration", leading to loss of credibility and suspicions of possible secondary gains such as obtaining more narcotics, or wanting to appear disabled, for fraudulent reasons. 6. Only pain levels of 5 or below will be seen at our facility. 7. Pain levels of 6 and above will be sent to the Emergency Department and the appointment cancelled. ____________________________________________________________________________________________   ____________________________________________________________________________________________  Medication Rules  Applies to: All patients receiving prescriptions (written or electronic).  Pharmacy of record: Pharmacy where electronic prescriptions will be sent. If written prescriptions are taken to a different pharmacy, please inform the nursing staff. The pharmacy listed in the electronic medical record should be the one where you would like electronic prescriptions to be sent.  Prescription refills: Only during scheduled appointments. Applies to both, written and electronic prescriptions.  NOTE: The following applies primarily to controlled substances (Opioid* Pain Medications).   Patient's responsibilities: 1. Pain Pills: Bring all pain pills to every appointment (except for procedure appointments). 2. Pill Bottles: Bring pills in original pharmacy bottle. Always bring newest bottle. Bring bottle, even if empty. 3. Medication refills: You are responsible for knowing and keeping track of what medications you need refilled. The day before your appointment, write a list of all  prescriptions that need to be refilled. Bring that list to your appointment and give it to the  admitting nurse. Prescriptions will be written only during appointments. If you forget a medication, it will not be "Called in", "Faxed", or "electronically sent". You will need to get another appointment to get these prescribed. 4. Prescription Accuracy: You are responsible for carefully inspecting your prescriptions before leaving our office. Have the discharge nurse carefully go over each prescription with you, before taking them home. Make sure that your name is accurately spelled, that your address is correct. Check the name and dose of your medication to make sure it is accurate. Check the number of pills, and the written instructions to make sure they are clear and accurate. Make sure that you are given enough medication to last until your next medication refill appointment. 5. Taking Medication: Take medication as prescribed. Never take more pills than instructed. Never take medication more frequently than prescribed. Taking less pills or less frequently is permitted and encouraged, when it comes to controlled substances (written prescriptions).  6. Inform other Doctors: Always inform, all of your healthcare providers, of all the medications you take. 7. Pain Medication from other Providers: You are not allowed to accept any additional pain medication from any other Doctor or Healthcare provider. There are two exceptions to this rule. (see below) In the event that you require additional pain medication, you are responsible for notifying us, as stated below. 8. Medication Agreement: You are responsible for carefully reading and following our Medication Agreement. This must be signed before receiving any prescriptions from our practice. Safely store a copy of your signed Agreement. Violations to the Agreement will result in no further prescriptions. (Additional copies of our Medication Agreement are available upon request.) 9. Laws, Rules, & Regulations: All patients are expected to follow all Federal  and [employer], Rules, [employer]. Ignorance of the Laws does not constitute a valid excuse. The use of any illegal substances is prohibited. 10. Adopted CDC guidelines & recommendations: Target dosing levels will be at or below 60 MME/day. Use of benzodiazepines** is not recommended.  Exceptions: There are only two exceptions to the rule of not receiving pain medications from other Healthcare Providers. 1. Exception #1 (Emergencies): In the event of an emergency (i.e.: accident requiring emergency care), you are allowed to receive additional pain medication. However, you are responsible for: As soon as you are able, call our office [phone], at any time of the day or night, and leave a message stating your name, the date and nature of the emergency, and the name and dose of the medication prescribed. In the event that your call is answered by a member of our staff, make sure to document and save the date, time, and the name of the person that took your information.  2. Exception #2 (Planned Surgery): In the event that you are scheduled by another doctor or dentist to have any type of surgery or procedure, you are allowed (for a period no longer than 30 days), to receive additional pain medication, for the acute post-op pain. However, in this case, you are responsible for picking up a copy of our "Post-op Pain Management for Surgeons" handout, and giving it to your surgeon or dentist. This document is available at our office, and does not require an appointment to obtain it. Simply go to our office during business hours (Monday-Thursday from 8:00 AM to 4:00 PM) (Friday 8:00 AM to 12:00 Noon) or if you  have a scheduled appointment with [place], prior to your surgery, and ask for it by name. In addition, you will need to provide [place] with your name, name of your surgeon, type of surgery, and date of procedure or surgery.  *Opioid medications include: morphine, codeine, oxycodone, oxymorphone,  hydrocodone, hydromorphone, meperidine, tramadol, tapentadol, buprenorphine, fentanyl, methadone. **Benzodiazepine medications include: diazepam (Valium), alprazolam (Xanax), clonazepam (Klonopine), lorazepam (Ativan), clorazepate (Tranxene), chlordiazepoxide (Librium), estazolam (Prosom), oxazepam (Serax), temazepam (Restoril), triazolam (Halcion) (Last updated: 12/04/2017) ____________________________________________________________________________________________   ____________________________________________________________________________________________  Medication Recommendations and Reminders  Applies to: All patients receiving prescriptions (written and/or electronic).  Medication Rules & Regulations: These rules and regulations exist for your safety and that of others. They are not flexible and neither are we. Dismissing or ignoring them will be considered "non-compliance" with medication therapy, resulting in complete and irreversible termination of such therapy. (See document titled "Medication Rules" for more details.) In all conscience, because of safety reasons, we cannot continue providing a therapy where the patient does not follow instructions.  Pharmacy of record:   Definition: This is the pharmacy where your electronic prescriptions will be sent.   We do not endorse any particular pharmacy.  You are not restricted in your choice of pharmacy.  The pharmacy listed in the electronic medical record should be the one where you want electronic prescriptions to be sent.  If you choose to change pharmacy, simply notify our nursing staff of your choice of new pharmacy.  Recommendations:  Keep all of your pain medications in a safe place, under lock and key, even if you live alone.   After you fill your prescription, take 1 week's worth of pills and put them away in a safe place. You should keep a separate, properly labeled bottle for this purpose. The remainder should be kept  in the original bottle. Use this as your primary supply, until it runs out. Once it's gone, then you know that you have 1 week's worth of medicine, and it is time to come in for a prescription refill. If you do this correctly, it is unlikely that you will ever run out of medicine.  To make sure that the above recommendation works, it is very important that you make sure your medication refill appointments are scheduled at least 1 week before you run out of medicine. To do this in an effective manner, make sure that you do not leave the office without scheduling your next medication management appointment. Always ask the nursing staff to show you in your prescription , when your medication will be running out. Then arrange for the receptionist to get you a return appointment, at least 7 days before you run out of medicine. Do not wait until you have 1 or 2 pills left, to come in. This is very poor planning and does not take into consideration that we may need to cancel appointments due to bad weather, sickness, or emergencies affecting our staff.  "Partial Fill": If for any reason your pharmacy does not have enough pills/tablets to completely fill or refill your prescription, do not allow for a "partial fill". You will need a separate prescription to fill the remaining amount, which we will not provide. If the reason for the partial fill is your insurance, you will need to talk to the pharmacist about payment alternatives for the remaining tablets, but again, do not accept a partial fill.  Prescription refills and/or changes in medication(s):   Prescription refills, and/or changes in dose or medication, will be  conducted only during scheduled medication management appointments. (Applies to both, written and electronic prescriptions.)  No refills on procedure days. No medication will be changed or started on procedure days. No changes, adjustments, and/or refills will be conducted on a procedure day. Doing  so will interfere with the diagnostic portion of the procedure.  No phone refills. No medications will be "called into the pharmacy".  No Fax refills.  No weekend refills.  No Holliday refills.  No after hours refills.  Remember:  Business hours are:  Monday to Thursday 8:00 AM to 4:00 PM Provider's Schedule: Dionisio David, NP - Appointments are:  Medication management: Monday to Thursday 8:00 AM to 4:00 PM Milinda Pointer, MD - Appointments are:  Medication management: Monday and Wednesday 8:00 AM to 4:00 PM Procedure day: Tuesday and Thursday 7:30 AM to 4:00 PM Gillis Santa, MD - Appointments are:  Medication management: Tuesday and Thursday 8:00 AM to 4:00 PM Procedure day: Monday and Wednesday 7:30 AM to 4:00 PM (Last update: 12/04/2017) ____________________________________________________________________________________________ Dennis Bast were given one prescription for Tramadol today.

## 2018-08-13 ENCOUNTER — Other Ambulatory Visit: Payer: Self-pay

## 2018-08-13 ENCOUNTER — Encounter: Payer: Self-pay | Admitting: Pain Medicine

## 2018-08-13 ENCOUNTER — Ambulatory Visit (HOSPITAL_BASED_OUTPATIENT_CLINIC_OR_DEPARTMENT_OTHER): Payer: Medicare (Managed Care) | Admitting: Pain Medicine

## 2018-08-13 ENCOUNTER — Ambulatory Visit
Admission: RE | Admit: 2018-08-13 | Discharge: 2018-08-13 | Disposition: A | Discharge status: Home / Self Care | Payer: Medicare (Managed Care) | Source: Ambulatory Visit | Attending: Pain Medicine | Admitting: Pain Medicine

## 2018-08-13 VITALS — BP 146/70 | HR 76 | Temp 98.1°F | Resp 20 | Ht 64.0 in | Wt 162.0 lb

## 2018-08-13 DIAGNOSIS — M4312 Spondylolisthesis, cervical region: Secondary | ICD-10-CM | POA: Diagnosis not present

## 2018-08-13 DIAGNOSIS — M4313 Spondylolisthesis, cervicothoracic region: Secondary | ICD-10-CM | POA: Diagnosis not present

## 2018-08-13 DIAGNOSIS — R51 Headache: Secondary | ICD-10-CM

## 2018-08-13 DIAGNOSIS — M4722 Other spondylosis with radiculopathy, cervical region: Secondary | ICD-10-CM | POA: Insufficient documentation

## 2018-08-13 DIAGNOSIS — M4802 Spinal stenosis, cervical region: Secondary | ICD-10-CM | POA: Insufficient documentation

## 2018-08-13 DIAGNOSIS — M542 Cervicalgia: Secondary | ICD-10-CM

## 2018-08-13 DIAGNOSIS — M47812 Spondylosis without myelopathy or radiculopathy, cervical region: Secondary | ICD-10-CM | POA: Insufficient documentation

## 2018-08-13 DIAGNOSIS — G4486 Cervicogenic headache: Secondary | ICD-10-CM

## 2018-08-13 DIAGNOSIS — M5010 Cervical disc disorder with radiculopathy, unspecified cervical region: Secondary | ICD-10-CM | POA: Insufficient documentation

## 2018-08-13 DIAGNOSIS — M5030 Other cervical disc degeneration, unspecified cervical region: Secondary | ICD-10-CM | POA: Diagnosis not present

## 2018-08-13 DIAGNOSIS — M4310 Spondylolisthesis, site unspecified: Secondary | ICD-10-CM

## 2018-08-13 DIAGNOSIS — Z96652 Presence of left artificial knee joint: Secondary | ICD-10-CM | POA: Insufficient documentation

## 2018-08-13 DIAGNOSIS — Z8542 Personal history of malignant neoplasm of other parts of uterus: Secondary | ICD-10-CM | POA: Insufficient documentation

## 2018-08-13 DIAGNOSIS — Z79899 Other long term (current) drug therapy: Secondary | ICD-10-CM | POA: Diagnosis not present

## 2018-08-13 DIAGNOSIS — M5412 Radiculopathy, cervical region: Secondary | ICD-10-CM | POA: Insufficient documentation

## 2018-08-13 MED ORDER — SODIUM CHLORIDE 0.9% FLUSH
1.0000 mL | Freq: Once | INTRAVENOUS | Status: AC
  Administered 2018-08-13: 1 mL

## 2018-08-13 MED ORDER — ROPIVACAINE HCL 2 MG/ML IJ SOLN
INTRAMUSCULAR | Status: AC
  Filled 2018-08-13: qty 10

## 2018-08-13 MED ORDER — ROPIVACAINE HCL 2 MG/ML IJ SOLN
1.0000 mL | Freq: Once | INTRAMUSCULAR | Status: AC
  Administered 2018-08-13: 1 mL via EPIDURAL

## 2018-08-13 MED ORDER — LIDOCAINE HCL 2 % IJ SOLN
20.0000 mL | Freq: Once | INTRAMUSCULAR | Status: AC
  Administered 2018-08-13: 400 mg

## 2018-08-13 MED ORDER — DEXAMETHASONE SODIUM PHOSPHATE 10 MG/ML IJ SOLN
INTRAMUSCULAR | Status: AC
  Filled 2018-08-13: qty 1

## 2018-08-13 MED ORDER — IOPAMIDOL (ISOVUE-M 200) INJECTION 41%
INTRAMUSCULAR | Status: AC
  Filled 2018-08-13: qty 10

## 2018-08-13 MED ORDER — DEXAMETHASONE SODIUM PHOSPHATE 10 MG/ML IJ SOLN
10.0000 mg | Freq: Once | INTRAMUSCULAR | Status: AC
  Administered 2018-08-13: 10 mg

## 2018-08-13 MED ORDER — LIDOCAINE HCL 2 % IJ SOLN
INTRAMUSCULAR | Status: AC
  Filled 2018-08-13: qty 20

## 2018-08-13 MED ORDER — IOPAMIDOL (ISOVUE-M 200) INJECTION 41%
10.0000 mL | Freq: Once | INTRAMUSCULAR | Status: AC
  Administered 2018-08-13: 10 mL via EPIDURAL

## 2018-08-13 MED ORDER — SODIUM CHLORIDE (PF) 0.9 % IJ SOLN
INTRAMUSCULAR | Status: AC
  Filled 2018-08-13: qty 10

## 2018-08-13 NOTE — Progress Notes (Addendum)
Safety precautions to be maintained throughout the outpatient stay will include: orient to surroundings, keep bed in low position, maintain call bell within reach at all times, provide assistance with transfer out of bed and ambulation.  

## 2018-08-13 NOTE — Progress Notes (Signed)
Patient's Name: [name]  MRN: [id number]  Referring Provider: No ref. provider found  DOB: [birth year]  PCP: System, Pcp Not In  DOS: 08/13/2018  Note by: Gaspar Cola, MD  Service setting: Ambulatory outpatient  Specialty: Interventional Pain Management  Patient type: Established  Location: [facility] (AMB) Pain Management Facility  Visit type: Interventional Procedure   Primary Reason for Visit: Interventional Pain Management Treatment. CC: Neck Pain  Procedure:          Anesthesia, Analgesia, Anxiolysis:  Type: Diagnostic, Inter-Laminar, Epidural Steroid Injection  #1  Region: Posterior Cervico-thoracic Region Level: C7-T1 Laterality: Left-Sided Paramedial  Type: Local Anesthesia Indication(s): Analgesia         Route: Infiltration (/IM) IV Access: Declined Sedation: Declined  Local Anesthetic: Lidocaine 1-2%  Position: Prone with head of the table was raised to facilitate breathing.   Indications: 1. DDD (degenerative disc disease), cervical   2. Cervicalgia   3. Spondylolisthesis, cervical region (Multilevel)   4. Spondylolisthesis, cervicothoracic region (Multilevel)   5. 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 Cervical radiculitis   7. Cervicogenic headache   8. Cervical  Grade 1 Anterolisthesis of C4/5, C5/6, & C7/T1   9. Cervical facet arthropathy (Severe) (Right: C4-5, C5-6)   10. Cervical facet syndrome (Bilateral) (R>L)   11. Cervical radiculopathy due to osteoarthritis of spine    Pain Score: Pre-procedure: 8 /10 Post-procedure: 0-No pain/10  Pre-op Assessment:  [name] is a 84 y.o. (year old), female patient, seen today for interventional treatment. She  has a past surgical history that includes Cholecystectomy; Appendectomy; Abdominal hysterectomy; Vulvectomy; Replacement total knee (Left); Image guided sinus surgery (N/A, 06/20/2015); Sphenoidectomy (Bilateral, 06/20/2015); Abdominal hysterectomy; and uterine cancer. [name] has a  current medication list which includes the following prescription(s): alendronate, aspirin-salicylamide-caffeine, atorvastatin, brimonidine, brinzolamide, calcium polycarbophil, fluticasone, hydrochlorothiazide, latanoprost, lisinopril, meclizine, melatonin-pyridoxine, memantine, mirtazapine, multivitamin with minerals, preservision areds, mupirocin ointment, nitrofurantoin (macrocrystal-monohydrate), omega-3 fatty acids, omeprazole, oxybutynin, rosuvastatin, sertraline, sertraline hcl, tramadol, travoprost (benzalkonium), and vitamin d (cholecalciferol). Her primarily concern today is the Neck Pain  Initial Vital Signs:  Pulse/HCG Rate: 76ECG Heart Rate: 80 Temp: 98.1 F (36.7 C) Resp: 16 BP: (!) 146/68 SpO2: 98 %  BMI: Estimated body mass index is 27.81 kg/m as calculated from the following:   Height as of this encounter: 5\' 4"  (1.626 m).   Weight as of this encounter: 162 lb (73.5 kg).  Risk Assessment: Allergies: Reviewed. She is allergic to [employer] tartrate] and codeine.  Allergy Precautions: None required Coagulopathies: Reviewed. None identified.  Blood-thinner therapy: None at this time Active Infection(s): Reviewed. None identified. [name] is afebrile  Site Confirmation: [name] was asked to confirm the procedure and laterality before marking the site Procedure checklist: Completed Consent: Before the procedure and under the influence of no sedative(s), amnesic(s), or anxiolytics, the patient was informed of the treatment options, risks and possible complications. To fulfill our ethical and legal obligations, as recommended by the American Medical Association's Code of Ethics, I have informed the patient of my clinical impression; the nature and purpose of the treatment or procedure; the risks, benefits, and possible complications of the intervention; the alternatives, including doing nothing; the risk(s) and benefit(s) of the alternative treatment(s) or procedure(s);  and the risk(s) and benefit(s) of doing nothing. The patient was provided information about the general risks and possible complications associated with the procedure. These may include, but are not limited to: failure to achieve desired goals, infection,  bleeding, organ or nerve damage, allergic reactions, paralysis, and death. In addition, the patient was informed of those risks and complications associated to Spine-related procedures, such as failure to decrease pain; infection (i.e.: Meningitis, epidural or intraspinal abscess); bleeding (i.e.: epidural hematoma, subarachnoid hemorrhage, or any other type of intraspinal or peri-dural bleeding); organ or nerve damage (i.e.: Any type of peripheral nerve, nerve root, or spinal cord injury) with subsequent damage to sensory, motor, and/or autonomic systems, resulting in permanent pain, numbness, and/or weakness of one or several areas of the body; allergic reactions; (i.e.: anaphylactic reaction); and/or death. Furthermore, the patient was informed of those risks and complications associated with the medications. These include, but are not limited to: allergic reactions (i.e.: anaphylactic or anaphylactoid reaction(s)); adrenal axis suppression; blood sugar elevation that in diabetics may result in ketoacidosis or comma; water retention that in patients with history of congestive heart failure may result in shortness of breath, pulmonary edema, and decompensation with resultant heart failure; weight gain; swelling or edema; medication-induced neural toxicity; particulate matter embolism and blood vessel occlusion with resultant organ, and/or nervous system infarction; and/or aseptic necrosis of one or more joints. Finally, the patient was informed that Medicine is not an exact science; therefore, there is also the possibility of unforeseen or unpredictable risks and/or possible complications that may result in a catastrophic outcome. The patient indicated having  understood very clearly. We have given the patient no guarantees and we have made no promises. Enough time was given to the patient to ask questions, all of which were answered to the patient's satisfaction. Ms. Drohan has indicated that she wanted to continue with the procedure. Attestation: I, the ordering provider, attest that I have discussed with the patient the benefits, risks, side-effects, alternatives, likelihood of achieving goals, and potential problems during recovery for the procedure that I have provided informed consent. Date  Time: 08/13/2018 10:25 AM  Pre-Procedure Preparation:  Monitoring: As per clinic protocol. Respiration, ETCO2, SpO2, BP, heart rate and rhythm monitor placed and checked for adequate function Safety Precautions: Patient was assessed for positional comfort and pressure points before starting the procedure. Time-out: I initiated and conducted the "Time-out" before starting the procedure, as per protocol. The patient was asked to participate by confirming the accuracy of the "Time Out" information. Verification of the correct person, site, and procedure were performed and confirmed by me, the nursing staff, and the patient. "Time-out" conducted as per Joint Commission's Universal Protocol (UP.01.01.01). Time: 1052  Description of Procedure:          Target Area: For Epidural Steroid injections the target is the interlaminar space, initially targeting the lower border of the superior vertebral body lamina. Approach: Paramedial approach. Area Prepped: Entire PosteriorCervical Region Prepping solution: ChloraPrep (2% chlorhexidine gluconate and 70% isopropyl alcohol) Safety Precautions: Aspiration looking for blood return was conducted prior to all injections. At no point did we inject any substances, as a needle was being advanced. No attempts were made at seeking any paresthesias. Safe injection practices and needle disposal techniques used. Medications properly  checked for expiration dates. SDV (single dose vial) medications used. Description of the Procedure: Protocol guidelines were followed. The procedure needle was introduced through the skin, ipsilateral to the reported pain, and advanced to the target area. Bone was contacted and the needle walked caudad, until the lamina was cleared. The epidural space was identified using "loss-of-resistance technique" with 2-3 ml of PF-NaCl (0.9% NSS), in a 5cc LOR glass syringe. Vitals:   08/13/18 1026  08/13/18 1051 08/13/18 1055 08/13/18 1058  BP: (!) 146/68 (!) 158/77 (!) 148/90 (!) 146/70  Pulse:      Resp:  13 16 20   Temp:      SpO2:  97% 96% 96%  Weight: 162 lb (73.5 kg)     Height: 5\' 4"  (1.626 m)       Start Time: 1052 hrs. End Time: 1058 hrs. Materials:  Needle(s) Type: Epidural needle Gauge: 17G Length: 3.5-in Medication(s): Please see orders for medications and dosing details.  Imaging Guidance (Spinal):          Type of Imaging Technique: Fluoroscopy Guidance (Spinal) Indication(s): Assistance in needle guidance and placement for procedures requiring needle placement in or near specific anatomical locations not easily accessible without such assistance. Exposure Time: Please see nurses notes. Contrast: Before injecting any contrast, we confirmed that the patient did not have an allergy to iodine, shellfish, or radiological contrast. Once satisfactory needle placement was completed at the desired level, radiological contrast was injected. Contrast injected under live fluoroscopy. No contrast complications. See chart for type and volume of contrast used. Fluoroscopic Guidance: I was personally present during the use of fluoroscopy. "Tunnel Vision Technique" used to obtain the best possible view of the target area. Parallax error corrected before commencing the procedure. "Direction-depth-direction" technique used to introduce the needle under continuous pulsed fluoroscopy. Once target was  reached, antero-posterior, oblique, and lateral fluoroscopic projection used confirm needle placement in all planes. Images permanently stored in EMR. Interpretation: I personally interpreted the imaging intraoperatively. Adequate needle placement confirmed in multiple planes. Appropriate spread of contrast into desired area was observed. No evidence of afferent or efferent intravascular uptake. No intrathecal or subarachnoid spread observed. Permanent images saved into the patient's record.  Antibiotic Prophylaxis:   Anti-infectives (From admission, onward)   None     Indication(s): None identified  Post-operative Assessment:  Post-procedure Vital Signs:  Pulse/HCG Rate: 7678 Temp: 98.1 F (36.7 C) Resp: 20 BP: (!) 146/70 SpO2: 96 %  EBL: None  Complications: No immediate post-treatment complications observed by team, or reported by patient.  Note: The patient tolerated the entire procedure well. A repeat set of vitals were taken after the procedure and the patient was kept under observation following institutional policy, for this type of procedure. Post-procedural neurological assessment was performed, showing return to baseline, prior to discharge. The patient was provided with post-procedure discharge instructions, including a section on how to identify potential problems. Should any problems arise concerning this procedure, the patient was given instructions to immediately contact us, at any time, without hesitation. In any case, we plan to contact the patient by telephone for a follow-up status report regarding this interventional procedure.  Comments:  No additional relevant information.  Plan of Care    Imaging Orders     DG C-Arm 1-60 Min-No Report  Procedure Orders     Cervical Epidural Injection  Medications ordered for procedure: Meds ordered this encounter  Medications  . iopamidol (ISOVUE-M) 41 % intrathecal injection 10 mL    Must be Myelogram-compatible. If  not available, you may substitute with a water-soluble, non-ionic, hypoallergenic, myelogram-compatible radiological contrast medium.  [name] lidocaine (XYLOCAINE) 2 % (with pres) injection 400 mg  . sodium chloride flush (NS) 0.9 % injection 1 mL  . ropivacaine (PF) 2 mg/mL (0.2%) (NAROPIN) injection 1 mL  . dexamethasone (DECADRON) injection 10 mg   Medications administered: We administered iopamidol, lidocaine, sodium chloride flush, ropivacaine (PF) 2 mg/mL (0.2%), and dexamethasone.  See the medical record for exact dosing, route, and time of administration.  Disposition: Discharge home  Discharge Date & Time: 08/13/2018; 1110 hrs.   Physician-requested Follow-up: Return for post-procedure eval (2 wks), w/ Dr. Dossie Arbour.  Future Appointments  Date Time Provider [place]  08/26/2018  2:00 PM Milinda Pointer, MD [facility] None   Primary Care Physician: System, Pcp Not In Location: [facility] Note by: Gaspar Cola, MD Date: 08/13/2018; Time: 11:26 AM  Disclaimer:  Medicine is not an exact science. The only guarantee in medicine is that nothing is guaranteed. It is important to note that the decision to proceed with this intervention was based on the information collected from the patient. The Data and conclusions were drawn from the patient's questionnaire, the interview, and the physical examination. Because the information was provided in large part by the patient, it cannot be guaranteed that it has not been purposely or unconsciously manipulated. Every effort has been made to obtain as much relevant data as possible for this evaluation. It is important to note that the conclusions that lead to this procedure are derived in large part from the available data. Always take into account that the treatment will also be dependent on availability of resources and existing treatment guidelines, considered by other Pain Management Practitioners as being  common knowledge and practice, at the time of the intervention. For Medico-Legal purposes, it is also important to point out that variation in procedural techniques and pharmacological choices are the acceptable norm. The indications, contraindications, technique, and results of the above procedure should only be interpreted and judged by a Board-Certified Interventional Pain Specialist with extensive familiarity and expertise in the same exact procedure and technique.

## 2018-08-13 NOTE — Patient Instructions (Addendum)
____________________________________________________________________________________________  Post-Procedure Discharge Instructions  Instructions:  Apply ice: Fill a plastic sandwich bag with crushed ice. Cover it with a small towel and apply to injection site. Apply for 15 minutes then remove x 15 minutes. Repeat sequence on day of procedure, until you go to bed. The purpose is to minimize swelling and discomfort after procedure.  Apply heat: Apply heat to procedure site starting the day following the procedure. The purpose is to treat any soreness and discomfort from the procedure.  Food intake: Start with clear liquids (like water) and advance to regular food, as tolerated.   Physical activities: Keep activities to a minimum for the first 8 hours after the procedure.   Driving: If you have received any sedation, you are not allowed to drive for 24 hours after your procedure.  Blood thinner: Restart your blood thinner 6 hours after your procedure. (Only for those taking blood thinners)  Insulin: As soon as you can eat, you may resume your normal dosing schedule. (Only for those taking insulin)  Infection prevention: Keep procedure site clean and dry.  Post-procedure Pain Diary: Extremely important that this be done correctly and accurately. Recorded information will be used to determine the next step in treatment.  Pain evaluated is that of treated area only. Do not include pain from an untreated area.  Complete every hour, on the hour, for the initial 8 hours. Set an alarm to help you do this part accurately.  Do not go to sleep and have it completed later. It will not be accurate.  Follow-up appointment: Keep your follow-up appointment after the procedure. Usually 2 weeks for most procedures. (6 weeks in the case of radiofrequency.) Bring you pain diary.   Expect:  From numbing medicine (AKA: Local Anesthetics): Numbness or decrease in pain.  Onset: Full effect within 15  minutes of injected.  Duration: It will depend on the type of local anesthetic used. On the average, 1 to 8 hours.   From steroids: Decrease in swelling or inflammation. Once inflammation is improved, relief of the pain will follow.  Onset of benefits: Depends on the amount of swelling present. The more swelling, the longer it will take for the benefits to be seen. In some cases, up to 10 days.  Duration: Steroids will stay in the system x 2 weeks. Duration of benefits will depend on multiple posibilities including persistent irritating factors.  Occasional side-effects: Facial flushing, cramps (if present, drink Gatorade and take over-the-counter Magnesium 450-500 mg once to twice a day).  From procedure: Some discomfort is to be expected once the numbing medicine wears off. This should be minimal if ice and heat are applied as instructed.  Call if:  You experience numbness and weakness that gets worse with time, as opposed to wearing off.  New onset bowel or bladder incontinence. (This applies to Spinal procedures only)  Emergency Numbers:  Durning business hours (Monday - Thursday, 8:00 AM - 4:00 PM) (Friday, 9:00 AM - 12:00 Noon): [phone]  After hours: [phone] ____________________________________________________________________________________________   ____________________________________________________________________________________________  Pain Scale  Introduction: The pain score used by this practice is the Verbal Numerical Rating Scale (VNRS-11). This is an 11-point scale. It is for adults and children 10 years or older. There are significant differences in how the pain score is reported, used, and applied. Forget everything you learned in the past and learn this scoring system.  General Information: The scale should reflect your current level of pain. Unless you are specifically asked   for the level of your worst pain, or your average pain. If you are asked  for one of these two, then it should be understood that it is over the past 24 hours.  Basic Activities of Daily Living (ADL): Personal hygiene, dressing, eating, transferring, and using restroom.  Instructions: Most patients tend to report their level of pain as a combination of two factors, their physical pain and their psychosocial pain. This last one is also known as "suffering" and it is reflection of how physical pain affects you socially and psychologically. From now on, report them separately. From this point on, when asked to report your pain level, report only your physical pain. Use the following table for reference.  Pain Clinic Pain Levels (0-5/10)  Pain Level Score  Description  No Pain 0   Mild pain 1 Nagging, annoying, but does not interfere with basic activities of daily living (ADL). Patients are able to eat, bathe, get dressed, toileting (being able to get on and off the toilet and perform personal hygiene functions), transfer (move in and out of bed or a chair without assistance), and maintain continence (able to control bladder and bowel functions). Blood pressure and heart rate are unaffected. A normal heart rate for a healthy adult ranges from 60 to 100 bpm (beats per minute).   Mild to moderate pain 2 Noticeable and distracting. Impossible to hide from other people. More frequent flare-ups. Still possible to adapt and function close to normal. It can be very annoying and may have occasional stronger flare-ups. With discipline, patients may get used to it and adapt.   Moderate pain 3 Interferes significantly with activities of daily living (ADL). It becomes difficult to feed, bathe, get dressed, get on and off the toilet or to perform personal hygiene functions. Difficult to get in and out of bed or a chair without assistance. Very distracting. With effort, it can be ignored when deeply involved in activities.   Moderately severe pain 4 Impossible to ignore for more than a few  minutes. With effort, patients may still be able to manage work or participate in some social activities. Very difficult to concentrate. Signs of autonomic nervous system discharge are evident: dilated pupils (mydriasis); mild sweating (diaphoresis); sleep interference. Heart rate becomes elevated (>115 bpm). Diastolic blood pressure (lower number) rises above 100 mmHg. Patients find relief in laying down and not moving.   Severe pain 5 Intense and extremely unpleasant. Associated with frowning face and frequent crying. Pain overwhelms the senses.  Ability to do any activity or maintain social relationships becomes significantly limited. Conversation becomes difficult. Pacing back and forth is common, as getting into a comfortable position is nearly impossible. Pain wakes you up from deep sleep. Physical signs will be obvious: pupillary dilation; increased sweating; goosebumps; brisk reflexes; cold, clammy hands and feet; nausea, vomiting or dry heaves; loss of appetite; significant sleep disturbance with inability to fall asleep or to remain asleep. When persistent, significant weight loss is observed due to the complete loss of appetite and sleep deprivation.  Blood pressure and heart rate becomes significantly elevated. Caution: If elevated blood pressure triggers a pounding headache associated with blurred vision, then the patient should immediately seek attention at an urgent or emergency care unit, as these may be signs of an impending stroke.    Emergency Department Pain Levels (6-10/10)  Emergency Room Pain 6 Severely limiting. Requires emergency care and should not be seen or managed at an outpatient pain management facility. Communication becomes difficult  and requires great effort. Assistance to reach the emergency department may be required. Facial flushing and profuse sweating along with potentially dangerous increases in heart rate and blood pressure will be evident.   Distressing pain 7  Self-care is very difficult. Assistance is required to transport, or use restroom. Assistance to reach the emergency department will be required. Tasks requiring coordination, such as bathing and getting dressed become very difficult.   Disabling pain 8 Self-care is no longer possible. At this level, pain is disabling. The individual is unable to do even the most "basic" activities such as walking, eating, bathing, dressing, transferring to a bed, or toileting. Fine motor skills are lost. It is difficult to think clearly.   Incapacitating pain 9 Pain becomes incapacitating. Thought processing is no longer possible. Difficult to remember your own name. Control of movement and coordination are lost.   The worst pain imaginable 10 At this level, most patients pass out from pain. When this level is reached, collapse of the autonomic nervous system occurs, leading to a sudden drop in blood pressure and heart rate. This in turn results in a temporary and dramatic drop in blood flow to the brain, leading to a loss of consciousness. Fainting is one of the body's self defense mechanisms. Passing out puts the brain in a calmed state and causes it to shut down for a while, in order to begin the healing process.    Summary: 1. Refer to this scale when providing [place] with your pain level. 2. Be accurate and careful when reporting your pain level. This will help with your care. 3. Over-reporting your pain level will lead to loss of credibility. 4. Even a level of 1/10 means that there is pain and will be treated at our facility. 5. High, inaccurate reporting will be documented as "Symptom Exaggeration", leading to loss of credibility and suspicions of possible secondary gains such as obtaining more narcotics, or wanting to appear disabled, for fraudulent reasons. 6. Only pain levels of 5 or below will be seen at our facility. 7. Pain levels of 6 and above will be sent to the Emergency Department and the appointment  cancelled. ____________________________________________________________________________________________   Pain Management Discharge Instructions  General Discharge Instructions :  If you need to reach your doctor call: Monday-Friday 8:00 am - 4:00 pm at [phone] or toll free [phone].  After clinic hours [phone] to have operator reach doctor.  Bring all of your medication bottles to all your appointments in the pain clinic.  To cancel or reschedule your appointment with Pain Management please remember to call 24 hours in advance to avoid a fee.  Refer to the educational materials which you have been given on: General Risks, I had my Procedure. Discharge Instructions, Post Sedation.  Post Procedure Instructions:  The drugs you were given will stay in your system until tomorrow, so for the next 24 hours you should not drive, make any legal decisions or drink any alcoholic beverages.  You may eat anything you prefer, but it is better to start with liquids then soups and crackers, and gradually work up to solid foods.  Please notify your doctor immediately if you have any unusual bleeding, trouble breathing or pain that is not related to your normal pain.  Depending on the type of procedure that was done, some parts of your body may feel week and/or numb.  This usually clears up by tonight or the next day.  Walk with the use of an assistive device or  accompanied by an adult for the 24 hours.  You may use ice on the affected area for the first 24 hours.  Put ice in a Ziploc bag and cover with a towel and place against area 15 minutes on 15 minutes off.  You may switch to heat after 24 hours.Epidural Steroid Injection Patient Information  Description: The epidural space surrounds the nerves as they exit the spinal cord.  In some patients, the nerves can be compressed and inflamed by a bulging disc or a tight spinal canal (spinal stenosis).  By injecting steroids into the  epidural space, we can bring irritated nerves into direct contact with a potentially helpful medication.  These steroids act directly on the irritated nerves and can reduce swelling and inflammation which often leads to decreased pain.  Epidural steroids may be injected anywhere along the spine and from the neck to the low back depending upon the location of your pain.   After numbing the skin with local anesthetic (like Novocaine), a small needle is passed into the epidural space slowly.  You may experience a sensation of pressure while this is being done.  The entire block usually last less than 10 minutes.  Conditions which may be treated by epidural steroids:   Low back and leg pain  Neck and arm pain  Spinal stenosis  Post-laminectomy syndrome  Herpes zoster (shingles) pain  Pain from compression fractures  Preparation for the injection:  1. Do not eat any solid food or dairy products within 8 hours of your appointment.  2. You may drink clear liquids up to 3 hours before appointment.  Clear liquids include water, black coffee, juice or soda.  No milk or cream please. 3. You may take your regular medication, including pain medications, with a sip of water before your appointment  Diabetics should hold regular insulin (if taken separately) and take 1/2 normal NPH dos the morning of the procedure.  Carry some sugar containing items with you to your appointment. 4. A driver must accompany you and be prepared to drive you home after your procedure.  5. Bring all your current medications with your. 6. An IV may be inserted and sedation may be given at the discretion of the physician.   7. A blood pressure cuff, EKG and other monitors will often be applied during the procedure.  Some patients may need to have extra oxygen administered for a short period. 8. You will be asked to provide medical information, including your allergies, prior to the procedure.  We must know immediately if you  are taking blood thinners (like Coumadin/Warfarin)  Or if you are allergic to IV iodine contrast (dye). We must know if you could possible be pregnant.  Possible side-effects:  Bleeding from needle site  Infection (rare, may require surgery)  Nerve injury (rare)  Numbness & tingling (temporary)  Difficulty urinating (rare, temporary)  Spinal headache ( a headache worse with upright posture)  Light -headedness (temporary)  Pain at injection site (several days)  Decreased blood pressure (temporary)  Weakness in arm/leg (temporary)  Pressure sensation in back/neck (temporary)  Call if you experience:  Fever/chills associated with headache or increased back/neck pain.  Headache worsened by an upright position.  New onset weakness or numbness of an extremity below the injection site  Hives or difficulty breathing (go to the emergency room)  Inflammation or drainage at the infection site  Severe back/neck pain  Any new symptoms which are concerning to you  Please note:  Although the local anesthetic injected can often make your back or neck feel good for several hours after the injection, the pain will likely return.  It takes 3-7 days for steroids to work in the epidural space.  You may not notice any pain relief for at least that one week.  If effective, we will often do a series of three injections spaced 3-6 weeks apart to maximally decrease your pain.  After the initial series, we generally will wait several months before considering a repeat injection of the same type.  If you have any questions, please call [phone] [facility]

## 2018-08-13 NOTE — Progress Notes (Signed)
Nursing Pain Medication Assessment:  Safety precautions to be maintained throughout the outpatient stay will include: orient to surroundings, keep bed in low position, maintain call bell within reach at all times, provide assistance with transfer out of bed and ambulation.  Medication Inspection Compliance: Pill count conducted under aseptic conditions, in front of the patient. Neither the pills nor the bottle was removed from the patient's sight at any time. Once count was completed pills were immediately returned to the patient in their original bottle.  Medication: Tramadol (Ultram) Pill/Patch Count: 31 of 62 pills remain Pill/Patch Appearance: Markings consistent with prescribed medication Bottle Appearance: Standard pharmacy container. Clearly labeled. Filled Date: 59 / 21 / 2019 Last Medication intake:  Yesterday

## 2018-08-14 ENCOUNTER — Telehealth: Payer: Self-pay

## 2018-08-14 NOTE — Telephone Encounter (Signed)
Post procedure phone call.  Patient states she is doing OK this morning.

## 2018-08-25 NOTE — Progress Notes (Signed)
Patient's Name: [name]  MRN: [id number]  Referring Provider: No ref. provider found  DOB: November 26, 1925  PCP: System, Pcp Not In  DOS: 08/26/2018  Note by: Gaspar Cola, MD  Service setting: Ambulatory outpatient  Specialty: Interventional Pain Management  Location: [facility] (AMB) Pain Management Facility    Patient type: Established   Primary Reason(s) for Visit: Encounter for post-procedure evaluation of chronic illness with mild to moderate exacerbation CC: No chief complaint on file.  HPI  [name] is a 84 y.o. year old, female patient, who comes today for a post-procedure evaluation. She has Dizziness; Essential hypertension, benign; History of frequent urinary tract infections; Hypercholesterolemia; Pulmonary hypertension ([place]); Cervicogenic headache; Loss of vision; Vision loss, left eye; Chest pain; DOE (dyspnea on exertion); Cervicalgia; Skin lesion of breast; Elevated alkaline phosphatase level; Cough; Pain of left thumb; URI (upper respiratory infection); Frequent falls; Hyperglycemia; Health care maintenance; Vertigo; Hyperlipidemia; GERD (gastroesophageal reflux disease); Dementia ([place]); Abnormal levels of other serum enzymes; Bladder prolapse, female, acquired; Chronic headaches; Disorder of breast; Frequency of micturition; Major depressive disorder, single episode; Mixed stress and urge urinary incontinence; OAB (overactive bladder); Osteoarthritis of shoulders (Bilateral); Pain of finger of left hand; Rectocele; Sleep disorder; Urethral prolapse; Vaginal atrophy; Dysuria; Anxiety disorder; Depression with anxiety; Other forms of dyspnea; Essential (primary) hypertension; Encounter for general adult medical examination without abnormal findings; Personal history of urinary infection; Pure hypercholesterolemia; 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; Visual loss; Other secondary pulmonary hypertension ([place]); Chronic shoulder pain (Primary Area of  Pain) (Bilateral) (R>L); Chronic knee pain (Secondary Area of Pain) (Right); Chronic hip pain (Tertiary Area of Pain) (Bilateral) (R>L); Chronic pain syndrome; Pharmacologic therapy; Disorder of skeletal system; Problems influencing health status; Hx of total knee replacement (Left); DDD (degenerative disc disease), cervical; Spondylolisthesis, cervical region (Multilevel); Spondylolisthesis, cervicothoracic region (Multilevel); 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 Her primarily concern today is the No chief complaint on file.  Pain Assessment: Location: Right, Left, Posterior Shoulder Radiating: "Radiates from shoulders all the way to below shoulder blades" Onset: More than a month ago Duration: Chronic pain Quality: Aching, Sharp, Throbbing Severity: 8 /10 (subjective, self-reported pain score)  Note: Reported level is inconsistent with clinical observations. Clinically the patient looks like a 4/10 A 4/10 is viewed as "Moderately Severe" and described as impossible to ignore for more than a few minutes. With effort, patients may still be able to manage work or participate in some social activities. Very difficult to concentrate. Signs of autonomic nervous system discharge are evident: dilated pupils (mydriasis); mild sweating (diaphoresis); sleep interference. Heart rate becomes elevated (>115 bpm). Diastolic blood pressure (lower number) rises above 100 mmHg. Patients find relief in laying down and not moving. Information on the proper  use of the pain scale provided to the patient today. When using our objective Pain Scale, levels between 6 and 10/10 are said to belong in an emergency room, as it progressively worsens from a 6/10, described as severely limiting, requiring emergency care not usually available at an outpatient pain management facility. At a 6/10 level, communication becomes difficult and requires great effort. Assistance to reach the  emergency department may be required. Facial flushing and profuse sweating along with potentially dangerous increases in heart rate and blood pressure will be evident. Effect on ADL: "It hurts real bad. Does not do many activites"  Timing: Constant Modifying factors: Procedure, medication  BP: 118/64  HR: 71  [name] comes in today for post-procedure evaluation.  The patient comes into the clinic today in a wheelchair, accompanied by her daughter [name].  According to the results of her initial left-sided cervical epidural steroid injection #1, she did obtain 100% relief of the pain for approximately 3 days.  The pain then began to come back but her primary complaint is that of shoulder pain and decreased range of motion.  X-rays of the area show severe osteoarthritis affecting the glenohumeral and acromioclavicular joints.  Findings are similar bilaterally.  Because at this point her primary concern is the shoulder pain, I will bring the patient back and do a bilateral intra-articular shoulder joint injection in the area of the glenohumeral joint as well as the [facility] joint.  This injection will be primarily diagnostic to try to find out how much of the pain is really coming from the shoulder and how much is radicular from the severe bilateral foraminal stenosis that she has from C4-5 all the way down to the C6-7 area.  Further details on both, my assessment(s), as well as the proposed treatment plan, please see below.  Post-Procedure Assessment  08/13/2018 Procedure: Diagnostic left-sided  cervical epidural steroid injection #1 under fluoroscopic guidance, no sedation Pre-procedure pain score:  8/10 Post-procedure pain score: 0/10 (100% relief) Influential Factors: BMI: 29.05 kg/m Intra-procedural challenges: None observed.         Assessment challenges: None detected.              Reported side-effects: None.        Post-procedural adverse reactions or complications: None reported         Sedation: No sedation used. When no sedatives are used, the analgesic levels obtained are directly associated to the effectiveness of the local anesthetics. However, when sedation is provided, the level of analgesia obtained during the initial 1 hour following the intervention, is believed to be the result of a combination of factors. These factors may include, but are not limited to: 1. The effectiveness of the local anesthetics used. 2. The effects of the analgesic(s) and/or anxiolytic(s) used. 3. The degree of discomfort experienced by the patient at the time of the procedure. 4. The patients ability and reliability in recalling and recording the events. 5. The presence and influence of possible secondary gains and/or psychosocial factors. Reported result: Relief experienced during the 1st hour after the procedure: 100 % (Ultra-Short Term Relief) Ms. Minchew has indicated area to have been numb during this time. Interpretative annotation: Clinically appropriate result. Analgesia during this period is likely to be Local Anesthetic and/or IV Sedative (Analgesic/Anxiolytic) related.          Effects of local anesthetic: The analgesic effects attained during this period are directly associated to the localized infiltration of local anesthetics and therefore cary significant diagnostic value as to the etiological location, or anatomical origin, of the pain. Expected duration of relief is directly dependent on the pharmacodynamics of the local anesthetic used. Long-acting (4-6 hours) anesthetics  used.  Reported result: Relief during the next 4 to 6 hour after the procedure: 100 % (Short-Term Relief) Ms. Soler has indicated area to have been numb during this time. Interpretative annotation: Clinically appropriate  result. Analgesia during this period is likely to be Local Anesthetic-related.          Long-term benefit: Defined as the period of time past the expected duration of local anesthetics (1 hour for short-acting and 4-6 hours for long-acting). With the possible exception of prolonged sympathetic blockade from the local anesthetics, benefits during this period are typically attributed to, or associated with, other factors such as analgesic sensory neuropraxia, antiinflammatory effects, or beneficial biochemical changes provided by agents other than the local anesthetics.  Reported result: Extended relief following procedure: 25 %(3 days 100% relief) (Long-Term Relief)            Interpretative annotation: Clinically possible results. Good relief. No permanent benefit expected. Inflammation plays a part in the etiology to the pain.          Current benefits: Defined as reported results that persistent at this point in time.   Analgesia: <25 % Ms. Ace reports improvement of axial symptoms. Function: Back to baseline ROM: Back to baseline Interpretative annotation: Recurrence of symptoms. No permanent benefit expected. Results would suggest persistent aggravating factors.          Interpretation: Results would suggest a successful diagnostic intervention.                  Plan:  Re-assessment of algesic etiology. At this point, we will address the issue of the patient's severe shoulder osteoarthritis by doing intra-articular glenohumeral and acromioclavicular joint injections.  We will see how she does with the local anesthetic and steroid.  She indicates that she has never had any shoulder injections done in the past.  Should she get good benefit, but no long-term relief, we will  consider the possibility of doing a diagnostic bilateral suprascapular nerve blocks for possible radiofrequency.          Laboratory Chemistry  Inflammation Markers (CRP: Acute Phase) (ESR: Chronic Phase) Lab Results  Component Value Date   CRP 8 07/14/2018   ESRSEDRATE 22 07/14/2018   LATICACIDVEN 0.77 03/04/2014                         Rheumatology Markers Lab Results  Component Value Date   RF <10 01/28/2014   ANA NEG 01/28/2014                        Renal Markers Lab Results  Component Value Date   BUN 17 07/14/2018   CREATININE 1.00 07/14/2018   BCR 17 07/14/2018   GFRAA 57 (L) 07/14/2018   GFRNONAA 50 (L) 07/14/2018                             Hepatic Markers Lab Results  Component Value Date   AST 16 07/14/2018   ALT 18 10/18/2016   ALBUMIN 4.4 07/14/2018                        Neuropathy Markers Lab Results  Component Value Date   VITAMINB12 278 07/14/2018   HGBA1C 5.8 10/18/2016                        Hematology Parameters Lab Results  Component Value Date   INR 1.07 01/15/2016   LABPROT 14.1 01/15/2016   APTT 33 01/15/2016   PLT 270.0 05/31/2016   HGB 13.4 05/31/2016   HCT  40.0 05/31/2016                        CV Markers Lab Results  Component Value Date   TROPONINI <0.03 03/01/2016                         Note: Lab results reviewed.  Recent Imaging Results   Results for orders placed in visit on 08/13/18  DG C-Arm 1-60 Min-No Report   Narrative Fluoroscopy was utilized by the requesting physician.  No radiographic  interpretation.    Interpretation Report: Fluoroscopy was used during the procedure to assist with needle guidance. The images were interpreted intraoperatively by the requesting physician.  Meds   Current Outpatient Medications:  .  alendronate (FOSAMAX) 70 MG tablet, Take 70 mg by mouth once a week. Take with a full glass of water on an empty stomach., Disp: , Rfl:  .  Aspirin-Salicylamide-Caffeine (BC HEADACHE  POWDER PO), Take 1 packet by mouth daily as needed (pain/headache)., Disp: , Rfl:  .  atorvastatin (LIPITOR) 40 MG tablet, TAKE 1 TABLET AT 6 PM, Disp: 90 tablet, Rfl: 1 .  brimonidine (ALPHAGAN P) 0.1 % SOLN, Place 1 drop into both eyes 2 (two) times daily., Disp: , Rfl:  .  brinzolamide (AZOPT) 1 % ophthalmic suspension, Place 1 drop into the left eye 3 (three) times daily., Disp: 10 mL, Rfl: 12 .  Calcium Polycarbophil (FIBER-LAX PO), Take by mouth., Disp: , Rfl:  .  fluticasone (FLONASE) 50 MCG/ACT nasal spray, Place 2 sprays into both nostrils daily. (Patient taking differently: Place 2 sprays into both nostrils daily as needed (seasonal allergies). ), Disp: 16 g, Rfl: 1 .  hydrochlorothiazide (MICROZIDE) 12.5 MG capsule, Take 1 capsule (12.5 mg total) by mouth daily., Disp: 30 capsule, Rfl: 3 .  latanoprost (XALATAN) 0.005 % ophthalmic solution, Place 1 drop into both eyes at bedtime., Disp: 2.5 mL, Rfl: 12 .  lisinopril (PRINIVIL,ZESTRIL) 10 MG tablet, Take 1 tablet (10 mg total) by mouth daily., Disp: 30 tablet, Rfl: 5 .  meclizine (ANTIVERT) 25 MG tablet, Take 1 tablet (25 mg total) by mouth 3 (three) times daily as needed for dizziness., Disp: 90 tablet, Rfl: 3 .  Melatonin-Pyridoxine (MELATIN PO), Take 3 mg by mouth., Disp: , Rfl:  .  memantine (NAMENDA) 10 MG tablet, Take 10 mg by mouth 2 (two) times daily. , Disp: , Rfl:  .  mirtazapine (REMERON) 30 MG tablet, Take 30 mg by mouth at bedtime., Disp: , Rfl:  .  Multiple Vitamin (MULTIVITAMIN WITH MINERALS) TABS tablet, Take 1 tablet by mouth daily., Disp: , Rfl:  .  Multiple Vitamins-Minerals (PRESERVISION AREDS) TABS, Take 1 tablet by mouth daily. , Disp: , Rfl:  .  mupirocin ointment (BACTROBAN) 2 %, Apply to affected area on chest and scalp bid x 1 week., Disp: 22 g, Rfl: 0 .  nitrofurantoin, macrocrystal-monohydrate, (MACROBID) 100 MG capsule, Take 1 capsule (100 mg total) by mouth every 12 (twelve) hours., Disp: 14 capsule, Rfl: 0 .   Omega-3 Fatty Acids (FISH OIL CONCENTRATE PO), Take 1 capsule by mouth daily. , Disp: , Rfl:  .  omeprazole (PRILOSEC) 20 MG capsule, Take 1 capsule (20 mg total) by mouth every morning., Disp: 90 capsule, Rfl: 1 .  oxybutynin (OXYTROL) 3.9 MG/24HR, Place 1 patch onto the skin every 3 (three) days., Disp: 8 patch, Rfl: 0 .  rosuvastatin (CRESTOR) 20 MG tablet, Take 1  tablet (20 mg total) by mouth at bedtime., Disp: 30 tablet, Rfl: 3 .  sertraline (ZOLOFT) 100 MG tablet, Take 150 mg by mouth daily. , Disp: , Rfl:  .  SERTRALINE HCL PO, Take 100 mg by mouth daily., Disp: , Rfl:  .  traMADol (ULTRAM) 50 MG tablet, Take 1 tablet (50 mg total) by mouth 2 (two) times daily. Must last 30 days., Disp: 60 tablet, Rfl: 2 .  travoprost, benzalkonium, (TRAVATAN) 0.004 % ophthalmic solution, Place 1 drop into both eyes at bedtime., Disp: , Rfl:  .  vitamin D, CHOLECALCIFEROL, 400 UNITS tablet, Take 400 Units by mouth daily., Disp: , Rfl:   ROS  Constitutional: Denies any fever or chills Gastrointestinal: No reported hemesis, hematochezia, vomiting, or acute GI distress Musculoskeletal: Denies any acute onset joint swelling, redness, loss of ROM, or weakness Neurological: No reported episodes of acute onset apraxia, aphasia, dysarthria, agnosia, amnesia, paralysis, loss of coordination, or loss of consciousness  Allergies  Ms. Pousson is allergic to [employer] tartrate] and codeine.  [place]  Drug: Ms. Burkholder  reports that she does not use drugs. Alcohol:  reports that she does not drink alcohol. Tobacco:  reports that she has never smoked. She has never used smokeless tobacco. Medical:  has a past medical history of Anxiety, Arthritis, Cancer ([place]), Chronic headaches, Cystocele, Dementia ([place]), Depression, GERD (gastroesophageal reflux disease), Glaucoma, Hypercholesterolemia, Hypertension, Macular degeneration, Reactive airway disease, Rectocele, Right bundle branch block, Sleep apnea, Uterine cancer  ([place]), Vertigo, and Wears dentures. Surgical: Ms. Montelongo  has a past surgical history that includes Cholecystectomy; Appendectomy; Abdominal hysterectomy; Vulvectomy; Replacement total knee (Left); Image guided sinus surgery (N/A, 06/20/2015); Sphenoidectomy (Bilateral, 06/20/2015); Abdominal hysterectomy; and uterine cancer. Family: family history includes Cancer in her daughter and father; Heart disease in her brother and mother.  Constitutional Exam  General appearance: Well nourished, well developed, and well hydrated. In no apparent acute distress Vitals:   08/26/18 1427  BP: 118/64  Pulse: 71  Resp: 18  Temp: 97.9 F (36.6 C)  SpO2: 95%  Weight: 164 lb (74.4 kg)  Height: '5\' 3"'$  (1.6 m)   BMI Assessment: Estimated body mass index is 29.05 kg/m as calculated from the following:   Height as of this encounter: '5\' 3"'$  (1.6 m).   Weight as of this encounter: 164 lb (74.4 kg).  BMI interpretation table: BMI level Category Range association with higher incidence of chronic pain  <18 kg/m2 Underweight   18.5-24.9 kg/m2 Ideal body weight   25-29.9 kg/m2 Overweight Increased incidence by 20%  30-34.9 kg/m2 Obese (Class I) Increased incidence by 68%  35-39.9 kg/m2 Severe obesity (Class II) Increased incidence by 136%  >40 kg/m2 Extreme obesity (Class III) Increased incidence by 254%   Patient's current BMI Ideal Body weight  Body mass index is 29.05 kg/m. Ideal body weight: 52.4 kg (115 lb 8.3 oz) Adjusted ideal body weight: 61.2 kg (134 lb 14.6 oz)   BMI Readings from Last 4 Encounters:  08/26/18 29.05 kg/m  08/13/18 27.81 kg/m  07/27/18 30.00 kg/m  07/14/18 27.81 kg/m   Wt Readings from Last 4 Encounters:  08/26/18 164 lb (74.4 kg)  08/13/18 162 lb (73.5 kg)  07/27/18 164 lb (74.4 kg)  07/14/18 162 lb (73.5 kg)  Psych/Mental status: Alert, oriented x 3 (person, place, & time)       Eyes: PERLA Respiratory: No evidence of acute respiratory distress  Cervical Spine Area  Exam  Skin & Axial Inspection: No masses, redness, edema,  swelling, or associated skin lesions Alignment: Symmetrical Functional ROM: Decreased ROM, bilaterally Stability: No instability detected Muscle Tone/Strength: Guarding observed Sensory (Neurological): Movement-associated pain Palpation: Complains of area being tender to palpation              Upper Extremity (UE) Exam    Side: Right upper extremity  Side: Left upper extremity  Skin & Extremity Inspection: Atrophy of the muscles surrounding the shoulder  Skin & Extremity Inspection: Atrophy of the muscles surrounding the shoulder  Functional ROM: Minimal ROM for shoulder  Functional ROM: Minimal ROM for shoulder  Muscle Tone/Strength: Guarding  Muscle Tone/Strength: TEFL teacher (Neurological): Movement-associated pain affecting the shoulder  Sensory (Neurological): Movement-associated pain affecting the shoulder  Palpation: Complains of area being tender to palpation              Palpation: Complains of area being tender to palpation              Provocative Test(s):  Phalen's test: deferred Tinel's test: deferred Apley's scratch test (touch opposite shoulder):  Action 1 (Across chest): deferred Action 2 (Overhead): deferred Action 3 (LB reach): deferred   Provocative Test(s):  Phalen's test: deferred Tinel's test: deferred Apley's scratch test (touch opposite shoulder):  Action 1 (Across chest): deferred Action 2 (Overhead): deferred Action 3 (LB reach): deferred    Thoracic Spine Area Exam  Skin & Axial Inspection: No masses, redness, or swelling Alignment: Symmetrical Functional ROM: Unrestricted ROM Stability: No instability detected Muscle Tone/Strength: Functionally intact. No obvious neuro-muscular anomalies detected. Sensory (Neurological): Unimpaired Muscle strength & Tone: No palpable anomalies  Lumbar Spine Area Exam  Skin & Axial Inspection: No masses, redness, or swelling Alignment:  Symmetrical Functional ROM: Unrestricted ROM       Stability: No instability detected Muscle Tone/Strength: Functionally intact. No obvious neuro-muscular anomalies detected. Sensory (Neurological): Unimpaired Palpation: No palpable anomalies       Provocative Tests: Hyperextension/rotation test: deferred today       Lumbar quadrant test (Kemp's test): deferred today       Lateral bending test: deferred today       Patrick's Maneuver: deferred today                   FABER test: deferred today                   S-I anterior distraction/compression test: deferred today         S-I lateral compression test: deferred today         S-I Thigh-thrust test: deferred today         S-I Gaenslen's test: deferred today          Gait & Posture Assessment  Ambulation: Patient ambulates using a wheel chair Gait: Significantly limited. Dependent on assistive device to ambulate Posture: Slouching   Lower Extremity Exam    Side: Right lower extremity  Side: Left lower extremity  Stability: No instability observed          Stability: No instability observed          Skin & Extremity Inspection: Skin color, temperature, and hair growth are WNL. No peripheral edema or cyanosis. No masses, redness, swelling, asymmetry, or associated skin lesions. No contractures.  Skin & Extremity Inspection: Skin color, temperature, and hair growth are WNL. No peripheral edema or cyanosis. No masses, redness, swelling, asymmetry, or associated skin lesions. No contractures.  Functional ROM: Unrestricted ROM  Functional ROM: Unrestricted ROM                  Muscle Tone/Strength: Functionally intact. No obvious neuro-muscular anomalies detected.  Muscle Tone/Strength: Functionally intact. No obvious neuro-muscular anomalies detected.  Sensory (Neurological): Unimpaired medial portion of foot (L4)  Sensory (Neurological): Unimpaired medial portion of foot (L4)  DTR: Patellar: deferred today Achilles:  deferred today Plantar: deferred today  DTR: Patellar: deferred today Achilles: deferred today Plantar: deferred today  Palpation: No palpable anomalies  Palpation: No palpable anomalies   Assessment  Primary Diagnosis & Pertinent Problem List: The primary encounter diagnosis was Chronic shoulder pain (Primary Area of Pain) (Bilateral) (R>L). Diagnoses of Osteoarthritis of shoulders (Bilateral), Osteoarthritis of glenohumeral joint (Severe) (Bilateral), Chronic acromioclavicular (AC) joint pain (Bilateral), Osteoarthritis of AC (acromioclavicular) joint (Severe) (Bilateral), Cervical radiculitis, Cervicalgia, Cervicogenic headache, DDD (degenerative disc disease), cervical, and Chronic pain syndrome were also pertinent to this visit.  Status Diagnosis  Persistent Worsening Worsening 1. Chronic shoulder pain (Primary Area of Pain) (Bilateral) (R>L)   2. Osteoarthritis of shoulders (Bilateral)   3. Osteoarthritis of glenohumeral joint (Severe) (Bilateral)   4. Chronic acromioclavicular (AC) joint pain (Bilateral)   5. Osteoarthritis of AC (acromioclavicular) joint (Severe) (Bilateral)   6. Cervical radiculitis   7. Cervicalgia   8. Cervicogenic headache   9. DDD (degenerative disc disease), cervical   10. Chronic pain syndrome     Problems updated and reviewed during this visit: Problem  Osteoarthritis of glenohumeral joint (Severe) (Bilateral)  Osteoarthritis of AC (acromioclavicular) joint (Severe) (Bilateral)  Chronic acromioclavicular (AC) joint pain (Bilateral)  Cervical radiculitis (Bilateral)  Osteoarthritis of shoulders (Bilateral)   Plan of Care  Pharmacotherapy (Medications Ordered): Meds ordered this encounter  Medications  . DISCONTD: traMADol (ULTRAM) 50 MG tablet    Sig: Take 1 tablet (50 mg total) by mouth 2 (two) times daily. Must last 30 days.    Dispense:  60 tablet    Refill:  2    [employer] STOP ACT - Not applicable. Fill one day early if pharmacy is closed on  scheduled refill date. Do not fill until: 08/26/18. Must last 30 days. To last until: 11/24/18.  [name] traMADol (ULTRAM) 50 MG tablet    Sig: Take 1 tablet (50 mg total) by mouth 2 (two) times daily. Must last 30 days.    Dispense:  60 tablet    Refill:  2    [place] STOP ACT - Not applicable. Fill one day early if pharmacy is closed on scheduled refill date. Do not fill until: 08/26/18. Must last 30 days. To last until: 11/24/18.   Medications administered today: Areatha Keas had no medications administered during this visit.   Procedure Orders     Cervical Epidural Injection     SHOULDER INJECTION Lab Orders  No laboratory test(s) ordered today   Imaging Orders  No imaging studies ordered today   Referral Orders  No referral(s) requested today   Interventional management options: Planned, scheduled, and/or pending:   Diagnostic bilateral intra-articular shoulder joint injection (glenohumeral joint and acromioclavicular joint) #1 under fluoroscopic guidance, no sedation.   Considering:   Diagnosticbilateral intra-articular shoulder injections  Diagnostic bilateral intra-articular glenohumeral joint injection  Diagnostic bilateral intra-articular acromioclavicular joint injection  Diagnosticbilateral suprascapular nerve blocks  Possible bilateral suprascapular RFA  Therapeutic left-sided Cervical ESI #2 (100/100/25) He diagnosticright intra-articular knee injection  Diagnostic right knee Hyalgan series  Diagnostic right genicular nerve block  Possible right genicular nerve RFA  Palliative PRN treatment(s):   Diagnostic left-sided cervical epidural steroid injection #2 under fluoroscopic guidance, no sedation   Provider-requested follow-up: Return for Procedure (no sedation): (B) IA Shoulder inj #1.  No future appointments. Primary Care Physician: System, Pcp Not In Location: [facility] Note by: Gaspar Cola, MD Date: 08/26/2018;  Time: 4:17 PM

## 2018-08-26 ENCOUNTER — Other Ambulatory Visit: Payer: Self-pay

## 2018-08-26 ENCOUNTER — Ambulatory Visit: Payer: Medicare (Managed Care) | Attending: Pain Medicine | Admitting: Pain Medicine

## 2018-08-26 ENCOUNTER — Encounter: Payer: Self-pay | Admitting: Pain Medicine

## 2018-08-26 VITALS — BP 118/64 | HR 71 | Temp 97.9°F | Resp 18 | Ht 63.0 in | Wt 164.0 lb

## 2018-08-26 DIAGNOSIS — M19011 Primary osteoarthritis, right shoulder: Secondary | ICD-10-CM

## 2018-08-26 DIAGNOSIS — E785 Hyperlipidemia, unspecified: Secondary | ICD-10-CM | POA: Insufficient documentation

## 2018-08-26 DIAGNOSIS — F418 Other specified anxiety disorders: Secondary | ICD-10-CM | POA: Insufficient documentation

## 2018-08-26 DIAGNOSIS — M79645 Pain in left finger(s): Secondary | ICD-10-CM | POA: Insufficient documentation

## 2018-08-26 DIAGNOSIS — F0390 Unspecified dementia without behavioral disturbance: Secondary | ICD-10-CM | POA: Insufficient documentation

## 2018-08-26 DIAGNOSIS — I10 Essential (primary) hypertension: Secondary | ICD-10-CM | POA: Insufficient documentation

## 2018-08-26 DIAGNOSIS — M4313 Spondylolisthesis, cervicothoracic region: Secondary | ICD-10-CM | POA: Insufficient documentation

## 2018-08-26 DIAGNOSIS — R51 Headache: Secondary | ICD-10-CM

## 2018-08-26 DIAGNOSIS — M25511 Pain in right shoulder: Secondary | ICD-10-CM | POA: Diagnosis not present

## 2018-08-26 DIAGNOSIS — Z79899 Other long term (current) drug therapy: Secondary | ICD-10-CM | POA: Insufficient documentation

## 2018-08-26 DIAGNOSIS — K219 Gastro-esophageal reflux disease without esophagitis: Secondary | ICD-10-CM | POA: Insufficient documentation

## 2018-08-26 DIAGNOSIS — M542 Cervicalgia: Secondary | ICD-10-CM

## 2018-08-26 DIAGNOSIS — Z8249 Family history of ischemic heart disease and other diseases of the circulatory system: Secondary | ICD-10-CM | POA: Insufficient documentation

## 2018-08-26 DIAGNOSIS — M4312 Spondylolisthesis, cervical region: Secondary | ICD-10-CM | POA: Insufficient documentation

## 2018-08-26 DIAGNOSIS — M25512 Pain in left shoulder: Secondary | ICD-10-CM | POA: Diagnosis not present

## 2018-08-26 DIAGNOSIS — R296 Repeated falls: Secondary | ICD-10-CM | POA: Diagnosis not present

## 2018-08-26 DIAGNOSIS — G4486 Cervicogenic headache: Secondary | ICD-10-CM

## 2018-08-26 DIAGNOSIS — H409 Unspecified glaucoma: Secondary | ICD-10-CM | POA: Insufficient documentation

## 2018-08-26 DIAGNOSIS — Z8542 Personal history of malignant neoplasm of other parts of uterus: Secondary | ICD-10-CM | POA: Insufficient documentation

## 2018-08-26 DIAGNOSIS — E7800 Pure hypercholesterolemia, unspecified: Secondary | ICD-10-CM | POA: Insufficient documentation

## 2018-08-26 DIAGNOSIS — G8929 Other chronic pain: Secondary | ICD-10-CM | POA: Diagnosis not present

## 2018-08-26 DIAGNOSIS — M4802 Spinal stenosis, cervical region: Secondary | ICD-10-CM | POA: Diagnosis not present

## 2018-08-26 DIAGNOSIS — M25551 Pain in right hip: Secondary | ICD-10-CM | POA: Insufficient documentation

## 2018-08-26 DIAGNOSIS — M5412 Radiculopathy, cervical region: Secondary | ICD-10-CM

## 2018-08-26 DIAGNOSIS — G894 Chronic pain syndrome: Secondary | ICD-10-CM | POA: Diagnosis not present

## 2018-08-26 DIAGNOSIS — Z885 Allergy status to narcotic agent status: Secondary | ICD-10-CM | POA: Insufficient documentation

## 2018-08-26 DIAGNOSIS — M25552 Pain in left hip: Secondary | ICD-10-CM | POA: Diagnosis not present

## 2018-08-26 DIAGNOSIS — M19012 Primary osteoarthritis, left shoulder: Secondary | ICD-10-CM | POA: Diagnosis not present

## 2018-08-26 DIAGNOSIS — I272 Pulmonary hypertension, unspecified: Secondary | ICD-10-CM | POA: Diagnosis not present

## 2018-08-26 DIAGNOSIS — G4730 Sleep apnea, unspecified: Secondary | ICD-10-CM | POA: Insufficient documentation

## 2018-08-26 DIAGNOSIS — M5030 Other cervical disc degeneration, unspecified cervical region: Secondary | ICD-10-CM | POA: Diagnosis not present

## 2018-08-26 DIAGNOSIS — Z888 Allergy status to other drugs, medicaments and biological substances status: Secondary | ICD-10-CM | POA: Insufficient documentation

## 2018-08-26 DIAGNOSIS — H5462 Unqualified visual loss, left eye, normal vision right eye: Secondary | ICD-10-CM | POA: Insufficient documentation

## 2018-08-26 MED ORDER — TRAMADOL HCL 50 MG PO TABS: 50.0000 mg | ORAL_TABLET | Freq: Two times a day (BID) | ORAL | 2 refills | Status: DC

## 2018-08-26 NOTE — Progress Notes (Signed)
Nursing Pain Medication Assessment:  Safety precautions to be maintained throughout the outpatient stay will include: orient to surroundings, keep bed in low position, maintain call bell within reach at all times, provide assistance with transfer out of bed and ambulation.   Medication Inspection Compliance: Pill count conducted under aseptic conditions, in front of the patient. Neither the pills nor the bottle was removed from the patient's sight at any time. Once count was completed pills were immediately returned to the patient in their original bottle.  Medication: Tramadol (Ultram) Pill/Patch Count: 6 of 62 pills remain Pill/Patch Appearance: Markings consistent with prescribed medication Bottle Appearance: Standard pharmacy container. Clearly labeled. Filled Date: 10 / 21 / 2019 Last Medication intake:  Today

## 2018-08-26 NOTE — Patient Instructions (Addendum)
____________________________________________________________________________________________  Preparing for your procedure (without sedation)  Instructions: . Oral Intake: Do not eat or drink anything for at least 3 hours prior to your procedure. . Transportation: Unless otherwise stated by your physician, you may drive yourself after the procedure. . Blood Pressure Medicine: Take your blood pressure medicine with a sip of water the morning of the procedure. . Blood thinners: Notify our staff if you are taking any blood thinners. Depending on which one you take, there will be specific instructions on how and when to stop it. . Diabetics on insulin: Notify the staff so that you can be scheduled 1st case in the morning. If your diabetes requires high dose insulin, take only  of your normal insulin dose the morning of the procedure and notify the staff that you have done so. . Preventing infections: Shower with an antibacterial soap the morning of your procedure.  . Build-up your immune system: Take 1000 mg of Vitamin C with every meal (3 times a day) the day prior to your procedure. [name] Antibiotics: Inform the staff if you have a condition or reason that requires you to take antibiotics before dental procedures. . Pregnancy: If you are pregnant, call and cancel the procedure. . Sickness: If you have a cold, fever, or any active infections, call and cancel the procedure. . Arrival: You must be in the facility at least 30 minutes prior to your scheduled procedure. . Children: Do not bring any children with you. . Dress appropriately: Bring dark clothing that you would not mind if they get stained. . Valuables: Do not bring any jewelry or valuables.  Procedure appointments are reserved for interventional treatments only. [name] No Prescription Refills. . No medication changes will be discussed during procedure appointments. . No disability issues will be discussed.  Reasons to call and reschedule or  cancel your procedure: (Following these recommendations will minimize the risk of a serious complication.) . Surgeries: Avoid having procedures within 2 weeks of any surgery. (Avoid for 2 weeks before or after any surgery). . Flu Shots: Avoid having procedures within 2 weeks of a flu shots or . (Avoid for 2 weeks before or after immunizations). . Barium: Avoid having a procedure within 7-10 days after having had a radiological study involving the use of radiological contrast. (Myelograms, Barium swallow or enema study). . Heart attacks: Avoid any elective procedures or surgeries for the initial 6 months after a "Myocardial Infarction" (Heart Attack). . Blood thinners: It is imperative that you stop these medications before procedures. Let us know if you if you take any blood thinner.  . Infection: Avoid procedures during or within two weeks of an infection (including chest colds or gastrointestinal problems). Symptoms associated with infections include: Localized redness, fever, chills, night sweats or profuse sweating, burning sensation when voiding, cough, congestion, stuffiness, runny nose, sore throat, diarrhea, nausea, vomiting, cold or Flu symptoms, recent or current infections. It is specially important if the infection is over the area that we intend to treat. [name] Heart and lung problems: Symptoms that may suggest an active cardiopulmonary problem include: cough, chest pain, breathing difficulties or shortness of breath, dizziness, ankle swelling, uncontrolled high or unusually low blood pressure, and/or palpitations. If you are experiencing any of these symptoms, cancel your procedure and contact your primary care physician for an evaluation.  Remember:  Regular Business hours are:  Monday to Thursday 8:00 AM to 4:00 PM  Provider's Schedule: Milinda Pointer, MD:  Procedure days: Tuesday and Thursday 7:30 AM to  4:00 PM  Gillis Santa, MD:  Procedure days: Monday and Wednesday 7:30 AM to 4:00  PM ____________________________________________________________________________________________    ______________________________________________________________________________________________  Specialty Pain Scale  Introduction:  There are significant differences in how pain is reported. The word pain usually refers to physical pain, but it is also a common synonym of suffering. The medical community uses a scale from 0 (zero) to 10 (ten) to report pain level. Zero (0) is described as "no pain", while ten (10) is described as "the worse pain you can imagine". The problem with this scale is that physical pain is reported along with suffering. Suffering refers to mental pain, or more often yet it refers to any unpleasant feeling, emotion or aversion associated with the perception of harm or threat of harm. It is the psychological component of pain.  Pain Specialists prefer to separate the two components. The pain scale used by this practice is the Verbal Numerical Rating Scale (VNRS-11). This scale is for the physical pain only. DO NOT INCLUDE how your pain psychologically affects you. This scale is for adults 82 years of age and older. It has 11 (eleven) levels. The 1st level is 0/10. This means: "right now, I have no pain". In the context of pain management, it also means: "right now, my physical pain is under control with the current therapy".  General Information:  The scale should reflect your current level of pain. Unless you are specifically asked for the level of your worst pain, or your average pain. If you are asked for one of these two, then it should be understood that it is over the past 24 hours.  Levels 1 (one) through 5 (five) are described below, and can be treated as an outpatient. Ambulatory pain management facilities such as ours are more than adequate to treat these levels. Levels 6 (six) through 10 (ten) are also described below, however, these must be treated as a hospitalized  patient. While levels 6 (six) and 7 (seven) may be evaluated at an urgent care facility, levels 8 (eight) through 10 (ten) constitute medical emergencies and as such, they belong in a hospital's emergency department. When having these levels (as described below), do not come to our office. Our facility is not equipped to manage these levels. Go directly to an urgent care facility or an emergency department to be evaluated.  Definitions:  Activities of Daily Living (ADL): Activities of daily living (ADL or ADLs) is a term used in healthcare to refer to people's daily self-care activities. Health professionals often use a person's ability or inability to perform ADLs as a measurement of their functional status, particularly in regard to people post injury, with disabilities and the elderly. There are two ADL levels: Basic and Instrumental. Basic Activities of Daily Living (BADL  or BADLs) consist of self-care tasks that include: Bathing and showering; personal hygiene and grooming (including brushing/combing/styling hair); dressing; Toilet hygiene (getting to the toilet, cleaning oneself, and getting back up); eating and self-feeding (not including cooking or chewing and swallowing); functional mobility, often referred to as "transferring", as measured by the ability to walk, get in and out of bed, and get into and out of a chair; the broader definition (moving from one place to another while performing activities) is useful for people with different physical abilities who are still able to get around independently. Basic ADLs include the things many people do when they get up in the morning and get ready to go out of the house: get out  of bed, go to the toilet, bathe, dress, groom, and eat. On the average, loss of function typically follows a particular order. Hygiene is the first to go, followed by loss of toilet use and locomotion. The last to go is the ability to eat. When there is only one remaining area in  which the person is independent, there is a 62.9% chance that it is eating and only a 3.5% chance that it is hygiene. Instrumental Activities of Daily Living (IADL or IADLs) are not necessary for fundamental functioning, but they let an individual live independently in a community. IADL consist of tasks that include: cleaning and maintaining the house; home establishment and maintenance; care of others (including selecting and supervising caregivers); care of pets; child rearing; managing money; managing financials (investments, etc.); meal preparation and cleanup; shopping for groceries and necessities; moving within the community; safety procedures and emergency responses; health management and maintenance (taking prescribed medications); and using the telephone or other form of communication.  Instructions:  Most patients tend to report their pain as a combination of two factors, their physical pain and their psychosocial pain. This last one is also known as "suffering" and it is reflection of how physical pain affects you socially and psychologically. From now on, report them separately.  From this point on, when asked to report your pain level, report only your physical pain. Use the following table for reference.  Pain Clinic Pain Levels (0-5/10)  Pain Level Score  Description  No Pain 0   Mild pain 1 Nagging, annoying, but does not interfere with basic activities of daily living (ADL). Patients are able to eat, bathe, get dressed, toileting (being able to get on and off the toilet and perform personal hygiene functions), transfer (move in and out of bed or a chair without assistance), and maintain continence (able to control bladder and bowel functions). Blood pressure and heart rate are unaffected. A normal heart rate for a healthy adult ranges from 60 to 100 bpm (beats per minute).   Mild to moderate pain 2 Noticeable and distracting. Impossible to hide from other people. More frequent  flare-ups. Still possible to adapt and function close to normal. It can be very annoying and may have occasional stronger flare-ups. With discipline, patients may get used to it and adapt.   Moderate pain 3 Interferes significantly with activities of daily living (ADL). It becomes difficult to feed, bathe, get dressed, get on and off the toilet or to perform personal hygiene functions. Difficult to get in and out of bed or a chair without assistance. Very distracting. With effort, it can be ignored when deeply involved in activities.   Moderately severe pain 4 Impossible to ignore for more than a few minutes. With effort, patients may still be able to manage work or participate in some social activities. Very difficult to concentrate. Signs of autonomic nervous system discharge are evident: dilated pupils (mydriasis); mild sweating (diaphoresis); sleep interference. Heart rate becomes elevated (>115 bpm). Diastolic blood pressure (lower number) rises above 100 mmHg. Patients find relief in laying down and not moving.   Severe pain 5 Intense and extremely unpleasant. Associated with frowning face and frequent crying. Pain overwhelms the senses.  Ability to do any activity or maintain social relationships becomes significantly limited. Conversation becomes difficult. Pacing back and forth is common, as getting into a comfortable position is nearly impossible. Pain wakes you up from deep sleep. Physical signs will be obvious: pupillary dilation; increased sweating; goosebumps; brisk reflexes; cold,  clammy hands and feet; nausea, vomiting or dry heaves; loss of appetite; significant sleep disturbance with inability to fall asleep or to remain asleep. When persistent, significant weight loss is observed due to the complete loss of appetite and sleep deprivation.  Blood pressure and heart rate becomes significantly elevated. Caution: If elevated blood pressure triggers a pounding headache associated with blurred  vision, then the patient should immediately seek attention at an urgent or emergency care unit, as these may be signs of an impending stroke.    Emergency Department Pain Levels (6-10/10)  Emergency Room Pain 6 Severely limiting. Requires emergency care and should not be seen or managed at an outpatient pain management facility. Communication becomes difficult and requires great effort. Assistance to reach the emergency department may be required. Facial flushing and profuse sweating along with potentially dangerous increases in heart rate and blood pressure will be evident.   Distressing pain 7 Self-care is very difficult. Assistance is required to transport, or use restroom. Assistance to reach the emergency department will be required. Tasks requiring coordination, such as bathing and getting dressed become very difficult.   Disabling pain 8 Self-care is no longer possible. At this level, pain is disabling. The individual is unable to do even the most "basic" activities such as walking, eating, bathing, dressing, transferring to a bed, or toileting. Fine motor skills are lost. It is difficult to think clearly.   Incapacitating pain 9 Pain becomes incapacitating. Thought processing is no longer possible. Difficult to remember your own name. Control of movement and coordination are lost.   The worst pain imaginable 10 At this level, most patients pass out from pain. When this level is reached, collapse of the autonomic nervous system occurs, leading to a sudden drop in blood pressure and heart rate. This in turn results in a temporary and dramatic drop in blood flow to the brain, leading to a loss of consciousness. Fainting is one of the body's self defense mechanisms. Passing out puts the brain in a calmed state and causes it to shut down for a while, in order to begin the healing process.    Summary: 1. Refer to this scale when providing [place] with your pain level. 2. Be accurate and careful when  reporting your pain level. This will help with your care. 3. Over-reporting your pain level will lead to loss of credibility. 4. Even a level of 1/10 means that there is pain and will be treated at our facility. 5. High, inaccurate reporting will be documented as "Symptom Exaggeration", leading to loss of credibility and suspicions of possible secondary gains such as obtaining more narcotics, or wanting to appear disabled, for fraudulent reasons. 6. Only pain levels of 5 or below will be seen at our facility. 7. Pain levels of 6 and above will be sent to the Emergency Department and the appointment cancelled. ______________________________________________________________________________________________

## 2018-08-27 ENCOUNTER — Telehealth: Payer: Self-pay

## 2018-08-27 NOTE — Telephone Encounter (Signed)
Verified with [facility] that Tramadol has not been filled. This prescription cancelled. New script will be sent to [facility].

## 2018-08-27 NOTE — Telephone Encounter (Addendum)
Her scripts were called in to [facility] and they must go through [facility]. [facility] health services) They handle all of her prescriptions. She will run out Saturday.  The fax number is [phone] Attn: Dr. Leticia Penna.[name]

## 2018-10-15 ENCOUNTER — Other Ambulatory Visit: Payer: Self-pay

## 2018-10-15 ENCOUNTER — Ambulatory Visit
Admission: RE | Admit: 2018-10-15 | Discharge: 2018-10-15 | Disposition: A | Discharge status: Home / Self Care | Payer: Medicare (Managed Care) | Source: Ambulatory Visit | Attending: Pain Medicine | Admitting: Pain Medicine

## 2018-10-15 ENCOUNTER — Encounter: Payer: Self-pay | Admitting: Pain Medicine

## 2018-10-15 ENCOUNTER — Ambulatory Visit (HOSPITAL_BASED_OUTPATIENT_CLINIC_OR_DEPARTMENT_OTHER): Payer: Medicare (Managed Care) | Admitting: Pain Medicine

## 2018-10-15 VITALS — BP 143/80 | HR 62 | Temp 98.1°F | Resp 18 | Ht 64.0 in | Wt 159.0 lb

## 2018-10-15 DIAGNOSIS — M4802 Spinal stenosis, cervical region: Secondary | ICD-10-CM

## 2018-10-15 DIAGNOSIS — M542 Cervicalgia: Secondary | ICD-10-CM

## 2018-10-15 DIAGNOSIS — M5412 Radiculopathy, cervical region: Secondary | ICD-10-CM | POA: Diagnosis present

## 2018-10-15 DIAGNOSIS — M4310 Spondylolisthesis, site unspecified: Secondary | ICD-10-CM | POA: Insufficient documentation

## 2018-10-15 DIAGNOSIS — M4722 Other spondylosis with radiculopathy, cervical region: Secondary | ICD-10-CM

## 2018-10-15 DIAGNOSIS — G4486 Cervicogenic headache: Secondary | ICD-10-CM

## 2018-10-15 DIAGNOSIS — R51 Headache: Secondary | ICD-10-CM | POA: Insufficient documentation

## 2018-10-15 DIAGNOSIS — M5030 Other cervical disc degeneration, unspecified cervical region: Secondary | ICD-10-CM | POA: Insufficient documentation

## 2018-10-15 MED ORDER — LIDOCAINE HCL 2 % IJ SOLN
INTRAMUSCULAR | Status: AC
  Filled 2018-10-15: qty 20

## 2018-10-15 MED ORDER — LIDOCAINE HCL 2 % IJ SOLN
20.0000 mL | Freq: Once | INTRAMUSCULAR | Status: AC
  Administered 2018-10-15: 400 mg

## 2018-10-15 MED ORDER — DEXAMETHASONE SODIUM PHOSPHATE 10 MG/ML IJ SOLN
10.0000 mg | Freq: Once | INTRAMUSCULAR | Status: AC
  Administered 2018-10-15: 10 mg

## 2018-10-15 MED ORDER — IOPAMIDOL (ISOVUE-M 200) INJECTION 41%
10.0000 mL | Freq: Once | INTRAMUSCULAR | Status: AC
  Administered 2018-10-15: 10 mL via EPIDURAL
  Filled 2018-10-15: qty 10

## 2018-10-15 MED ORDER — SODIUM CHLORIDE (PF) 0.9 % IJ SOLN
INTRAMUSCULAR | Status: AC
  Filled 2018-10-15: qty 10

## 2018-10-15 MED ORDER — ROPIVACAINE HCL 2 MG/ML IJ SOLN
INTRAMUSCULAR | Status: AC
  Filled 2018-10-15: qty 10

## 2018-10-15 MED ORDER — SODIUM CHLORIDE 0.9% FLUSH
1.0000 mL | Freq: Once | INTRAVENOUS | Status: AC
  Administered 2018-10-15: 1 mL

## 2018-10-15 MED ORDER — DEXAMETHASONE SODIUM PHOSPHATE 10 MG/ML IJ SOLN
INTRAMUSCULAR | Status: AC
  Filled 2018-10-15: qty 1

## 2018-10-15 MED ORDER — ROPIVACAINE HCL 2 MG/ML IJ SOLN
1.0000 mL | Freq: Once | INTRAMUSCULAR | Status: AC
  Administered 2018-10-15: 1 mL via EPIDURAL

## 2018-10-15 NOTE — Patient Instructions (Signed)

## 2018-10-15 NOTE — Progress Notes (Signed)
Patient's Name: [name]  MRN: [id number]  Referring Provider: Milinda Pointer, MD  DOB: 13-Jan-1926  PCP: System, [place] Not In  DOS: 10/15/2018  Note by: Gaspar Cola, MD  Service setting: Ambulatory outpatient  Specialty: Interventional Pain Management  Patient type: Established  Location: [facility] (AMB) Pain Management Facility  Visit type: Interventional Procedure   Primary Reason for Visit: Interventional Pain Management Treatment. CC: Procedure (cervical facet )  Procedure:          Anesthesia, Analgesia, Anxiolysis:  Type: Diagnostic, Inter-Laminar, Cervical Epidural Steroid Injection  #2  Region: Posterior Cervico-thoracic Region Level: C7-T1 Laterality: Right-Sided Paramedial  Type: Local Anesthesia Indication(s): Analgesia         Route: Infiltration ([place]/IM) IV Access: Declined Sedation: Declined  Local Anesthetic: Lidocaine 1-2%  Position: Prone with head of the table was raised to facilitate breathing.   Indications: 1. DDD (degenerative disc disease), cervical   2. Cervical radiculitis (Bilateral)   3. 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 Cervical radiculopathy due to osteoarthritis of spine   5. Cervical  Grade 1 Anterolisthesis of C4/5, C5/6, & C7/T1   6. Cervicalgia   7. Cervicogenic headache    Pain Score: Pre-procedure: 9 /10 Post-procedure: 7 /10  Pre-op Assessment:  [name] is a 85 y.o. (year old), female patient, seen today for interventional treatment. She  has a past surgical history that includes Cholecystectomy; Appendectomy; Abdominal hysterectomy; Vulvectomy; Replacement total knee (Left); Image guided sinus surgery (N/A, 06/20/2015); Sphenoidectomy (Bilateral, 06/20/2015); Abdominal hysterectomy; and uterine cancer. [name] has a current medication list which includes the following prescription(s): alendronate, aspirin-salicylamide-caffeine, atorvastatin, brimonidine, brinzolamide, calcium polycarbophil, fluticasone,  hydrochlorothiazide, latanoprost, lisinopril, meclizine, melatonin-pyridoxine, memantine, mirtazapine, multivitamin with minerals, preservision areds, mupirocin ointment, nitrofurantoin (macrocrystal-monohydrate), omega-3 fatty acids, omeprazole, oxybutynin, rosuvastatin, sertraline, sertraline hcl, tramadol, travoprost (benzalkonium), and vitamin d (cholecalciferol). Her primarily concern today is the Procedure (cervical facet )  The patient comes in today indicating that her right side is hurting more and she will like that one done.  She is complaining about a lot of discomfort going towards the area of the right shoulder.  Today she is also complaining about pain in the area of the tailbone and is interested in office working on that in the near future.  Initial Vital Signs:  Pulse/HCG Rate: 62ECG Heart Rate: 80 Temp: 98.1 F (36.7 C) Resp: 18 BP: 118/60 SpO2: 96 %  BMI: Estimated body mass index is 27.29 kg/m as calculated from the following:   Height as of this encounter: 5\' 4"  (1.626 m).   Weight as of this encounter: 159 lb (72.1 kg).  Risk Assessment: Allergies: Reviewed. She is allergic to [employer] tartrate] and codeine.  Allergy Precautions: None required Coagulopathies: Reviewed. None identified.  Blood-thinner therapy: None at this time Active Infection(s): Reviewed. None identified. [name] is afebrile  Site Confirmation: [name] was asked to confirm the procedure and laterality before marking the site Procedure checklist: Completed Consent: Before the procedure and under the influence of no sedative(s), amnesic(s), or anxiolytics, the patient was informed of the treatment options, risks and possible complications. To fulfill our ethical and legal obligations, as recommended by the American Medical Association's Code of Ethics, I have informed the patient of my clinical impression; the nature and purpose of the treatment or procedure; the risks, benefits, and  possible complications of the intervention; the alternatives, including doing nothing; the risk(s) and benefit(s) of the alternative treatment(s) or procedure(s); and the  risk(s) and benefit(s) of doing nothing. The patient was provided information about the general risks and possible complications associated with the procedure. These may include, but are not limited to: failure to achieve desired goals, infection, bleeding, organ or nerve damage, allergic reactions, paralysis, and death. In addition, the patient was informed of those risks and complications associated to Spine-related procedures, such as failure to decrease pain; infection (i.e.: Meningitis, epidural or intraspinal abscess); bleeding (i.e.: epidural hematoma, subarachnoid hemorrhage, or any other type of intraspinal or peri-dural bleeding); organ or nerve damage (i.e.: Any type of peripheral nerve, nerve root, or spinal cord injury) with subsequent damage to sensory, motor, and/or autonomic systems, resulting in permanent pain, numbness, and/or weakness of one or several areas of the body; allergic reactions; (i.e.: anaphylactic reaction); and/or death. Furthermore, the patient was informed of those risks and complications associated with the medications. These include, but are not limited to: allergic reactions (i.e.: anaphylactic or anaphylactoid reaction(s)); adrenal axis suppression; blood sugar elevation that in diabetics may result in ketoacidosis or comma; water retention that in patients with history of congestive heart failure may result in shortness of breath, pulmonary edema, and decompensation with resultant heart failure; weight gain; swelling or edema; medication-induced neural toxicity; particulate matter embolism and blood vessel occlusion with resultant organ, and/or nervous system infarction; and/or aseptic necrosis of one or more joints. Finally, the patient was informed that Medicine is not an exact science; therefore, there  is also the possibility of unforeseen or unpredictable risks and/or possible complications that may result in a catastrophic outcome. The patient indicated having understood very clearly. We have given the patient no guarantees and we have made no promises. Enough time was given to the patient to ask questions, all of which were answered to the patient's satisfaction. Ms. Newill has indicated that she wanted to continue with the procedure. Attestation: I, the ordering provider, attest that I have discussed with the patient the benefits, risks, side-effects, alternatives, likelihood of achieving goals, and potential problems during recovery for the procedure that I have provided informed consent. Date  Time: 10/15/2018 10:14 AM  Pre-Procedure Preparation:  Monitoring: As per clinic protocol. Respiration, ETCO2, SpO2, BP, heart rate and rhythm monitor placed and checked for adequate function Safety Precautions: Patient was assessed for positional comfort and pressure points before starting the procedure. Time-out: I initiated and conducted the "Time-out" before starting the procedure, as per protocol. The patient was asked to participate by confirming the accuracy of the "Time Out" information. Verification of the correct person, site, and procedure were performed and confirmed by me, the nursing staff, and the patient. "Time-out" conducted as per Joint Commission's Universal Protocol (UP.01.01.01). Time: 1100  Description of Procedure:          Target Area: For Epidural Steroid injections the target is the interlaminar space, initially targeting the lower border of the superior vertebral body lamina. Approach: Paramedial approach. Area Prepped: Entire PosteriorCervical Region Prepping solution: ChloraPrep (2% chlorhexidine gluconate and 70% isopropyl alcohol) Safety Precautions: Aspiration looking for blood return was conducted prior to all injections. At no point did we inject any substances, as a  needle was being advanced. No attempts were made at seeking any paresthesias. Safe injection practices and needle disposal techniques used. Medications properly checked for expiration dates. SDV (single dose vial) medications used. Description of the Procedure: Protocol guidelines were followed. The procedure needle was introduced through the skin, ipsilateral to the reported pain, and advanced to the target area. Bone was contacted  and the needle walked caudad, until the lamina was cleared. The epidural space was identified using "loss-of-resistance technique" with 2-3 ml of PF-NaCl (0.9% NSS), in a 5cc LOR glass syringe. Vitals:   10/15/18 1056 10/15/18 1100 10/15/18 1105 10/15/18 1108  BP: (!) 147/62 (!) 147/68 (!) 118/106 (!) 143/80  Pulse:      Resp: 16 19 13 18   Temp:      SpO2: 94% 97% 97% 94%  Weight:      Height:        Start Time: 1100 hrs. End Time: 1107 hrs. Materials:  Needle(s) Type: Epidural needle Gauge: 17G Length: 3.5-in Medication(s): Please see orders for medications and dosing details.  Imaging Guidance (Spinal):          Type of Imaging Technique: Fluoroscopy Guidance (Spinal) Indication(s): Assistance in needle guidance and placement for procedures requiring needle placement in or near specific anatomical locations not easily accessible without such assistance. Exposure Time: Please see nurses notes. Contrast: Before injecting any contrast, we confirmed that the patient did not have an allergy to iodine, shellfish, or radiological contrast. Once satisfactory needle placement was completed at the desired level, radiological contrast was injected. Contrast injected under live fluoroscopy. No contrast complications. See chart for type and volume of contrast used. Fluoroscopic Guidance: I was personally present during the use of fluoroscopy. "Tunnel Vision Technique" used to obtain the best possible view of the target area. Parallax error corrected before commencing the  procedure. "Direction-depth-direction" technique used to introduce the needle under continuous pulsed fluoroscopy. Once target was reached, antero-posterior, oblique, and lateral fluoroscopic projection used confirm needle placement in all planes. Images permanently stored in EMR. Interpretation: I personally interpreted the imaging intraoperatively. Adequate needle placement confirmed in multiple planes. Appropriate spread of contrast into desired area was observed. No evidence of afferent or efferent intravascular uptake. No intrathecal or subarachnoid spread observed. Permanent images saved into the patient's record.  Antibiotic Prophylaxis:   Anti-infectives (From admission, onward)   None     Indication(s): None identified  Post-operative Assessment:  Post-procedure Vital Signs:  Pulse/HCG Rate: 6273 Temp: 98.1 F (36.7 C) Resp: 18 BP: (!) 143/80 SpO2: 94 %  EBL: None  Complications: No immediate post-treatment complications observed by team, or reported by patient.  Note: The patient tolerated the entire procedure well. A repeat set of vitals were taken after the procedure and the patient was kept under observation following institutional policy, for this type of procedure. Post-procedural neurological assessment was performed, showing return to baseline, prior to discharge. The patient was provided with post-procedure discharge instructions, including a section on how to identify potential problems. Should any problems arise concerning this procedure, the patient was given instructions to immediately contact us, at any time, without hesitation. In any case, we plan to contact the patient by telephone for a follow-up status report regarding this interventional procedure.  Comments:  No additional relevant information.  Plan of Care   Possible POC:  The patient has indicated that she is interested in having also work with her tailbone pain next.  However, today she is indicating  that her primary pain is that of the neck and right shoulder, then followed by her tailbone pain.  If today's procedure does not help the shoulder pain, we may have to do that one as a separate procedure.   Imaging Orders     DG C-Arm 1-60 Min-No Report  Procedure Orders     Cervical Epidural Injection  Medications ordered for procedure:  Meds ordered this encounter  Medications  . iopamidol (ISOVUE-M) 41 % intrathecal injection 10 mL    Must be Myelogram-compatible. If not available, you may substitute with a water-soluble, non-ionic, hypoallergenic, myelogram-compatible radiological contrast medium.  [name] lidocaine (XYLOCAINE) 2 % (with pres) injection 400 mg  . sodium chloride flush (NS) 0.9 % injection 1 mL  . ropivacaine (PF) 2 mg/mL (0.2%) (NAROPIN) injection 1 mL  . dexamethasone (DECADRON) injection 10 mg   Medications administered: We administered iopamidol, lidocaine, sodium chloride flush, ropivacaine (PF) 2 mg/mL (0.2%), and dexamethasone.  See the medical record for exact dosing, route, and time of administration.  Disposition: Discharge home  Discharge Date & Time: 10/15/2018; 1121 hrs.   Physician-requested Follow-up: Return for post-procedure eval (2 wks), w/ Dr. Dossie Arbour.  Future Appointments  Date Time Provider [place]  10/28/2018  1:45 PM Milinda Pointer, MD [facility] None   Primary Care Physician: System, Pcp Not In Location: [facility] Note by: Gaspar Cola, MD Date: 10/15/2018; Time: 11:25 AM  Disclaimer:  Medicine is not an exact science. The only guarantee in medicine is that nothing is guaranteed. It is important to note that the decision to proceed with this intervention was based on the information collected from the patient. The Data and conclusions were drawn from the patient's questionnaire, the interview, and the physical examination. Because the information was provided in large part by the patient, it cannot  be guaranteed that it has not been purposely or unconsciously manipulated. Every effort has been made to obtain as much relevant data as possible for this evaluation. It is important to note that the conclusions that lead to this procedure are derived in large part from the available data. Always take into account that the treatment will also be dependent on availability of resources and existing treatment guidelines, considered by other Pain Management Practitioners as being common knowledge and practice, at the time of the intervention. For Medico-Legal purposes, it is also important to point out that variation in procedural techniques and pharmacological choices are the acceptable norm. The indications, contraindications, technique, and results of the above procedure should only be interpreted and judged by a Board-Certified Interventional Pain Specialist with extensive familiarity and expertise in the same exact procedure and technique.

## 2018-10-15 NOTE — Progress Notes (Signed)
Safety precautions to be maintained throughout the outpatient stay will include: orient to surroundings, keep bed in low position, maintain call bell within reach at all times, provide assistance with transfer out of bed and ambulation.  

## 2018-10-16 ENCOUNTER — Telehealth: Payer: Self-pay

## 2018-10-16 NOTE — Telephone Encounter (Signed)
Post procedure phone call.  Aide asked [place] to call back and speak to husband.

## 2018-10-26 NOTE — Progress Notes (Deleted)
Patient's Name: [name]  MRN: [id number]  Referring Provider: No ref. provider found  DOB: 06/03/1926  PCP: System, Pcp Not In  DOS: 10/28/2018  Note by: Gaspar Cola, MD  Service setting: Ambulatory outpatient  Specialty: Interventional Pain Management  Location: [facility] (AMB) Pain Management Facility    Patient type: Established   Primary Reason(s) for Visit: Encounter for post-procedure evaluation of chronic illness with mild to moderate exacerbation CC: No chief complaint on file.  HPI  [name] is a 85 y.o. year old, female patient, who comes today for a post-procedure evaluation. She has Dizziness; Essential hypertension, benign; History of frequent urinary tract infections; Hypercholesterolemia; Pulmonary hypertension ([place]); Cervicogenic headache; Loss of vision; Vision loss, left eye; Chest pain; DOE (dyspnea on exertion); Cervicalgia; Skin lesion of breast; Elevated alkaline phosphatase level; Cough; Pain of left thumb; URI (upper respiratory infection); Frequent falls; Hyperglycemia; Health care maintenance; Vertigo; Hyperlipidemia; GERD (gastroesophageal reflux disease); Dementia ([place]); Abnormal levels of other serum enzymes; Bladder prolapse, female, acquired; Chronic headaches; Disorder of breast; Frequency of micturition; Major depressive disorder, single episode; Mixed stress and urge urinary incontinence; OAB (overactive bladder); Osteoarthritis of shoulders (Bilateral); Pain of finger of left hand; Rectocele; Sleep disorder; Urethral prolapse; Vaginal atrophy; Dysuria; Anxiety disorder; Depression with anxiety; Other forms of dyspnea; Essential (primary) hypertension; Encounter for general adult medical examination without abnormal findings; Personal history of urinary infection; Pure hypercholesterolemia; 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; Visual loss; Other secondary pulmonary hypertension ([place]); Chronic shoulder pain (Primary Area of  Pain) (Bilateral) (R>L); Chronic knee pain (Secondary Area of Pain) (Right); Chronic hip pain (Tertiary Area of Pain) (Bilateral) (R>L); Chronic pain syndrome; Pharmacologic therapy; Disorder of skeletal system; Problems influencing health status; Hx of total knee replacement (Left); DDD (degenerative disc disease), cervical; Spondylolisthesis, cervical region (Multilevel); Spondylolisthesis, cervicothoracic region (Multilevel); 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 Her primarily concern today is the No chief complaint on file.  Pain Assessment: Location:     Radiating:   Onset:   Duration:   Quality:   Severity:  /10 (subjective, self-reported pain score)  Note: Reported level is compatible with observation.                         When using our objective Pain Scale, levels between 6 and 10/10 are said to belong in an emergency room, as it progressively worsens from a 6/10, described as severely limiting, requiring emergency care not usually available at an outpatient pain management facility. At a 6/10 level, communication becomes difficult and requires great effort. Assistance to reach the emergency department may be required. Facial flushing and profuse sweating along with potentially dangerous increases in heart rate and blood pressure will be evident. Effect on ADL:   Timing:   Modifying factors:   BP:    HR:    [name] comes in today for post-procedure evaluation.  On the patient's last visit she indicated interest in having  [place] work on her tailbone pain next. However, at the time, she is indicating that her primary pain was that of the neck and right shoulder,  then followed by her tailbone pain.  Further details on both, my assessment(s), as well as the proposed treatment plan, please see below.  Post-Procedure Assessment  10/15/2018 Procedure: Diagnostic/therapeutic right-sided cervical epidural steroid injection #2 under fluoroscopic guidance, no sedation. Pre-procedure pain score:  9/10 Post-procedure pain score: 7/10 (< 50% relief) Influential Factors: BMI:   Intra-procedural challenges: None observed.         Assessment challenges: None detected.              Reported side-effects: None.        Post-procedural adverse reactions or complications: None reported         Sedation: No sedation used. When no sedatives are used, the analgesic levels obtained are directly associated to the effectiveness of the local anesthetics. However, when sedation is provided, the level of analgesia obtained during the initial 1 hour following the intervention, is believed to be the result of a combination of factors. These factors may include, but are not limited to: 1. The effectiveness of the local anesthetics used. 2. The effects of the analgesic(s) and/or anxiolytic(s) used. 3. The degree of discomfort experienced by the patient at the time of the procedure. 4. The patients ability and reliability in recalling and recording the events. 5. The presence and influence of possible secondary gains and/or psychosocial factors. Reported result: Relief experienced during the 1st hour after the procedure:   (Ultra-Short Term Relief)            Interpretative annotation: Clinically appropriate result. No IV Analgesic or Anxiolytic given, therefore benefits are completely due to Local Anesthetic effects.          Effects of local anesthetic: The analgesic effects attained during this period are directly associated to the localized  infiltration of local anesthetics and therefore cary significant diagnostic value as to the etiological location, or anatomical origin, of the pain. Expected duration of relief is directly dependent on the pharmacodynamics of the local anesthetic used. Long-acting (4-6 hours) anesthetics used.  Reported result: Relief during the next 4 to 6 hour after the procedure:   (Short-Term Relief)            Interpretative annotation: Clinically appropriate result. Analgesia during this period is likely to be Local Anesthetic-related.          Long-term benefit: Defined as the period of time past the expected duration of local anesthetics (1 hour for short-acting and 4-6 hours for long-acting). With the possible exception of prolonged sympathetic blockade from the local anesthetics, benefits during this period are typically attributed to, or associated with, other factors such as analgesic sensory neuropraxia, antiinflammatory effects, or beneficial biochemical changes provided by agents other than the local anesthetics.  Reported result: Extended relief following procedure:   (Long-Term Relief)            Interpretative annotation: Clinically possible results. Good relief. No permanent benefit expected. Inflammation plays a part in the etiology to the pain.          Current benefits: Defined as reported results that persistent at this point in time.   Analgesia: *** %            Function: Somewhat improved ROM: Somewhat improved Interpretative annotation: Recurrence of symptoms. No permanent benefit expected. Effective diagnostic intervention.          Interpretation: Results would suggest a successful diagnostic intervention.                  Plan:  Please see "Plan of Care" for details.                Laboratory Chemistry  Inflammation Markers (CRP: Acute Phase) (ESR: Chronic Phase) Lab Results  Component Value Date   CRP 8 07/14/2018   ESRSEDRATE 22 07/14/2018   LATICACIDVEN 0.77 03/04/2014                          Rheumatology Markers Lab Results  Component Value Date   RF <10 01/28/2014   ANA NEG 01/28/2014                        Renal Markers Lab Results  Component Value Date   BUN 17 07/14/2018   CREATININE 1.00 07/14/2018   BCR 17 07/14/2018   GFRAA 57 (L) 07/14/2018   GFRNONAA 50 (L) 07/14/2018                             Hepatic Markers Lab Results  Component Value Date   AST 16 07/14/2018   ALT 18 10/18/2016   ALBUMIN 4.4 07/14/2018                        Neuropathy Markers Lab Results  Component Value Date   VITAMINB12 278 07/14/2018   HGBA1C 5.8 10/18/2016                        Hematology Parameters Lab Results  Component Value Date   INR 1.07 01/15/2016   LABPROT 14.1 01/15/2016   APTT 33 01/15/2016   PLT 270.0 05/31/2016   HGB 13.4 05/31/2016   HCT 40.0 05/31/2016   DDIMER 0.82 (H) 10/10/2013                        CV Markers Lab Results  Component Value Date   TROPONINI <0.03 03/01/2016                         Note: Lab results reviewed.  Recent Imaging Results   Results for orders placed in visit on 10/15/18  DG C-Arm 1-60 Min-No Report   Narrative Fluoroscopy was utilized by the requesting physician.  No radiographic  interpretation.    Interpretation Report: Fluoroscopy was used during the procedure to assist with needle guidance. The images were interpreted intraoperatively by the requesting physician.  Meds   Current Outpatient Medications:  .  alendronate (FOSAMAX) 70 MG tablet, Take 70 mg by mouth once a week. Take with a full glass of water on an empty stomach., Disp: , Rfl:  .  Aspirin-Salicylamide-Caffeine (BC HEADACHE POWDER PO), Take 1 packet by mouth daily as needed (pain/headache)., Disp: , Rfl:  .  atorvastatin (LIPITOR) 40 MG tablet, TAKE 1 TABLET AT 6 PM, Disp: 90 tablet, Rfl: 1 .  brimonidine (ALPHAGAN P) 0.1 % SOLN, Place 1 drop into both eyes 2 (two) times daily., Disp: , Rfl:  .  brinzolamide (AZOPT) 1 %  ophthalmic suspension, Place 1 drop into the left eye 3 (three) times daily., Disp: 10 mL, Rfl: 12 .  Calcium Polycarbophil (FIBER-LAX PO), Take by mouth., Disp: , Rfl:  .  fluticasone (FLONASE) 50 MCG/ACT nasal spray, Place 2 sprays into both nostrils daily. (Patient taking differently: Place 2 sprays into both nostrils daily as  needed (seasonal allergies). ), Disp: 16 g, Rfl: 1 .  hydrochlorothiazide (MICROZIDE) 12.5 MG capsule, Take 1 capsule (12.5 mg total) by mouth daily., Disp: 30 capsule, Rfl: 3 .  latanoprost (XALATAN) 0.005 % ophthalmic solution, Place 1 drop into both eyes at bedtime., Disp: 2.5 mL, Rfl: 12 .  lisinopril (PRINIVIL,ZESTRIL) 10 MG tablet, Take 1 tablet (10 mg total) by mouth daily., Disp: 30 tablet, Rfl: 5 .  meclizine (ANTIVERT) 25 MG tablet, Take 1 tablet (25 mg total) by mouth 3 (three) times daily as needed for dizziness., Disp: 90 tablet, Rfl: 3 .  Melatonin-Pyridoxine (MELATIN PO), Take 3 mg by mouth., Disp: , Rfl:  .  memantine (NAMENDA) 10 MG tablet, Take 10 mg by mouth 2 (two) times daily. , Disp: , Rfl:  .  mirtazapine (REMERON) 30 MG tablet, Take 30 mg by mouth at bedtime., Disp: , Rfl:  .  Multiple Vitamin (MULTIVITAMIN WITH MINERALS) TABS tablet, Take 1 tablet by mouth daily., Disp: , Rfl:  .  Multiple Vitamins-Minerals (PRESERVISION AREDS) TABS, Take 1 tablet by mouth daily. , Disp: , Rfl:  .  mupirocin ointment (BACTROBAN) 2 %, Apply to affected area on chest and scalp bid x 1 week., Disp: 22 g, Rfl: 0 .  nitrofurantoin, macrocrystal-monohydrate, (MACROBID) 100 MG capsule, Take 1 capsule (100 mg total) by mouth every 12 (twelve) hours., Disp: 14 capsule, Rfl: 0 .  Omega-3 Fatty Acids (FISH OIL CONCENTRATE PO), Take 1 capsule by mouth daily. , Disp: , Rfl:  .  omeprazole (PRILOSEC) 20 MG capsule, Take 1 capsule (20 mg total) by mouth every morning., Disp: 90 capsule, Rfl: 1 .  oxybutynin (OXYTROL) 3.9 MG/24HR, Place 1 patch onto the skin every 3 (three) days.,  Disp: 8 patch, Rfl: 0 .  rosuvastatin (CRESTOR) 20 MG tablet, Take 1 tablet (20 mg total) by mouth at bedtime., Disp: 30 tablet, Rfl: 3 .  sertraline (ZOLOFT) 100 MG tablet, Take 150 mg by mouth daily. , Disp: , Rfl:  .  SERTRALINE HCL PO, Take 100 mg by mouth daily., Disp: , Rfl:  .  traMADol (ULTRAM) 50 MG tablet, Take 1 tablet (50 mg total) by mouth 2 (two) times daily. Must last 30 days., Disp: 60 tablet, Rfl: 2 .  travoprost, benzalkonium, (TRAVATAN) 0.004 % ophthalmic solution, Place 1 drop into both eyes at bedtime., Disp: , Rfl:  .  vitamin D, CHOLECALCIFEROL, 400 UNITS tablet, Take 400 Units by mouth daily., Disp: , Rfl:   ROS  Constitutional: Denies any fever or chills Gastrointestinal: No reported hemesis, hematochezia, vomiting, or acute GI distress Musculoskeletal: Denies any acute onset joint swelling, redness, loss of ROM, or weakness Neurological: No reported episodes of acute onset apraxia, aphasia, dysarthria, agnosia, amnesia, paralysis, loss of coordination, or loss of consciousness  Allergies  Ms. Inglett is allergic to [employer] tartrate] and codeine.  [place]  Drug: Ms. Sieg  reports no history of drug use. Alcohol:  reports no history of alcohol use. Tobacco:  reports that she has never smoked. She has never used smokeless tobacco. Medical:  has a past medical history of Anxiety, Arthritis, Cancer ([place]), Chronic headaches, Cystocele, Dementia ([place]), Depression, GERD (gastroesophageal reflux disease), Glaucoma, Hypercholesterolemia, Hypertension, Macular degeneration, Reactive airway disease, Rectocele, Right bundle branch block, Sleep apnea, Uterine cancer ([place]), Vertigo, and Wears dentures. Surgical: Ms. Morneault  has a past surgical history that includes Cholecystectomy; Appendectomy; Abdominal hysterectomy; Vulvectomy; Replacement total knee (Left); Image guided sinus surgery (N/A, 06/20/2015); Sphenoidectomy (Bilateral, 06/20/2015); Abdominal hysterectomy;  and  uterine cancer. Family: family history includes Cancer in her daughter and father; Heart disease in her brother and mother.  Constitutional Exam  General appearance: Well nourished, well developed, and well hydrated. In no apparent acute distress There were no vitals filed for this visit. BMI Assessment: Estimated body mass index is 27.29 kg/m as calculated from the following:   Height as of 10/15/18: '5\' 4"'$  (1.626 m).   Weight as of 10/15/18: 159 lb (72.1 kg).  BMI interpretation table: BMI level Category Range association with higher incidence of chronic pain  <18 kg/m2 Underweight   18.5-24.9 kg/m2 Ideal body weight   25-29.9 kg/m2 Overweight Increased incidence by 20%  30-34.9 kg/m2 Obese (Class I) Increased incidence by 68%  35-39.9 kg/m2 Severe obesity (Class II) Increased incidence by 136%  >40 kg/m2 Extreme obesity (Class III) Increased incidence by 254%   Patient's current BMI Ideal Body weight  There is no height or weight on file to calculate BMI. Ideal body weight: 54.7 kg (120 lb 9.5 oz) Adjusted ideal body weight: 61.7 kg (135 lb 15.3 oz)   BMI Readings from Last 4 Encounters:  10/15/18 27.29 kg/m  08/26/18 29.05 kg/m  08/13/18 27.81 kg/m  07/27/18 30.00 kg/m   Wt Readings from Last 4 Encounters:  10/15/18 159 lb (72.1 kg)  08/26/18 164 lb (74.4 kg)  08/13/18 162 lb (73.5 kg)  07/27/18 164 lb (74.4 kg)  Psych/Mental status: Alert, oriented x 3 (person, place, & time)       Eyes: PERLA Respiratory: No evidence of acute respiratory distress  Cervical Spine Area Exam  Skin & Axial Inspection: No masses, redness, edema, swelling, or associated skin lesions Alignment: Symmetrical Functional ROM: Unrestricted ROM      Stability: No instability detected Muscle Tone/Strength: Functionally intact. No obvious neuro-muscular anomalies detected. Sensory (Neurological): Unimpaired Palpation: No palpable anomalies              Upper Extremity (UE) Exam    Side: Right  upper extremity  Side: Left upper extremity  Skin & Extremity Inspection: Skin color, temperature, and hair growth are WNL. No peripheral edema or cyanosis. No masses, redness, swelling, asymmetry, or associated skin lesions. No contractures.  Skin & Extremity Inspection: Skin color, temperature, and hair growth are WNL. No peripheral edema or cyanosis. No masses, redness, swelling, asymmetry, or associated skin lesions. No contractures.  Functional ROM: Unrestricted ROM          Functional ROM: Unrestricted ROM          Muscle Tone/Strength: Functionally intact. No obvious neuro-muscular anomalies detected.  Muscle Tone/Strength: Functionally intact. No obvious neuro-muscular anomalies detected.  Sensory (Neurological): Unimpaired          Sensory (Neurological): Unimpaired          Palpation: No palpable anomalies              Palpation: No palpable anomalies              Provocative Test(s):  Phalen's test: deferred Tinel's test: deferred Apley's scratch test (touch opposite shoulder):  Action 1 (Across chest): deferred Action 2 (Overhead): deferred Action 3 (LB reach): deferred   Provocative Test(s):  Phalen's test: deferred Tinel's test: deferred Apley's scratch test (touch opposite shoulder):  Action 1 (Across chest): deferred Action 2 (Overhead): deferred Action 3 (LB reach): deferred    Thoracic Spine Area Exam  Skin & Axial Inspection: No masses, redness, or swelling Alignment: Symmetrical Functional ROM: Unrestricted ROM Stability: No instability  detected Muscle Tone/Strength: Functionally intact. No obvious neuro-muscular anomalies detected. Sensory (Neurological): Unimpaired Muscle strength & Tone: No palpable anomalies  Lumbar Spine Area Exam  Skin & Axial Inspection: No masses, redness, or swelling Alignment: Symmetrical Functional ROM: Unrestricted ROM       Stability: No instability detected Muscle Tone/Strength: Functionally intact. No obvious neuro-muscular  anomalies detected. Sensory (Neurological): Unimpaired Palpation: No palpable anomalies       Provocative Tests: Hyperextension/rotation test: deferred today       Lumbar quadrant test (Kemp's test): deferred today       Lateral bending test: deferred today       Patrick's Maneuver: deferred today                   FABER* test: deferred today                   S-I anterior distraction/compression test: deferred today         S-I lateral compression test: deferred today         S-I Thigh-thrust test: deferred today         S-I Gaenslen's test: deferred today         *(Flexion, ABduction and External Rotation)  Gait & Posture Assessment  Ambulation: Unassisted Gait: Relatively normal for age and body habitus Posture: WNL   Lower Extremity Exam    Side: Right lower extremity  Side: Left lower extremity  Stability: No instability observed          Stability: No instability observed          Skin & Extremity Inspection: Skin color, temperature, and hair growth are WNL. No peripheral edema or cyanosis. No masses, redness, swelling, asymmetry, or associated skin lesions. No contractures.  Skin & Extremity Inspection: Skin color, temperature, and hair growth are WNL. No peripheral edema or cyanosis. No masses, redness, swelling, asymmetry, or associated skin lesions. No contractures.  Functional ROM: Unrestricted ROM                  Functional ROM: Unrestricted ROM                  Muscle Tone/Strength: Functionally intact. No obvious neuro-muscular anomalies detected.  Muscle Tone/Strength: Functionally intact. No obvious neuro-muscular anomalies detected.  Sensory (Neurological): Unimpaired        Sensory (Neurological): Unimpaired        DTR: Patellar: deferred today Achilles: deferred today Plantar: deferred today  DTR: Patellar: deferred today Achilles: deferred today Plantar: deferred today  Palpation: No palpable anomalies  Palpation: No palpable anomalies   Assessment  Primary  Diagnosis & Pertinent Problem List: The primary encounter diagnosis was Chronic shoulder pain (Primary Area of Pain) (Bilateral) (R>L). 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  Status Diagnosis  Controlled Controlled Controlled 1. Chronic shoulder pain (Primary Area of Pain) (Bilateral) (R>L)   2. Cervical radiculitis (Bilateral)   3. Cervicalgia   4. Chronic knee pain (Secondary Area of Pain) (Right)   5. Chronic hip pain (Tertiary Area of Pain) (Bilateral) (R>L)   6. Cervical facet arthropathy (Severe) (Right: C4-5, C5-6)   7. 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 Cervical  Grade 1 Anterolisthesis of C4/5, C5/6, & C7/T1   9. DDD (degenerative disc disease), cervical     Problems updated and reviewed during this visit: No problems updated. Plan of Care  Pharmacotherapy (Medications Ordered): No orders of the defined types were placed in this encounter.  Medications administered today: Areatha Keas had no medications administered during this visit.  Procedure Orders    No procedure(s) ordered today   Lab Orders  No laboratory test(s) ordered today   Imaging Orders  No imaging studies ordered today   Referral Orders  No referral(s) requested today   Interventional management options: Planned, scheduled, and/or pending:   *** Diagnostic bilateral intra-articular shoulder joint injection (glenohumeral joint and acromioclavicular joint) #1 under fluoroscopic guidance, no sedation.   Considering:   Diagnosticbilateral intra-articular shoulder injections Diagnostic bilateral  intra-articular glenohumeral joint injection  Diagnostic bilateral intra-articular acromioclavicular joint injection  Diagnosticbilateral suprascapular nerve blocks Possible bilateral suprascapularRFA Therapeutic left-sided Cervical ESI #2 (100/100/25) He diagnosticright intra-articular knee injection Diagnostic right knee Hyalgan series Diagnostic right genicular nerve block Possible right genicular nerveRFA   Palliative PRN treatment(s):   Diagnostic left-sided cervical epidural steroid injection #2 under fluoroscopic guidance, no sedation    Provider-requested follow-up: No follow-ups on file.  Future Appointments  Date Time Provider [place]  10/28/2018  1:45 PM Milinda Pointer, MD [facility] None   Primary Care Physician: System, Pcp Not In Location: [facility] Note by: Gaspar Cola, MD Date: 10/28/2018; Time: 7:35 AM

## 2018-10-28 ENCOUNTER — Ambulatory Visit: Payer: Self-pay | Admitting: Pain Medicine

## 2019-12-30 ENCOUNTER — Other Ambulatory Visit: Payer: Self-pay

## 2019-12-30 ENCOUNTER — Emergency Department
Admission: EM | Admit: 2019-12-30 | Discharge: 2019-12-30 | Disposition: A | Discharge status: Home / Self Care | Payer: Medicare (Managed Care) | Attending: Emergency Medicine | Admitting: Emergency Medicine

## 2019-12-30 ENCOUNTER — Emergency Department: Payer: Medicare (Managed Care)

## 2019-12-30 DIAGNOSIS — Z96652 Presence of left artificial knee joint: Secondary | ICD-10-CM | POA: Insufficient documentation

## 2019-12-30 DIAGNOSIS — Z8542 Personal history of malignant neoplasm of other parts of uterus: Secondary | ICD-10-CM | POA: Diagnosis not present

## 2019-12-30 DIAGNOSIS — I10 Essential (primary) hypertension: Secondary | ICD-10-CM | POA: Insufficient documentation

## 2019-12-30 DIAGNOSIS — R079 Chest pain, unspecified: Secondary | ICD-10-CM | POA: Insufficient documentation

## 2019-12-30 DIAGNOSIS — J45909 Unspecified asthma, uncomplicated: Secondary | ICD-10-CM | POA: Diagnosis not present

## 2019-12-30 DIAGNOSIS — Z9049 Acquired absence of other specified parts of digestive tract: Secondary | ICD-10-CM | POA: Insufficient documentation

## 2019-12-30 DIAGNOSIS — R05 Cough: Secondary | ICD-10-CM | POA: Insufficient documentation

## 2019-12-30 DIAGNOSIS — F0390 Unspecified dementia without behavioral disturbance: Secondary | ICD-10-CM | POA: Insufficient documentation

## 2019-12-30 DIAGNOSIS — R059 Cough, unspecified: Secondary | ICD-10-CM

## 2019-12-30 DIAGNOSIS — Z760 Encounter for issue of repeat prescription: Secondary | ICD-10-CM

## 2019-12-30 LAB — BASIC METABOLIC PANEL
Anion gap: 8 (ref 5–15)
BUN: 26 mg/dL — ABNORMAL HIGH (ref 8–23)
CO2: 24 mmol/L (ref 22–32)
Calcium: 9.1 mg/dL (ref 8.9–10.3)
Chloride: 109 mmol/L (ref 98–111)
Creatinine, Ser: 1.21 mg/dL — ABNORMAL HIGH (ref 0.44–1.00)
GFR calc Af Amer: 45 mL/min — ABNORMAL LOW (ref >60)
GFR calc non Af Amer: 39 mL/min — ABNORMAL LOW (ref >60)
Glucose, Bld: 112 mg/dL — ABNORMAL HIGH (ref 70–99)
Potassium: 4.7 mmol/L (ref 3.5–5.1)
Sodium: 141 mmol/L (ref 135–145)

## 2019-12-30 LAB — CBC
HCT: 38.4 % (ref 36.0–46.0)
Hemoglobin: 12.3 g/dL (ref 12.0–15.0)
MCH: 32.4 pg (ref 26.0–34.0)
MCHC: 32 g/dL (ref 30.0–36.0)
MCV: 101.1 fL — ABNORMAL HIGH (ref 80.0–100.0)
Platelets: 206 10*3/uL (ref 150–400)
RBC: 3.8 MIL/uL — ABNORMAL LOW (ref 3.87–5.11)
RDW: 12.8 % (ref 11.5–15.5)
WBC: 6.6 10*3/uL (ref 4.0–10.5)
nRBC: 0 % (ref 0.0–0.2)

## 2019-12-30 LAB — TROPONIN I (HIGH SENSITIVITY)
Troponin I (High Sensitivity): 3 ng/L (ref <18)
Troponin I (High Sensitivity): 3 ng/L (ref <18)

## 2019-12-30 MED ORDER — ACETAMINOPHEN 500 MG PO TABS
1000.0000 mg | ORAL_TABLET | Freq: Once | ORAL | Status: AC
  Administered 2019-12-30: 1000 mg via ORAL
  Filled 2019-12-30: qty 2

## 2019-12-30 MED ORDER — LEVOFLOXACIN 500 MG PO TABS: 500.0000 mg | ORAL_TABLET | Freq: Every day | ORAL | 0 refills | Status: AC

## 2019-12-30 MED ORDER — OMEPRAZOLE 20 MG PO CPDR: 20.0000 mg | DELAYED_RELEASE_CAPSULE | Freq: Two times a day (BID) | ORAL | 1 refills | Status: DC

## 2019-12-30 NOTE — ED Triage Notes (Signed)
Pt arrived via [facility] from [facility] with CP. Pt has dementia and is a poor historian but says that she is having 8/10 pain in the center of her chest. Pt received 3 nitroglycerin and 324 of ASA before arrival to ED.

## 2019-12-30 NOTE — ED Provider Notes (Signed)
[facility] Emergency Department Provider Note       Time seen: ----------------------------------------- 10:25 AM on 12/30/2019 -----------------------------------------   I have reviewed the triage vital signs and the nursing notes.  HISTORY   Chief Complaint No chief complaint on file.    HPI [name] is a 86 y.o. female with a history of anxiety, arthritis, dementia, GERD, hypertension, right bundle branch block who presents to the ED for lower chest pain with EKG changes.  Reportedly she has had pain across her lower chest, difficult to determine how long this has been going on due to her dementia.  She complains of mild pain at this time.  Past Medical History:  Diagnosis Date  . Anxiety   . Arthritis   . Cancer [facility])    uterine and questionable vulvar, s/p hysterectomy  . Chronic headaches   . Cystocele   . Dementia ([place])    able to sign own papers  . Depression   . GERD (gastroesophageal reflux disease)   . Glaucoma   . Hypercholesterolemia   . Hypertension   . Macular degeneration   . Reactive airway disease   . Rectocele   . Right bundle branch block   . Sleep apnea   . Uterine cancer ([place])   . Vertigo    none - several yrs  . Wears dentures    full upper and lower    Patient Active Problem List   Diagnosis Date Noted  . Osteoarthritis of glenohumeral joint (Severe) (Bilateral) 08/26/2018  . Osteoarthritis of AC (acromioclavicular) joint (Severe) (Bilateral) 08/26/2018  . Chronic acromioclavicular [facility]) joint pain (Bilateral) 08/26/2018  . Spondylolisthesis, cervical region (Multilevel) 08/13/2018  . Spondylolisthesis, cervicothoracic region (Multilevel) 08/13/2018  . Cervical foraminal stenosis (Severe C4-5 through C6-7) 08/13/2018  . Cervical radiculitis (Bilateral) 08/13/2018  . Cervical  Grade 1 Anterolisthesis of C4/5, C5/6, & C7/T1 08/13/2018  . Cervical facet arthropathy (Severe) (Right: C4-5, C5-6) 08/13/2018   . Cervical facet syndrome (Bilateral) (R>L) 08/13/2018  . Cervical radiculopathy due to osteoarthritis of spine 08/13/2018  . Hx of total knee replacement (Left) 07/27/2018  . DDD (degenerative disc disease), cervical 07/27/2018  . Chronic shoulder pain (Primary Area of Pain) (Bilateral) (R>L) 07/14/2018  . Chronic knee pain (Secondary Area of Pain) (Right) 07/14/2018  . Chronic hip pain [facility] Area of Pain) (Bilateral) (R>L) 07/14/2018  . Chronic pain syndrome 07/14/2018  . Pharmacologic therapy 07/14/2018  . Disorder of skeletal system 07/14/2018  . Problems influencing health status 07/14/2018  . Hyperlipidemia 03/02/2016  . Dementia ([place]) 03/02/2016  . Vertigo 03/01/2016  . Frequent falls 12/04/2014  . Hyperglycemia 12/04/2014  . Health care maintenance 12/04/2014  . Encounter for general adult medical examination without abnormal findings 12/04/2014  . URI (upper respiratory infection) 10/24/2014  . OAB (overactive bladder) 06/17/2014  . Pain of left thumb 05/31/2014  . Pain of finger of left hand 05/31/2014  . Rectocele 03/17/2014  . Vaginal atrophy 03/17/2014  . Cough 03/14/2014  . Bladder prolapse, female, acquired 02/15/2014  . Mixed stress and urge urinary incontinence 02/15/2014  . Urethral prolapse 02/15/2014  . Dysuria 02/15/2014  . Elevated alkaline phosphatase level 01/30/2014  . Abnormal levels of other serum enzymes 01/30/2014  . Frequency of micturition 12/24/2013  . Cervicalgia 12/12/2013  . Skin lesion of breast 12/12/2013  . Disorder of breast 12/12/2013  . Loss of vision 10/11/2013  . Vision loss, left eye 10/11/2013  . Chest pain 10/11/2013  . DOE (dyspnea on  exertion) 10/11/2013  . Anxiety disorder 10/11/2013  . Other forms of dyspnea 10/11/2013  . 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 10/11/2013  . Visual loss 10/11/2013  . Cervicogenic headache 02/28/2013  . History of frequent urinary tract infections  02/01/2013  . Hypercholesterolemia 02/01/2013  . Pulmonary hypertension ([place]) 02/01/2013  . Major depressive disorder, single episode 02/01/2013  . Sleep disorder 02/01/2013  . Personal history of urinary infection 02/01/2013  . Pure hypercholesterolemia 02/01/2013  . Other secondary pulmonary hypertension ([place]) 02/01/2013  . GERD (gastroesophageal reflux disease) 12/14/2012  . Chronic headaches 12/14/2012  . Osteoarthritis of shoulders (Bilateral) 12/14/2012  . Depression with anxiety 12/14/2012  . Dizziness 09/10/2012  . Essential hypertension, benign 09/10/2012  . Essential (primary) hypertension 09/10/2012    Past Surgical History:  Procedure Laterality Date  . ABDOMINAL HYSTERECTOMY    . ABDOMINAL HYSTERECTOMY    . APPENDECTOMY    . CHOLECYSTECTOMY    . IMAGE GUIDED SINUS SURGERY N/A 06/20/2015   Procedure: IMAGE GUIDED SINUS SURGERY;  Surgeon: Clyde Canterbury, MD;  Location: [place];  Service: ENT;  Laterality: N/A;  GAVE DISK TO CE CE  . REPLACEMENT TOTAL KNEE Left   . SPHENOIDECTOMY Bilateral 06/20/2015   Procedure: SPHENOIDECTOMY;  Surgeon: Clyde Canterbury, MD;  Location: [place];  Service: ENT;  Laterality: Bilateral;  . uterine cancer    . VULVECTOMY      Allergies Ambien [zolpidem tartrate] and Codeine  Social History Social History   Tobacco Use  . Smoking status: Never Smoker  . Smokeless tobacco: Never Used  Substance Use Topics  . Alcohol use: No    Alcohol/week: 0.0 standard drinks  . Drug use: No    Review of Systems Constitutional: Negative for fever. Cardiovascular: Positive for chest pain Respiratory: Negative for shortness of breath. Gastrointestinal: Negative for abdominal pain, vomiting and diarrhea. Musculoskeletal: Negative for back pain. Skin: Negative for rash. Neurological: Negative for headaches, focal weakness or numbness.  All systems negative/normal/unremarkable except as stated in the  HPI  ____________________________________________   PHYSICAL EXAM:  VITAL SIGNS: ED Triage Vitals  Enc Vitals Group     BP      Pulse      Resp      Temp      Temp src      SpO2      Weight      Height      Head Circumference      Peak Flow      Pain Score      Pain Loc      Pain Edu?      Excl. in [place]?     Constitutional: Alert and oriented. Well appearing and in no distress. Eyes: Conjunctivae are normal. Normal extraocular movements. Cardiovascular: Normal rate, regular rhythm. No murmurs, rubs, or gallops. Respiratory: Normal respiratory effort without tachypnea nor retractions. Breath sounds are clear and equal bilaterally. No wheezes/rales/rhonchi. Gastrointestinal: Soft and nontender. Normal bowel sounds Musculoskeletal: Nontender with normal range of motion in extremities. No lower extremity tenderness nor edema. Neurologic:  Normal speech and language. No gross focal neurologic deficits are appreciated.  Skin:  Skin is warm, dry and intact. No rash noted. Psychiatric: Mood and affect are normal. Speech and behavior are normal.  ____________________________________________  EKG: Interpreted by me.  Sinus rhythm with a rate of 70 bpm, right bundle branch block, normal axis, normal QT  ____________________________________________  ED COURSE:  As part of  my medical decision making, I reviewed the following data within the [place] History obtained from family if available, nursing notes, old chart and ekg, as well as notes from prior ED visits. Patient presented for chest pain, we will assess with labs and imaging as indicated at this time.   Procedures  [name] was evaluated in Emergency Department on 12/30/2019 for the symptoms described in the history of present illness. She was evaluated in the context of the global COVID-19 pandemic, which necessitated consideration that the patient might be at risk for infection with the SARS-CoV-2  virus that causes COVID-19. Institutional protocols and algorithms that pertain to the evaluation of patients at risk for COVID-19 are in a state of rapid change based on information released by regulatory bodies including the CDC and federal and state organizations. These policies and algorithms were followed during the patient's care in the ED.  ____________________________________________   LABS (pertinent positives/negatives)  Labs Reviewed  BASIC METABOLIC PANEL - Abnormal; Notable for the following components:      Result Value   Glucose, Bld 112 (*)    BUN 26 (*)    Creatinine, Ser 1.21 (*)    GFR calc non Af Amer 39 (*)    GFR calc Af Amer 45 (*)    All other components within normal limits  CBC - Abnormal; Notable for the following components:   RBC 3.80 (*)    MCV 101.1 (*)    All other components within normal limits  TROPONIN I (HIGH SENSITIVITY)  TROPONIN I (HIGH SENSITIVITY)    RADIOLOGY Images were viewed by me  Chest x-ray IMPRESSION:  Ill-defined airspace opacity in each lower lung region, likely foci  of pneumonia. Lungs elsewhere clear. Heart upper normal in size. No  appreciable adenopathy.   Followup PA and lateral chest radiographs recommended in 3-4 weeks  following trial of antibiotic therapy to ensure resolution and  exclude underlying malignancy.  ____________________________________________   DIFFERENTIAL DIAGNOSIS   Musculoskeletal pain, GERD, anxiety, unstable angina, MI  FINAL ASSESSMENT AND PLAN  Chest pain, cough, possible pneumonia   Plan: The patient had presented for chest pain. Patient's labs were overall reassuring. Patient's imaging revealed possible pneumonia in the lower lung regions.  She has had persistent cough.  We will add on Levaquin.  Chest pain seems noncardiac in origin.  She is cleared for outpatient follow-up.   Laurence Aly, MD    Note: This note was generated in part or whole with voice recognition  software. Voice recognition is usually quite accurate but there are transcription errors that can and very often do occur. I apologize for any typographical errors that were not detected and corrected.     Earleen Newport, MD 12/30/19 [phone]

## 2020-06-27 ENCOUNTER — Emergency Department: Payer: Medicare (Managed Care)

## 2020-06-27 ENCOUNTER — Encounter: Payer: Self-pay | Admitting: Emergency Medicine

## 2020-06-27 ENCOUNTER — Other Ambulatory Visit: Payer: Self-pay

## 2020-06-27 ENCOUNTER — Emergency Department
Admission: EM | Admit: 2020-06-27 | Discharge: 2020-06-27 | Disposition: A | Discharge status: Home / Self Care | Payer: Medicare (Managed Care) | Attending: Emergency Medicine | Admitting: Emergency Medicine

## 2020-06-27 DIAGNOSIS — Z96652 Presence of left artificial knee joint: Secondary | ICD-10-CM | POA: Insufficient documentation

## 2020-06-27 DIAGNOSIS — F0390 Unspecified dementia without behavioral disturbance: Secondary | ICD-10-CM | POA: Insufficient documentation

## 2020-06-27 DIAGNOSIS — J45909 Unspecified asthma, uncomplicated: Secondary | ICD-10-CM | POA: Diagnosis not present

## 2020-06-27 DIAGNOSIS — S7002XA Contusion of left hip, initial encounter: Secondary | ICD-10-CM | POA: Insufficient documentation

## 2020-06-27 DIAGNOSIS — W228XXA Striking against or struck by other objects, initial encounter: Secondary | ICD-10-CM | POA: Insufficient documentation

## 2020-06-27 DIAGNOSIS — Y9301 Activity, walking, marching and hiking: Secondary | ICD-10-CM | POA: Insufficient documentation

## 2020-06-27 DIAGNOSIS — S0083XA Contusion of other part of head, initial encounter: Secondary | ICD-10-CM | POA: Diagnosis not present

## 2020-06-27 DIAGNOSIS — T07XXXA Unspecified multiple injuries, initial encounter: Secondary | ICD-10-CM

## 2020-06-27 DIAGNOSIS — Z79899 Other long term (current) drug therapy: Secondary | ICD-10-CM | POA: Insufficient documentation

## 2020-06-27 DIAGNOSIS — S5002XA Contusion of left elbow, initial encounter: Secondary | ICD-10-CM | POA: Diagnosis not present

## 2020-06-27 DIAGNOSIS — S40012A Contusion of left shoulder, initial encounter: Secondary | ICD-10-CM | POA: Insufficient documentation

## 2020-06-27 DIAGNOSIS — C55 Malignant neoplasm of uterus, part unspecified: Secondary | ICD-10-CM | POA: Diagnosis not present

## 2020-06-27 DIAGNOSIS — W19XXXA Unspecified fall, initial encounter: Secondary | ICD-10-CM

## 2020-06-27 DIAGNOSIS — S0990XA Unspecified injury of head, initial encounter: Secondary | ICD-10-CM | POA: Diagnosis present

## 2020-06-27 DIAGNOSIS — I10 Essential (primary) hypertension: Secondary | ICD-10-CM | POA: Diagnosis not present

## 2020-06-27 MED ORDER — PREDNISONE 50 MG PO TABS: 50.0000 mg | ORAL_TABLET | Freq: Every day | ORAL | 0 refills | Status: DC

## 2020-06-27 NOTE — ED Triage Notes (Signed)
Pt ems from home s/p fall. Pt states that something hapened to her feet and she fell. Pt c/o left hip, head and shoulder pain.

## 2020-06-27 NOTE — ED Provider Notes (Signed)
[facility] Emergency Department Provider Note  ____________________________________________  Time seen: Approximately 6:32 PM  I have reviewed the triage vital signs and the nursing notes.   HISTORY  Chief Complaint Fall    HPI [name] is a 86 y.o. female who presents the emergency department complaining of headache, left shoulder, left elbow, left hip pain after a fall.  Patient states that she was walking, felt like her knee gave out from underneath her of her, she fell striking her head.  No loss of consciousness but patient did feel "dizzy" after hitting her head.  No subsequent loss of consciousness.  She denies any visual changes, neck pain.  No numbness or tingling in any extremity.  Patient's primary complaint is left shoulder and left hip pain.  EMS reports no shortening or rotation identified on their exam.  Patient was not able to bear weight on the left lower extremity.  No medications prior to arrival.  Patient has a significant chronic medical problem list but denies complaints with chronic medical issues at this time.         Past Medical History:  Diagnosis Date  . Anxiety   . Arthritis   . Cancer [facility])    uterine and questionable vulvar, s/p hysterectomy  . Chronic headaches   . Cystocele   . Dementia ([place])    able to sign own papers  . Depression   . GERD (gastroesophageal reflux disease)   . Glaucoma   . Hypercholesterolemia   . Hypertension   . Macular degeneration   . Reactive airway disease   . Rectocele   . Right bundle branch block   . Sleep apnea   . Uterine cancer ([place])   . Vertigo    none - several yrs  . Wears dentures    full upper and lower    Patient Active Problem List   Diagnosis Date Noted  . Osteoarthritis of glenohumeral joint (Severe) (Bilateral) 08/26/2018  . Osteoarthritis of AC (acromioclavicular) joint (Severe) (Bilateral) 08/26/2018  . Chronic acromioclavicular [facility]) joint pain (Bilateral)  08/26/2018  . Spondylolisthesis, cervical region (Multilevel) 08/13/2018  . Spondylolisthesis, cervicothoracic region (Multilevel) 08/13/2018  . Cervical foraminal stenosis (Severe C4-5 through C6-7) 08/13/2018  . Cervical radiculitis (Bilateral) 08/13/2018  . Cervical  Grade 1 Anterolisthesis of C4/5, C5/6, & C7/T1 08/13/2018  . Cervical facet arthropathy (Severe) (Right: C4-5, C5-6) 08/13/2018  . Cervical facet syndrome (Bilateral) (R>L) 08/13/2018  . Cervical radiculopathy due to osteoarthritis of spine 08/13/2018  . Hx of total knee replacement (Left) 07/27/2018  . DDD (degenerative disc disease), cervical 07/27/2018  . Chronic shoulder pain (Primary Area of Pain) (Bilateral) (R>L) 07/14/2018  . Chronic knee pain (Secondary Area of Pain) (Right) 07/14/2018  . Chronic hip pain [facility] Area of Pain) (Bilateral) (R>L) 07/14/2018  . Chronic pain syndrome 07/14/2018  . Pharmacologic therapy 07/14/2018  . Disorder of skeletal system 07/14/2018  . Problems influencing health status 07/14/2018  . Hyperlipidemia 03/02/2016  . Dementia ([place]) 03/02/2016  . Vertigo 03/01/2016  . Frequent falls 12/04/2014  . Hyperglycemia 12/04/2014  . Health care maintenance 12/04/2014  . Encounter for general adult medical examination without abnormal findings 12/04/2014  . URI (upper respiratory infection) 10/24/2014  . OAB (overactive bladder) 06/17/2014  . Pain of left thumb 05/31/2014  . Pain of finger of left hand 05/31/2014  . Rectocele 03/17/2014  . Vaginal atrophy 03/17/2014  . Cough 03/14/2014  . Bladder prolapse, female, acquired 02/15/2014  . Mixed stress and  urge urinary incontinence 02/15/2014  . Urethral prolapse 02/15/2014  . Dysuria 02/15/2014  . Elevated alkaline phosphatase level 01/30/2014  . Abnormal levels of other serum enzymes 01/30/2014  . Frequency of micturition 12/24/2013  . Cervicalgia 12/12/2013  . Skin lesion of breast 12/12/2013  . Disorder of breast 12/12/2013  .  Loss of vision 10/11/2013  . Vision loss, left eye 10/11/2013  . Chest pain 10/11/2013  . DOE (dyspnea on exertion) 10/11/2013  . Anxiety disorder 10/11/2013  . Other forms of dyspnea 10/11/2013  . 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 10/11/2013  . Visual loss 10/11/2013  . Cervicogenic headache 02/28/2013  . History of frequent urinary tract infections 02/01/2013  . Hypercholesterolemia 02/01/2013  . Pulmonary hypertension ([place]) 02/01/2013  . Major depressive disorder, single episode 02/01/2013  . Sleep disorder 02/01/2013  . Personal history of urinary infection 02/01/2013  . Pure hypercholesterolemia 02/01/2013  . Other secondary pulmonary hypertension ([place]) 02/01/2013  . GERD (gastroesophageal reflux disease) 12/14/2012  . Chronic headaches 12/14/2012  . Osteoarthritis of shoulders (Bilateral) 12/14/2012  . Depression with anxiety 12/14/2012  . Dizziness 09/10/2012  . Essential hypertension, benign 09/10/2012  . Essential (primary) hypertension 09/10/2012    Past Surgical History:  Procedure Laterality Date  . ABDOMINAL HYSTERECTOMY    . ABDOMINAL HYSTERECTOMY    . APPENDECTOMY    . CHOLECYSTECTOMY    . IMAGE GUIDED SINUS SURGERY N/A 06/20/2015   Procedure: IMAGE GUIDED SINUS SURGERY;  Surgeon: Clyde Canterbury, MD;  Location: [place];  Service: ENT;  Laterality: N/A;  GAVE DISK TO CE CE  . REPLACEMENT TOTAL KNEE Left   . SPHENOIDECTOMY Bilateral 06/20/2015   Procedure: SPHENOIDECTOMY;  Surgeon: Clyde Canterbury, MD;  Location: [place];  Service: ENT;  Laterality: Bilateral;  . uterine cancer    . VULVECTOMY      Prior to Admission medications   Medication Sig Start Date End Date Taking? Authorizing Provider  acetaminophen (TYLENOL) 500 MG tablet Take 1,000 mg by mouth 2 (two) times daily.    [provider]  brimonidine (ALPHAGAN P) 0.1 % SOLN Place 1 drop into both eyes 2 (two) times daily.    [provider]  Calcium Carbonate-Vitamin D3 (CALCIUM 600-D) 600-400 MG-UNIT TABS Take 1 tablet by mouth daily.    [provider]  diclofenac Sodium (VOLTAREN) 1 % GEL Apply 2 g topically every 4 (four) hours as needed (left shoulder pain).    [provider]  estradiol (ESTRACE) 0.1 MG/GM vaginal cream Place 1 Applicatorful vaginally every Monday, Wednesday, and Friday.    [provider]  gabapentin (NEURONTIN) 100 MG capsule Take 100 mg by mouth at bedtime.    [provider]  lidocaine (LIDODERM) 5 % Place 1 patch onto the skin daily as needed. Remove & Discard patch within 12 hours or as directed by MD    [provider]  lisinopril (PRINIVIL,ZESTRIL) 10 MG tablet Take 1 tablet (10 mg total) by mouth daily. 01/27/17   Einar Pheasant, MD  melatonin 5 MG TABS Take 10 mg by mouth at bedtime.    [provider]  miconazole (MICOTIN) 2 % cream Apply 1 application topically 2 (two) times daily as needed (fungal infection).    [provider]  Multiple Vitamins-Minerals (PRESERVISION AREDS) TABS Take 1 tablet by mouth daily.     [provider]  omeprazole (PRILOSEC) 20 MG capsule Take 1 capsule (20 mg total) by mouth  2 (two) times daily before a meal. 12/30/19   Earleen Newport, MD  oxybutynin (OXYTROL) 3.9 MG/24HR Place 1 patch onto the skin 2 (two) times a week. (apply on Monday and Friday)    [provider]  oxyCODONE (OXY IR/ROXICODONE) 5 MG immediate release tablet Take 5 mg by mouth 2 (two) times daily.    [provider]  predniSONE (DELTASONE) 50 MG tablet Take 1 tablet (50 mg total) by mouth daily with breakfast. 06/27/20   Tauren Delbuono, Charline Bills, PA-C  psyllium (REGULOID) 0.52 g capsule Take 2.6 g by mouth daily.    [provider]  sertraline (ZOLOFT) 100 MG tablet Take 150 mg by mouth daily.     Lenor Coffin, PA-C  sertraline (ZOLOFT) 50 MG tablet Take 150 mg by mouth daily.     [provider]  traMADol (ULTRAM) 50 MG tablet Take 1 tablet (50 mg total) by mouth 2 (two) times daily. Must last 30 days. 08/26/18 11/24/18  Milinda Pointer, MD  travoprost, benzalkonium, (TRAVATAN) 0.004 % ophthalmic solution Place 1 drop into both eyes at bedtime.    [provider]  trolamine salicylate (ASPERCREME/ALOE) 10 % cream Apply 1 application topically as needed for muscle pain.    [provider]    Allergies Ambien [zolpidem tartrate] and Codeine  Family History  Problem Relation Age of Onset  . Heart disease Mother   . Cancer Father        leukemia  . Heart disease Brother        x2  . Cancer Daughter        breast  . Kidney cancer Neg Hx   . Bladder Cancer Neg Hx     Social History Social History   Tobacco Use  . Smoking status: Never Smoker  . Smokeless tobacco: Never Used  Substance Use Topics  . Alcohol use: No    Alcohol/week: 0.0 standard drinks  . Drug use: No     Review of Systems  Constitutional: No fever/chills Eyes: No visual changes. No discharge ENT: No upper respiratory complaints. Cardiovascular: no chest pain. Respiratory: no cough. No SOB. Gastrointestinal: No abdominal pain.  No nausea, no vomiting.  No diarrhea.  No constipation. Genitourinary: Negative for dysuria. No hematuria Musculoskeletal: Positive for left shoulder, left elbow, left hip pain following a fall Skin: Negative for rash, abrasions, lacerations, ecchymosis. Neurological: Positive for headache after hitting her head on the floor during the fall.  No loss of consciousness.  Denies focal weakness or numbness. 10-point ROS otherwise negative.  ____________________________________________   PHYSICAL EXAM:  VITAL SIGNS: ED Triage Vitals  Enc Vitals Group     BP      Pulse      Resp      Temp      Temp src      SpO2      Weight      Height      Head Circumference      Peak Flow      Pain Score      Pain Loc      Pain Edu?      Excl.  in [place]?      Constitutional: Alert and oriented. Well appearing and in no acute distress. Eyes: Conjunctivae are normal. PERRL. EOMI. Head: No visible signs of trauma with abrasions, lacerations, ecchymosis, hematoma.  Patient is mildly tender to palpation along the left occipital/parietal skull.  No palpable abnormality or crepitus.  No battle  signs.  No raccoon eyes.  No serosanguineous fluid drainage from the ears or nares. ENT:      Ears:       Nose: No congestion/rhinnorhea.      Mouth/Throat: Mucous membranes are moist.  Neck: No stridor.  No cervical spine tenderness to palpation.  Cardiovascular: Normal rate, regular rhythm. Normal S1 and S2.  Good peripheral circulation. Respiratory: Normal respiratory effort without tachypnea or retractions. Lungs CTAB. Good air entry to the bases with no decreased or absent breath sounds. Gastrointestinal: Bowel sounds 4 quadrants. Soft and nontender to palpation. No guarding or rigidity. No palpable masses. No distention.  Musculoskeletal: Full range of motion to all extremities. No gross deformities appreciated.  Visualization of the left shoulder reveals no obvious deformity.  No abrasions, lacerations, ecchymosis.  Patient has good range of motion at this time of the shoulder.  Patient is diffusely tender to palpation along the lateral aspect of the shoulder through the humerus into the elbow.  No palpable abnormality or crepitus.  Good range of motion to the elbow joint.  No tenderness along the olecranon process or forearm.  Radial pulse intact.  Sensation intact all digits left upper extremity.  Examination of the left hip reveals tenderness over the greater trochanter region and proximal femur.  No posterior or anterior hip tenderness to palpation.  No palpable abnormalities.  Limited range of motion due to pain.  No tenderness extending through the mid and distal femur.  Examination of the knee and ankle is unremarkable.  Dorsalis pedis pulse and  sensation intact distally. Neurologic:  Normal speech and language. No gross focal neurologic deficits are appreciated.  Skin:  Skin is warm, dry and intact. No rash noted. Psychiatric: Mood and affect are normal. Speech and behavior are normal. Patient exhibits appropriate insight and judgement.   ____________________________________________   LABS (all labs ordered are listed, but only abnormal results are displayed)  Labs Reviewed - No data to display ____________________________________________  EKG   ____________________________________________  RADIOLOGY I personally viewed and evaluated these images as part of my medical decision making, as well as reviewing the written report by the radiologist.  DG Shoulder Right  Result Date: 06/27/2020 CLINICAL DATA:  Pain status post fall. EXAM: RIGHT SHOULDER - 2+ VIEW COMPARISON:  07/14/2018 FINDINGS: There is no acute displaced fracture or dislocation. There is severe right-sided glenohumeral osteoarthritis was a large osteophyte arising from the proximal humerus. There is osteopenia. IMPRESSION: 1. No acute displaced fracture or dislocation. 2. Severe right glenohumeral osteoarthritis. Electronically Signed   By: Constance Holster M.D.   On: 06/27/2020 19:49   DG ELBOW COMPLETE RIGHT (3+VIEW)  Result Date: 06/27/2020 CLINICAL DATA:  Pain status post fall EXAM: RIGHT ELBOW - COMPLETE 3+ VIEW COMPARISON:  None. FINDINGS: There is moderate elbow osteoarthritis. There is no joint effusion. No acute displaced fracture or dislocation. IMPRESSION: 1. No acute displaced fracture or dislocation. 2. Moderate elbow osteoarthritis. Electronically Signed   By: Constance Holster M.D.   On: 06/27/2020 20:07   CT Head Wo Contrast  Result Date: 06/27/2020 CLINICAL DATA:  Tripped and fell, hit head, headache EXAM: CT HEAD WITHOUT CONTRAST TECHNIQUE: Contiguous axial images were obtained from the base of the skull through the vertex without intravenous  contrast. COMPARISON:  07/02/2018 FINDINGS: Brain: No acute infarct or hemorrhage. Lateral ventricles and midline structures are unremarkable. No acute extra-axial fluid collections. No mass effect. Vascular: No hyperdense vessel or unexpected calcification. Skull: Normal. Negative for fracture or focal  lesion. Sinuses/Orbits: No acute finding. Other: None. IMPRESSION: 1. No acute intracranial process. Electronically Signed   By: Randa Ngo M.D.   On: 06/27/2020 19:22   CT Cervical Spine Wo Contrast  Result Date: 06/27/2020 CLINICAL DATA:  Tripped and fell, hit head EXAM: CT CERVICAL SPINE WITHOUT CONTRAST TECHNIQUE: Multidetector CT imaging of the cervical spine was performed without intravenous contrast. Multiplanar CT image reconstructions were also generated. COMPARISON:  05/10/2018 FINDINGS: Alignment: Alignment is anatomic. Skull base and vertebrae: No acute displaced fracture. Soft tissues and spinal canal: No prevertebral fluid or swelling. No visible canal hematoma. Disc levels: Stable bony fusion across the C2-3 and C3-4 facets. Extensive multilevel facet hypertrophy unchanged. Prominent spondylosis at C5-6, C6-7, and T1-T2 unchanged. Right predominant neural foraminal narrowing at C5-6 with symmetrical neural foraminal encroachment at C6-7, stable. Upper chest: Airway is patent.  Lung apices are clear. Other: Reconstructed images demonstrate no additional findings. IMPRESSION: 1. Stable multilevel spondylosis and facet hypertrophy. No acute fracture. Electronically Signed   By: Randa Ngo M.D.   On: 06/27/2020 19:25   DG HIP UNILAT WITH PELVIS 2-3 VIEWS RIGHT  Result Date: 06/27/2020 CLINICAL DATA:  Pain EXAM: DG HIP (WITH OR WITHOUT PELVIS) 2-3V RIGHT COMPARISON:  07/14/2018 FINDINGS: There are old healed fractures of the left inferior and superior pubic rami. There is osteopenia which limits detection of nondisplaced fractures. There is bilateral hip osteoarthritis. There are phleboliths  projecting over the patient's pelvis. IMPRESSION: 1. No acute displaced fracture or dislocation. 2. Old healed fractures as detailed above. Electronically Signed   By: Constance Holster M.D.   On: 06/27/2020 19:48    ____________________________________________    PROCEDURES  Procedure(s) performed:    Procedures    Medications - No data to display   ____________________________________________   INITIAL IMPRESSION / ASSESSMENT AND PLAN / ED COURSE  Pertinent labs & imaging results that were available during my care of the patient were reviewed by me and considered in my medical decision making (see chart for details).  Review of the [place] CSRS was performed in accordance of the [place] prior to dispensing any controlled drugs.           Patient's diagnosis is consistent with fall, multiple contusions. Patient presented to emergency department after her knee gave out from underneath her causing her to sustain a fall. Patient did hit her head but did not lose consciousness. Patient does have dementia but was able to answer most questions appropriately. Remainder of details was filled in by the patient's daughter. Overall exam was reassuring. Imaging revealed no acute traumatic findings. This time patient will have a short course of prednisone for inflammation control for multiple contusions. Follow-up with orthopedics as the daughter reports that patient has difficulties with her right knee on an ongoing basis. Patient had this replaced 15 years ago. Patient will have swelling by the end of the day, this resolves overnight time and then with any movement swelling returns. This is what caused patient's fall.. Patient will be discharged at this time in stable condition. Follow-up with primary care orthopedics as needed.. Patient is given ED precautions to return to the ED for any worsening or new symptoms.     ____________________________________________  FINAL CLINICAL IMPRESSION(S)  / ED DIAGNOSES  Final diagnoses:  Fall, initial encounter  Multiple contusions      NEW MEDICATIONS STARTED DURING THIS VISIT:  ED Discharge Orders         Ordered    predniSONE (DELTASONE) 50  MG tablet  Daily with breakfast        06/27/20 2049              This chart was dictated using voice recognition software/Dragon. Despite best efforts to proofread, errors can occur which can change the meaning. Any change was purely unintentional.    Darletta Moll, PA-C 06/27/20 2053    Delman Kitten, MD 06/27/20 2330

## 2020-06-27 NOTE — ED Notes (Signed)
Called [facility] for transport to [address]. [place], [place]  [address]

## 2020-07-20 ENCOUNTER — Emergency Department: Payer: Medicare (Managed Care)

## 2020-07-20 ENCOUNTER — Other Ambulatory Visit: Payer: Self-pay

## 2020-07-20 ENCOUNTER — Emergency Department
Admission: EM | Admit: 2020-07-20 | Discharge: 2020-07-20 | Disposition: A | Discharge status: Home / Self Care | Payer: Medicare (Managed Care) | Attending: Emergency Medicine | Admitting: Emergency Medicine

## 2020-07-20 ENCOUNTER — Encounter: Payer: Self-pay | Admitting: Emergency Medicine

## 2020-07-20 DIAGNOSIS — Z79899 Other long term (current) drug therapy: Secondary | ICD-10-CM | POA: Insufficient documentation

## 2020-07-20 DIAGNOSIS — Y92009 Unspecified place in unspecified non-institutional (private) residence as the place of occurrence of the external cause: Secondary | ICD-10-CM | POA: Insufficient documentation

## 2020-07-20 DIAGNOSIS — M25512 Pain in left shoulder: Secondary | ICD-10-CM | POA: Diagnosis not present

## 2020-07-20 DIAGNOSIS — Z96652 Presence of left artificial knee joint: Secondary | ICD-10-CM | POA: Insufficient documentation

## 2020-07-20 DIAGNOSIS — I618 Other nontraumatic intracerebral hemorrhage: Secondary | ICD-10-CM | POA: Insufficient documentation

## 2020-07-20 DIAGNOSIS — S0003XA Contusion of scalp, initial encounter: Secondary | ICD-10-CM | POA: Insufficient documentation

## 2020-07-20 DIAGNOSIS — W19XXXA Unspecified fall, initial encounter: Secondary | ICD-10-CM | POA: Insufficient documentation

## 2020-07-20 DIAGNOSIS — C55 Malignant neoplasm of uterus, part unspecified: Secondary | ICD-10-CM | POA: Insufficient documentation

## 2020-07-20 DIAGNOSIS — Z90710 Acquired absence of both cervix and uterus: Secondary | ICD-10-CM | POA: Insufficient documentation

## 2020-07-20 DIAGNOSIS — S0990XA Unspecified injury of head, initial encounter: Secondary | ICD-10-CM | POA: Diagnosis not present

## 2020-07-20 DIAGNOSIS — F0390 Unspecified dementia without behavioral disturbance: Secondary | ICD-10-CM | POA: Insufficient documentation

## 2020-07-20 DIAGNOSIS — I10 Essential (primary) hypertension: Secondary | ICD-10-CM | POA: Insufficient documentation

## 2020-07-20 DIAGNOSIS — I619 Nontraumatic intracerebral hemorrhage, unspecified: Secondary | ICD-10-CM

## 2020-07-20 LAB — CBC WITH DIFFERENTIAL/PLATELET
Abs Immature Granulocytes: 0.02 10*3/uL (ref 0.00–0.07)
Basophils Absolute: 0.1 10*3/uL (ref 0.0–0.1)
Basophils Relative: 1 %
Eosinophils Absolute: 0.2 10*3/uL (ref 0.0–0.5)
Eosinophils Relative: 2 %
HCT: 39.9 % (ref 36.0–46.0)
Hemoglobin: 13 g/dL (ref 12.0–15.0)
Immature Granulocytes: 0 %
Lymphocytes Relative: 20 %
Lymphs Abs: 1.4 10*3/uL (ref 0.7–4.0)
MCH: 32 pg (ref 26.0–34.0)
MCHC: 32.6 g/dL (ref 30.0–36.0)
MCV: 98.3 fL (ref 80.0–100.0)
Monocytes Absolute: 0.5 10*3/uL (ref 0.1–1.0)
Monocytes Relative: 7 %
Neutro Abs: 4.9 10*3/uL (ref 1.7–7.7)
Neutrophils Relative %: 70 %
Platelets: 250 10*3/uL (ref 150–400)
RBC: 4.06 MIL/uL (ref 3.87–5.11)
RDW: 12.5 % (ref 11.5–15.5)
WBC: 7 10*3/uL (ref 4.0–10.5)
nRBC: 0 % (ref 0.0–0.2)

## 2020-07-20 LAB — COMPREHENSIVE METABOLIC PANEL
ALT: 11 U/L (ref 0–44)
AST: 16 U/L (ref 15–41)
Albumin: 4.1 g/dL (ref 3.5–5.0)
Alkaline Phosphatase: 89 U/L (ref 38–126)
Anion gap: 11 (ref 5–15)
BUN: 26 mg/dL — ABNORMAL HIGH (ref 8–23)
CO2: 25 mmol/L (ref 22–32)
Calcium: 9.6 mg/dL (ref 8.9–10.3)
Chloride: 105 mmol/L (ref 98–111)
Creatinine, Ser: 1.07 mg/dL — ABNORMAL HIGH (ref 0.44–1.00)
GFR, Estimated: 45 mL/min — ABNORMAL LOW (ref >60)
Glucose, Bld: 105 mg/dL — ABNORMAL HIGH (ref 70–99)
Potassium: 5 mmol/L (ref 3.5–5.1)
Sodium: 141 mmol/L (ref 135–145)
Total Bilirubin: 0.9 mg/dL (ref 0.3–1.2)
Total Protein: 7.2 g/dL (ref 6.5–8.1)

## 2020-07-20 LAB — PROTIME-INR
INR: 1 (ref 0.8–1.2)
Prothrombin Time: 12.8 seconds (ref 11.4–15.2)

## 2020-07-20 MED ORDER — SODIUM CHLORIDE 0.9 % IV BOLUS
250.0000 mL | Freq: Once | INTRAVENOUS | Status: AC
  Administered 2020-07-20: 250 mL via INTRAVENOUS

## 2020-07-20 MED ORDER — OXYCODONE HCL 5 MG PO TABS
5.0000 mg | ORAL_TABLET | Freq: Once | ORAL | Status: AC
  Administered 2020-07-20: 5 mg via ORAL
  Filled 2020-07-20: qty 1

## 2020-07-20 NOTE — ED Provider Notes (Signed)
  Received call from pt's PCP provider at [facility]. Noted h/o blindness, dementia, long time frequent falls, and chronic shoulder pain.  They advise that if pt is stable for discharge home, they can arrange transport home and close follow up with their medical staff and rehab.    We can reach them [phone]     Carrie Mew, MD 07/20/20 [phone]

## 2020-07-20 NOTE — ED Notes (Signed)
Pad not available. E signature taken on paper

## 2020-07-20 NOTE — ED Provider Notes (Signed)
[facility] Emergency Department Provider Note   ____________________________________________   First MD Initiated Contact with Patient 07/20/20 1234     (approximate)  I have reviewed the triage vital signs and the nursing notes.   HISTORY  Chief Complaint Fall, Shoulder Pain, and Head Injury    HPI [name] is a 86 y.o. female with a past medical history of dementia, blindness, and frequent falls who presents after a mechanical fall at home this morning as husband was trying to assist her to the bathroom.  Per family at bedside, patient has been complaining of posterior headache as well as left shoulder pain since this incident.  Patient has tenderness palpation over her left clavicle per daughter at bedside.  Denies patient having any loss of consciousness, nausea/vomiting, or any change in movement of any extremity         Past Medical History:  Diagnosis Date  . Anxiety   . Arthritis   . Cancer [facility])    uterine and questionable vulvar, s/p hysterectomy  . Chronic headaches   . Cystocele   . Dementia ([place])    able to sign own papers  . Depression   . GERD (gastroesophageal reflux disease)   . Glaucoma   . Hypercholesterolemia   . Hypertension   . Macular degeneration   . Reactive airway disease   . Rectocele   . Right bundle branch block   . Sleep apnea   . Uterine cancer ([place])   . Vertigo    none - several yrs  . Wears dentures    full upper and lower    Patient Active Problem List   Diagnosis Date Noted  . Osteoarthritis of glenohumeral joint (Severe) (Bilateral) 08/26/2018  . Osteoarthritis of AC (acromioclavicular) joint (Severe) (Bilateral) 08/26/2018  . Chronic acromioclavicular [facility]) joint pain (Bilateral) 08/26/2018  . Spondylolisthesis, cervical region (Multilevel) 08/13/2018  . Spondylolisthesis, cervicothoracic region (Multilevel) 08/13/2018  . Cervical foraminal stenosis (Severe C4-5 through C6-7) 08/13/2018    . Cervical radiculitis (Bilateral) 08/13/2018  . Cervical  Grade 1 Anterolisthesis of C4/5, C5/6, & C7/T1 08/13/2018  . Cervical facet arthropathy (Severe) (Right: C4-5, C5-6) 08/13/2018  . Cervical facet syndrome (Bilateral) (R>L) 08/13/2018  . Cervical radiculopathy due to osteoarthritis of spine 08/13/2018  . Hx of total knee replacement (Left) 07/27/2018  . DDD (degenerative disc disease), cervical 07/27/2018  . Chronic shoulder pain (Primary Area of Pain) (Bilateral) (R>L) 07/14/2018  . Chronic knee pain (Secondary Area of Pain) (Right) 07/14/2018  . Chronic hip pain [facility] Area of Pain) (Bilateral) (R>L) 07/14/2018  . Chronic pain syndrome 07/14/2018  . Pharmacologic therapy 07/14/2018  . Disorder of skeletal system 07/14/2018  . Problems influencing health status 07/14/2018  . Hyperlipidemia 03/02/2016  . Dementia ([place]) 03/02/2016  . Vertigo 03/01/2016  . Frequent falls 12/04/2014  . Hyperglycemia 12/04/2014  . Health care maintenance 12/04/2014  . Encounter for general adult medical examination without abnormal findings 12/04/2014  . URI (upper respiratory infection) 10/24/2014  . OAB (overactive bladder) 06/17/2014  . Pain of left thumb 05/31/2014  . Pain of finger of left hand 05/31/2014  . Rectocele 03/17/2014  . Vaginal atrophy 03/17/2014  . Cough 03/14/2014  . Bladder prolapse, female, acquired 02/15/2014  . Mixed stress and urge urinary incontinence 02/15/2014  . Urethral prolapse 02/15/2014  . Dysuria 02/15/2014  . Elevated alkaline phosphatase level 01/30/2014  . Abnormal levels of other serum enzymes 01/30/2014  . Frequency of micturition 12/24/2013  . Cervicalgia  12/12/2013  . Skin lesion of breast 12/12/2013  . Disorder of breast 12/12/2013  . Loss of vision 10/11/2013  . Vision loss, left eye 10/11/2013  . Chest pain 10/11/2013  . DOE (dyspnea on exertion) 10/11/2013  . Anxiety disorder 10/11/2013  . Other forms of dyspnea 10/11/2013  . Unqualified  visual loss of left eye with normal vision of contralateral eye 10/11/2013  . Visual loss 10/11/2013  . Cervicogenic headache 02/28/2013  . History of frequent urinary tract infections 02/01/2013  . Hypercholesterolemia 02/01/2013  . Pulmonary hypertension ([place]) 02/01/2013  . Major depressive disorder, single episode 02/01/2013  . Sleep disorder 02/01/2013  . Personal history of urinary infection 02/01/2013  . Pure hypercholesterolemia 02/01/2013  . Other secondary pulmonary hypertension ([place]) 02/01/2013  . GERD (gastroesophageal reflux disease) 12/14/2012  . Chronic headaches 12/14/2012  . Osteoarthritis of shoulders (Bilateral) 12/14/2012  . Depression with anxiety 12/14/2012  . Dizziness 09/10/2012  . Essential hypertension, benign 09/10/2012  . Essential (primary) hypertension 09/10/2012    Past Surgical History:  Procedure Laterality Date  . ABDOMINAL HYSTERECTOMY    . ABDOMINAL HYSTERECTOMY    . APPENDECTOMY    . CHOLECYSTECTOMY    . IMAGE GUIDED SINUS SURGERY N/A 06/20/2015   Procedure: IMAGE GUIDED SINUS SURGERY;  Surgeon: Clyde Canterbury, MD;  Location: [place];  Service: ENT;  Laterality: N/A;  GAVE DISK TO CE CE  . REPLACEMENT TOTAL KNEE Left   . SPHENOIDECTOMY Bilateral 06/20/2015   Procedure: SPHENOIDECTOMY;  Surgeon: Clyde Canterbury, MD;  Location: [place];  Service: ENT;  Laterality: Bilateral;  . uterine cancer    . VULVECTOMY      Prior to Admission medications   Medication Sig Start Date End Date Taking? Authorizing Provider  acetaminophen (TYLENOL) 500 MG tablet Take 1,000 mg by mouth 2 (two) times daily.    [provider]  brimonidine (ALPHAGAN P) 0.1 % SOLN Place 1 drop into both eyes 2 (two) times daily.    [provider]  Calcium Carbonate-Vitamin D3 (CALCIUM 600-D) 600-400 MG-UNIT TABS Take 1 tablet by mouth daily.    [provider]  diclofenac Sodium (VOLTAREN) 1 % GEL Apply 2 g topically every 4 (four)  hours as needed (left shoulder pain).    [provider]  estradiol (ESTRACE) 0.1 MG/GM vaginal cream Place 1 Applicatorful vaginally every Monday, Wednesday, and Friday.    [provider]  gabapentin (NEURONTIN) 100 MG capsule Take 100 mg by mouth at bedtime.    [provider]  lidocaine (LIDODERM) 5 % Place 1 patch onto the skin daily as needed. Remove & Discard patch within 12 hours or as directed by MD    [provider]  lisinopril (PRINIVIL,ZESTRIL) 10 MG tablet Take 1 tablet (10 mg total) by mouth daily. 01/27/17   Einar Pheasant, MD  melatonin 5 MG TABS Take 10 mg by mouth at bedtime.    [provider]  miconazole (MICOTIN) 2 % cream Apply 1 application topically 2 (two) times daily as needed (fungal infection).    [provider]  Multiple Vitamins-Minerals (PRESERVISION AREDS) TABS Take 1 tablet by mouth daily.     [provider]  omeprazole (PRILOSEC) 20 MG capsule Take 1 capsule (20 mg total) by mouth 2 (two) times daily before a meal. 12/30/19   Earleen Newport, MD  oxybutynin (OXYTROL) 3.9 MG/24HR Place 1 patch onto the skin 2 (two) times a week. (apply on Monday and Friday)  [provider]  oxyCODONE (OXY IR/ROXICODONE) 5 MG immediate release tablet Take 5 mg by mouth 2 (two) times daily.    [provider]  predniSONE (DELTASONE) 50 MG tablet Take 1 tablet (50 mg total) by mouth daily with breakfast. 06/27/20   Cuthriell, Charline Bills, PA-C  psyllium (REGULOID) 0.52 g capsule Take 2.6 g by mouth daily.    [provider]  sertraline (ZOLOFT) 100 MG tablet Take 150 mg by mouth daily.     Lenor Coffin, PA-C  sertraline (ZOLOFT) 50 MG tablet Take 150 mg by mouth daily.     [provider]  traMADol (ULTRAM) 50 MG tablet Take 1 tablet (50 mg total) by mouth 2 (two) times daily. Must last 30 days. 08/26/18 11/24/18  Milinda Pointer, MD  travoprost, benzalkonium, (TRAVATAN) 0.004 %  ophthalmic solution Place 1 drop into both eyes at bedtime.    [provider]  trolamine salicylate (ASPERCREME/ALOE) 10 % cream Apply 1 application topically as needed for muscle pain.    [provider]    Allergies Ambien [zolpidem tartrate] and Codeine  Family History  Problem Relation Age of Onset  . Heart disease Mother   . Cancer Father        leukemia  . Heart disease Brother        x2  . Cancer Daughter        breast  . Kidney cancer Neg Hx   . Bladder Cancer Neg Hx     Social History Social History   Tobacco Use  . Smoking status: Never Smoker  . Smokeless tobacco: Never Used  Substance Use Topics  . Alcohol use: No    Alcohol/week: 0.0 standard drinks  . Drug use: No    Review of Systems Unable to assess secondary to mental status   ____________________________________________   PHYSICAL EXAM:  VITAL SIGNS: ED Triage Vitals  Enc Vitals Group     BP 07/20/20 0834 (!) 160/88     Pulse Rate 07/20/20 0834 76     Resp 07/20/20 0834 16     Temp 07/20/20 0834 98.3 F (36.8 C)     Temp Source 07/20/20 0834 Oral     SpO2 07/20/20 0834 92 %     Weight 07/20/20 0835 172 lb (78 kg)     Height --      Head Circumference --      Peak Flow --      Pain Score --      Pain Loc --      Pain Edu? --      Excl. in [place]? --     Constitutional: Alert and oriented. Well appearing and in no acute distress. Eyes: Conjunctivae are normal. PERRL. Head: Hematoma to left occiput Nose: No congestion/rhinnorhea. Mouth/Throat: Mucous membranes are moist. Neck: No stridor Cardiovascular: Grossly normal heart sounds.  Good peripheral circulation. Respiratory: Normal respiratory effort.  No retractions. Gastrointestinal: Soft and nontender. No distention. Musculoskeletal: No obvious deformities.  Tenderness palpation over left clavicle at the [facility] joint Neurologic:  Normal speech and language. No gross focal neurologic deficits are appreciated. Skin:   Skin is warm and dry. No rash noted. Psychiatric: Mood and affect are normal. Speech and behavior are normal.  ____________________________________________   LABS (all labs ordered are listed, but only abnormal results are displayed)  Labs Reviewed  COMPREHENSIVE METABOLIC PANEL - Abnormal; Notable for the following components:      Result Value   Glucose, Bld 105 (*)  BUN 26 (*)    Creatinine, Ser 1.07 (*)    GFR, Estimated 45 (*)    All other components within normal limits  CBC WITH DIFFERENTIAL/PLATELET  PROTIME-INR   _ RADIOLOGY  ED MD interpretation: 2 view x-ray of the left clavicle shows no evidence of acute abnormalities including fractures or dislocations  CT noncontrast of the head shows a new 1.9 cm intraparenchymal hemorrhage of the left occipital lobe  CT without contrast of the cervical spine shows no evidence of acute fractures or dislocations of the cervical spine  Three-view x-ray of the shoulder shows no evidence of acute fractures or dislocations  Official radiology report(s): DG Clavicle Left  Result Date: 07/20/2020 CLINICAL DATA:  Fall with pain and deformity. EXAM: LEFT CLAVICLE - 2+ VIEWS COMPARISON:  Same day left shoulder radiographs. FINDINGS: Physiologic alignment with approximation of the joints. No fracture or focal osseous lesion. Moderate acromioclavicular and severe glenohumeral osteoarthrosis. Soft tissues are unremarkable. IMPRESSION: No acute osseous abnormality. Acromioclavicular and glenohumeral osteoarthrosis. Electronically Signed   By: Primitivo Gauze M.D.   On: 07/20/2020 13:39   CT Head Wo Contrast  Result Date: 07/20/2020 CLINICAL DATA:  Fall. EXAM: CT HEAD WITHOUT CONTRAST TECHNIQUE: Contiguous axial images were obtained from the base of the skull through the vertex without intravenous contrast. COMPARISON:  CT head dated June 27, 2020. FINDINGS: Brain: New 1.9 cm intraparenchymal hemorrhage in the left occipital lobe  surrounding edema. No evidence of acute infarction, hydrocephalus, extra-axial collection or mass lesion/mass effect. Stable mild atrophy. Vascular: Atherosclerotic vascular calcification of the carotid siphons. No hyperdense vessel. Skull: Normal. Negative for fracture or focal lesion. Sinuses/Orbits: No acute finding. Other: None. IMPRESSION: 1. New 1.9 cm intraparenchymal hemorrhage in the left occipital lobe. Critical Value/emergent results were called by telephone at the time of interpretation on 07/20/2020 at 1:15 pm to provider [facility] , who verbally acknowledged these results. Electronically Signed   By: Titus Dubin M.D.   On: 07/20/2020 13:17   CT Cervical Spine Wo Contrast  Result Date: 07/20/2020 CLINICAL DATA:  Neck trauma.  Additional history provided: EXAM: CT CERVICAL SPINE WITHOUT CONTRAST TECHNIQUE: Multidetector CT imaging of the cervical spine was performed without intravenous contrast. Multiplanar CT image reconstructions were also generated. COMPARISON:  CT of the cervical spine 06/27/2020. FINDINGS: Alignment: Straightening of the expected cervical lordosis. 2 mm C4-C5 grade 1 anterolisthesis. Trace C7-T1 grade 1 anterolisthesis. Skull base and vertebrae: The basion-dental and atlanto-dental intervals are maintained.No evidence of acute fracture to the cervical spine. Soft tissues and spinal canal: No prevertebral fluid or swelling. No visible canal hematoma. Disc levels: Cervical spondylosis with multilevel disc space narrowing, disc bulges, posterior disc osteo, uncovertebral hypertrophy and facet arthrosis. There is fusion across the facet joints at C2-C3 and C3-C4. Disc space narrowing is advanced at C3-C4, C5-C6, C6-C7, C7-T1 and T1-T2. Early osseous fusion across the C3-C4 disc space is also questioned. No high-grade bony spinal canal stenosis. Multilevel bony neural foraminal narrowing Upper chest: No consolidation within the imaged lung apices. No visible pneumothorax.  Other: 11 mm left thyroid lobe nodule with small internal focus of calcification not meeting consensus criteria for ultrasound follow-up. IMPRESSION: No evidence of acute fracture to the cervical spine. Mild C4-C5 and C7-T1 grade 1 anterolisthesis Spondylosis of the cervical and visualized upper thoracic spine with sites of bony ankylosis as described. Electronically Signed   By: Kellie Simmering DO   On: 07/20/2020 13:20   DG Shoulder Left  Result Date: 07/20/2020  CLINICAL DATA:  LEFT shoulder pain, fall EXAM: LEFT SHOULDER - 2+ VIEW COMPARISON:  07/14/2018 FINDINGS: Severe LEFT glenohumeral degenerative changes. Acromioclavicular degenerative changes also noted. No sign of fracture or dislocation. IMPRESSION: Severe LEFT glenohumeral degenerative changes. No acute fracture or dislocation. Electronically Signed   By: Zetta Bills M.D.   On: 07/20/2020 09:23    ____________________________________________   PROCEDURES  Procedure(s) performed (including Critical Care):  .Critical Care Performed by: Naaman Plummer, MD Authorized by: Naaman Plummer, MD   Critical care provider statement:    Critical care time (minutes):  41   Critical care was necessary to treat or prevent imminent or life-threatening deterioration of the following conditions:  CNS failure or compromise   Critical care was time spent personally by me on the following activities:  Discussions with consultants, evaluation of patient's response to treatment, examination of patient, ordering and performing treatments and interventions, ordering and review of laboratory studies, ordering and review of radiographic studies, pulse oximetry, re-evaluation of patient's condition, obtaining history from patient or surrogate and review of old charts   I assumed direction of critical care for this patient from another provider in my specialty: no   .1-3 Lead EKG Interpretation Performed by: Naaman Plummer, MD Authorized by: Naaman Plummer, MD     Interpretation: normal     ECG rate:  78   ECG rate assessment: normal     Rhythm: sinus rhythm     Ectopy: none     Conduction: normal       ____________________________________________   INITIAL IMPRESSION / ASSESSMENT AND PLAN / ED COURSE  As part of my medical decision making, I reviewed the following data within the [place] notes reviewed and incorporated, Labs reviewed, EKG interpreted, Old chart reviewed, Radiograph reviewed and Notes from prior ED visits reviewed and incorporated        Patient is a 86 year old female who presents after a mechanical fall in the bathroom resulting in left posterior head trauma.  Differential diagnosis includes intracranial hemorrhage, subarachnoid hemorrhage, arrhythmia, skull fracture, AC separation, humerus fracture Given history, physical exam, radiologic/laboratory evaluation, patient does show evidence of new intraparenchymal hemorrhage in the left occipital lobe.  I spoke to Dr. Izora Ribas in neurosurgery who recommends repeat head CT at 6 hours and if no change likely discharge.  Care of this patient will be signed out to the oncoming physician at the end of my shift.  All pertinent patient information conveyed and all questions answered.  All further care and disposition decisions will be made by the oncoming physician.      ____________________________________________   FINAL CLINICAL IMPRESSION(S) / ED DIAGNOSES  Final diagnoses:  Intraparenchymal hemorrhage of brain ([place])  Injury of head, initial encounter  Fall, initial encounter  Contusion of occipital region of scalp, initial encounter     ED Discharge Orders    None       Note:  This document was prepared using Dragon voice recognition software and may include unintentional dictation errors.   Naaman Plummer, MD 07/20/20 [phone]

## 2020-07-20 NOTE — ED Notes (Signed)
Pt extremely agitated prior to daughter's arrival. Updating home meds. Daughter reports pt did not receive morning medications.

## 2020-07-20 NOTE — ED Notes (Signed)
Pharmacy tech updating med list from fax sent by PCP.

## 2020-07-20 NOTE — Consult Note (Signed)
Referring Physician:  No referring provider defined for this encounter.  Primary Physician:  [facility] [place]  Chief Complaint:  IPH after a fall  History of Present Illness: [name] is a 86 y.o. female with PMH of dementia, legal blindness, frequent falls who presents to the [facility] ED after suffering a fall earlier today. She reports that she was attempting to transfer to the BR and fell, hitting her head on the toilet. Head CT showed 1.9 cm intraparenchymal hemorrhage in the left occipital lobe.  Her daughter is at bedside. At baseline, [name] suffers from dementia and on interview is disoriented to place and time. Able to name her daughter. Speech is clear however she does stutter in getting certain words out. Per her daughter, this is normal for her. She denies any headache, nausea. She is not on any AC, does not drink ETOH.    Review of Systems:  A 10 point review of systems is negative, except for the pertinent positives and negatives detailed in the HPI.  Past Medical History: Past Medical History:  Diagnosis Date  . Anxiety   . Arthritis   . Cancer [facility])    uterine and questionable vulvar, s/p hysterectomy  . Chronic headaches   . Cystocele   . Dementia ([place])    able to sign own papers  . Depression   . GERD (gastroesophageal reflux disease)   . Glaucoma   . Hypercholesterolemia   . Hypertension   . Macular degeneration   . Reactive airway disease   . Rectocele   . Right bundle branch block   . Sleep apnea   . Uterine cancer ([place])   . Vertigo    none - several yrs  . Wears dentures    full upper and lower    Past Surgical History: Past Surgical History:  Procedure Laterality Date  . ABDOMINAL HYSTERECTOMY    . ABDOMINAL HYSTERECTOMY    . APPENDECTOMY    . CHOLECYSTECTOMY    . IMAGE GUIDED SINUS SURGERY N/A 06/20/2015   Procedure: IMAGE GUIDED SINUS SURGERY;  Surgeon: Clyde Canterbury, MD;  Location: [place];   Service: ENT;  Laterality: N/A;  GAVE DISK TO CE CE  . REPLACEMENT TOTAL KNEE Left   . SPHENOIDECTOMY Bilateral 06/20/2015   Procedure: SPHENOIDECTOMY;  Surgeon: Clyde Canterbury, MD;  Location: [place];  Service: ENT;  Laterality: Bilateral;  . uterine cancer    . VULVECTOMY      Allergies: Allergies as of 07/20/2020 - Review Complete 07/20/2020  Allergen Reaction Noted  . Ambien [zolpidem tartrate] Other (See Comments) 09/10/2012  . Codeine Itching 08/11/2013    Medications: No current facility-administered medications for this encounter.  Current Outpatient Medications:  .  acetaminophen (TYLENOL) 500 MG tablet, Take 1,000 mg by mouth 2 (two) times daily., Disp: , Rfl:  .  brimonidine (ALPHAGAN P) 0.1 % SOLN, Place 1 drop into both eyes 2 (two) times daily., Disp: , Rfl:  .  Calcium Carbonate-Vitamin D3 (CALCIUM 600-D) 600-400 MG-UNIT TABS, Take 1 tablet by mouth daily., Disp: , Rfl:  .  cholecalciferol (VITAMIN D) 25 MCG (1000 UNIT) tablet, Take 1,000 Units by mouth daily., Disp: , Rfl:  .  CYMBALTA 30 MG capsule, Take 30 mg by mouth daily., Disp: , Rfl:  .  diclofenac Sodium (VOLTAREN) 1 % GEL, Apply 2 g topically every 4 (four) hours as needed (left shoulder pain)., Disp: , Rfl:  .  estradiol (ESTRACE) 0.1  MG/GM vaginal cream, Place 1 Applicatorful vaginally every Monday, Wednesday, and Friday., Disp: , Rfl:  .  gabapentin (NEURONTIN) 100 MG capsule, Take 100 mg by mouth at bedtime., Disp: , Rfl:  .  melatonin 5 MG TABS, Take 10 mg by mouth at bedtime., Disp: , Rfl:  .  memantine (NAMENDA) 10 MG tablet, Take 10 mg by mouth 2 (two) times daily., Disp: , Rfl:  .  mirtazapine (REMERON SOL-TAB) 15 MG disintegrating tablet, Take 15 mg by mouth at bedtime., Disp: , Rfl:  .  Multiple Vitamins-Minerals (PRESERVISION AREDS) TABS, Take 1 tablet by mouth daily. , Disp: , Rfl:  .  omeprazole (PRILOSEC) 20 MG capsule, Take 1 capsule (20 mg total) by mouth 2 (two) times daily before a  meal., Disp: 90 capsule, Rfl: 1 .  oxybutynin (OXYTROL) 3.9 MG/24HR, Place 1 patch onto the skin 2 (two) times a week. (apply on Monday and Friday), Disp: , Rfl:  .  oxyCODONE (OXY IR/ROXICODONE) 5 MG immediate release tablet, Take 5 mg by mouth 2 (two) times daily., Disp: , Rfl:  .  sertraline (ZOLOFT) 50 MG tablet, Take 150 mg by mouth daily. , Disp: , Rfl:  .  travoprost, benzalkonium, (TRAVATAN) 0.004 % ophthalmic solution, Place 1 drop into both eyes at bedtime., Disp: , Rfl:  .  vitamin B-12 (CYANOCOBALAMIN) 1000 MCG tablet, Take 1,000 mcg by mouth daily., Disp: , Rfl:  .  albuterol (VENTOLIN HFA) 108 (90 Base) MCG/ACT inhaler, Inhale 2 puffs into the lungs every 3 (three) hours as needed for wheezing, cough or shortness of breath., Disp: , Rfl:  .  lidocaine (LIDODERM) 5 %, Place 1 patch onto the skin daily as needed. Remove & Discard patch within 12 hours or as directed by MD, Disp: , Rfl:  .  PROLIA 60 MG/ML SOSY injection, Inject 60 mg into the skin every 6 (six) months., Disp: , Rfl:  .  SM CALCIUM 600/VITAMIN D 600-400 MG-UNIT tablet, Take 1 tablet by mouth daily., Disp: , Rfl:  .  traMADol (ULTRAM) 50 MG tablet, Take 1 tablet (50 mg total) by mouth 2 (two) times daily. Must last 30 days., Disp: 60 tablet, Rfl: 2 .  trolamine salicylate (ASPERCREME/ALOE) 10 % cream, Apply 1 application topically as needed for muscle pain., Disp: , Rfl:    Social History: Social History   Tobacco Use  . Smoking status: Never Smoker  . Smokeless tobacco: Never Used  Substance Use Topics  . Alcohol use: No    Alcohol/week: 0.0 standard drinks  . Drug use: No    Family Medical History: Family History  Problem Relation Age of Onset  . Heart disease Mother   . Cancer Father        leukemia  . Heart disease Brother        x2  . Cancer Daughter        breast  . Kidney cancer Neg Hx   . Bladder Cancer Neg Hx     Physical Examination: Vitals:   07/20/20 1337 07/20/20 1518  BP: (!) 170/91  (!) 148/110  Pulse:  96  Resp: 18 18  Temp:  97.6 F (36.4 C)  SpO2: 98% 96%     General: Patient is well developed, well nourished, calm, collected, and in no apparent distress.  Psychiatric: Patient is non-anxious.  Head:  Pupils equal, round, and reactive to light.  ENT:  Oral mucosa appears well hydrated.  Neck:   Supple.  Full range of motion.  Respiratory: Patient is breathing without any difficulty.   NEUROLOGICAL:  General: In no acute distress.   Awake, alert, disoriented to place, time, but oriented to person (daughter). Pupils equal round and reactive to light.  EOMI although she has difficulty following finger to R visual field as she says she cannot see out of this eye (confirmed by daughter). No nystagmus. Facial tone is symmetric.  Tongue protrusion is midline.  There is no pronator drift. Speech clear, able to repeat phrases. Stutter noted when repeating phrases (baseline).  Strength is symmetric to all extremities, she appears to be full strength throughout b/l UE, LE.   No evidence of cervical myelopathy.  No TTP over cervical spine although she endorses chronic neck pain.  SILT to extremities.  Imaging: CT Head Wo Contrast  Result Date: 07/20/2020 CLINICAL DATA:  Fall. EXAM: CT HEAD WITHOUT CONTRAST TECHNIQUE: Contiguous axial images were obtained from the base of the skull through the vertex without intravenous contrast. COMPARISON:  CT head dated June 27, 2020. FINDINGS: Brain: New 1.9 cm intraparenchymal hemorrhage in the left occipital lobe surrounding edema. No evidence of acute infarction, hydrocephalus, extra-axial collection or mass lesion/mass effect. Stable mild atrophy. Vascular: Atherosclerotic vascular calcification of the carotid siphons. No hyperdense vessel. Skull: Normal. Negative for fracture or focal lesion. Sinuses/Orbits: No acute finding. Other: None. IMPRESSION: 1. New 1.9 cm intraparenchymal hemorrhage in the left occipital lobe.  Critical Value/emergent results were called by telephone at the time of interpretation on 07/20/2020 at 1:15 pm to provider [facility] , who verbally acknowledged these results. Electronically Signed   By: Titus Dubin M.D.   On: 07/20/2020 13:17   CT Cervical Spine Wo Contrast  Result Date: 07/20/2020 CLINICAL DATA:  Neck trauma.  Additional history provided: EXAM: CT CERVICAL SPINE WITHOUT CONTRAST TECHNIQUE: Multidetector CT imaging of the cervical spine was performed without intravenous contrast. Multiplanar CT image reconstructions were also generated. COMPARISON:  CT of the cervical spine 06/27/2020. FINDINGS: Alignment: Straightening of the expected cervical lordosis. 2 mm C4-C5 grade 1 anterolisthesis. Trace C7-T1 grade 1 anterolisthesis. Skull base and vertebrae: The basion-dental and atlanto-dental intervals are maintained.No evidence of acute fracture to the cervical spine. Soft tissues and spinal canal: No prevertebral fluid or swelling. No visible canal hematoma. Disc levels: Cervical spondylosis with multilevel disc space narrowing, disc bulges, posterior disc osteo, uncovertebral hypertrophy and facet arthrosis. There is fusion across the facet joints at C2-C3 and C3-C4. Disc space narrowing is advanced at C3-C4, C5-C6, C6-C7, C7-T1 and T1-T2. Early osseous fusion across the C3-C4 disc space is also questioned. No high-grade bony spinal canal stenosis. Multilevel bony neural foraminal narrowing Upper chest: No consolidation within the imaged lung apices. No visible pneumothorax. Other: 11 mm left thyroid lobe nodule with small internal focus of calcification not meeting consensus criteria for ultrasound follow-up. IMPRESSION: No evidence of acute fracture to the cervical spine. Mild C4-C5 and C7-T1 grade 1 anterolisthesis Spondylosis of the cervical and visualized upper thoracic spine with sites of bony ankylosis as described. Electronically Signed   By: Kellie Simmering DO   On: 07/20/2020  13:20     Assessment and Plan: Ms. Hickson is a pleasant 86 y.o. female with a 1.9 cm intraparenchymal hemorrhage in the left occipital lobe after suffering a fall.  There is no acute neurosurgical intervention. Please repeat head CT in 6 hours from original.  If head CT stable, she is cleared to discharge from a neurosurgical standpoint.  Goal for normotension, avoid AC, NSAIDs,  blood thinners in setting of IPH.  Will arrange for f/u appt in 3 weeks with me, repeat head CT prior.   Lonell Face, NP Dept. of Neurosurgery

## 2020-07-20 NOTE — Discharge Instructions (Addendum)
Dr. Rhea Bleacher office will contact you for follow-up.  Return to the ER for new or worsening change in mental status, severe headache, vomiting, or any other new or worsening symptoms that are concerning.

## 2020-07-20 NOTE — ED Provider Notes (Signed)
-----------------------------------------   9:18 PM on 07/20/2020 -----------------------------------------  CT head after 6 hours shows no change.  The patient's clinical status is unchanged as well.  I discussed the case with Dr. Izora Ribas who advises that the patient is stable for discharge.  He will have the patient follow-up.  I updated the patient's family member on the results of the CT and the plan of care.  Return precautions given, and she expresses understanding.   Arta Silence, MD 07/20/20 2118

## 2020-07-20 NOTE — ED Triage Notes (Signed)
Patient fell at home while husband was trying to assist her to commode.  She fell backward. Hit back of head.  Pain and swelling to left hsoulder and left collarbone.  She is alert.  She has dementia.  Her daughter is with her now.

## 2020-07-24 ENCOUNTER — Other Ambulatory Visit: Payer: Self-pay | Admitting: Neurosurgery

## 2020-07-24 DIAGNOSIS — I619 Nontraumatic intracerebral hemorrhage, unspecified: Secondary | ICD-10-CM

## 2020-08-04 ENCOUNTER — Other Ambulatory Visit (HOSPITAL_COMMUNITY): Payer: Self-pay | Admitting: Adult Health

## 2020-08-04 ENCOUNTER — Other Ambulatory Visit: Payer: Self-pay | Admitting: Adult Health

## 2020-08-04 DIAGNOSIS — R296 Repeated falls: Secondary | ICD-10-CM

## 2020-08-23 ENCOUNTER — Ambulatory Visit
Admission: RE | Admit: 2020-08-23 | Discharge: 2020-08-23 | Disposition: A | Discharge status: Home / Self Care | Payer: Medicare (Managed Care) | Source: Ambulatory Visit | Attending: Adult Health | Admitting: Adult Health

## 2020-08-23 ENCOUNTER — Other Ambulatory Visit: Payer: Self-pay

## 2020-08-23 DIAGNOSIS — R296 Repeated falls: Secondary | ICD-10-CM

## 2020-08-25 ENCOUNTER — Ambulatory Visit: Payer: Medicare (Managed Care)

## 2021-01-13 ENCOUNTER — Inpatient Hospital Stay
Admission: EM | Admit: 2021-01-13 | Discharge: 2021-01-17 | DRG: 640 | Disposition: A | Discharge status: Skilled Nursing Facility | Payer: Medicare (Managed Care) | Attending: Hospitalist | Admitting: Hospitalist

## 2021-01-13 ENCOUNTER — Emergency Department: Payer: Medicare (Managed Care)

## 2021-01-13 ENCOUNTER — Other Ambulatory Visit: Payer: Self-pay

## 2021-01-13 ENCOUNTER — Observation Stay: Payer: Medicare (Managed Care)

## 2021-01-13 DIAGNOSIS — T796XXA Traumatic ischemia of muscle, initial encounter: Secondary | ICD-10-CM

## 2021-01-13 DIAGNOSIS — E538 Deficiency of other specified B group vitamins: Secondary | ICD-10-CM | POA: Diagnosis present

## 2021-01-13 DIAGNOSIS — E785 Hyperlipidemia, unspecified: Secondary | ICD-10-CM | POA: Diagnosis present

## 2021-01-13 DIAGNOSIS — H409 Unspecified glaucoma: Secondary | ICD-10-CM | POA: Diagnosis present

## 2021-01-13 DIAGNOSIS — M542 Cervicalgia: Secondary | ICD-10-CM | POA: Diagnosis present

## 2021-01-13 DIAGNOSIS — I272 Pulmonary hypertension, unspecified: Secondary | ICD-10-CM | POA: Diagnosis present

## 2021-01-13 DIAGNOSIS — H3530 Unspecified macular degeneration: Secondary | ICD-10-CM | POA: Diagnosis present

## 2021-01-13 DIAGNOSIS — I10 Essential (primary) hypertension: Secondary | ICD-10-CM | POA: Diagnosis present

## 2021-01-13 DIAGNOSIS — R630 Anorexia: Secondary | ICD-10-CM | POA: Diagnosis present

## 2021-01-13 DIAGNOSIS — D539 Nutritional anemia, unspecified: Secondary | ICD-10-CM

## 2021-01-13 DIAGNOSIS — Z888 Allergy status to other drugs, medicaments and biological substances status: Secondary | ICD-10-CM

## 2021-01-13 DIAGNOSIS — Z9049 Acquired absence of other specified parts of digestive tract: Secondary | ICD-10-CM

## 2021-01-13 DIAGNOSIS — G9341 Metabolic encephalopathy: Secondary | ICD-10-CM | POA: Diagnosis not present

## 2021-01-13 DIAGNOSIS — I110 Hypertensive heart disease with heart failure: Secondary | ICD-10-CM | POA: Diagnosis present

## 2021-01-13 DIAGNOSIS — Z90710 Acquired absence of both cervix and uterus: Secondary | ICD-10-CM

## 2021-01-13 DIAGNOSIS — H547 Unspecified visual loss: Secondary | ICD-10-CM

## 2021-01-13 DIAGNOSIS — G479 Sleep disorder, unspecified: Secondary | ICD-10-CM | POA: Diagnosis present

## 2021-01-13 DIAGNOSIS — K219 Gastro-esophageal reflux disease without esophagitis: Secondary | ICD-10-CM | POA: Diagnosis present

## 2021-01-13 DIAGNOSIS — H5462 Unqualified visual loss, left eye, normal vision right eye: Secondary | ICD-10-CM | POA: Diagnosis present

## 2021-01-13 DIAGNOSIS — I5032 Chronic diastolic (congestive) heart failure: Secondary | ICD-10-CM | POA: Diagnosis present

## 2021-01-13 DIAGNOSIS — Z9181 History of falling: Secondary | ICD-10-CM

## 2021-01-13 DIAGNOSIS — E860 Dehydration: Principal | ICD-10-CM

## 2021-01-13 DIAGNOSIS — F05 Delirium due to known physiological condition: Secondary | ICD-10-CM | POA: Diagnosis present

## 2021-01-13 DIAGNOSIS — Z6830 Body mass index (BMI) 30.0-30.9, adult: Secondary | ICD-10-CM

## 2021-01-13 DIAGNOSIS — Z96652 Presence of left artificial knee joint: Secondary | ICD-10-CM | POA: Diagnosis present

## 2021-01-13 DIAGNOSIS — Z8542 Personal history of malignant neoplasm of other parts of uterus: Secondary | ICD-10-CM

## 2021-01-13 DIAGNOSIS — N3946 Mixed incontinence: Secondary | ICD-10-CM | POA: Diagnosis present

## 2021-01-13 DIAGNOSIS — R410 Disorientation, unspecified: Secondary | ICD-10-CM

## 2021-01-13 DIAGNOSIS — G894 Chronic pain syndrome: Secondary | ICD-10-CM | POA: Diagnosis present

## 2021-01-13 DIAGNOSIS — R1310 Dysphagia, unspecified: Secondary | ICD-10-CM | POA: Diagnosis present

## 2021-01-13 DIAGNOSIS — N179 Acute kidney failure, unspecified: Secondary | ICD-10-CM

## 2021-01-13 DIAGNOSIS — E7800 Pure hypercholesterolemia, unspecified: Secondary | ICD-10-CM | POA: Diagnosis present

## 2021-01-13 DIAGNOSIS — E875 Hyperkalemia: Secondary | ICD-10-CM

## 2021-01-13 DIAGNOSIS — Z8249 Family history of ischemic heart disease and other diseases of the circulatory system: Secondary | ICD-10-CM

## 2021-01-13 DIAGNOSIS — F0390 Unspecified dementia without behavioral disturbance: Secondary | ICD-10-CM | POA: Diagnosis not present

## 2021-01-13 DIAGNOSIS — F418 Other specified anxiety disorders: Secondary | ICD-10-CM | POA: Diagnosis present

## 2021-01-13 DIAGNOSIS — M6282 Rhabdomyolysis: Secondary | ICD-10-CM

## 2021-01-13 DIAGNOSIS — Z760 Encounter for issue of repeat prescription: Secondary | ICD-10-CM

## 2021-01-13 DIAGNOSIS — F419 Anxiety disorder, unspecified: Secondary | ICD-10-CM | POA: Diagnosis present

## 2021-01-13 DIAGNOSIS — K5900 Constipation, unspecified: Secondary | ICD-10-CM | POA: Diagnosis present

## 2021-01-13 DIAGNOSIS — G4730 Sleep apnea, unspecified: Secondary | ICD-10-CM | POA: Diagnosis present

## 2021-01-13 DIAGNOSIS — R296 Repeated falls: Secondary | ICD-10-CM

## 2021-01-13 DIAGNOSIS — Z993 Dependence on wheelchair: Secondary | ICD-10-CM

## 2021-01-13 DIAGNOSIS — Z885 Allergy status to narcotic agent status: Secondary | ICD-10-CM

## 2021-01-13 DIAGNOSIS — I4510 Unspecified right bundle-branch block: Secondary | ICD-10-CM | POA: Diagnosis present

## 2021-01-13 DIAGNOSIS — Z66 Do not resuscitate: Secondary | ICD-10-CM | POA: Diagnosis present

## 2021-01-13 DIAGNOSIS — Z20822 Contact with and (suspected) exposure to covid-19: Secondary | ICD-10-CM | POA: Diagnosis present

## 2021-01-13 LAB — CBC WITH DIFFERENTIAL/PLATELET
Abs Immature Granulocytes: 0.05 10*3/uL (ref 0.00–0.07)
Basophils Absolute: 0.1 10*3/uL (ref 0.0–0.1)
Basophils Relative: 1 %
Eosinophils Absolute: 0.4 10*3/uL (ref 0.0–0.5)
Eosinophils Relative: 4 %
HCT: 34.3 % — ABNORMAL LOW (ref 36.0–46.0)
Hemoglobin: 10.7 g/dL — ABNORMAL LOW (ref 12.0–15.0)
Immature Granulocytes: 1 %
Lymphocytes Relative: 13 %
Lymphs Abs: 1.3 10*3/uL (ref 0.7–4.0)
MCH: 31.9 pg (ref 26.0–34.0)
MCHC: 31.2 g/dL (ref 30.0–36.0)
MCV: 102.4 fL — ABNORMAL HIGH (ref 80.0–100.0)
Monocytes Absolute: 0.8 10*3/uL (ref 0.1–1.0)
Monocytes Relative: 8 %
Neutro Abs: 7.4 10*3/uL (ref 1.7–7.7)
Neutrophils Relative %: 73 %
Platelets: 254 10*3/uL (ref 150–400)
RBC: 3.35 MIL/uL — ABNORMAL LOW (ref 3.87–5.11)
RDW: 12.6 % (ref 11.5–15.5)
WBC: 10.1 10*3/uL (ref 4.0–10.5)
nRBC: 0 % (ref 0.0–0.2)

## 2021-01-13 LAB — URINALYSIS, COMPLETE (UACMP) WITH MICROSCOPIC
Bacteria, UA: NONE SEEN
Bilirubin Urine: NEGATIVE
Glucose, UA: NEGATIVE mg/dL
Hgb urine dipstick: NEGATIVE
Ketones, ur: NEGATIVE mg/dL
Leukocytes,Ua: NEGATIVE
Nitrite: NEGATIVE
Protein, ur: NEGATIVE mg/dL
Specific Gravity, Urine: 1.02 (ref 1.005–1.030)
Squamous Epithelial / HPF: NONE SEEN (ref 0–5)
pH: 5 (ref 5.0–8.0)

## 2021-01-13 LAB — COMPREHENSIVE METABOLIC PANEL
ALT: 18 U/L (ref 0–44)
AST: 26 U/L (ref 15–41)
Albumin: 3.2 g/dL — ABNORMAL LOW (ref 3.5–5.0)
Alkaline Phosphatase: 95 U/L (ref 38–126)
Anion gap: 6 (ref 5–15)
BUN: 49 mg/dL — ABNORMAL HIGH (ref 8–23)
CO2: 21 mmol/L — ABNORMAL LOW (ref 22–32)
Calcium: 8.9 mg/dL (ref 8.9–10.3)
Chloride: 105 mmol/L (ref 98–111)
Creatinine, Ser: 2 mg/dL — ABNORMAL HIGH (ref 0.44–1.00)
GFR, Estimated: 23 mL/min — ABNORMAL LOW (ref >60)
Glucose, Bld: 126 mg/dL — ABNORMAL HIGH (ref 70–99)
Potassium: 5.9 mmol/L — ABNORMAL HIGH (ref 3.5–5.1)
Sodium: 132 mmol/L — ABNORMAL LOW (ref 135–145)
Total Bilirubin: 1 mg/dL (ref 0.3–1.2)
Total Protein: 6.5 g/dL (ref 6.5–8.1)

## 2021-01-13 LAB — RESP PANEL BY RT-PCR (FLU A&B, COVID) ARPGX2
Influenza A by PCR: NEGATIVE
Influenza B by PCR: NEGATIVE
SARS Coronavirus 2 by RT PCR: NEGATIVE

## 2021-01-13 LAB — MAGNESIUM: Magnesium: 2.1 mg/dL (ref 1.7–2.4)

## 2021-01-13 LAB — TROPONIN I (HIGH SENSITIVITY)
Troponin I (High Sensitivity): 36 ng/L — ABNORMAL HIGH (ref <18)
Troponin I (High Sensitivity): 79 ng/L — ABNORMAL HIGH (ref <18)

## 2021-01-13 LAB — LIPASE, BLOOD: Lipase: 19 U/L (ref 11–51)

## 2021-01-13 LAB — FOLATE: Folate: 4.7 ng/mL — ABNORMAL LOW (ref >5.9)

## 2021-01-13 LAB — POTASSIUM: Potassium: 5.5 mmol/L — ABNORMAL HIGH (ref 3.5–5.1)

## 2021-01-13 LAB — TSH: TSH: 2.282 u[IU]/mL (ref 0.350–4.500)

## 2021-01-13 LAB — CK: Total CK: 436 U/L — ABNORMAL HIGH (ref 38–234)

## 2021-01-13 MED ORDER — TRAVOPROST (BAK FREE) 0.004 % OP SOLN
1.0000 [drp] | Freq: Every day | OPHTHALMIC | Status: DC
  Administered 2021-01-16: 1 [drp] via OPHTHALMIC
  Filled 2021-01-13 (×2): qty 2.5

## 2021-01-13 MED ORDER — OXYBUTYNIN 3.9 MG/24HR TD PTTW: 1.0000 | MEDICATED_PATCH | TRANSDERMAL | Status: DC

## 2021-01-13 MED ORDER — VITAMIN B-12 1000 MCG PO TABS
1000.0000 ug | ORAL_TABLET | Freq: Every day | ORAL | Status: DC
  Administered 2021-01-14 – 2021-01-17 (×4): 1000 ug via ORAL
  Filled 2021-01-13 (×5): qty 1

## 2021-01-13 MED ORDER — OXYCODONE HCL 5 MG PO TABS: 5.0000 mg | ORAL_TABLET | ORAL | Status: DC | PRN

## 2021-01-13 MED ORDER — ACETAMINOPHEN 650 MG RE SUPP: 650.0000 mg | Freq: Four times a day (QID) | RECTAL | Status: DC | PRN

## 2021-01-13 MED ORDER — SODIUM CHLORIDE 0.9 % IV BOLUS
500.0000 mL | Freq: Once | INTRAVENOUS | Status: AC
  Administered 2021-01-13: 500 mL via INTRAVENOUS

## 2021-01-13 MED ORDER — BRIMONIDINE TARTRATE 0.15 % OP SOLN
1.0000 [drp] | Freq: Two times a day (BID) | OPHTHALMIC | Status: DC
  Administered 2021-01-14 – 2021-01-17 (×6): 1 [drp] via OPHTHALMIC
  Filled 2021-01-13 (×2): qty 5

## 2021-01-13 MED ORDER — POLYETHYLENE GLYCOL 3350 17 G PO PACK
17.0000 g | PACK | Freq: Every day | ORAL | Status: DC | PRN
  Administered 2021-01-14: 17 g via ORAL
  Filled 2021-01-13: qty 1

## 2021-01-13 MED ORDER — MELATONIN 5 MG PO TABS
10.0000 mg | ORAL_TABLET | Freq: Every day | ORAL | Status: DC
  Administered 2021-01-14 – 2021-01-16 (×3): 10 mg via ORAL
  Filled 2021-01-13 (×5): qty 2

## 2021-01-13 MED ORDER — SODIUM CHLORIDE 0.9 % IV SOLN: INTRAVENOUS | Status: DC

## 2021-01-13 MED ORDER — CALCIUM CARBONATE-VITAMIN D 500-200 MG-UNIT PO TABS
1.0000 | ORAL_TABLET | Freq: Every day | ORAL | Status: DC
  Administered 2021-01-14 – 2021-01-17 (×4): 1 via ORAL
  Filled 2021-01-13 (×4): qty 1

## 2021-01-13 MED ORDER — SODIUM CHLORIDE 0.9 % IV BOLUS
1000.0000 mL | Freq: Once | INTRAVENOUS | Status: AC
  Administered 2021-01-13: 1000 mL via INTRAVENOUS

## 2021-01-13 MED ORDER — MORPHINE SULFATE (PF) 2 MG/ML IV SOLN
2.0000 mg | Freq: Once | INTRAVENOUS | Status: AC
  Administered 2021-01-13: 2 mg via INTRAVENOUS
  Filled 2021-01-13: qty 1

## 2021-01-13 MED ORDER — ONDANSETRON HCL 4 MG/2ML IJ SOLN
4.0000 mg | Freq: Once | INTRAMUSCULAR | Status: AC
  Administered 2021-01-13: 4 mg via INTRAVENOUS
  Filled 2021-01-13: qty 2

## 2021-01-13 MED ORDER — GABAPENTIN 100 MG PO CAPS
100.0000 mg | ORAL_CAPSULE | Freq: Every day | ORAL | Status: DC
  Administered 2021-01-14 – 2021-01-16 (×3): 100 mg via ORAL
  Filled 2021-01-13 (×4): qty 1

## 2021-01-13 MED ORDER — SODIUM CHLORIDE 0.9% FLUSH
3.0000 mL | Freq: Two times a day (BID) | INTRAVENOUS | Status: DC
  Administered 2021-01-13 – 2021-01-17 (×8): 3 mL via INTRAVENOUS

## 2021-01-13 MED ORDER — OXYCODONE HCL 5 MG PO TABS
5.0000 mg | ORAL_TABLET | Freq: Two times a day (BID) | ORAL | Status: DC | PRN
  Administered 2021-01-14 – 2021-01-16 (×3): 5 mg via ORAL
  Filled 2021-01-13 (×4): qty 1

## 2021-01-13 MED ORDER — PANTOPRAZOLE SODIUM 40 MG PO TBEC
40.0000 mg | DELAYED_RELEASE_TABLET | Freq: Every day | ORAL | Status: DC
  Administered 2021-01-14 – 2021-01-17 (×4): 40 mg via ORAL
  Filled 2021-01-13 (×4): qty 1

## 2021-01-13 MED ORDER — SERTRALINE HCL 50 MG PO TABS
150.0000 mg | ORAL_TABLET | Freq: Every day | ORAL | Status: DC
  Administered 2021-01-14 – 2021-01-17 (×4): 150 mg via ORAL
  Filled 2021-01-13 (×4): qty 3

## 2021-01-13 MED ORDER — ACETAMINOPHEN 325 MG PO TABS
650.0000 mg | ORAL_TABLET | Freq: Four times a day (QID) | ORAL | Status: DC | PRN
  Administered 2021-01-16: 22:00:00 650 mg via ORAL
  Filled 2021-01-13: qty 2

## 2021-01-13 MED ORDER — ENOXAPARIN SODIUM 30 MG/0.3ML ~~LOC~~ SOLN
30.0000 mg | SUBCUTANEOUS | Status: DC
  Administered 2021-01-13 – 2021-01-14 (×2): 30 mg via SUBCUTANEOUS
  Filled 2021-01-13 (×3): qty 0.3

## 2021-01-13 MED ORDER — MEMANTINE HCL 5 MG PO TABS
10.0000 mg | ORAL_TABLET | Freq: Two times a day (BID) | ORAL | Status: DC
  Administered 2021-01-14 – 2021-01-17 (×7): 10 mg via ORAL
  Filled 2021-01-13 (×8): qty 2

## 2021-01-13 MED ORDER — MIRTAZAPINE 15 MG PO TBDP
15.0000 mg | ORAL_TABLET | Freq: Every day | ORAL | Status: DC
  Administered 2021-01-14 – 2021-01-16 (×3): 15 mg via ORAL
  Filled 2021-01-13 (×5): qty 1

## 2021-01-13 NOTE — ED Notes (Signed)
Pt desat to 82% and placed on 2L [place]

## 2021-01-13 NOTE — H&P (Signed)
History and Physical   [name] FHL:[id number] DOB: January 07, 1926 DOA: 01/13/2021  PCP: [facility] [place]   Patient coming from: Home  Chief Complaint: Confusion, frequent falls  HPI: [name] is a 87 y.o. female with medical history significant of dementia, anxiety, depression, cervical spine disease, chronic headaches, chronic pain, osteoarthritis, hypertension, chronic falls, GERD, hyperlipidemia, decreased vision, urinary incontinence, pulmonary hypertension, uterine cancer status post hysterectomy, right bundle branch block, wears dentures who presents with altered mental status and history of frequent falls.  History obtained with assistance of patient's daughter and chart reviewed given her altered mental status and history of dementia.  As above patient has a history of falls.  She is currently wheelchair-bound and they use a lift to get her to and from a chair or the bed to the wheelchair or the toilet.  Daughter states that patient's fall occurred when lifting her to standing.  Since that time patient has been unable to help support her weight when using the left.  She has had increased confusion since her fall yesterday as well.  Daughter states that her confusion is manifested by not recognizing her daughters which he usually can.  In the ED vaginally reported alert and oriented to self only typically she is oriented x4. Patient also has been reporting headache and hip pain on arrival.  Pain significantly improved with morphine in the ED.  Unable to get full review of systems due to dementia however daughter states that other than new pain from the fall her mother has not reported feeling unwell.    ED Course: Vital signs in the ED significant for blood pressure in the 100s to 110s.  Lab work-up showed CMP with sodium 132, potassium 5.9, bicarb 21, gap normal, BUN 49, creatinine 2 from baseline of 1, glucose 126, albumin 3.2.  CBC showed hemoglobin  10.7 down from baseline around 12 with MCV greater than 100.  CK elevated at 436, lipase normal, initial troponin 3650 pending, respiratory panel flu COVID negative, urinalysis within normal limits, urine culture pending.  Imaging showed chest x-ray without acute disease, right hip x-ray without acute disease but recommended CT for clarification, CT hip showed no acute normality, right knee x-ray showed no acute normality and stable OA, CT head no acute intracranial abnormality, CT C-spine no acute abnormality but does show her chronic osteoarthritic disease.  MR brain ordered and pending.  In the ED, patient received 1 L IV fluids, 2 mg morphine, dose of Zofran.  Review of Systems: Unable to be obtained due to patient's confusion, not reporting feeling unwell per daughter's report.  Past Medical History:  Diagnosis Date  . Anxiety   . Arthritis   . Cancer [facility])    uterine and questionable vulvar, s/p hysterectomy  . Chronic headaches   . Cystocele   . Dementia ([place])    able to sign own papers  . Depression   . GERD (gastroesophageal reflux disease)   . Glaucoma   . Hypercholesterolemia   . Hypertension   . Macular degeneration   . Reactive airway disease   . Rectocele   . Right bundle branch block   . Sleep apnea   . Uterine cancer ([place])   . Vertigo    none - several yrs  . Wears dentures    full upper and lower    Past Surgical History:  Procedure Laterality Date  . ABDOMINAL HYSTERECTOMY    . ABDOMINAL HYSTERECTOMY    .  APPENDECTOMY    . CHOLECYSTECTOMY    . IMAGE GUIDED SINUS SURGERY N/A 06/20/2015   Procedure: IMAGE GUIDED SINUS SURGERY;  Surgeon: Clyde Canterbury, MD;  Location: [place];  Service: ENT;  Laterality: N/A;  GAVE DISK TO CE CE  . REPLACEMENT TOTAL KNEE Left   . SPHENOIDECTOMY Bilateral 06/20/2015   Procedure: SPHENOIDECTOMY;  Surgeon: Clyde Canterbury, MD;  Location: [place];  Service: ENT;  Laterality: Bilateral;  . uterine cancer    .  VULVECTOMY      Social History  reports that she has never smoked. She has never used smokeless tobacco. She reports that she does not drink alcohol and does not use drugs.  Allergies  Allergen Reactions  . Ambien [Zolpidem Tartrate] Other (See Comments)    "hallucination"  . Codeine Itching    Family History  Problem Relation Age of Onset  . Heart disease Mother   . Cancer Father        leukemia  . Heart disease Brother        x2  . Cancer Daughter        breast  . Kidney cancer Neg Hx   . Bladder Cancer Neg Hx   Reviewed on admission   Prior to Admission medications   Medication Sig Start Date End Date Taking? Authorizing Provider  acetaminophen (TYLENOL) 500 MG tablet Take 1,000 mg by mouth 2 (two) times daily.    [provider]  albuterol (VENTOLIN HFA) 108 (90 Base) MCG/ACT inhaler Inhale 2 puffs into the lungs every 3 (three) hours as needed for wheezing, cough or shortness of breath. 04/26/20   [provider]  brimonidine (ALPHAGAN P) 0.1 % SOLN Place 1 drop into both eyes 2 (two) times daily.    [provider]  Calcium Carbonate-Vitamin D3 (CALCIUM 600-D) 600-400 MG-UNIT TABS Take 1 tablet by mouth daily.    [provider]  cholecalciferol (VITAMIN D) 25 MCG (1000 UNIT) tablet Take 1,000 Units by mouth daily. 04/17/20   [provider]  CYMBALTA 30 MG capsule Take 30 mg by mouth daily. 03/07/20   [provider]  diclofenac Sodium (VOLTAREN) 1 % GEL Apply 2 g topically every 4 (four) hours as needed (left shoulder pain).    [provider]  estradiol (ESTRACE) 0.1 MG/GM vaginal cream Place 1 Applicatorful vaginally every Monday, Wednesday, and Friday.    [provider]  gabapentin (NEURONTIN) 100 MG capsule Take 100 mg by mouth at bedtime.    [provider]  lidocaine (LIDODERM) 5 % Place 1 patch onto the skin daily as needed. Remove & Discard patch within 12 hours or as directed by MD     [provider]  melatonin 5 MG TABS Take 10 mg by mouth at bedtime.    [provider]  memantine (NAMENDA) 10 MG tablet Take 10 mg by mouth 2 (two) times daily.    [provider]  mirtazapine (REMERON SOL-TAB) 15 MG disintegrating tablet Take 15 mg by mouth at bedtime.    [provider]  Multiple Vitamins-Minerals (PRESERVISION AREDS) TABS Take 1 tablet by mouth daily.     [provider]  omeprazole (PRILOSEC) 20 MG capsule Take 1 capsule (20 mg total) by mouth 2 (two) times daily before a meal. 12/30/19   Earleen Newport, MD  oxybutynin (OXYTROL) 3.9 MG/24HR Place 1 patch onto the skin 2 (two) times a week. (apply on Monday and Friday)    [provider]  oxyCODONE (OXY IR/ROXICODONE) 5 MG immediate release tablet Take 5 mg by mouth 2 (two) times daily.    [provider]  PROLIA 60 MG/ML SOSY injection Inject 60 mg into the skin every 6 (six) months. 03/21/20   [provider]  sertraline (ZOLOFT) 50 MG tablet Take 150 mg by mouth daily.     [provider]  SM CALCIUM 600/VITAMIN D 600-400 MG-UNIT tablet Take 1 tablet by mouth daily. 04/17/20   [provider]  traMADol (ULTRAM) 50 MG tablet Take 1 tablet (50 mg total) by mouth 2 (two) times daily. Must last 30 days. 08/26/18 11/24/18  Milinda Pointer, MD  travoprost, benzalkonium, (TRAVATAN) 0.004 % ophthalmic solution Place 1 drop into both eyes at bedtime.    [provider]  trolamine salicylate (ASPERCREME/ALOE) 10 % cream Apply 1 application topically as needed for muscle pain.    [provider]  vitamin B-12 (CYANOCOBALAMIN) 1000 MCG tablet Take 1,000 mcg by mouth daily. 04/17/20   [provider]    Physical Exam: Vitals:   01/13/21 1530 01/13/21 1641 01/13/21 1759 01/13/21 1848  BP: 111/60 (!) 106/45 (!) 99/58 (!) 117/54  Pulse: 70 75 66 77  Resp: 18 16 18 18   Temp:    97.6 F (36.4 C)  TempSrc:     Axillary  SpO2: 93% 90% 100% 99%  Weight:      Height:       Physical Exam Constitutional:      General: She is not in acute distress.    Appearance: Normal appearance.     Comments: Drowsy, comfortable appear elderly female  HENT:     Head: Normocephalic and atraumatic.     Mouth/Throat:     Mouth: Mucous membranes are moist.     Pharynx: Oropharynx is clear.  Eyes:     Extraocular Movements: Extraocular movements intact.     Pupils: Pupils are equal, round, and reactive to light.  Cardiovascular:     Rate and Rhythm: Normal rate and regular rhythm.     Pulses: Normal pulses.     Heart sounds: Murmur heard.    Pulmonary:     Effort: Pulmonary effort is normal. No respiratory distress.     Breath sounds: Normal breath sounds.  Abdominal:     General: Bowel sounds are normal. There is no distension.     Palpations: Abdomen is soft.     Tenderness: There is no abdominal tenderness.  Musculoskeletal:        General: No swelling or deformity.  Skin:    General: Skin is warm and dry.  Neurological:     General: No focal deficit present.     Comments: Alert and oriented to self only.  Daughter states that since she been in the ED patient did recognize her.    Labs on Admission: I have personally reviewed following labs and imaging studies  CBC: Recent Labs  Lab 01/13/21 1530  WBC 10.1  NEUTROABS 7.4  HGB 10.7*  HCT 34.3*  MCV 102.4*  PLT 371    Basic Metabolic Panel: Recent Labs  Lab 01/13/21 1530  NA 132*  K 5.9*  CL 105  CO2 21*  GLUCOSE 126*  BUN 49*  CREATININE 2.00*  CALCIUM 8.9    GFR: Estimated Creatinine Clearance: 18.3 mL/min (A) (by C-G formula based on SCr of 2 mg/dL (H)).  Liver Function Tests: Recent Labs  Lab 01/13/21 1530  AST 26  ALT 18  ALKPHOS  95  BILITOT 1.0  PROT 6.5  ALBUMIN 3.2*    Urine analysis:    Component Value Date/Time   COLORURINE YELLOW (A) 01/13/2021 1731   APPEARANCEUR HAZY (A) 01/13/2021 1731    APPEARANCEUR Hazy (A) 12/10/2016 1341   LABSPEC 1.020 01/13/2021 1731   PHURINE 5.0 01/13/2021 1731   GLUCOSEU NEGATIVE 01/13/2021 1731   GLUCOSEU NEGATIVE 01/23/2017 1207   HGBUR NEGATIVE 01/13/2021 1731   BILIRUBINUR NEGATIVE 01/13/2021 1731   BILIRUBINUR Negative 12/10/2016 1341   KETONESUR NEGATIVE 01/13/2021 1731   PROTEINUR NEGATIVE 01/13/2021 1731   UROBILINOGEN 0.2 01/23/2017 1207   NITRITE NEGATIVE 01/13/2021 1731   LEUKOCYTESUR NEGATIVE 01/13/2021 1731    Radiological Exams on Admission: DG Chest 1 View  Result Date: 01/13/2021 CLINICAL DATA:  Altered mental status.  Frequent falls. EXAM: CHEST  1 VIEW COMPARISON:  Chest x-ray dated December 30, 2019. FINDINGS: The heart size and mediastinal contours are within normal limits. Normal pulmonary vascularity. Low lung volumes with chronic left lower lobe atelectasis/scarring. No focal consolidation, pleural effusion, or pneumothorax. No acute osseous abnormality. Severe bilateral glenohumeral osteoarthritis. IMPRESSION: 1. No active disease. Electronically Signed   By: Titus Dubin M.D.   On: 01/13/2021 16:32   CT Head Wo Contrast  Result Date: 01/13/2021 CLINICAL DATA:  Head trauma with mental status change. EXAM: CT HEAD WITHOUT CONTRAST TECHNIQUE: Contiguous axial images were obtained from the base of the skull through the vertex without intravenous contrast. COMPARISON:  August 23, 2020 FINDINGS: Brain: No evidence of acute infarction, hemorrhage, hydrocephalus, extra-axial collection or mass lesion/mass effect. Vascular: No hyperdense vessel or unexpected calcification. Skull: Normal. Negative for fracture or focal lesion. Sinuses/Orbits: No acute finding. Other: None. IMPRESSION: No acute intracranial abnormality. Electronically Signed   By: Fidela Salisbury M.D.   On: 01/13/2021 16:39   CT Cervical Spine Wo Contrast  Result Date: 01/13/2021 CLINICAL DATA:  Neck trauma. EXAM: CT CERVICAL SPINE WITHOUT CONTRAST TECHNIQUE:  Multidetector CT imaging of the cervical spine was performed without intravenous contrast. Multiplanar CT image reconstructions were also generated. COMPARISON:  July 20, 2020 FINDINGS: Alignment: Normal. Skull base and vertebrae: No acute fracture. No primary bone lesion or focal pathologic process. Soft tissues and spinal canal: No prevertebral fluid or swelling. No visible canal hematoma. Disc levels:  Multilevel osteoarthritic changes. Upper chest: Negative. Other: None. IMPRESSION: 1. No evidence of acute traumatic injury to the cervical spine. 2. Multilevel osteoarthritic changes of the cervical spine. Electronically Signed   By: Fidela Salisbury M.D.   On: 01/13/2021 16:42   CT Hip Right Wo Contrast  Result Date: 01/13/2021 CLINICAL DATA:  Right hip pain.  Frequent falls. EXAM: CT OF THE RIGHT HIP WITHOUT CONTRAST TECHNIQUE: Multidetector CT imaging of the right hip was performed according to the standard protocol. Multiplanar CT image reconstructions were also generated. COMPARISON:  Right hip x-rays from same day. FINDINGS: Bones/Joint/Cartilage No fracture or dislocation. Mild right hip osteoarthritis with chondrocalcinosis. Moderate degenerative changes of the pubic symphysis. No joint effusion. Ligaments Ligaments are suboptimally evaluated by CT. Muscles and Tendons Grossly intact.  Gluteus minimus and medius muscle atrophy. Soft tissue Small amount of subcutaneous edema overlying the right hip laterally. No fluid collection or hematoma. No soft tissue mass. IMPRESSION: 1. No acute osseous abnormality. 2. Mild right hip osteoarthritis. Electronically Signed   By: Titus Dubin M.D.   On: 01/13/2021 17:37   DG Knee Complete 4 Views Right  Result Date: 01/13/2021 CLINICAL DATA:  Right knee pain.  Frequent falls. EXAM: RIGHT KNEE - COMPLETE 4+ VIEW COMPARISON:  Right knee x-rays dated July 14, 2018. FINDINGS: No acute fracture or dislocation. No joint effusion. Relatively unchanged bulky  tricompartmental marginal osteophytes and severe patellofemoral joint space narrowing. Chondrocalcinosis again noted. Osteopenia. Soft tissues are unremarkable. IMPRESSION: 1. No acute osseous abnormality. 2. 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 Electronically Signed   By: Titus Dubin M.D.   On: 01/13/2021 16:35   DG Hip Unilat W or Wo Pelvis 2-3 Views Right  Result Date: 01/13/2021 CLINICAL DATA:  Hip pain after a fall. EXAM: DG HIP (WITH OR WITHOUT PELVIS) 2-3V RIGHT COMPARISON:  None. FINDINGS: Frontal pelvis shows diffuse bony demineralization. Degenerative changes evident in both hips. Probable old left superior and inferior pubic rami fractures. AP and cross-table lateral views of the right hip were obtained. Cross-table lateral view is nondiagnostic due to underpenetration. No femoral neck fracture is discernible. IMPRESSION: 1. No femoral neck fracture evident although cross-table lateral view is nondiagnostic due to underpenetration. If there is high clinical index of suspicion for right femoral neck injury, consider follow-up CT or MRI to further evaluate. Electronically Signed   By: Misty Stanley M.D.   On: 01/13/2021 16:33   EKG: Independently reviewed. Sinus rhythm at 73 bpm right bundle branch block, read on EKG shows left posterior fascicular block.  Similar to previous.  Assessment/Plan Principal Problem:   Acute metabolic encephalopathy Active Problems:   Essential hypertension, benign   Loss of vision   Cervicalgia   Frequent falls   GERD (gastroesophageal reflux disease)   Dementia (HCC)   Mixed stress and urge urinary incontinence   Sleep disorder   Anxiety disorder   Depression with anxiety   Visual loss   Chronic pain syndrome   Hyperkalemia   AKI (acute kidney injury) ([place])   Rhabdomyolysis   Macrocytic anemia  Dementia Acute Metabolic Encephalopathy > Likely related to her dehydration/AKI in the setting of  Dementia > No sign of infectious source with normal chest x-ray and urinalysis and no leukocytosis or fever > Electrolytes show mildly elevated potassium at 5.9, normal calcium.  Will check magnesium > CT head without acute intracranial abnormality in ED.  MR brain ordered and is pending. > We will add on TSH for completeness - Monitor on telemetry - Check magnesium - Check TSH - Follow-up MR brain - Continue home [place]  Chronic falls > Has been an issue since least 2016 according to chart > Is currently wheelchair bound, will have PT/OT evaluate for strengthening exercises - PT/OT  AKI Hyperkalemia > Creatinine elevated to 2 in the ED up from baseline around 1. > Potassium noted to be 5.9 in the ED. > We will repeat potassium level now that she has received IV fluids to evaluate if intervention is needed - Monitoring on telemetry as above - Continue IV fluids - Trend renal function and electrolytes - Recheck potassium  Mild rhabdomyolysis > CK mildly elevated at 435 in the setting of AKI/dehydration and falls - We will recheck in the morning after receiving IV fluids  Anemia > Hemoglobin mildly low at 10.7 from baseline of around 12. > MCV noted to be greater than 100.  Is currently prescribed vitamin B12. - We will check folate and B12.  Anxiety Depression - Continue home mirtazapine, sertraline  Cervical spine disease Chronic headaches Chronic pain Osteoarthritis - Continue home oxycodone, gabapentin - Hold home Naproxen given AKI  Hypertension  Pulmonary hypertension > Soft blood  pressure in ED after receiving morphine and in the setting of AKI - Giving IV fluids, holding home lisinopril given AKI  GERD - Continue home PPI  Decreased vision /blindness > Left sided blindness with cataract and right-sided deficits due to macular degeneration - Continue home brimonidine and travoprost eyedrops  Urinary incontinence - Continue oxybutynin patches  Wears  dentures Hx RBBB Uterine cancer status post hysterectomy - Noted    DVT prophylaxis: Lovenox  Code Status:   DNR  Family Communication:  Daughter updated at bedside  Disposition Plan:   Patient is from:  Home  Anticipated DC to:  Pending evaluation  Anticipated DC date:  1 to 3 days  Anticipated DC barriers: None  Consults called:  None  Admission status:  Observation, telemetry  Severity of Illness: The appropriate patient status for this patient is OBSERVATION. Observation status is judged to be reasonable and necessary in order to provide the required intensity of service to ensure the patient's safety. The patient's presenting symptoms, physical exam findings, and initial radiographic and laboratory data in the context of their medical condition is felt to place them at decreased risk for further clinical deterioration. Furthermore, it is anticipated that the patient will be medically stable for discharge from the hospital within 2 midnights of admission. The following factors support the patient status of observation.   " The patient's presenting symptoms include frequent falls, altered mental status. " The physical exam findings include confusion, drowsy, oriented self only, murmur. " The initial radiographic and laboratory data are potassium 5.9, creatinine 2 from baseline of 1, glucose 126, hemoglobin 10.7.   Marcelyn Bruins MD [facility]  How to contact the [facility] Attending or Consulting provider [place] or covering provider during after hours [place], for this patient?   1. Check the care team in [facility] and look for a) attending/consulting [facility] provider listed and b) the [facility] team listed 2. Log into [email or web address] and use [facility]'s universal password to access. If you do not have the password, please contact the hospital operator. 3. Locate the [facility] provider you are looking for under [facility] and page to a number that you can be directly reached. 4. If you  still have difficulty reaching the provider, please page the [facility] (Director on Call) for the Hospitalists listed on amion for assistance.  01/13/2021, 7:33 PM

## 2021-01-13 NOTE — ED Triage Notes (Signed)
Pt brought in via EMS for frequent falls.  Daughter states she fell yesterday evening and has had increased confusion since the fall, not recognizing daughters.  Pt c/o headache.  Pt alert to self only, normally AAO x 4.  CBG 150

## 2021-01-13 NOTE — ED Notes (Signed)
Pt taken for scans 

## 2021-01-13 NOTE — ED Notes (Addendum)
Nurse called to bedside by daughter for bp on monitor reading 86/43 (MAP 57),- this nurse subsequently rechecked bp and current reading 96/50 (MAP 64)

## 2021-01-13 NOTE — ED Provider Notes (Signed)
[facility] Emergency Department Provider Note  ____________________________________________  Time seen: Approximately 5:07 PM  I have reviewed the triage vital signs and the nursing notes.   HISTORY  Chief Complaint Fall and Altered Mental Status    Level 5 Caveat: Portions of the History and Physical including HPI and review of systems are unable to be completely obtained due to patient altered mental status  HPI [name] is a 87 y.o. female with a history of GERD, dementia, depression, hypertension who is brought to the ED by her daughter due to multiple falls recently.  Fell yesterday evening and has had increased confusion since then, not recognizing family members, complaining of headache, much less talking and activity than usual.  Has had poor oral intake recently.  In addition to headache, patient complains of pain over the posterior pelvis.     Past Medical History:  Diagnosis Date  . Anxiety   . Arthritis   . Cancer [facility])    uterine and questionable vulvar, s/p hysterectomy  . Chronic headaches   . Cystocele   . Dementia ([place])    able to sign own papers  . Depression   . GERD (gastroesophageal reflux disease)   . Glaucoma   . Hypercholesterolemia   . Hypertension   . Macular degeneration   . Reactive airway disease   . Rectocele   . Right bundle branch block   . Sleep apnea   . Uterine cancer ([place])   . Vertigo    none - several yrs  . Wears dentures    full upper and lower     Patient Active Problem List   Diagnosis Date Noted  . Osteoarthritis of glenohumeral joint (Severe) (Bilateral) 08/26/2018  . Osteoarthritis of AC (acromioclavicular) joint (Severe) (Bilateral) 08/26/2018  . Chronic acromioclavicular [facility]) joint pain (Bilateral) 08/26/2018  . Spondylolisthesis, cervical region (Multilevel) 08/13/2018  . Spondylolisthesis, cervicothoracic region (Multilevel) 08/13/2018  . Cervical foraminal stenosis (Severe  C4-5 through C6-7) 08/13/2018  . Cervical radiculitis (Bilateral) 08/13/2018  . Cervical  Grade 1 Anterolisthesis of C4/5, C5/6, & C7/T1 08/13/2018  . Cervical facet arthropathy (Severe) (Right: C4-5, C5-6) 08/13/2018  . Cervical facet syndrome (Bilateral) (R>L) 08/13/2018  . Cervical radiculopathy due to osteoarthritis of spine 08/13/2018  . Hx of total knee replacement (Left) 07/27/2018  . DDD (degenerative disc disease), cervical 07/27/2018  . Chronic shoulder pain (Primary Area of Pain) (Bilateral) (R>L) 07/14/2018  . Chronic knee pain (Secondary Area of Pain) (Right) 07/14/2018  . Chronic hip pain [facility] Area of Pain) (Bilateral) (R>L) 07/14/2018  . Chronic pain syndrome 07/14/2018  . Pharmacologic therapy 07/14/2018  . Disorder of skeletal system 07/14/2018  . Problems influencing health status 07/14/2018  . Hyperlipidemia 03/02/2016  . Dementia ([place]) 03/02/2016  . Vertigo 03/01/2016  . Frequent falls 12/04/2014  . Hyperglycemia 12/04/2014  . Health care maintenance 12/04/2014  . Encounter for general adult medical examination without abnormal findings 12/04/2014  . URI (upper respiratory infection) 10/24/2014  . OAB (overactive bladder) 06/17/2014  . Pain of left thumb 05/31/2014  . Pain of finger of left hand 05/31/2014  . Rectocele 03/17/2014  . Vaginal atrophy 03/17/2014  . Cough 03/14/2014  . Bladder prolapse, female, acquired 02/15/2014  . Mixed stress and urge urinary incontinence 02/15/2014  . Urethral prolapse 02/15/2014  . Dysuria 02/15/2014  . Elevated alkaline phosphatase level 01/30/2014  . Abnormal levels of other serum enzymes 01/30/2014  . Frequency of micturition 12/24/2013  . Cervicalgia 12/12/2013  .  Skin lesion of breast 12/12/2013  . Disorder of breast 12/12/2013  . Loss of vision 10/11/2013  . Vision loss, left eye 10/11/2013  . Chest pain 10/11/2013  . DOE (dyspnea on exertion) 10/11/2013  . Anxiety disorder 10/11/2013  . Other forms of  dyspnea 10/11/2013  . 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 10/11/2013  . Visual loss 10/11/2013  . Cervicogenic headache 02/28/2013  . History of frequent urinary tract infections 02/01/2013  . Hypercholesterolemia 02/01/2013  . Pulmonary hypertension ([place]) 02/01/2013  . Major depressive disorder, single episode 02/01/2013  . Sleep disorder 02/01/2013  . Personal history of urinary infection 02/01/2013  . Pure hypercholesterolemia 02/01/2013  . Other secondary pulmonary hypertension ([place]) 02/01/2013  . GERD (gastroesophageal reflux disease) 12/14/2012  . Chronic headaches 12/14/2012  . Osteoarthritis of shoulders (Bilateral) 12/14/2012  . Depression with anxiety 12/14/2012  . Dizziness 09/10/2012  . Essential hypertension, benign 09/10/2012  . Essential (primary) hypertension 09/10/2012     Past Surgical History:  Procedure Laterality Date  . ABDOMINAL HYSTERECTOMY    . ABDOMINAL HYSTERECTOMY    . APPENDECTOMY    . CHOLECYSTECTOMY    . IMAGE GUIDED SINUS SURGERY N/A 06/20/2015   Procedure: IMAGE GUIDED SINUS SURGERY;  Surgeon: Clyde Canterbury, MD;  Location: [place];  Service: ENT;  Laterality: N/A;  GAVE DISK TO CE CE  . REPLACEMENT TOTAL KNEE Left   . SPHENOIDECTOMY Bilateral 06/20/2015   Procedure: SPHENOIDECTOMY;  Surgeon: Clyde Canterbury, MD;  Location: [place];  Service: ENT;  Laterality: Bilateral;  . uterine cancer    . VULVECTOMY       Prior to Admission medications   Medication Sig Start Date End Date Taking? Authorizing Provider  acetaminophen (TYLENOL) 500 MG tablet Take 1,000 mg by mouth 2 (two) times daily.    [provider]  albuterol (VENTOLIN HFA) 108 (90 Base) MCG/ACT inhaler Inhale 2 puffs into the lungs every 3 (three) hours as needed for wheezing, cough or shortness of breath. 04/26/20   [provider]  brimonidine (ALPHAGAN P) 0.1 % SOLN Place 1 drop into both eyes 2 (two) times  daily.    [provider]  Calcium Carbonate-Vitamin D3 (CALCIUM 600-D) 600-400 MG-UNIT TABS Take 1 tablet by mouth daily.    [provider]  cholecalciferol (VITAMIN D) 25 MCG (1000 UNIT) tablet Take 1,000 Units by mouth daily. 04/17/20   [provider]  CYMBALTA 30 MG capsule Take 30 mg by mouth daily. 03/07/20   [provider]  diclofenac Sodium (VOLTAREN) 1 % GEL Apply 2 g topically every 4 (four) hours as needed (left shoulder pain).    [provider]  estradiol (ESTRACE) 0.1 MG/GM vaginal cream Place 1 Applicatorful vaginally every Monday, Wednesday, and Friday.    [provider]  gabapentin (NEURONTIN) 100 MG capsule Take 100 mg by mouth at bedtime.    [provider]  lidocaine (LIDODERM) 5 % Place 1 patch onto the skin daily as needed. Remove & Discard patch within 12 hours or as directed by MD    [provider]  melatonin 5 MG TABS Take 10 mg by mouth at bedtime.    [provider]  memantine (NAMENDA) 10 MG tablet Take 10 mg by mouth 2 (two) times daily.    [provider]  mirtazapine (REMERON SOL-TAB) 15 MG disintegrating tablet Take 15 mg by mouth at bedtime.    [provider]  Multiple Vitamins-Minerals (  PRESERVISION AREDS) TABS Take 1 tablet by mouth daily.     [provider]  omeprazole (PRILOSEC) 20 MG capsule Take 1 capsule (20 mg total) by mouth 2 (two) times daily before a meal. 12/30/19   Earleen Newport, MD  oxybutynin (OXYTROL) 3.9 MG/24HR Place 1 patch onto the skin 2 (two) times a week. (apply on Monday and Friday)    [provider]  oxyCODONE (OXY IR/ROXICODONE) 5 MG immediate release tablet Take 5 mg by mouth 2 (two) times daily.    [provider]  PROLIA 60 MG/ML SOSY injection Inject 60 mg into the skin every 6 (six) months. 03/21/20   [provider]  sertraline (ZOLOFT) 50 MG tablet Take 150 mg by mouth daily.     [provider]  SM CALCIUM 600/VITAMIN D 600-400 MG-UNIT tablet Take 1 tablet by mouth daily. 04/17/20   [provider]  traMADol (ULTRAM) 50 MG tablet Take 1 tablet (50 mg total) by mouth 2 (two) times daily. Must last 30 days. 08/26/18 11/24/18  Milinda Pointer, MD  travoprost, benzalkonium, (TRAVATAN) 0.004 % ophthalmic solution Place 1 drop into both eyes at bedtime.    [provider]  trolamine salicylate (ASPERCREME/ALOE) 10 % cream Apply 1 application topically as needed for muscle pain.    [provider]  vitamin B-12 (CYANOCOBALAMIN) 1000 MCG tablet Take 1,000 mcg by mouth daily. 04/17/20   [provider]     Allergies Ambien [zolpidem tartrate] and Codeine   Family History  Problem Relation Age of Onset  . Heart disease Mother   . Cancer Father        leukemia  . Heart disease Brother        x2  . Cancer Daughter        breast  . Kidney cancer Neg Hx   . Bladder Cancer Neg Hx     Social History Social History   Tobacco Use  . Smoking status: Never Smoker  . Smokeless tobacco: Never Used  Substance Use Topics  . Alcohol use: No    Alcohol/week: 0.0 standard drinks  . Drug use: No    Review of Systems Level 5 Caveat: Portions of the History and Physical including HPI and review of systems are unable to be completely obtained due to patient being a poor historian   Constitutional:   No known fever.  ENT:   No rhinorrhea. Cardiovascular:   No chest pain or syncope. Respiratory:   No dyspnea positive cough. Gastrointestinal:   Negative for abdominal pain, vomiting and diarrhea.  Musculoskeletal:   Positive posterior pelvic pain ____________________________________________   PHYSICAL EXAM:  VITAL SIGNS: ED Triage Vitals  Enc Vitals Group     BP 01/13/21 1512 (!) 107/55     Pulse Rate 01/13/21 1512 68     Resp 01/13/21 1512 (!) 22     Temp --      Temp src --      SpO2 01/13/21 1530 93 %     Weight 01/13/21 1515  175 lb (79.4 kg)     Height 01/13/21 1515 5\' 4"  (1.626 m)     Head Circumference --      Peak Flow --      Pain Score 01/13/21 1513 8     Pain Loc --      Pain Edu? --      Excl. in [place]? --     Vital signs reviewed, nursing assessments reviewed.  Constitutional:   Alert and oriente to self .  Ill-appearing Eyes:   Conjunctivae are normal. EOMI. PERRL. ENT      Head:   Normocephalic and atraumatic.      Nose:   No congestion/rhinnorhea.       Mouth/Throat:   Dry mucous membranes, no pharyngeal erythema. No peritonsillar mass.       Neck:   No meningismus. Full ROM.  No midline spinal tenderness Hematological/Lymphatic/Immunilogical:   No cervical lymphadenopathy. Cardiovascular:   RRR. Symmetric bilateral radial and DP pulses.  No murmurs. Respiratory:   Normal respiratory effort without tachypnea/retractions.  Diminished breath sounds at left base Gastrointestinal:   Soft and nontender. Non distended. There is no CVA tenderness.  No rebound, rigidity, or guarding.  Musculoskeletal: There is tenderness at the right hip and right knee.  Right leg appears shortened compared to left.  No open wounds.  Compartments are soft. Neurologic:   Normal speech, restricted language range Motor grossly intact. No acute focal neurologic deficits are appreciated.  Skin:    Skin is warm, dry and intact. No rash noted.  No petechiae, purpura, or bullae.  ____________________________________________    LABS (pertinent positives/negatives) (all labs ordered are listed, but only abnormal results are displayed) Labs Reviewed  COMPREHENSIVE METABOLIC PANEL - Abnormal; Notable for the following components:      Result Value   Sodium 132 (*)    Potassium 5.9 (*)    CO2 21 (*)    Glucose, Bld 126 (*)    BUN 49 (*)    Creatinine, Ser 2.00 (*)    Albumin 3.2 (*)    GFR, Estimated 23 (*)    All other components within normal limits  CBC WITH DIFFERENTIAL/PLATELET - Abnormal; Notable for the  following components:   RBC 3.35 (*)    Hemoglobin 10.7 (*)    HCT 34.3 (*)    MCV 102.4 (*)    All other components within normal limits  CK - Abnormal; Notable for the following components:   Total CK 436 (*)    All other components within normal limits  TROPONIN I (HIGH SENSITIVITY) - Abnormal; Notable for the following components:   Troponin I (High Sensitivity) 36 (*)    All other components within normal limits  RESP PANEL BY RT-PCR (FLU A&B, COVID) ARPGX2  LIPASE, BLOOD  TROPONIN I (HIGH SENSITIVITY)   ____________________________________________   EKG  Interpreted by me Normal sinus rhythm rate of 73, right axis, right bundle branch block.  No acute ischemic changes.  ____________________________________________    RADIOLOGY  DG Chest 1 View  Result Date: 01/13/2021 CLINICAL DATA:  Altered mental status.  Frequent falls. EXAM: CHEST  1 VIEW COMPARISON:  Chest x-ray dated December 30, 2019. FINDINGS: The heart size and mediastinal contours are within normal limits. Normal pulmonary vascularity. Low lung volumes with chronic left lower lobe atelectasis/scarring. No focal consolidation, pleural effusion, or pneumothorax. No acute osseous abnormality. Severe bilateral glenohumeral osteoarthritis. IMPRESSION: 1. No active disease. Electronically Signed   By: Titus Dubin M.D.   On: 01/13/2021 16:32   CT Head Wo Contrast  Result Date: 01/13/2021 CLINICAL DATA:  Head trauma with mental status change. EXAM: CT HEAD WITHOUT CONTRAST TECHNIQUE: Contiguous axial images were obtained from the base of the skull through the vertex without intravenous contrast. COMPARISON:  August 23, 2020 FINDINGS: Brain: No evidence of acute infarction, hemorrhage, hydrocephalus, extra-axial collection or mass lesion/mass effect. Vascular: No hyperdense vessel or unexpected calcification. Skull: Normal.  Negative for fracture or focal lesion. Sinuses/Orbits: No acute finding. Other: None. IMPRESSION: No  acute intracranial abnormality. Electronically Signed   By: Fidela Salisbury M.D.   On: 01/13/2021 16:39   CT Cervical Spine Wo Contrast  Result Date: 01/13/2021 CLINICAL DATA:  Neck trauma. EXAM: CT CERVICAL SPINE WITHOUT CONTRAST TECHNIQUE: Multidetector CT imaging of the cervical spine was performed without intravenous contrast. Multiplanar CT image reconstructions were also generated. COMPARISON:  July 20, 2020 FINDINGS: Alignment: Normal. Skull base and vertebrae: No acute fracture. No primary bone lesion or focal pathologic process. Soft tissues and spinal canal: No prevertebral fluid or swelling. No visible canal hematoma. Disc levels:  Multilevel osteoarthritic changes. Upper chest: Negative. Other: None. IMPRESSION: 1. No evidence of acute traumatic injury to the cervical spine. 2. Multilevel osteoarthritic changes of the cervical spine. Electronically Signed   By: Fidela Salisbury M.D.   On: 01/13/2021 16:42   DG Knee Complete 4 Views Right  Result Date: 01/13/2021 CLINICAL DATA:  Right knee pain.  Frequent falls. EXAM: RIGHT KNEE - COMPLETE 4+ VIEW COMPARISON:  Right knee x-rays dated July 14, 2018. FINDINGS: No acute fracture or dislocation. No joint effusion. Relatively unchanged bulky tricompartmental marginal osteophytes and severe patellofemoral joint space narrowing. Chondrocalcinosis again noted. Osteopenia. Soft tissues are unremarkable. IMPRESSION: 1. No acute osseous abnormality. 2. 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 Electronically Signed   By: Titus Dubin M.D.   On: 01/13/2021 16:35   DG Hip Unilat W or Wo Pelvis 2-3 Views Right  Result Date: 01/13/2021 CLINICAL DATA:  Hip pain after a fall. EXAM: DG HIP (WITH OR WITHOUT PELVIS) 2-3V RIGHT COMPARISON:  None. FINDINGS: Frontal pelvis shows diffuse bony demineralization. Degenerative changes evident in both hips. Probable old left superior and inferior pubic rami fractures. AP  and cross-table lateral views of the right hip were obtained. Cross-table lateral view is nondiagnostic due to underpenetration. No femoral neck fracture is discernible. IMPRESSION: 1. No femoral neck fracture evident although cross-table lateral view is nondiagnostic due to underpenetration. If there is high clinical index of suspicion for right femoral neck injury, consider follow-up CT or MRI to further evaluate. Electronically Signed   By: Misty Stanley M.D.   On: 01/13/2021 16:33    ____________________________________________   PROCEDURES Procedures  ____________________________________________  DIFFERENTIAL DIAGNOSIS   Intracranial hemorrhage, C-spine fracture, hip fracture, knee fracture, pelvic fracture, dehydration, electrolyte abnormality, pneumonia  CLINICAL IMPRESSION / ASSESSMENT AND PLAN / ED COURSE  Medications ordered in the ED: Medications  sodium chloride 0.9 % bolus 1,000 mL (1,000 mLs Intravenous New Bag/Given 01/13/21 1540)  morphine 2 MG/ML injection 2 mg (2 mg Intravenous Given 01/13/21 1542)  ondansetron (ZOFRAN) injection 4 mg (4 mg Intravenous Given 01/13/21 1542)    Pertinent labs & imaging results that were available during my care of the patient were reviewed by me and considered in my medical decision making (see chart for details).   [name] was evaluated in Emergency Department on 01/13/2021 for the symptoms described in the history of present illness. She was evaluated in the context of the global COVID-19 pandemic, which necessitated consideration that the patient might be at risk for infection with the SARS-CoV-2 virus that causes COVID-19. Institutional protocols and algorithms that pertain to the evaluation of patients at risk for COVID-19 are in a state of rapid change based on information released by regulatory bodies including the CDC and federal and state organizations. These policies and algorithms were followed during  the patient's care in the  ED.   Patient presents with generalized weakness, confusion, multiple falls.  Appears dehydrated.  Will need CT scan of the head and neck, x-rays of the hip knee and chest for trauma evaluation.  Not septic.  Will give IV fluids for hydration.  ----------------------------------------- 5:15 PM on 01/13/2021 -----------------------------------------  Chest x-ray viewed and interpreted by me, appears unremarkable.  Hip x-ray equivocal for fracture, CT scan obtained, images viewed by me and appear negative for fracture.  Labs show AKI with a creatinine of 2, potassium of 5.9, mildly elevated CK and troponin.  We will plan to give IV fluids and admit due to AKI, weakness, falls, altered mental status.  Clinical Course as of 01/14/21 0002  Sat Jan 13, 2021  1716 Radiology reports reviewed regarding CT head, cervical spine, hip, all unremarkable. [PS]    Clinical Course User Index [PS] Carrie Mew, MD     ____________________________________________   FINAL CLINICAL IMPRESSION(S) / ED DIAGNOSES    Final diagnoses:  Confusion  AKI (acute kidney injury) ([place])  Dehydration  Chronic dementia without behavioral disturbance [facility])     ED Discharge Orders    None      Portions of this note were generated with dragon dictation software. Dictation errors may occur despite best attempts at proofreading.   Carrie Mew, MD 01/14/21 Dyann Kief

## 2021-01-13 NOTE — ED Notes (Signed)
Pt to MRI then to [facility], MRI tech stated they would pt patient for transport.

## 2021-01-13 NOTE — ED Notes (Signed)
Assumed care of pt at 1900. Pt family at bedside. Sezure message sent to receiving RN. Pt placed for transport at this time. Pt has hx of dementia and is currently sleeping. Daughter will accompany pt up to unit to help her get settled, approved by receiving RN. Pt is blind.

## 2021-01-14 DIAGNOSIS — E875 Hyperkalemia: Secondary | ICD-10-CM | POA: Diagnosis not present

## 2021-01-14 DIAGNOSIS — H3530 Unspecified macular degeneration: Secondary | ICD-10-CM | POA: Diagnosis present

## 2021-01-14 DIAGNOSIS — G9341 Metabolic encephalopathy: Secondary | ICD-10-CM | POA: Diagnosis present

## 2021-01-14 DIAGNOSIS — M542 Cervicalgia: Secondary | ICD-10-CM | POA: Diagnosis present

## 2021-01-14 DIAGNOSIS — G479 Sleep disorder, unspecified: Secondary | ICD-10-CM | POA: Diagnosis present

## 2021-01-14 DIAGNOSIS — Z96652 Presence of left artificial knee joint: Secondary | ICD-10-CM | POA: Diagnosis present

## 2021-01-14 DIAGNOSIS — R296 Repeated falls: Secondary | ICD-10-CM | POA: Diagnosis present

## 2021-01-14 DIAGNOSIS — I5032 Chronic diastolic (congestive) heart failure: Secondary | ICD-10-CM | POA: Diagnosis present

## 2021-01-14 DIAGNOSIS — N3946 Mixed incontinence: Secondary | ICD-10-CM | POA: Diagnosis present

## 2021-01-14 DIAGNOSIS — F0390 Unspecified dementia without behavioral disturbance: Secondary | ICD-10-CM | POA: Diagnosis present

## 2021-01-14 DIAGNOSIS — G894 Chronic pain syndrome: Secondary | ICD-10-CM | POA: Diagnosis present

## 2021-01-14 DIAGNOSIS — F05 Delirium due to known physiological condition: Secondary | ICD-10-CM | POA: Diagnosis present

## 2021-01-14 DIAGNOSIS — E860 Dehydration: Secondary | ICD-10-CM | POA: Diagnosis present

## 2021-01-14 DIAGNOSIS — Z20822 Contact with and (suspected) exposure to covid-19: Secondary | ICD-10-CM | POA: Diagnosis present

## 2021-01-14 DIAGNOSIS — K219 Gastro-esophageal reflux disease without esophagitis: Secondary | ICD-10-CM | POA: Diagnosis present

## 2021-01-14 DIAGNOSIS — I110 Hypertensive heart disease with heart failure: Secondary | ICD-10-CM | POA: Diagnosis present

## 2021-01-14 DIAGNOSIS — N179 Acute kidney failure, unspecified: Secondary | ICD-10-CM | POA: Diagnosis present

## 2021-01-14 DIAGNOSIS — G4730 Sleep apnea, unspecified: Secondary | ICD-10-CM | POA: Diagnosis present

## 2021-01-14 DIAGNOSIS — M6282 Rhabdomyolysis: Secondary | ICD-10-CM | POA: Diagnosis present

## 2021-01-14 DIAGNOSIS — I5031 Acute diastolic (congestive) heart failure: Secondary | ICD-10-CM | POA: Diagnosis not present

## 2021-01-14 DIAGNOSIS — F418 Other specified anxiety disorders: Secondary | ICD-10-CM | POA: Diagnosis present

## 2021-01-14 DIAGNOSIS — I4510 Unspecified right bundle-branch block: Secondary | ICD-10-CM | POA: Diagnosis present

## 2021-01-14 DIAGNOSIS — E7800 Pure hypercholesterolemia, unspecified: Secondary | ICD-10-CM | POA: Diagnosis present

## 2021-01-14 DIAGNOSIS — H409 Unspecified glaucoma: Secondary | ICD-10-CM | POA: Diagnosis present

## 2021-01-14 DIAGNOSIS — N178 Other acute kidney failure: Secondary | ICD-10-CM | POA: Diagnosis not present

## 2021-01-14 DIAGNOSIS — Z66 Do not resuscitate: Secondary | ICD-10-CM | POA: Diagnosis present

## 2021-01-14 DIAGNOSIS — E785 Hyperlipidemia, unspecified: Secondary | ICD-10-CM | POA: Diagnosis present

## 2021-01-14 LAB — COMPREHENSIVE METABOLIC PANEL
ALT: 17 U/L (ref 0–44)
AST: 23 U/L (ref 15–41)
Albumin: 2.8 g/dL — ABNORMAL LOW (ref 3.5–5.0)
Alkaline Phosphatase: 85 U/L (ref 38–126)
Anion gap: 5 (ref 5–15)
BUN: 43 mg/dL — ABNORMAL HIGH (ref 8–23)
CO2: 21 mmol/L — ABNORMAL LOW (ref 22–32)
Calcium: 8.1 mg/dL — ABNORMAL LOW (ref 8.9–10.3)
Chloride: 112 mmol/L — ABNORMAL HIGH (ref 98–111)
Creatinine, Ser: 1.73 mg/dL — ABNORMAL HIGH (ref 0.44–1.00)
GFR, Estimated: 27 mL/min — ABNORMAL LOW (ref >60)
Glucose, Bld: 95 mg/dL (ref 70–99)
Potassium: 5.4 mmol/L — ABNORMAL HIGH (ref 3.5–5.1)
Sodium: 138 mmol/L (ref 135–145)
Total Bilirubin: 0.7 mg/dL (ref 0.3–1.2)
Total Protein: 5.9 g/dL — ABNORMAL LOW (ref 6.5–8.1)

## 2021-01-14 LAB — GLUCOSE, CAPILLARY
Glucose-Capillary: 156 mg/dL — ABNORMAL HIGH (ref 70–99)
Glucose-Capillary: 97 mg/dL (ref 70–99)

## 2021-01-14 LAB — CBC
HCT: 32.1 % — ABNORMAL LOW (ref 36.0–46.0)
Hemoglobin: 9.9 g/dL — ABNORMAL LOW (ref 12.0–15.0)
MCH: 32.2 pg (ref 26.0–34.0)
MCHC: 30.8 g/dL (ref 30.0–36.0)
MCV: 104.6 fL — ABNORMAL HIGH (ref 80.0–100.0)
Platelets: 215 10*3/uL (ref 150–400)
RBC: 3.07 MIL/uL — ABNORMAL LOW (ref 3.87–5.11)
RDW: 12.8 % (ref 11.5–15.5)
WBC: 6.8 10*3/uL (ref 4.0–10.5)
nRBC: 0 % (ref 0.0–0.2)

## 2021-01-14 LAB — VITAMIN B12: Vitamin B-12: 2389 pg/mL — ABNORMAL HIGH (ref 180–914)

## 2021-01-14 LAB — CK: Total CK: 308 U/L — ABNORMAL HIGH (ref 38–234)

## 2021-01-14 MED ORDER — LACTULOSE 10 GM/15ML PO SOLN
20.0000 g | Freq: Once | ORAL | Status: AC
  Administered 2021-01-14: 14:00:00 20 g via ORAL
  Filled 2021-01-14: qty 30

## 2021-01-14 MED ORDER — ONDANSETRON HCL 4 MG/2ML IJ SOLN
4.0000 mg | Freq: Four times a day (QID) | INTRAMUSCULAR | Status: DC | PRN
  Administered 2021-01-14: 4 mg via INTRAVENOUS
  Filled 2021-01-14: qty 2

## 2021-01-14 MED ORDER — SODIUM POLYSTYRENE SULFONATE 15 GM/60ML PO SUSP
45.0000 g | Freq: Once | ORAL | Status: AC
  Administered 2021-01-14: 09:00:00 45 g via ORAL
  Filled 2021-01-14: qty 180

## 2021-01-14 MED ORDER — FOLIC ACID 1 MG PO TABS
1.0000 mg | ORAL_TABLET | Freq: Every day | ORAL | Status: DC
  Administered 2021-01-14 – 2021-01-17 (×4): 1 mg via ORAL
  Filled 2021-01-14 (×5): qty 1

## 2021-01-14 NOTE — Progress Notes (Signed)
PT Cancellation Note  Patient Details Name: [name] MRN: [id number] DOB: August 17, 1926   Cancelled Treatment:    Reason Eval/Treat Not Completed: Patient not medically ready PT orders received, chart reviewed. Pt noted to have critically high K+ (5.4); contraindicated for exertional activity at this time. Will continue to follow and initiate services as pt medically appropriate to participate in therapy.   Lavone Nian, PT, DPT 01/14/21, 9:58 AM    [name] 01/14/2021, 9:58 AM

## 2021-01-14 NOTE — Progress Notes (Signed)
PROGRESS NOTE    [name]  IOM:[id number] DOB: 16-Mar-1926 DOA: 01/13/2021 PCP: [facility]   Chief complaint.  Altered mental status Brief Narrative:  [name] is a 87 y.o. female with medical history significant of dementia, anxiety, depression, cervical spine disease, chronic headaches, chronic pain, osteoarthritis, hypertension, chronic falls, GERD, hyperlipidemia, decreased vision, urinary incontinence, pulmonary hypertension, uterine cancer status post hysterectomy, right bundle branch block, wears dentures who presents with altered mental status and history of frequent falls. Patient has not been eating well, she has no bowel movement for the last 4 days, she does not drink  water.  By time she came to the hospital she was  found to have dehydration, acute renal failure and hyperkalemia.   Assessment & Plan:   Principal Problem:   Acute metabolic encephalopathy Active Problems:   Essential hypertension, benign   Loss of vision   Cervicalgia   Frequent falls   GERD (gastroesophageal reflux disease)   Dementia (HCC)   Mixed stress and urge urinary incontinence   Sleep disorder   Anxiety disorder   Depression with anxiety   Visual loss   Chronic pain syndrome   Hyperkalemia   AKI (acute kidney injury) ([place])   Rhabdomyolysis   Macrocytic anemia  #1.  Acute metabolic encephalopathy secondary to dehydration  Baseline dementia  Acute kidney injury Hyperkalemia. Mild rhabdomyolysis. Anorexia Severe constipation Patient is still confused.  He has significant dehydration due to poor p.o. intake. She also developed significant hyperkalemia.  He received no fluids, her potassium still elevated 5.4. I will continue IV fluids, I will give Kayexalate. Additionally, I will give her her lactulose for severe constipation. Recheck lab tomorrow.  2.  Macrocytic anemia. B12 level normal.  3.  Frequent falls Continue PT/OT.  #4  pure essential hypertension. Pulmonary hypertension. Continue to follow, continue home medicine.  #5.  Cough with dysphagia. Obtain speech therapy evaluation.     DVT prophylaxis: Lovenox Code Status: DNR Family Communication: Daughter updated Disposition Plan:  .   Status is: Observation  The patient will require care spanning > 2 midnights and should be moved to inpatient because: Inpatient level of care appropriate due to severity of illness  Dispo: The patient is from: Home              Anticipated d/c is to: pending              Patient currently is not medically stable to d/c.   Difficult to place patient No        I/O last 3 completed shifts: In: 2682.6 [I.V.:1182.6; IV Piggyback:1500] Out: 250 [Urine:250] No intake/output data recorded.     Consultants:   None  Procedures: None  Antimicrobials: None  Subjective: Patient does not feel well today, feels tired.  She has a cough, she has some dysphagia.  She does not have any short of breath Denies any abdominal pain or nausea vomiting.  She is severely constipated. No fever chills  No dysuria hematuria. No chest pain or palpitation. No headache or dizziness.  Objective: Vitals:   01/14/21 0110 01/14/21 0248 01/14/21 0508 01/14/21 0804  BP: (!) 102/55  (!) 130/54 135/69  Pulse: 61  70 73  Resp: 18  11 15   Temp: 98 F (36.7 C)  97.9 F (36.6 C) 97.9 F (36.6 C)  TempSrc: Axillary  Axillary Oral  SpO2: 99%  94% (!) 88%  Weight:  82.5 kg  Height:        Intake/Output Summary (Last 24 hours) at 01/14/2021 1311 Last data filed at 01/14/2021 0508 Gross per 24 hour  Intake 2682.63 ml  Output 250 ml  Net 2432.63 ml   Filed Weights   01/13/21 1515 01/13/21 2100 01/14/21 0248  Weight: 79.4 kg 82.4 kg 82.5 kg    Examination:  General exam: Appears calm and comfortable  Respiratory system: Clear to auscultation. Respiratory effort normal. Cardiovascular system: S1 & S2 heard, RRR. No JVD,  murmurs, rubs, gallops or clicks. No pedal edema. Gastrointestinal system: Abdomen is nondistended, soft and nontender. No organomegaly or masses felt. Normal bowel sounds heard. Central nervous system: Alert and oriented x2. No focal neurological deficits. Extremities: Symmetric 5 x 5 power. Skin: No rashes, lesions or ulcers Psychiatry:  Mood & affect appropriate.     Data Reviewed: I have personally reviewed following labs and imaging studies  CBC: Recent Labs  Lab 01/13/21 1530 01/14/21 0417  WBC 10.1 6.8  NEUTROABS 7.4  --   HGB 10.7* 9.9*  HCT 34.3* 32.1*  MCV 102.4* 104.6*  PLT 254 829   Basic Metabolic Panel: Recent Labs  Lab 01/13/21 1530 01/13/21 2215 01/14/21 0417  NA 132*  --  138  K 5.9* 5.5* 5.4*  CL 105  --  112*  CO2 21*  --  21*  GLUCOSE 126*  --  95  BUN 49*  --  43*  CREATININE 2.00*  --  1.73*  CALCIUM 8.9  --  8.1*  MG  --  2.1  --    GFR: Estimated Creatinine Clearance: 21.6 mL/min (A) (by C-G formula based on SCr of 1.73 mg/dL (H)). Liver Function Tests: Recent Labs  Lab 01/13/21 1530 01/14/21 0417  AST 26 23  ALT 18 17  ALKPHOS 95 85  BILITOT 1.0 0.7  PROT 6.5 5.9*  ALBUMIN 3.2* 2.8*   Recent Labs  Lab 01/13/21 1530  LIPASE 19   No results for input(s): AMMONIA in the last 168 hours. Coagulation Profile: No results for input(s): INR, PROTIME in the last 168 hours. Cardiac Enzymes: Recent Labs  Lab 01/13/21 1530 01/14/21 0417  CKTOTAL 436* 308*   BNP (last 3 results) No results for input(s): PROBNP in the last 8760 hours. HbA1C: No results for input(s): HGBA1C in the last 72 hours. CBG: No results for input(s): GLUCAP in the last 168 hours. Lipid Profile: No results for input(s): CHOL, HDL, LDLCALC, TRIG, CHOLHDL, LDLDIRECT in the last 72 hours. Thyroid Function Tests: Recent Labs    01/13/21 2215  TSH 2.282   Anemia Panel: Recent Labs    01/13/21 1530 01/13/21 2215  VITAMINB12 2,389*  --   FOLATE  --  4.7*    Sepsis Labs: No results for input(s): PROCALCITON, LATICACIDVEN in the last 168 hours.  Recent Results (from the past 240 hour(s))  Resp Panel by RT-PCR (Flu A&B, Covid) Nasopharyngeal Swab     Status: None   Collection Time: 01/13/21  4:41 PM   Specimen: Nasopharyngeal Swab; Nasopharyngeal(NP) swabs in vial transport medium  Result Value Ref Range Status   SARS Coronavirus 2 by RT PCR NEGATIVE NEGATIVE Final    Comment: (NOTE) SARS-CoV-2 target nucleic acids are NOT DETECTED.  The SARS-CoV-2 RNA is generally detectable in upper respiratory specimens during the acute phase of infection. The lowest concentration of SARS-CoV-2 viral copies this assay can detect is 138 copies/mL. A negative result does not preclude SARS-Cov-2 infection and should not be used  as the sole basis for treatment or other patient management decisions. A negative result may occur with  improper specimen collection/handling, submission of specimen other than nasopharyngeal swab, presence of viral mutation(s) within the areas targeted by this assay, and inadequate number of viral copies(<138 copies/mL). A negative result must be combined with clinical observations, patient history, and epidemiological information. The expected result is Negative.  Fact Sheet for Patients:  EntrepreneurPulse.com.au  Fact Sheet for Healthcare Providers:  IncredibleEmployment.be  This test is no t yet approved or cleared by the [place] FDA and  has been authorized for detection and/or diagnosis of SARS-CoV-2 by FDA under an Emergency Use Authorization (EUA). This EUA will remain  in effect (meaning this test can be used) for the duration of the COVID-19 declaration under Section 564(b)(1) of the Act, 21 U.S.C.section 360bbb-3(b)(1), unless the authorization is terminated  or revoked sooner.       Influenza A by PCR NEGATIVE NEGATIVE Final   Influenza B by PCR NEGATIVE NEGATIVE  Final    Comment: (NOTE) The Xpert Xpress SARS-CoV-2/FLU/RSV plus assay is intended as an aid in the diagnosis of influenza from Nasopharyngeal swab specimens and should not be used as a sole basis for treatment. Nasal washings and aspirates are unacceptable for Xpert Xpress SARS-CoV-2/FLU/RSV testing.  Fact Sheet for Patients: EntrepreneurPulse.com.au  Fact Sheet for Healthcare Providers: IncredibleEmployment.be  This test is not yet approved or cleared by the [place] FDA and has been authorized for detection and/or diagnosis of SARS-CoV-2 by FDA under an Emergency Use Authorization (EUA). This EUA will remain in effect (meaning this test can be used) for the duration of the COVID-19 declaration under Section 564(b)(1) of the Act, 21 U.S.C. section 360bbb-3(b)(1), unless the authorization is terminated or revoked.  Performed at [facility], [address], [place], [place] [address]          Radiology Studies: DG Chest 1 View  Result Date: 01/13/2021 CLINICAL DATA:  Altered mental status.  Frequent falls. EXAM: CHEST  1 VIEW COMPARISON:  Chest x-ray dated December 30, 2019. FINDINGS: The heart size and mediastinal contours are within normal limits. Normal pulmonary vascularity. Low lung volumes with chronic left lower lobe atelectasis/scarring. No focal consolidation, pleural effusion, or pneumothorax. No acute osseous abnormality. Severe bilateral glenohumeral osteoarthritis. IMPRESSION: 1. No active disease. Electronically Signed   By: Titus Dubin M.D.   On: 01/13/2021 16:32   CT Head Wo Contrast  Result Date: 01/13/2021 CLINICAL DATA:  Head trauma with mental status change. EXAM: CT HEAD WITHOUT CONTRAST TECHNIQUE: Contiguous axial images were obtained from the base of the skull through the vertex without intravenous contrast. COMPARISON:  August 23, 2020 FINDINGS: Brain: No evidence of acute infarction, hemorrhage,  hydrocephalus, extra-axial collection or mass lesion/mass effect. Vascular: No hyperdense vessel or unexpected calcification. Skull: Normal. Negative for fracture or focal lesion. Sinuses/Orbits: No acute finding. Other: None. IMPRESSION: No acute intracranial abnormality. Electronically Signed   By: Fidela Salisbury M.D.   On: 01/13/2021 16:39   CT Cervical Spine Wo Contrast  Result Date: 01/13/2021 CLINICAL DATA:  Neck trauma. EXAM: CT CERVICAL SPINE WITHOUT CONTRAST TECHNIQUE: Multidetector CT imaging of the cervical spine was performed without intravenous contrast. Multiplanar CT image reconstructions were also generated. COMPARISON:  July 20, 2020 FINDINGS: Alignment: Normal. Skull base and vertebrae: No acute fracture. No primary bone lesion or focal pathologic process. Soft tissues and spinal canal: No prevertebral fluid or swelling. No visible canal hematoma. Disc levels:  Multilevel  osteoarthritic changes. Upper chest: Negative. Other: None. IMPRESSION: 1. No evidence of acute traumatic injury to the cervical spine. 2. Multilevel osteoarthritic changes of the cervical spine. Electronically Signed   By: Fidela Salisbury M.D.   On: 01/13/2021 16:42   MR BRAIN WO CONTRAST  Result Date: 01/13/2021 CLINICAL DATA:  Encephalopathy EXAM: MRI HEAD WITHOUT CONTRAST TECHNIQUE: Multiplanar, multiecho pulse sequences of the brain and surrounding structures were obtained without intravenous contrast. COMPARISON:  03/02/2016 FINDINGS: Brain: No acute infarct, mass effect or extra-axial collection. Focus of chronic hemorrhage in the inferior left temporal lobe. There is multifocal hyperintense T2-weighted signal within the white matter. Generalized volume loss without a clear lobar predilection. The midline structures are normal. Vascular: Major flow voids are preserved. Skull and upper cervical spine: Normal calvarium and skull base. Visualized upper cervical spine and soft tissues are normal.  Sinuses/Orbits:No paranasal sinus fluid levels or advanced mucosal thickening. No mastoid or middle ear effusion. Normal orbits. IMPRESSION: 1. No acute intracranial abnormality. 2. Generalized volume loss and findings of chronic microvascular ischemia. Electronically Signed   By: Ulyses Jarred M.D.   On: 01/13/2021 21:04   CT Hip Right Wo Contrast  Result Date: 01/13/2021 CLINICAL DATA:  Right hip pain.  Frequent falls. EXAM: CT OF THE RIGHT HIP WITHOUT CONTRAST TECHNIQUE: Multidetector CT imaging of the right hip was performed according to the standard protocol. Multiplanar CT image reconstructions were also generated. COMPARISON:  Right hip x-rays from same day. FINDINGS: Bones/Joint/Cartilage No fracture or dislocation. Mild right hip osteoarthritis with chondrocalcinosis. Moderate degenerative changes of the pubic symphysis. No joint effusion. Ligaments Ligaments are suboptimally evaluated by CT. Muscles and Tendons Grossly intact.  Gluteus minimus and medius muscle atrophy. Soft tissue Small amount of subcutaneous edema overlying the right hip laterally. No fluid collection or hematoma. No soft tissue mass. IMPRESSION: 1. No acute osseous abnormality. 2. Mild right hip osteoarthritis. Electronically Signed   By: Titus Dubin M.D.   On: 01/13/2021 17:37   DG Knee Complete 4 Views Right  Result Date: 01/13/2021 CLINICAL DATA:  Right knee pain.  Frequent falls. EXAM: RIGHT KNEE - COMPLETE 4+ VIEW COMPARISON:  Right knee x-rays dated July 14, 2018. FINDINGS: No acute fracture or dislocation. No joint effusion. Relatively unchanged bulky tricompartmental marginal osteophytes and severe patellofemoral joint space narrowing. Chondrocalcinosis again noted. Osteopenia. Soft tissues are unremarkable. IMPRESSION: 1. No acute osseous abnormality. 2. 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 Electronically Signed   By: Titus Dubin M.D.   On: 01/13/2021 16:35   DG Hip  Unilat W or Wo Pelvis 2-3 Views Right  Result Date: 01/13/2021 CLINICAL DATA:  Hip pain after a fall. EXAM: DG HIP (WITH OR WITHOUT PELVIS) 2-3V RIGHT COMPARISON:  None. FINDINGS: Frontal pelvis shows diffuse bony demineralization. Degenerative changes evident in both hips. Probable old left superior and inferior pubic rami fractures. AP and cross-table lateral views of the right hip were obtained. Cross-table lateral view is nondiagnostic due to underpenetration. No femoral neck fracture is discernible. IMPRESSION: 1. No femoral neck fracture evident although cross-table lateral view is nondiagnostic due to underpenetration. If there is high clinical index of suspicion for right femoral neck injury, consider follow-up CT or MRI to further evaluate. Electronically Signed   By: Misty Stanley M.D.   On: 01/13/2021 16:33        Scheduled Meds: . brimonidine  1 drop Both Eyes BID  . calcium-vitamin D  1 tablet Oral Daily  . enoxaparin (LOVENOX)  injection  30 mg Subcutaneous Q24H  . gabapentin  100 mg Oral QHS  . melatonin  10 mg Oral QHS  . memantine  10 mg Oral BID  . mirtazapine  15 mg Oral QHS  . pantoprazole  40 mg Oral Daily  . sertraline  150 mg Oral Daily  . sodium chloride flush  3 mL Intravenous Q12H  . Travoprost (BAK Free)  1 drop Both Eyes QHS  . vitamin B-12  1,000 mcg Oral Daily   Continuous Infusions: . sodium chloride 125 mL/hr at 01/14/21 1109     LOS: 0 days    Time spent: 32 minutes, more than 50% time involved in direct patient care.    Sharen Hones, MD [facility]   To contact the attending provider between 7A-7P or the covering provider during after hours 7P-7A, please log into the web site [email or web address] and access using universal [place] password for that web site. If you do not have the password, please call the hospital operator.  01/14/2021, 1:11 PM

## 2021-01-14 NOTE — Progress Notes (Signed)
OT Cancellation Note  Patient Details Name: [name] MRN: [id number] DOB: [birth year]   Cancelled Treatment:    Reason Eval/Treat Not Completed: Patient not medically ready. Chart reviewed - pt noted to have K+ critically high at 5.4; contraindicated for exertional activity at this time. Will continue to follow and initiate services as pt medically appropriate to participate in therapy.   Fredirick Maudlin, OTR/L [place]

## 2021-01-14 NOTE — Plan of Care (Addendum)
Shift Summary: Pt admitted from ED overnight. Unable to complete admission documentation d/t mental status. Oriented to self, SB/NSR on tele, remains on 1L [place], BP soft but within call parameters. NS infusing per MAR, adequate UO. Q2hr turns maintained for skin protection. Unable to perform bedside swallow d/t pt being drowsy s/p morphine in ED; mouth swabbed with water. Fall/safety precautions in place, rounding performed, needs/concerns addressed during shift.   0600 - Pt much more alert, able to perform swallow screen. Tolerated sips well w/o coughing afterwards.  Problem: Education: Goal: Knowledge of General Education information will improve Description: Including pain rating scale, medication(s)/side effects and non-pharmacologic comfort measures 01/14/2021 0459 by Jocelyn Lamer, RN Outcome: Progressing 01/13/2021 2218 by Jocelyn Lamer, RN Outcome: Progressing   Problem: Health Behavior/Discharge Planning: Goal: Ability to manage health-related needs will improve 01/14/2021 0459 by Jocelyn Lamer, RN Outcome: Progressing 01/13/2021 2218 by Jocelyn Lamer, RN Outcome: Progressing   Problem: Clinical Measurements: Goal: Ability to maintain clinical measurements within normal limits will improve 01/14/2021 0459 by Jocelyn Lamer, RN Outcome: Progressing 01/13/2021 2218 by Jocelyn Lamer, RN Outcome: Progressing Goal: Will remain free from infection 01/14/2021 0459 by Jocelyn Lamer, RN Outcome: Progressing 01/13/2021 2218 by Jocelyn Lamer, RN Outcome: Progressing Goal: Diagnostic test results will improve 01/14/2021 0459 by Jocelyn Lamer, RN Outcome: Progressing 01/13/2021 2218 by Jocelyn Lamer, RN Outcome: Progressing Goal: Respiratory complications will improve 01/14/2021 0459 by Jocelyn Lamer, RN Outcome: Progressing 01/13/2021 2218 by Jocelyn Lamer, RN Outcome: Progressing Goal: Cardiovascular complication will be avoided 01/14/2021 0459 by Jocelyn Lamer,  RN Outcome: Progressing 01/13/2021 2218 by Jocelyn Lamer, RN Outcome: Progressing   Problem: Activity: Goal: Risk for activity intolerance will decrease 01/14/2021 0459 by Jocelyn Lamer, RN Outcome: Progressing 01/13/2021 2218 by Jocelyn Lamer, RN Outcome: Progressing   Problem: Nutrition: Goal: Adequate nutrition will be maintained 01/14/2021 0459 by Jocelyn Lamer, RN Outcome: Progressing 01/13/2021 2218 by Jocelyn Lamer, RN Outcome: Progressing   Problem: Elimination: Goal: Will not experience complications related to bowel motility 01/14/2021 0459 by Jocelyn Lamer, RN Outcome: Progressing 01/13/2021 2218 by Jocelyn Lamer, RN Outcome: Progressing Goal: Will not experience complications related to urinary retention 01/14/2021 0459 by Jocelyn Lamer, RN Outcome: Progressing 01/13/2021 2218 by Jocelyn Lamer, RN Outcome: Progressing   Problem: Pain Managment: Goal: General experience of comfort will improve 01/14/2021 0459 by Jocelyn Lamer, RN Outcome: Progressing 01/13/2021 2218 by Jocelyn Lamer, RN Outcome: Progressing   Problem: Safety: Goal: Ability to remain free from injury will improve 01/14/2021 0459 by Jocelyn Lamer, RN Outcome: Progressing 01/13/2021 2218 by Jocelyn Lamer, RN Outcome: Progressing   Problem: Skin Integrity: Goal: Risk for impaired skin integrity will decrease 01/14/2021 0459 by Jocelyn Lamer, RN Outcome: Progressing 01/13/2021 2218 by Jocelyn Lamer, RN Outcome: Progressing

## 2021-01-15 LAB — BASIC METABOLIC PANEL
Anion gap: 6 (ref 5–15)
BUN: 24 mg/dL — ABNORMAL HIGH (ref 8–23)
CO2: 23 mmol/L (ref 22–32)
Calcium: 7.7 mg/dL — ABNORMAL LOW (ref 8.9–10.3)
Chloride: 113 mmol/L — ABNORMAL HIGH (ref 98–111)
Creatinine, Ser: 1.02 mg/dL — ABNORMAL HIGH (ref 0.44–1.00)
GFR, Estimated: 52 mL/min — ABNORMAL LOW (ref >60)
Glucose, Bld: 104 mg/dL — ABNORMAL HIGH (ref 70–99)
Potassium: 4.6 mmol/L (ref 3.5–5.1)
Sodium: 142 mmol/L (ref 135–145)

## 2021-01-15 LAB — MAGNESIUM: Magnesium: 2.1 mg/dL (ref 1.7–2.4)

## 2021-01-15 LAB — CBC WITH DIFFERENTIAL/PLATELET
Abs Immature Granulocytes: 0.04 10*3/uL (ref 0.00–0.07)
Basophils Absolute: 0 10*3/uL (ref 0.0–0.1)
Basophils Relative: 1 %
Eosinophils Absolute: 0.2 10*3/uL (ref 0.0–0.5)
Eosinophils Relative: 3 %
HCT: 31.8 % — ABNORMAL LOW (ref 36.0–46.0)
Hemoglobin: 9.9 g/dL — ABNORMAL LOW (ref 12.0–15.0)
Immature Granulocytes: 1 %
Lymphocytes Relative: 18 %
Lymphs Abs: 1.3 10*3/uL (ref 0.7–4.0)
MCH: 32 pg (ref 26.0–34.0)
MCHC: 31.1 g/dL (ref 30.0–36.0)
MCV: 102.9 fL — ABNORMAL HIGH (ref 80.0–100.0)
Monocytes Absolute: 0.6 10*3/uL (ref 0.1–1.0)
Monocytes Relative: 9 %
Neutro Abs: 5 10*3/uL (ref 1.7–7.7)
Neutrophils Relative %: 68 %
Platelets: 220 10*3/uL (ref 150–400)
RBC: 3.09 MIL/uL — ABNORMAL LOW (ref 3.87–5.11)
RDW: 12.8 % (ref 11.5–15.5)
WBC: 7.2 10*3/uL (ref 4.0–10.5)
nRBC: 0 % (ref 0.0–0.2)

## 2021-01-15 LAB — URINE CULTURE: Culture: NO GROWTH

## 2021-01-15 LAB — TROPONIN I (HIGH SENSITIVITY): Troponin I (High Sensitivity): 36 ng/L — ABNORMAL HIGH (ref <18)

## 2021-01-15 MED ORDER — PROSOURCE PLUS PO LIQD
30.0000 mL | Freq: Three times a day (TID) | ORAL | Status: DC
  Administered 2021-01-15 – 2021-01-16 (×4): 30 mL via ORAL
  Filled 2021-01-15 (×7): qty 30

## 2021-01-15 MED ORDER — FUROSEMIDE 10 MG/ML IJ SOLN
20.0000 mg | Freq: Once | INTRAMUSCULAR | Status: AC
  Administered 2021-01-15: 10:00:00 20 mg via INTRAVENOUS
  Filled 2021-01-15: qty 2

## 2021-01-15 MED ORDER — FUROSEMIDE 40 MG PO TABS
40.0000 mg | ORAL_TABLET | Freq: Every day | ORAL | Status: DC
  Administered 2021-01-16: 09:00:00 40 mg via ORAL
  Filled 2021-01-15: qty 1

## 2021-01-15 MED ORDER — ADULT MULTIVITAMIN W/MINERALS CH
1.0000 | ORAL_TABLET | Freq: Every day | ORAL | Status: DC
  Administered 2021-01-16 – 2021-01-17 (×2): 1 via ORAL
  Filled 2021-01-15 (×2): qty 1

## 2021-01-15 MED ORDER — ENOXAPARIN SODIUM 40 MG/0.4ML ~~LOC~~ SOLN
0.5000 mg/kg | SUBCUTANEOUS | Status: DC
  Administered 2021-01-15 – 2021-01-16 (×2): 42.5 mg via SUBCUTANEOUS
  Filled 2021-01-15 (×2): qty 0.8

## 2021-01-15 NOTE — Evaluation (Signed)
Occupational Therapy Evaluation Patient Details Name: Keylani Perlstein MRN: [id number] DOB: 1926/09/10 Today's Date: 01/15/2021    History of Present Illness 87 y.o. female with medical history significant of dementia, anxiety, depression, cervical spine disease, chronic headaches, chronic pain, osteoarthritis, hypertension, chronic falls, GERD, hyperlipidemia, decreased vision, urinary incontinence, pulmonary hypertension, uterine cancer status post hysterectomy, right bundle branch block, wears dentures who presents with altered mental status and history of frequent falls. She was  found to have dehydration, acute renal failure and hyperkalemia.   Clinical Impression   Pt seen for OT evaluation this date. Upon arrival to room, pt was awake with daughter and spouse at bedside. Pt A&Ox1, with family reporting that pt is typically more oriented at baseline. PLOF obtained from conversation with family at bedside. Prior to admission, pt received assistance during all ADLs. At baseline, pt was able to engage b/l LE during sit<>stand transfers via mechanical lift to/from BSC/wheelchair and was able to sit in a wheelchair while engaging in activities at a [facility] facility. This date, pt required SUPERVISION/SET-UP ASSIST to wash hands and face with washcloth at bed-level and MAX A for bed-level LB dressing. Pt currently presents with impaired cognition and decreased strength and balance. Family is hoping that pt will be able to regain trunk/LE strength in to be able to return to participating in sit<>stand mechanical lifts and seated ADLs. Pt would benefit from additional skilled OT services to maximize return to PLOF and prevent further deconditioning. Upon discharge, recommend SNF.     Follow Up Recommendations  SNF    Equipment Recommendations  None recommended by OT       Precautions / Restrictions Precautions Precautions: Fall      Mobility Bed Mobility Overal bed mobility: Needs  Assistance Bed Mobility: Rolling           General bed mobility comments: Family reporting pt has been unable to participate in rolling for bedpan placement                         ADL either performed or assessed with clinical judgement   ADL Overall ADL's : Needs assistance/impaired     Grooming: Wash/dry hands;Wash/dry face;Supervision/safety;Set up;Bed level               Lower Body Dressing: Maximal assistance;Bed level Lower Body Dressing Details (indicate cue type and reason): to don socks                     Vision Baseline Vision/History: Cataracts;Macular Degeneration              Pertinent Vitals/Pain Pain Assessment: Faces Faces Pain Scale: Hurts little more Pain Location: b/l shoulders with movement Pain Descriptors / Indicators: Discomfort Pain Intervention(s): Limited activity within patient's tolerance;Monitored during session        Extremity/Trunk Assessment Upper Extremity Assessment Upper Extremity Assessment: Generalized weakness (Grossly 3/5 grip strength and elbow flexion/extension. Unable to flex shoulders beyond ~20 degrees d/t osteoarthritis)   Lower Extremity Assessment Lower Extremity Assessment: Generalized weakness          Cognition Arousal/Alertness: Awake/alert Behavior During Therapy: Restless Overall Cognitive Status: Impaired/Different from baseline                                 General Comments: Pt A&Ox1. Family reports that pt is typically more oriented      Exercises General Exercises -  Lower Extremity Heel Slides: Both;5 reps;Supine Hip ABduction/ADduction: Both;5 reps;Supine Other Exercises Other Exercises: Educated pt and family on role of OT, POC, and discharge recommendations        Home Living Family/patient expects to be discharged to:: Private residence Living Arrangements: Children;Spouse/significant other Available Help at Discharge: Family;Available 24  hours/day;Personal care attendant;Available PRN/intermittently (Lives with spouse. PCA comes in morning to assist with ADLs. Pt spends day at a [facility]ACE facility. Daughters available intermittently) Type of Home: House Home Access: Ramped entrance     Home Layout: One level               Home Equipment: Bedside commode;Wheelchair - manual;Hospital bed;Other (comment) (Sit>stand hoyer lift)          Prior Functioning/Environment Level of Independence: Needs assistance  Gait / Transfers Assistance Needed: Uses sit>stand hoyer lift for transfers to/from [facility] and wheelchair ADL's / Homemaking Assistance Needed: Requires assist for all ADLs            OT Problem List: Decreased strength;Decreased activity tolerance;Impaired balance (sitting and/or standing);Decreased cognition;Decreased safety awareness      OT Treatment/Interventions: Self-care/ADL training;Patient/family education    OT Goals(Current goals can be found in the care plan section) Acute Rehab OT Goals Patient Stated Goal: to use mechanical lift for transfers OT Goal Formulation: With family Time For Goal Achievement: 01/29/21 Potential to Achieve Goals: Fair ADL Goals Pt Will Perform Grooming: with set-up;with supervision;sitting (sitting EOB) Pt Will Transfer to Toilet: with total assist;bedside commode (with sit>stand mechanical lift)  OT Frequency:      AM-PAC OT "6 Clicks" Daily Activity     Outcome Measure Help from another person eating meals?: A Little Help from another person taking care of personal grooming?: A Lot Help from another person toileting, which includes using toliet, bedpan, or urinal?: Total Help from another person bathing (including washing, rinsing, drying)?: A Lot Help from another person to put on and taking off regular upper body clothing?: A Lot Help from another person to put on and taking off regular lower body clothing?: Total 6 Click Score: 11   End of Session Equipment  Utilized During Treatment: Oxygen Nurse Communication: Mobility status  Activity Tolerance: Patient tolerated treatment well Patient left: in bed;with call bell/phone within reach;with bed alarm set;with family/visitor present  OT Visit Diagnosis: Muscle weakness (generalized) (M62.81)                Time: 0601-5615 OT Time Calculation (min): 25 min Charges:  OT General Charges $OT Visit: 1 Visit OT Evaluation $OT Eval Moderate Complexity: 1 Mod OT Treatments $Therapeutic Activity: 8-22 mins  Fredirick Maudlin, OTR/L [place]

## 2021-01-15 NOTE — Progress Notes (Signed)
PHARMACIST - PHYSICIAN COMMUNICATION  CONCERNING:  Enoxaparin (Lovenox) for DVT Prophylaxis    RECOMMENDATION: Patient was prescribed enoxaprin 40mg  q24 hours for VTE prophylaxis.   Filed Weights   01/13/21 2100 01/14/21 0248 01/15/21 0500  Weight: 82.4 kg (181 lb 10.5 oz) 82.5 kg (181 lb 14.1 oz) 83.8 kg (184 lb 11.9 oz)    Body mass index is 31.71 kg/m.  Estimated Creatinine Clearance: 36.8 mL/min (A) (by C-G formula based on SCr of 1.02 mg/dL (H)).   Based on [place] patient is candidate for enoxaparin 0.5mg /kg TBW SQ every 24 hours based on BMI being >30.   DESCRIPTION: Pharmacy has adjusted enoxaparin dose per [facility] policy.  Patient is now receiving enoxaparin 43 mg every 24 hours    Pearla Dubonnet, PharmD Clinical Pharmacist  01/15/2021 12:36 PM

## 2021-01-15 NOTE — Evaluation (Signed)
Clinical/Bedside Swallow Evaluation Patient Details  Name: [name] MRN: [id number] Date of Birth: 1926/04/14  Today's Date: 01/15/2021 Time: SLP Start Time (ACUTE ONLY): 1110 SLP Stop Time (ACUTE ONLY): 1150 SLP Time Calculation (min) (ACUTE ONLY): 40 min  Past Medical History:  Past Medical History:  Diagnosis Date  . Anxiety   . Arthritis   . Cancer [facility])    uterine and questionable vulvar, s/p hysterectomy  . Chronic headaches   . Cystocele   . Dementia ([place])    able to sign own papers  . Depression   . GERD (gastroesophageal reflux disease)   . Glaucoma   . Hypercholesterolemia   . Hypertension   . Macular degeneration   . Reactive airway disease   . Rectocele   . Right bundle branch block   . Sleep apnea   . Uterine cancer ([place])   . Vertigo    none - several yrs  . Wears dentures    full upper and lower   Past Surgical History:  Past Surgical History:  Procedure Laterality Date  . ABDOMINAL HYSTERECTOMY    . ABDOMINAL HYSTERECTOMY    . APPENDECTOMY    . CHOLECYSTECTOMY    . IMAGE GUIDED SINUS SURGERY N/A 06/20/2015   Procedure: IMAGE GUIDED SINUS SURGERY;  Surgeon: Clyde Canterbury, MD;  Location: [place];  Service: ENT;  Laterality: N/A;  GAVE DISK TO CE CE  . REPLACEMENT TOTAL KNEE Left   . SPHENOIDECTOMY Bilateral 06/20/2015   Procedure: SPHENOIDECTOMY;  Surgeon: Clyde Canterbury, MD;  Location: [place];  Service: ENT;  Laterality: Bilateral;  . uterine cancer    . VULVECTOMY     HPI:  Per admitting H&P "[name] is a 87 y.o. female with medical history significant of dementia, anxiety, depression, cervical spine disease, chronic headaches, chronic pain, osteoarthritis, hypertension, chronic falls, GERD, hyperlipidemia, decreased vision, urinary incontinence, pulmonary hypertension, uterine cancer status post hysterectomy, right bundle branch block, wears dentures who presents with altered mental status and history of frequent  falls.              History obtained with assistance of patient's daughter and chart reviewed given her altered mental status and history of dementia.  As above patient has a history of falls.  She is currently wheelchair-bound and they use a lift to get her to and from a chair or the bed to the wheelchair or the toilet.  Daughter states that patient's fall occurred when lifting her to standing.  Since that time patient has been unable to help support her weight when using the left.  She has had increased confusion since her fall yesterday as well.              Daughter states that her confusion is manifested by not recognizing her daughters which he usually can.  In the ED vaginally reported alert and oriented to self only typically she is oriented x4. Patient also has been reporting headache and hip pain on arrival.  Pain significantly improved with morphine in the ED.              Unable to get full review of systems due to dementia however daughter states that other than new pain from the fall her mother has not reported feeling unwell."   Assessment / Plan / Recommendation Clinical Impression  Pt presents with moderate oral dysphagia with impaired mastication for solid consistencies. Oral mech exam revealed Upper and lower dentures somewhat loose. Pt  tolerated several single sips of water without difficulty. Noted occasional delayed dry cough which did not seem to be related to PO's. Vocal quality remained clear and laryngeal elevation appeared adequate. Significant oral transit delay with solids and noted impaired mastication. Moderate oral residue after the swallow which took several alternating sips of liquid to clear. Discussed results and swallowing precautions with daughter. Rec Dys 2 chopped diet with thin liquids. Feed slowly and alternate liquids with solids. ST to follow up with toleration of diet and adjust if needed. Prognosis good. SLP Visit Diagnosis: Dysphagia, oropharyngeal phase (R13.12)     Aspiration Risk  Moderate aspiration risk    Diet Recommendation Dysphagia 2 (Fine chop);Thin liquid   Liquid Administration via: Cup;Straw Medication Administration: Whole meds with puree Supervision: Staff to assist with self feeding;Comment (Visually impaired) Compensations: Small sips/bites;Minimize environmental distractions;Slow rate;Other (Comment) (Allow periods of rest) Postural Changes: Seated upright at 90 degrees;Remain upright for at least 30 minutes after po intake    Other  Recommendations   PT/OT Following  Follow up Recommendations Skilled Nursing facility      Frequency and Duration min 2x/week          Prognosis Barriers to Reach Goals: Cognitive deficits      Swallow Study   General Date of Onset: 01/14/21 HPI: Per admitting H&P "[name] is a 87 y.o. female with medical history significant of dementia, anxiety, depression, cervical spine disease, chronic headaches, chronic pain, osteoarthritis, hypertension, chronic falls, GERD, hyperlipidemia, decreased vision, urinary incontinence, pulmonary hypertension, uterine cancer status post hysterectomy, right bundle branch block, wears dentures who presents with altered mental status and history of frequent falls.              History obtained with assistance of patient's daughter and chart reviewed given her altered mental status and history of dementia.  As above patient has a history of falls.  She is currently wheelchair-bound and they use a lift to get her to and from a chair or the bed to the wheelchair or the toilet.  Daughter states that patient's fall occurred when lifting her to standing.  Since that time patient has been unable to help support her weight when using the left.  She has had increased confusion since her fall yesterday as well.              Daughter states that her confusion is manifested by not recognizing her daughters which he usually can.  In the ED vaginally reported alert and  oriented to self only typically she is oriented x4. Patient also has been reporting headache and hip pain on arrival.  Pain significantly improved with morphine in the ED.              Unable to get full review of systems due to dementia however daughter states that other than new pain from the fall her mother has not reported feeling unwell." Type of Study: Bedside Swallow Evaluation Diet Prior to this Study: Regular Temperature Spikes Noted: No Respiratory Status: Room air History of Recent Intubation: No Behavior/Cognition: Alert;Cooperative;Pleasant mood;Confused Oral Cavity Assessment: Within Functional Limits;Dry Oral Care Completed by SLP: No Oral Cavity - Dentition: Dentures, top;Dentures, bottom Vision: Impaired for self-feeding Self-Feeding Abilities: Total assist Patient Positioning: Upright in bed Baseline Vocal Quality: Hoarse Volitional Cough: Strong    Oral/Motor/Sensory Function Overall Oral Motor/Sensory Function: Within functional limits   Ice Chips Ice chips: Within functional limits Presentation: Spoon   Thin Liquid Thin Liquid: Within functional  limits Presentation: Cup;Spoon;Straw    Nectar Thick Nectar Thick Liquid: Within functional limits Presentation: Spoon   Honey Thick Honey Thick Liquid: Not tested   Puree Puree: Within functional limits Presentation: Spoon   Solid     Solid: Impaired Presentation: Spoon Oral Phase Impairments: Impaired mastication;Reduced lingual movement/coordination Oral Phase Functional Implications: Right lateral sulci pocketing;Prolonged oral transit;Left lateral sulci pocketing;Impaired mastication;Oral residue      Lucila Maine 01/15/2021,12:48 PM

## 2021-01-15 NOTE — Progress Notes (Signed)
Initial Nutrition Assessment  DOCUMENTATION CODES:   Obesity unspecified  INTERVENTION:   -MVI with minerals daily -30 ml Prosource Plus TID, each supplement provides 100 kcals and 15 grams protein -Magic cup TID with meals, each supplement provides 290 kcal and 9 grams of protein  NUTRITION DIAGNOSIS:   Inadequate oral intake related to poor appetite as evidenced by meal completion < 50%,per patient/family report.  GOAL:   Patient will meet greater than or equal to 90% of their needs  MONITOR:   PO intake,Supplement acceptance,Diet advancement,Labs,Weight trends,Skin,I & O's  REASON FOR ASSESSMENT:   Malnutrition Screening Tool    ASSESSMENT:   [name] is a 87 y.o. female with medical history significant of dementia, anxiety, depression, cervical spine disease, chronic headaches, chronic pain, osteoarthritis, hypertension, chronic falls, GERD, hyperlipidemia, decreased vision, urinary incontinence, pulmonary hypertension, uterine cancer status post hysterectomy, right bundle branch block, wears dentures who presents with altered mental status and history of frequent falls.  Pt admitted with dementia and acute metabolic encephalopathy.   4/11- s/p BSE- advanced to dysphagia 2 diet with thin liquids  Reviewed I/O's: +240 ml x 24 hours and +2.7 L since admission  Spoke with pt and daughter at bedside. Per pt daughter, pt with decreased appetite for the past several months related to constipation. Pt has been on several medications, but daughter still sees no improvement ("you literally have to pull it out of her").   Observed meal tray- noted pt consumed some juice and 100% of Magic Cup. Meal completion 25%. Pt does not drinks liquids well; she needs a lot of encouragement to drink.   Pt daughter estimates mild wt loss over the past few months. However, no wt loss per review of records.   Discussed importance of good meal and supplement intake to promote healing.    Medications reviewed and include calcium-vitamin D, folic acid, melatonin, remeron, and vitamin B-12.     Lab Results  Component Value Date   HGBA1C 5.8 10/18/2016   PTA DM medications are .   Labs reviewed: CBGS: 97-156 (inpatient orders for glycemic control are none).   NUTRITION - FOCUSED PHYSICAL EXAM:  Flowsheet Row Most Recent Value  Orbital Region No depletion  Upper Arm Region No depletion  Thoracic and Lumbar Region No depletion  Buccal Region No depletion  Temple Region No depletion  Clavicle Bone Region No depletion  Clavicle and Acromion Bone Region No depletion  Scapular Bone Region No depletion  Dorsal Hand No depletion  Patellar Region No depletion  Anterior Thigh Region No depletion  Posterior Calf Region No depletion  Edema (RD Assessment) Mild  Hair Reviewed  Eyes Reviewed  Mouth Reviewed  Skin Reviewed  Nails Reviewed       Diet Order:   Diet Order            DIET DYS 2 Room service appropriate? Yes; Fluid consistency: Thin  Diet effective now                 EDUCATION NEEDS:   Education needs have been addressed  Skin:  Skin Assessment: Skin Integrity Issues: Skin Integrity Issues:: DTI DTI: rt buttocks1700-1900  Last BM:  01/15/21  Height:   Ht Readings from Last 1 Encounters:  01/13/21 5\' 4"  (1.626 m)    Weight:   Wt Readings from Last 1 Encounters:  01/15/21 83.8 kg    Ideal Body Weight:  54.5 kg  BMI:  Body mass index is 31.71 kg/m.  Estimated  Nutritional Needs:   Kcal:  1700-1900  Protein:  85-100 grams  Fluid:  > 1.7 L    Loistine Chance, RD, LDN, [place] Registered Dietitian II Certified Diabetes Care and Education Specialist Please refer to [facility] for RD and/or RD on-call/weekend/after hours pager

## 2021-01-15 NOTE — Clinical Social Work Note (Signed)
Left voicemail for [facility] RN. Will discuss SNF recommendation when she calls back.  Kaelah Hayashi, [place]

## 2021-01-15 NOTE — Evaluation (Signed)
Physical Therapy Evaluation Patient Details Name: [name] MRN: [id number] DOB: September 27, 1926 Today's Date: 01/15/2021   History of Present Illness  87 y.o. female with medical history significant of dementia, anxiety, depression, cervical spine disease, chronic headaches, chronic pain, osteoarthritis, hypertension, chronic falls, GERD, hyperlipidemia, decreased vision, urinary incontinence, pulmonary hypertension, uterine cancer status post hysterectomy, right bundle branch block, wears dentures who presents with altered mental status and history of frequent falls. She was  found to have dehydration, acute renal failure and hyperkalemia.  Clinical Impression  Pt is a pleasant 87 year old female who was admitted for acute metabolic encephalopathy. Pt performs bed mobility with mod assist. Able to sit for prolonged time, however not safe for Sit<>stand lift at this time due to strength/cognition deficits. Per family, if pt unable to use sit<>Stand lift, she would be essentially bed bound as they are fearful of using hoyer lift due to stage 2 on buttock. Pt typically gets up to [facility] at baseline and able to go to [facility]. Currently not at baseline level and is more confused than baseline. Family hopeful to be able to continue use of sit<>Stand lift. Would benefit from skilled PT to address above deficits and promote optimal return to PLOF; recommend transition to STR upon discharge from acute hospitalization.     Follow Up Recommendations SNF    Equipment Recommendations  None recommended by PT    Recommendations for Other Services       Precautions / Restrictions Precautions Precautions: Fall Restrictions Weight Bearing Restrictions: No      Mobility  Bed Mobility Overal bed mobility: Needs Assistance Bed Mobility: Supine to Sit     Supine to sit: Mod assist     General bed mobility comments: Pt able to participate, needs assist for B LE management. Once seated at EOB,  able to sit with cga. Sudden unexpected LOB, needing constant CGA and min assist to re-direct.    Transfers                 General transfer comment: not able to perform  Ambulation/Gait                Stairs            Wheelchair Mobility    Modified Rankin (Stroke Patients Only)       Balance Overall balance assessment: Needs assistance Sitting-balance support: Feet unsupported;Bilateral upper extremity supported Sitting balance-Leahy Scale: Fair Sitting balance - Comments: needs constant cga. multiple sponataneous LOB noted                                     Pertinent Vitals/Pain Pain Assessment: Faces Faces Pain Scale: Hurts little more Pain Location: b/l shoulders with movement Pain Descriptors / Indicators: Discomfort Pain Intervention(s): Limited activity within patient's tolerance;Repositioned    Home Living Family/patient expects to be discharged to:: Private residence Living Arrangements: Children;Spouse/significant other Available Help at Discharge: Family;Available 24 hours/day;Personal care attendant;Available PRN/intermittently (lives with spouse, PCA comes in morning for ADLs. Pt spends day at [facility] facility. Nanny cam for family to monitor at home via daughters) Type of Home: House Home Access: Ramped entrance     Home Layout: One level Home Equipment: Bedside commode;Wheelchair - manual;Hospital bed;Other (comment) (sit<>Stand lift- hasn't used since Friday (4/8))      Prior Function Level of Independence: Needs assistance   Gait / Transfers Assistance Needed: Uses sit>stand  hoyer lift for transfers to/from [facility] and wheelchair  ADL's / Homemaking Assistance Needed: Requires assist for all ADLs        Hand Dominance        Extremity/Trunk Assessment   Upper Extremity Assessment Upper Extremity Assessment: Defer to OT evaluation    Lower Extremity Assessment Lower Extremity Assessment: Generalized  weakness (B LE grossly 2/5)       Communication   Communication: No difficulties  Cognition Arousal/Alertness: Awake/alert Behavior During Therapy: WFL for tasks assessed/performed Overall Cognitive Status: Impaired/Different from baseline                                 General Comments: Pt A&Ox1. Family reports that pt is typically more oriented      General Comments      Exercises General Exercises - Lower Extremity Heel Slides: Both;5 reps;Supine Hip ABduction/ADduction: Both;5 reps;Supine Other Exercises Other Exercises: Educated pt and family on role of OT, POC, and discharge recommendations Other Exercises: supine/seated ther-ex including B LE hip abd/add, LAQ, SLRs, and heel slides. Needs cues/reminders for active movement. 10 reps performed   Assessment/Plan    PT Assessment Patient needs continued PT services  PT Problem List Decreased strength;Decreased activity tolerance;Decreased balance;Decreased mobility;Decreased cognition;Decreased safety awareness       PT Treatment Interventions DME instruction;Therapeutic activities;Therapeutic exercise;Balance training;Cognitive remediation;Patient/family education    PT Goals (Current goals can be found in the Care Plan section)  Acute Rehab PT Goals Patient Stated Goal: improve balance and strength to use sit<>Stand lift PT Goal Formulation: With patient/family Time For Goal Achievement: 01/29/21 Potential to Achieve Goals: Fair    Frequency Min 2X/week   Barriers to discharge        Co-evaluation               AM-PAC PT "6 Clicks" Mobility  Outcome Measure Help needed turning from your back to your side while in a flat bed without using bedrails?: A Lot Help needed moving from lying on your back to sitting on the side of a flat bed without using bedrails?: A Lot Help needed moving to and from a bed to a chair (including a wheelchair)?: Total Help needed standing up from a chair using  your arms (e.g., wheelchair or bedside chair)?: Total Help needed to walk in hospital room?: Total Help needed climbing 3-5 steps with a railing? : Total 6 Click Score: 8    End of Session   Activity Tolerance: Patient tolerated treatment well Patient left: in bed;with bed alarm set Nurse Communication: Mobility status PT Visit Diagnosis: Muscle weakness (generalized) (M62.81);History of falling (Z91.81)    Time: 1015-1040 PT Time Calculation (min) (ACUTE ONLY): 25 min   Charges:   PT Evaluation $PT Eval Moderate Complexity: 1 Mod PT Treatments $Therapeutic Exercise: 8-22 mins        Greggory Stallion, PT, DPT [phone]   Tresea Heine 01/15/2021, 12:17 PM

## 2021-01-15 NOTE — Progress Notes (Addendum)
PROGRESS NOTE    [name]  QJF:[id number] DOB: 10/29/1925 DOA: 01/13/2021 PCP: [facility] [place]   Chief Complaint.  Altered mental status Brief Narrative:  Carolyn Winter[place] Briggsis a 87 y.o.femalewith medical history significant ofdementia,anxiety, depression, cervical spine disease, chronic headaches, chronic pain, osteoarthritis, hypertension, chronic falls, GERD, hyperlipidemia, decreased vision, urinary incontinence, pulmonary hypertension, uterine cancer status post hysterectomy, right bundle branch block, wears dentures who presents with altered mental status and history of frequent falls. Patient has not been eating well, she has no bowel movement for the last 4 days, she does not drink  water.  By time she came to the hospital she was  found to have dehydration, acute renal failure and hyperkalemia. She was giving IV fluids and Kayexalate.  Renal function had improved, potassium level dropped down to normal.   Assessment & Plan:   Principal Problem:   Acute metabolic encephalopathy Active Problems:   Essential hypertension, benign   Loss of vision   Cervicalgia   Frequent falls   GERD (gastroesophageal reflux disease)   Dementia (HCC)   Mixed stress and urge urinary incontinence   Sleep disorder   Anxiety disorder   Depression with anxiety   Visual loss   Chronic pain syndrome   Hyperkalemia   AKI (acute kidney injury) ([place])   Rhabdomyolysis   Macrocytic anemia   Acute renal failure (ARF) ([place])  #1.  Acute metabolic encephalopathy secondary to dehydration  Elevated troponin secondary to acute kidney injury. Baseline dementia  Acute kidney injury Hyperkalemia. Mild rhabdomyolysis. Anorexia Severe constipation Patient mental status had improved, renal function also improved.  Potassium normalized. Patient was occult also giving lactulose, constipation has resolved. Patient appeared to develop a mild volume overload, she has a  cough, she was requiring 2 L oxygen, he was given 20 mg IV Lasix, she was able to take off oxygen.  She does not have short of breath, she has a cough.  She has been seen by speech therapy, diet has modified. Recheck troponin.   2.  Frequent falls Continue PT and OT.  3.  Cough with dysphagia. Has been seen by speech therapy, diet modified.   DVT prophylaxis: Lovenox Code Status: full Family Communication: Daughter updated Disposition Plan:  .   Status is: Inpatient  Remains inpatient appropriate because:Inpatient level of care appropriate due to severity of illness   Dispo: The patient is from: Home              Anticipated d/c is to: SNF              Patient currently is not medically stable to d/c.   Difficult to place patient No        I/O last 3 completed shifts: In: 1682.6 [I.V.:1182.6; IV Piggyback:500] Out: 250 [Urine:250] Total I/O In: 240 [P.O.:240] Out: -      Consultants:   None  Procedures: None  Antimicrobials: None  Subjective: Patient still has some confusion but overall has improved.  She has a cough, does not feel short of breath.  She was placed on 2 L oxygen yesterday, she is off oxygen after giving 20 mg Lasix. No abdominal pain or nausea vomiting. Patient had large bowel movement after giving lactulose yesterday. Denies any dysuria hematuria.  Objective: Vitals:   01/15/21 0500 01/15/21 0544 01/15/21 0812 01/15/21 1118  BP:  136/61 (!) 139/49 (!) 136/58  Pulse:  69 68 67  Resp:  16 16 18   Temp:  [name])  97.4 F (36.3 C)  98 F (36.7 C)  TempSrc:  Oral    SpO2:  92% 99% 93%  Weight: 83.8 kg     Height:        Intake/Output Summary (Last 24 hours) at 01/15/2021 1317 Last data filed at 01/15/2021 0953 Gross per 24 hour  Intake 240 ml  Output --  Net 240 ml   Filed Weights   01/13/21 2100 01/14/21 0248 01/15/21 0500  Weight: 82.4 kg 82.5 kg 83.8 kg    Examination:  General exam: Appears calm and comfortable  Respiratory  system: Crackles in the base bilaterally. Respiratory effort normal. Cardiovascular system: S1 & S2 heard, RRR. No JVD, murmurs, rubs, gallops or clicks. No pedal edema. Gastrointestinal system: Abdomen is nondistended, soft and nontender. No organomegaly or masses felt. Normal bowel sounds heard. Central nervous system: Alert and oriented x2. No focal neurological deficits. Extremities: Symmetric 5 x 5 power. Skin: No rashes, lesions or ulcers Psychiatry:  Mood & affect appropriate.     Data Reviewed: I have personally reviewed following labs and imaging studies  CBC: Recent Labs  Lab 01/13/21 1530 01/14/21 0417 01/15/21 0355  WBC 10.1 6.8 7.2  NEUTROABS 7.4  --  5.0  HGB 10.7* 9.9* 9.9*  HCT 34.3* 32.1* 31.8*  MCV 102.4* 104.6* 102.9*  PLT 254 215 212   Basic Metabolic Panel: Recent Labs  Lab 01/13/21 1530 01/13/21 2215 01/14/21 0417 01/15/21 0355  NA 132*  --  138 142  K 5.9* 5.5* 5.4* 4.6  CL 105  --  112* 113*  CO2 21*  --  21* 23  GLUCOSE 126*  --  95 104*  BUN 49*  --  43* 24*  CREATININE 2.00*  --  1.73* 1.02*  CALCIUM 8.9  --  8.1* 7.7*  MG  --  2.1  --  2.1   GFR: Estimated Creatinine Clearance: 36.8 mL/min (A) (by C-G formula based on SCr of 1.02 mg/dL (H)). Liver Function Tests: Recent Labs  Lab 01/13/21 1530 01/14/21 0417  AST 26 23  ALT 18 17  ALKPHOS 95 85  BILITOT 1.0 0.7  PROT 6.5 5.9*  ALBUMIN 3.2* 2.8*   Recent Labs  Lab 01/13/21 1530  LIPASE 19   No results for input(s): AMMONIA in the last 168 hours. Coagulation Profile: No results for input(s): INR, PROTIME in the last 168 hours. Cardiac Enzymes: Recent Labs  Lab 01/13/21 1530 01/14/21 0417  CKTOTAL 436* 308*   BNP (last 3 results) No results for input(s): PROBNP in the last 8760 hours. HbA1C: No results for input(s): HGBA1C in the last 72 hours. CBG: Recent Labs  Lab 01/14/21 1558 01/14/21 2042  GLUCAP 156* 97   Lipid Profile: No results for input(s): CHOL, HDL,  LDLCALC, TRIG, CHOLHDL, LDLDIRECT in the last 72 hours. Thyroid Function Tests: Recent Labs    01/13/21 2215  TSH 2.282   Anemia Panel: Recent Labs    01/13/21 1530 01/13/21 2215  VITAMINB12 2,389*  --   FOLATE  --  4.7*   Sepsis Labs: No results for input(s): PROCALCITON, LATICACIDVEN in the last 168 hours.  Recent Results (from the past 240 hour(s))  Resp Panel by RT-PCR (Flu A&B, Covid) Nasopharyngeal Swab     Status: None   Collection Time: 01/13/21  4:41 PM   Specimen: Nasopharyngeal Swab; Nasopharyngeal(NP) swabs in vial transport medium  Result Value Ref Range Status   SARS Coronavirus 2 by RT PCR NEGATIVE NEGATIVE Final  Comment: (NOTE) SARS-CoV-2 target nucleic acids are NOT DETECTED.  The SARS-CoV-2 RNA is generally detectable in upper respiratory specimens during the acute phase of infection. The lowest concentration of SARS-CoV-2 viral copies this assay can detect is 138 copies/mL. A negative result does not preclude SARS-Cov-2 infection and should not be used as the sole basis for treatment or other patient management decisions. A negative result may occur with  improper specimen collection/handling, submission of specimen other than nasopharyngeal swab, presence of viral mutation(s) within the areas targeted by this assay, and inadequate number of viral copies(<138 copies/mL). A negative result must be combined with clinical observations, patient history, and epidemiological information. The expected result is Negative.  Fact Sheet for Patients:  EntrepreneurPulse.com.au  Fact Sheet for Healthcare Providers:  IncredibleEmployment.be  This test is no t yet approved or cleared by the [place] FDA and  has been authorized for detection and/or diagnosis of SARS-CoV-2 by FDA under an Emergency Use Authorization (EUA). This EUA will remain  in effect (meaning this test can be used) for the duration of the COVID-19  declaration under Section 564(b)(1) of the Act, 21 U.S.C.section 360bbb-3(b)(1), unless the authorization is terminated  or revoked sooner.       Influenza A by PCR NEGATIVE NEGATIVE Final   Influenza B by PCR NEGATIVE NEGATIVE Final    Comment: (NOTE) The Xpert Xpress SARS-CoV-2/FLU/RSV plus assay is intended as an aid in the diagnosis of influenza from Nasopharyngeal swab specimens and should not be used as a sole basis for treatment. Nasal washings and aspirates are unacceptable for Xpert Xpress SARS-CoV-2/FLU/RSV testing.  Fact Sheet for Patients: EntrepreneurPulse.com.au  Fact Sheet for Healthcare Providers: IncredibleEmployment.be  This test is not yet approved or cleared by the [place] FDA and has been authorized for detection and/or diagnosis of SARS-CoV-2 by FDA under an Emergency Use Authorization (EUA). This EUA will remain in effect (meaning this test can be used) for the duration of the COVID-19 declaration under Section 564(b)(1) of the Act, 21 U.S.C. section 360bbb-3(b)(1), unless the authorization is terminated or revoked.  Performed at [facility], [address] [place], [place] [address]   Urine culture     Status: None   Collection Time: 01/13/21  5:31 PM   Specimen: Urine, Random  Result Value Ref Range Status   Specimen Description   Final    URINE, RANDOM Performed at [facility], [address] [place], [place] [address]    Special Requests   Final    NONE Performed at [facility], [address], [place], [place] [address]    Culture   Final    NO GROWTH Performed at [facility], [place] [address], [place], [place] [address]    Report Status 01/15/2021 FINAL  Final         Radiology Studies: DG Chest 1 View  Result Date: 01/13/2021 CLINICAL DATA:  Altered mental status.  Frequent falls. EXAM: CHEST  1 VIEW COMPARISON:  Chest x-ray dated December 30, 2019.  FINDINGS: The heart size and mediastinal contours are within normal limits. Normal pulmonary vascularity. Low lung volumes with chronic left lower lobe atelectasis/scarring. No focal consolidation, pleural effusion, or pneumothorax. No acute osseous abnormality. Severe bilateral glenohumeral osteoarthritis. IMPRESSION: 1. No active disease. Electronically Signed   By: Titus Dubin M.D.   On: 01/13/2021 16:32   CT Head Wo Contrast  Result Date: 01/13/2021 CLINICAL DATA:  Head trauma with mental status change. EXAM: CT HEAD  WITHOUT CONTRAST TECHNIQUE: Contiguous axial images were obtained from the base of the skull through the vertex without intravenous contrast. COMPARISON:  August 23, 2020 FINDINGS: Brain: No evidence of acute infarction, hemorrhage, hydrocephalus, extra-axial collection or mass lesion/mass effect. Vascular: No hyperdense vessel or unexpected calcification. Skull: Normal. Negative for fracture or focal lesion. Sinuses/Orbits: No acute finding. Other: None. IMPRESSION: No acute intracranial abnormality. Electronically Signed   By: Fidela Salisbury M.D.   On: 01/13/2021 16:39   CT Cervical Spine Wo Contrast  Result Date: 01/13/2021 CLINICAL DATA:  Neck trauma. EXAM: CT CERVICAL SPINE WITHOUT CONTRAST TECHNIQUE: Multidetector CT imaging of the cervical spine was performed without intravenous contrast. Multiplanar CT image reconstructions were also generated. COMPARISON:  July 20, 2020 FINDINGS: Alignment: Normal. Skull base and vertebrae: No acute fracture. No primary bone lesion or focal pathologic process. Soft tissues and spinal canal: No prevertebral fluid or swelling. No visible canal hematoma. Disc levels:  Multilevel osteoarthritic changes. Upper chest: Negative. Other: None. IMPRESSION: 1. No evidence of acute traumatic injury to the cervical spine. 2. Multilevel osteoarthritic changes of the cervical spine. Electronically Signed   By: Fidela Salisbury M.D.   On: 01/13/2021  16:42   MR BRAIN WO CONTRAST  Result Date: 01/13/2021 CLINICAL DATA:  Encephalopathy EXAM: MRI HEAD WITHOUT CONTRAST TECHNIQUE: Multiplanar, multiecho pulse sequences of the brain and surrounding structures were obtained without intravenous contrast. COMPARISON:  03/02/2016 FINDINGS: Brain: No acute infarct, mass effect or extra-axial collection. Focus of chronic hemorrhage in the inferior left temporal lobe. There is multifocal hyperintense T2-weighted signal within the white matter. Generalized volume loss without a clear lobar predilection. The midline structures are normal. Vascular: Major flow voids are preserved. Skull and upper cervical spine: Normal calvarium and skull base. Visualized upper cervical spine and soft tissues are normal. Sinuses/Orbits:No paranasal sinus fluid levels or advanced mucosal thickening. No mastoid or middle ear effusion. Normal orbits. IMPRESSION: 1. No acute intracranial abnormality. 2. Generalized volume loss and findings of chronic microvascular ischemia. Electronically Signed   By: Ulyses Jarred M.D.   On: 01/13/2021 21:04   CT Hip Right Wo Contrast  Result Date: 01/13/2021 CLINICAL DATA:  Right hip pain.  Frequent falls. EXAM: CT OF THE RIGHT HIP WITHOUT CONTRAST TECHNIQUE: Multidetector CT imaging of the right hip was performed according to the standard protocol. Multiplanar CT image reconstructions were also generated. COMPARISON:  Right hip x-rays from same day. FINDINGS: Bones/Joint/Cartilage No fracture or dislocation. Mild right hip osteoarthritis with chondrocalcinosis. Moderate degenerative changes of the pubic symphysis. No joint effusion. Ligaments Ligaments are suboptimally evaluated by CT. Muscles and Tendons Grossly intact.  Gluteus minimus and medius muscle atrophy. Soft tissue Small amount of subcutaneous edema overlying the right hip laterally. No fluid collection or hematoma. No soft tissue mass. IMPRESSION: 1. No acute osseous abnormality. 2. Mild right  hip osteoarthritis. Electronically Signed   By: Titus Dubin M.D.   On: 01/13/2021 17:37   DG Knee Complete 4 Views Right  Result Date: 01/13/2021 CLINICAL DATA:  Right knee pain.  Frequent falls. EXAM: RIGHT KNEE - COMPLETE 4+ VIEW COMPARISON:  Right knee x-rays dated July 14, 2018. FINDINGS: No acute fracture or dislocation. No joint effusion. Relatively unchanged bulky tricompartmental marginal osteophytes and severe patellofemoral joint space narrowing. Chondrocalcinosis again noted. Osteopenia. Soft tissues are unremarkable. IMPRESSION: 1. No acute osseous abnormality. 2. 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 Electronically Signed   By: Titus Dubin M.D.   On: 01/13/2021 16:35  DG Hip Unilat W or Wo Pelvis 2-3 Views Right  Result Date: 01/13/2021 CLINICAL DATA:  Hip pain after a fall. EXAM: DG HIP (WITH OR WITHOUT PELVIS) 2-3V RIGHT COMPARISON:  None. FINDINGS: Frontal pelvis shows diffuse bony demineralization. Degenerative changes evident in both hips. Probable old left superior and inferior pubic rami fractures. AP and cross-table lateral views of the right hip were obtained. Cross-table lateral view is nondiagnostic due to underpenetration. No femoral neck fracture is discernible. IMPRESSION: 1. No femoral neck fracture evident although cross-table lateral view is nondiagnostic due to underpenetration. If there is high clinical index of suspicion for right femoral neck injury, consider follow-up CT or MRI to further evaluate. Electronically Signed   By: Misty Stanley M.D.   On: 01/13/2021 16:33        Scheduled Meds: . brimonidine  1 drop Both Eyes BID  . calcium-vitamin D  1 tablet Oral Daily  . enoxaparin (LOVENOX) injection  0.5 mg/kg Subcutaneous Q24H  . folic acid  1 mg Oral Daily  . furosemide  40 mg Oral Daily  . gabapentin  100 mg Oral QHS  . melatonin  10 mg Oral QHS  . memantine  10 mg Oral BID  . mirtazapine  15 mg Oral  QHS  . pantoprazole  40 mg Oral Daily  . sertraline  150 mg Oral Daily  . sodium chloride flush  3 mL Intravenous Q12H  . Travoprost (BAK Free)  1 drop Both Eyes QHS  . vitamin B-12  1,000 mcg Oral Daily   Continuous Infusions:   LOS: 1 day    Time spent: 28 minutes    Sharen Hones, MD [facility]   To contact the attending provider between 7A-7P or the covering provider during after hours 7P-7A, please log into the web site [email or web address] and access using universal [place] password for that web site. If you do not have the password, please call the hospital operator.  01/15/2021, 1:17 PM

## 2021-01-16 ENCOUNTER — Inpatient Hospital Stay (HOSPITAL_COMMUNITY)
Admit: 2021-01-16 | Discharge: 2021-01-16 | Disposition: A | Discharge status: Home / Self Care | Payer: Medicare (Managed Care) | Attending: Internal Medicine | Admitting: Internal Medicine

## 2021-01-16 DIAGNOSIS — I5031 Acute diastolic (congestive) heart failure: Secondary | ICD-10-CM

## 2021-01-16 LAB — CBC WITH DIFFERENTIAL/PLATELET
Abs Immature Granulocytes: 0.05 10*3/uL (ref 0.00–0.07)
Basophils Absolute: 0 10*3/uL (ref 0.0–0.1)
Basophils Relative: 1 %
Eosinophils Absolute: 0.2 10*3/uL (ref 0.0–0.5)
Eosinophils Relative: 3 %
HCT: 31.3 % — ABNORMAL LOW (ref 36.0–46.0)
Hemoglobin: 9.7 g/dL — ABNORMAL LOW (ref 12.0–15.0)
Immature Granulocytes: 1 %
Lymphocytes Relative: 27 %
Lymphs Abs: 1.9 10*3/uL (ref 0.7–4.0)
MCH: 31.8 pg (ref 26.0–34.0)
MCHC: 31 g/dL (ref 30.0–36.0)
MCV: 102.6 fL — ABNORMAL HIGH (ref 80.0–100.0)
Monocytes Absolute: 0.6 10*3/uL (ref 0.1–1.0)
Monocytes Relative: 8 %
Neutro Abs: 4.2 10*3/uL (ref 1.7–7.7)
Neutrophils Relative %: 60 %
Platelets: 233 10*3/uL (ref 150–400)
RBC: 3.05 MIL/uL — ABNORMAL LOW (ref 3.87–5.11)
RDW: 12.6 % (ref 11.5–15.5)
WBC: 6.9 10*3/uL (ref 4.0–10.5)
nRBC: 0 % (ref 0.0–0.2)

## 2021-01-16 LAB — MAGNESIUM: Magnesium: 2 mg/dL (ref 1.7–2.4)

## 2021-01-16 LAB — BASIC METABOLIC PANEL
Anion gap: 6 (ref 5–15)
BUN: 16 mg/dL (ref 8–23)
CO2: 24 mmol/L (ref 22–32)
Calcium: 7.9 mg/dL — ABNORMAL LOW (ref 8.9–10.3)
Chloride: 112 mmol/L — ABNORMAL HIGH (ref 98–111)
Creatinine, Ser: 0.78 mg/dL (ref 0.44–1.00)
GFR, Estimated: >60 mL/min (ref >60)
Glucose, Bld: 104 mg/dL — ABNORMAL HIGH (ref 70–99)
Potassium: 4 mmol/L (ref 3.5–5.1)
Sodium: 142 mmol/L (ref 135–145)

## 2021-01-16 LAB — ECHOCARDIOGRAM COMPLETE
Height: 64 in
S' Lateral: 2.3 cm
Weight: 2885.38 oz

## 2021-01-16 MED ORDER — GUAIFENESIN-DM 100-10 MG/5ML PO SYRP
5.0000 mL | ORAL_SOLUTION | ORAL | Status: DC | PRN
  Filled 2021-01-16 (×2): qty 5

## 2021-01-16 MED ORDER — AMLODIPINE BESYLATE 5 MG PO TABS
5.0000 mg | ORAL_TABLET | Freq: Every day | ORAL | Status: DC
  Administered 2021-01-16 – 2021-01-17 (×2): 5 mg via ORAL
  Filled 2021-01-16: qty 1

## 2021-01-16 NOTE — Progress Notes (Signed)
Patients daughter stated she has developed a cough. Asked me to message MD for cough medicine. Daughter states she gets Tussin DM at home.

## 2021-01-16 NOTE — Progress Notes (Signed)
PROGRESS NOTE    [name]  NGE:[id number] DOB: March 23, 1926 DOA: 01/13/2021 PCP: [facility] [place]   CC:  Altered mental status. Brief Narrative:  Carolyn Jessie Briggsis a 87 y.o.femalewith medical history significant ofdementia,anxiety, depression, cervical spine disease, chronic headaches, chronic pain, osteoarthritis, hypertension, chronic falls, GERD, hyperlipidemia, decreased vision, urinary incontinence, pulmonary hypertension, uterine cancer status post hysterectomy, right bundle branch block, wears dentures who presents with altered mental status and history of frequent falls. Patient has not been eating well, she has no bowel movement for the last 4 days, she does not drink water. By time she came to the hospital she was found to have dehydration, acute renal failure and hyperkalemia. She was giving IV fluids and Kayexalate.  Renal function had improved, potassium level dropped down to normal.   Assessment & Plan:   Principal Problem:   Acute metabolic encephalopathy Active Problems:   Essential hypertension, benign   Loss of vision   Cervicalgia   Frequent falls   GERD (gastroesophageal reflux disease)   Dementia (HCC)   Mixed stress and urge urinary incontinence   Sleep disorder   Anxiety disorder   Depression with anxiety   Visual loss   Chronic pain syndrome   Hyperkalemia   AKI (acute kidney injury) ([place])   Rhabdomyolysis   Macrocytic anemia   Acute renal failure (ARF) ([place])  #1. Acute metabolic encephalopathy secondary to dehydration  Elevated troponin secondary to acute kidney injury. Baseline dementia  Acute kidney injury Hyperkalemia. Mild rhabdomyolysis. Anorexia Severe constipation Patient renal function has improved, potassium also normalized.  Patient also giving lactulose, constipation has resolved. Patient required 2 L oxygen after giving fluids, she received 1 dose of 20 mg IV Lasix and additional 40 mg oral  Lasix, hypoxemia resolved, she did not develop any shortness of breath during the process. Echocardiogram is pending. Patient normally has a severe dementia and wheelchair-bound.  But her weakness seemed to be worse, pending nursing home placement.  #2.  Frequent falls.  3.  Cough with dysphagia. Past been evaluated by speech therapy, diet modified.  #4.  Macrocytic anemia. Folic acid deficiency. B12 level normal, continue oral folic acid.  #5 essential hypertension. Amlodipine added.   DVT prophylaxis: Lovenox Code Status: DNR Family Communication: [name] at the bedside. Disposition Plan:  .   Status is: Inpatient  Remains inpatient appropriate because:Unsafe d/c plan   Dispo: The patient is from: ALF              Anticipated d/c is to: SNF              Patient currently is medically stable to d/c.   Difficult to place patient No        I/O last 3 completed shifts: In: 240 [P.O.:240] Out: 500 [Urine:500] No intake/output data recorded.     Consultants:   None  Procedures: None  Antimicrobials: None  Subjective: Patient has baseline confusion, no agitation.  She slept well last night. She has no hypoxia anymore, denies any short of breath.  She has a cough which is nonproductive. No fever chills pain No abdominal pain or nausea vomiting. No dysuria or hematuria.  Objective: Vitals:   01/15/21 2006 01/16/21 0432 01/16/21 0500 01/16/21 0735  BP: (!) 147/88 (!) 156/81  (!) 173/76  Pulse: 81 67  67  Resp: 14 16  18   Temp: 98.5 F (36.9 C) 98.8 F (37.1 C)  97.7 F (36.5 C)  TempSrc: Oral Oral  Oral  SpO2: 93% 93%  92%  Weight:   81.8 kg   Height:        Intake/Output Summary (Last 24 hours) at 01/16/2021 0947 Last data filed at 01/16/2021 0434 Gross per 24 hour  Intake 240 ml  Output 500 ml  Net -260 ml   Filed Weights   01/14/21 0248 01/15/21 0500 01/16/21 0500  Weight: 82.5 kg 83.8 kg 81.8 kg    Examination:  General exam: Appears  calm and comfortable  Respiratory system: Decreased breathing sounds without crackles. Respiratory effort normal. Cardiovascular system: S1 & S2 heard, RRR. No JVD, murmurs, rubs, gallops or clicks. No pedal edema. Gastrointestinal system: Abdomen is nondistended, soft and nontender. No organomegaly or masses felt. Normal bowel sounds heard. Central nervous system: Alert and oriented x1. No focal neurological deficits. Extremities: Symmetric 5 x 5 power. Skin: No rashes, lesions or ulcers Psychiatry:  Mood & affect appropriate.     Data Reviewed: I have personally reviewed following labs and imaging studies  CBC: Recent Labs  Lab 01/13/21 1530 01/14/21 0417 01/15/21 0355 01/16/21 0442  WBC 10.1 6.8 7.2 6.9  NEUTROABS 7.4  --  5.0 4.2  HGB 10.7* 9.9* 9.9* 9.7*  HCT 34.3* 32.1* 31.8* 31.3*  MCV 102.4* 104.6* 102.9* 102.6*  PLT 254 215 220 623   Basic Metabolic Panel: Recent Labs  Lab 01/13/21 1530 01/13/21 2215 01/14/21 0417 01/15/21 0355 01/16/21 0442  NA 132*  --  138 142 142  K 5.9* 5.5* 5.4* 4.6 4.0  CL 105  --  112* 113* 112*  CO2 21*  --  21* 23 24  GLUCOSE 126*  --  95 104* 104*  BUN 49*  --  43* 24* 16  CREATININE 2.00*  --  1.73* 1.02* 0.78  CALCIUM 8.9  --  8.1* 7.7* 7.9*  MG  --  2.1  --  2.1 2.0   GFR: Estimated Creatinine Clearance: 46.4 mL/min (by C-G formula based on SCr of 0.78 mg/dL). Liver Function Tests: Recent Labs  Lab 01/13/21 1530 01/14/21 0417  AST 26 23  ALT 18 17  ALKPHOS 95 85  BILITOT 1.0 0.7  PROT 6.5 5.9*  ALBUMIN 3.2* 2.8*   Recent Labs  Lab 01/13/21 1530  LIPASE 19   No results for input(s): AMMONIA in the last 168 hours. Coagulation Profile: No results for input(s): INR, PROTIME in the last 168 hours. Cardiac Enzymes: Recent Labs  Lab 01/13/21 1530 01/14/21 0417  CKTOTAL 436* 308*   BNP (last 3 results) No results for input(s): PROBNP in the last 8760 hours. HbA1C: No results for input(s): HGBA1C in the last  72 hours. CBG: Recent Labs  Lab 01/14/21 1558 01/14/21 2042  GLUCAP 156* 97   Lipid Profile: No results for input(s): CHOL, HDL, LDLCALC, TRIG, CHOLHDL, LDLDIRECT in the last 72 hours. Thyroid Function Tests: Recent Labs    01/13/21 2215  TSH 2.282   Anemia Panel: Recent Labs    01/13/21 1530 01/13/21 2215  VITAMINB12 2,389*  --   FOLATE  --  4.7*   Sepsis Labs: No results for input(s): PROCALCITON, LATICACIDVEN in the last 168 hours.  Recent Results (from the past 240 hour(s))  Resp Panel by RT-PCR (Flu A&B, Covid) Nasopharyngeal Swab     Status: None   Collection Time: 01/13/21  4:41 PM   Specimen: Nasopharyngeal Swab; Nasopharyngeal(NP) swabs in vial transport medium  Result Value Ref Range Status   SARS Coronavirus 2 by RT PCR NEGATIVE NEGATIVE  Final    Comment: (NOTE) SARS-CoV-2 target nucleic acids are NOT DETECTED.  The SARS-CoV-2 RNA is generally detectable in upper respiratory specimens during the acute phase of infection. The lowest concentration of SARS-CoV-2 viral copies this assay can detect is 138 copies/mL. A negative result does not preclude SARS-Cov-2 infection and should not be used as the sole basis for treatment or other patient management decisions. A negative result may occur with  improper specimen collection/handling, submission of specimen other than nasopharyngeal swab, presence of viral mutation(s) within the areas targeted by this assay, and inadequate number of viral copies(<138 copies/mL). A negative result must be combined with clinical observations, patient history, and epidemiological information. The expected result is Negative.  Fact Sheet for Patients:  EntrepreneurPulse.com.au  Fact Sheet for Healthcare Providers:  IncredibleEmployment.be  This test is no t yet approved or cleared by the [place] FDA and  has been authorized for detection and/or diagnosis of SARS-CoV-2 by FDA under an  Emergency Use Authorization (EUA). This EUA will remain  in effect (meaning this test can be used) for the duration of the COVID-19 declaration under Section 564(b)(1) of the Act, 21 U.S.C.section 360bbb-3(b)(1), unless the authorization is terminated  or revoked sooner.       Influenza A by PCR NEGATIVE NEGATIVE Final   Influenza B by PCR NEGATIVE NEGATIVE Final    Comment: (NOTE) The Xpert Xpress SARS-CoV-2/FLU/RSV plus assay is intended as an aid in the diagnosis of influenza from Nasopharyngeal swab specimens and should not be used as a sole basis for treatment. Nasal washings and aspirates are unacceptable for Xpert Xpress SARS-CoV-2/FLU/RSV testing.  Fact Sheet for Patients: EntrepreneurPulse.com.au  Fact Sheet for Healthcare Providers: IncredibleEmployment.be  This test is not yet approved or cleared by the [place] FDA and has been authorized for detection and/or diagnosis of SARS-CoV-2 by FDA under an Emergency Use Authorization (EUA). This EUA will remain in effect (meaning this test can be used) for the duration of the COVID-19 declaration under Section 564(b)(1) of the Act, 21 U.S.C. section 360bbb-3(b)(1), unless the authorization is terminated or revoked.  Performed at [facility], [address], [place], [place] [address]   Urine culture     Status: None   Collection Time: 01/13/21  5:31 PM   Specimen: Urine, Random  Result Value Ref Range Status   Specimen Description   Final    URINE, RANDOM Performed at [facility], [address] [place], [place] [address]    Special Requests   Final    NONE Performed at [facility], [address], [place], [place] [address]    Culture   Final    NO GROWTH Performed at [facility], [place] [address] [place], [place] [address]    Report Status 01/15/2021 FINAL  Final         Radiology Studies: No results  found.      Scheduled Meds: . (feeding supplement) PROSource Plus  30 mL Oral TID BM  . brimonidine  1 drop Both Eyes BID  . calcium-vitamin D  1 tablet Oral Daily  . enoxaparin (LOVENOX) injection  0.5 mg/kg Subcutaneous Q24H  . folic acid  1 mg Oral Daily  . furosemide  40 mg Oral Daily  . gabapentin  100 mg Oral QHS  . melatonin  10 mg Oral QHS  . memantine  10 mg Oral BID  . mirtazapine  15 mg Oral QHS  . multivitamin with minerals  1  tablet Oral Daily  . pantoprazole  40 mg Oral Daily  . sertraline  150 mg Oral Daily  . sodium chloride flush  3 mL Intravenous Q12H  . Travoprost (BAK Free)  1 drop Both Eyes QHS  . vitamin B-12  1,000 mcg Oral Daily   Continuous Infusions:   LOS: 2 days    Time spent: 32 minutes    Sharen Hones, MD [facility]   To contact the attending provider between 7A-7P or the covering provider during after hours 7P-7A, please log into the web site [email or web address] and access using universal [place] password for that web site. If you do not have the password, please call the hospital operator.  01/16/2021, 9:47 AM

## 2021-01-16 NOTE — NC FL2 (Signed)
[place] LEVEL OF CARE SCREENING TOOL     IDENTIFICATION  Patient Name: [name] Birthdate: 1926/08/13 Sex: female Admission Date (Current Location): 01/13/2021  [place] and [place] Number:  Engineering geologist and Address:  [facility], [address] [place], [place] [address]      Provider Number: [id number]  Attending Physician Name and Address:  Sharen Hones, MD  Relative Name and Phone Number:       Current Level of Care: Hospital Recommended Level of Care: [place] Prior Approval Number:    Date Approved/Denied:   PASRR Number: [id number]  Discharge Plan: SNF    Current Diagnoses: Patient Active Problem List   Diagnosis Date Noted  . Acute renal failure (ARF) ([place]) 01/14/2021  . Acute metabolic encephalopathy 67/09/4579  . Hyperkalemia 01/13/2021  . AKI (acute kidney injury) ([place]) 01/13/2021  . Rhabdomyolysis 01/13/2021  . Macrocytic anemia 01/13/2021  . Osteoarthritis of glenohumeral joint (Severe) (Bilateral) 08/26/2018  . Osteoarthritis of AC (acromioclavicular) joint (Severe) (Bilateral) 08/26/2018  . Chronic acromioclavicular [facility]) joint pain (Bilateral) 08/26/2018  . Spondylolisthesis, cervical region (Multilevel) 08/13/2018  . Spondylolisthesis, cervicothoracic region (Multilevel) 08/13/2018  . Cervical foraminal stenosis (Severe C4-5 through C6-7) 08/13/2018  . Cervical radiculitis (Bilateral) 08/13/2018  . Cervical  Grade 1 Anterolisthesis of C4/5, C5/6, & C7/T1 08/13/2018  . Cervical facet arthropathy (Severe) (Right: C4-5, C5-6) 08/13/2018  . Cervical facet syndrome (Bilateral) (R>L) 08/13/2018  . Cervical radiculopathy due to osteoarthritis of spine 08/13/2018  . Hx of total knee replacement (Left) 07/27/2018  . DDD (degenerative disc disease), cervical 07/27/2018  . Chronic shoulder pain (Primary Area of Pain) (Bilateral) (R>L) 07/14/2018  . Chronic knee pain (Secondary Area of  Pain) (Right) 07/14/2018  . Chronic hip pain [facility] Area of Pain) (Bilateral) (R>L) 07/14/2018  . Chronic pain syndrome 07/14/2018  . Pharmacologic therapy 07/14/2018  . Disorder of skeletal system 07/14/2018  . Problems influencing health status 07/14/2018  . Hyperlipidemia 03/02/2016  . Dementia ([place]) 03/02/2016  . Vertigo 03/01/2016  . Frequent falls 12/04/2014  . Hyperglycemia 12/04/2014  . Health care maintenance 12/04/2014  . Encounter for general adult medical examination without abnormal findings 12/04/2014  . URI (upper respiratory infection) 10/24/2014  . OAB (overactive bladder) 06/17/2014  . Pain of left thumb 05/31/2014  . Pain of finger of left hand 05/31/2014  . Rectocele 03/17/2014  . Vaginal atrophy 03/17/2014  . Cough 03/14/2014  . Bladder prolapse, female, acquired 02/15/2014  . Mixed stress and urge urinary incontinence 02/15/2014  . Urethral prolapse 02/15/2014  . Dysuria 02/15/2014  . Elevated alkaline phosphatase level 01/30/2014  . Abnormal levels of other serum enzymes 01/30/2014  . Frequency of micturition 12/24/2013  . Cervicalgia 12/12/2013  . Skin lesion of breast 12/12/2013  . Disorder of breast 12/12/2013  . Loss of vision 10/11/2013  . Vision loss, left eye 10/11/2013  . Chest pain 10/11/2013  . DOE (dyspnea on exertion) 10/11/2013  . Anxiety disorder 10/11/2013  . Other forms of dyspnea 10/11/2013  . 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 10/11/2013  . Visual loss 10/11/2013  . Cervicogenic headache 02/28/2013  . History of frequent urinary tract infections 02/01/2013  . Hypercholesterolemia 02/01/2013  . Pulmonary hypertension ([place]) 02/01/2013  . Major depressive disorder, single episode 02/01/2013  . Sleep disorder 02/01/2013  . Personal history of urinary infection 02/01/2013  . Pure hypercholesterolemia 02/01/2013  . Other secondary pulmonary hypertension ([place]) 02/01/2013  .  GERD  (gastroesophageal reflux disease) 12/14/2012  . Chronic headaches 12/14/2012  . Osteoarthritis of shoulders (Bilateral) 12/14/2012  . Depression with anxiety 12/14/2012  . Dizziness 09/10/2012  . Essential hypertension, benign 09/10/2012  . Essential (primary) hypertension 09/10/2012    Orientation RESPIRATION BLADDER Height & Weight     Self  Normal Incontinent,External catheter Weight: 180 lb 5.4 oz (81.8 kg) Height:  5\' 4"  (162.6 cm)  BEHAVIORAL SYMPTOMS/MOOD NEUROLOGICAL BOWEL NUTRITION STATUS   (None)  (Dementia) Incontinent Diet (DYS 2. Add magic cup to trays.)  AMBULATORY STATUS COMMUNICATION OF NEEDS Skin   Extensive Assist Verbally Bruising,Other (Comment) (Deep tissue injury on left buttocks: Foam prn.)                       Personal Care Assistance Level of Assistance  Bathing,Feeding,Dressing Bathing Assistance: Maximum assistance Feeding assistance: Maximum assistance Dressing Assistance: Maximum assistance     Functional Limitations Info  Sight,Hearing,Speech Sight Info: Adequate Hearing Info: Adequate Speech Info: Adequate    SPECIAL CARE FACTORS FREQUENCY  PT (By licensed PT),OT (By licensed OT),Speech therapy     PT Frequency: 5 x week OT Frequency: 5 x week     Speech Therapy Frequency: 5 x week      Contractures Contractures Info: Not present    Additional Factors Info  Code Status,Allergies,Psychotropic Code Status Info: DNR Allergies Info: Ambien (Zolpidem Tartrate), Codeine Psychotropic Info: Anxiety, Depression         Current Medications (01/16/2021):  This is the current hospital active medication list Current Facility-Administered Medications  Medication Dose Route Frequency Provider Last Rate Last Admin  . (feeding supplement) PROSource Plus liquid 30 mL  30 mL Oral TID BM Sharen Hones, MD   30 mL at 01/16/21 0839  . acetaminophen (TYLENOL) tablet 650 mg  650 mg Oral Q6H PRN Marcelyn Bruins, MD       Or  . acetaminophen  (TYLENOL) suppository 650 mg  650 mg Rectal Q6H PRN Marcelyn Bruins, MD      . amLODipine (NORVASC) tablet 5 mg  5 mg Oral Daily Sharen Hones, MD      . brimonidine (ALPHAGAN) 0.15 % ophthalmic solution 1 drop  1 drop Both Eyes BID Marcelyn Bruins, MD   1 drop at 01/16/21 0839  . calcium-vitamin D (OSCAL WITH D) 500-200 MG-UNIT per tablet 1 tablet  1 tablet Oral Daily Marcelyn Bruins, MD   1 tablet at 01/16/21 [phone]  . enoxaparin (LOVENOX) injection 42.5 mg  0.5 mg/kg Subcutaneous Q24H Sharen Hones, MD   42.5 mg at 01/15/21 2142  . folic acid (FOLVITE) tablet 1 mg  1 mg Oral Daily Sharen Hones, MD   1 mg at 01/16/21 0839  . gabapentin (NEURONTIN) capsule 100 mg  100 mg Oral QHS Marcelyn Bruins, MD   100 mg at 01/15/21 2142  . melatonin tablet 10 mg  10 mg Oral QHS Marcelyn Bruins, MD   10 mg at 01/15/21 2142  . memantine (NAMENDA) tablet 10 mg  10 mg Oral BID Marcelyn Bruins, MD   10 mg at 01/16/21 0839  . mirtazapine (REMERON SOL-TAB) disintegrating tablet 15 mg  15 mg Oral QHS Marcelyn Bruins, MD   15 mg at 01/15/21 2142  . multivitamin with minerals tablet 1 tablet  1 tablet Oral Daily Sharen Hones, MD   1 tablet at 01/16/21 0839  . ondansetron (ZOFRAN) injection 4 mg  4 mg Intravenous Q6H PRN  Sharen Hones, MD   4 mg at 01/14/21 1320  . oxyCODONE (Oxy IR/ROXICODONE) immediate release tablet 5 mg  5 mg Oral Q12H PRN Marcelyn Bruins, MD   5 mg at 01/15/21 2142  . pantoprazole (PROTONIX) EC tablet 40 mg  40 mg Oral Daily Marcelyn Bruins, MD   40 mg at 01/16/21 [phone]  . polyethylene glycol (MIRALAX / GLYCOLAX) packet 17 g  17 g Oral Daily PRN Marcelyn Bruins, MD   17 g at 01/14/21 1320  . sertraline (ZOLOFT) tablet 150 mg  150 mg Oral Daily Marcelyn Bruins, MD   150 mg at 01/16/21 0839  . sodium chloride flush (NS) 0.9 % injection 3 mL  3 mL Intravenous Q12H Marcelyn Bruins, MD   3 mL at 01/16/21 0841  . Travoprost (BAK Free) (TRAVATAN) 0.004 % ophthalmic  solution SOLN 1 drop  1 drop Both Eyes QHS Marcelyn Bruins, MD      . vitamin B-12 (CYANOCOBALAMIN) tablet 1,000 mcg  1,000 mcg Oral Daily Marcelyn Bruins, MD   1,000 mcg at 01/16/21 0840     Discharge Medications: Please see discharge summary for a list of discharge medications.  Relevant Imaging Results:  Relevant Lab Results:   Additional Information SS#: [id number]. Daughter says she's fully vaccinated.  Candie Chroman, LCSW

## 2021-01-16 NOTE — TOC Initial Note (Addendum)
Transition of Care [facility]) - Initial/Assessment Note    Patient Details  Name: [name] MRN: [id number] Date of Birth: Mar 04, 1926  Transition of Care [facility]) CM/SW Contact:    Candie Chroman, LCSW Phone Number: 01/16/2021, 1:07 PM  Clinical Narrative: Received call from [place] at [facility]. They are agreeable to SNF placement. CSW met with patient's daughter Romie Minus at bedside. Gave CMS scores for the local SNF's that are in network with [facility]: [facility], [facility], and [facility]. Daughter will discuss with her sister and patient's husband but said [facility] would be first preference because patient has been there before. CSW called patient's husband. He is agreeable to whichever facility [facility] will cover. Sent referral. Later received call from [place] stating they spoke with husband and his first preference is [facility] as well. [facility] has made a bed offer. Left voicemail for admissions coordinator. Patient is fully vaccinated for COVID: Moderna 10/29/19, 11/26/19, 10/02/20. No further concerns. CSW encouraged patient to contact CSW as needed. CSW will continue to follow patient for support and facilitate discharge to SNF once medically stable.                3:44 pm: [facility] will have a bed tomorrow. MD anticipates she will be stable for discharge by tomorrow. Asked him to order a COVID test. Left voicemail for [place] at [facility]. Tried calling husband but voicemail is full. Asked RN to notify family at bedside.  5:11 pm: Called husband and provided update.  Expected Discharge Plan: Skilled Nursing Facility Barriers to Discharge: Continued Medical Work up   Patient Goals and CMS Choice   CMS Medicare.gov Compare Post Acute Care list provided to:: Patient Represenative (must comment) (Daughter)    Expected Discharge Plan and Services Expected Discharge Plan: [place] Choice: [place]  arrangements for the past 2 months: Single Family Home                                      Prior Living Arrangements/Services Living arrangements for the past 2 months: Single Family Home Lives with:: Spouse Patient language and need for interpreter reviewed:: Yes Do you feel safe going back to the place where you live?: Yes      Need for Family Participation in Patient Care: Yes (Comment) Care giver support system in place?: Yes (comment)   Criminal Activity/Legal Involvement Pertinent to Current Situation/Hospitalization: No - Comment as needed  Activities of Daily Living Home Assistive Devices/Equipment: Hoyer Lift ADL Screening (condition at time of admission) Patient's cognitive ability adequate to safely complete daily activities?: No Is the patient deaf or have difficulty hearing?: Yes Does the patient have difficulty seeing, even when wearing glasses/contacts?: Yes Does the patient have difficulty concentrating, remembering, or making decisions?: Yes Patient able to express need for assistance with ADLs?: Yes Does the patient have difficulty dressing or bathing?: Yes Independently performs ADLs?: No Communication: Independent Dressing (OT): Dependent Is this a change from baseline?: Pre-admission baseline Grooming: Dependent Is this a change from baseline?: Pre-admission baseline Feeding: Dependent Is this a change from baseline?: Pre-admission baseline Bathing: Dependent Is this a change from baseline?: Pre-admission baseline Toileting: Dependent Is this a change from baseline?: Pre-admission baseline In/Out Bed: Dependent Is this a change from baseline?: Pre-admission baseline Does the patient have difficulty walking  or climbing stairs?: Yes Weakness of Legs: Both Weakness of Arms/Hands: Both  Permission Sought/Granted Permission sought to share information with : Facility Contact Representative,Family Supports    Share Information with NAME: Vertie Dibbern, Romie Minus  Permission granted to share info w AGENCY: SNF's  Permission granted to share info w Relationship: Husband, Daughter  Permission granted to share info w Contact Information: [phone]  Emotional Assessment Appearance:: Appears stated age Attitude/Demeanor/Rapport: Unable to Assess Affect (typically observed): Unable to Assess Orientation: : Oriented to Self Alcohol / Substance Use: Not Applicable Psych Involvement: No (comment)  Admission diagnosis:  Dehydration [E86.0] Confusion [R41.0] AKI (acute kidney injury) ([place]) [N17.9] Chronic dementia without behavioral disturbance (HCC) [H96.22] Acute metabolic encephalopathy [W97.98] Acute renal failure (ARF) ([place]) [N17.9] Patient Active Problem List   Diagnosis Date Noted  . Acute renal failure (ARF) ([place]) 01/14/2021  . Acute metabolic encephalopathy 92/08/9416  . Hyperkalemia 01/13/2021  . AKI (acute kidney injury) ([place]) 01/13/2021  . Rhabdomyolysis 01/13/2021  . Macrocytic anemia 01/13/2021  . Osteoarthritis of glenohumeral joint (Severe) (Bilateral) 08/26/2018  . Osteoarthritis of AC (acromioclavicular) joint (Severe) (Bilateral) 08/26/2018  . Chronic acromioclavicular [facility]) joint pain (Bilateral) 08/26/2018  . Spondylolisthesis, cervical region (Multilevel) 08/13/2018  . Spondylolisthesis, cervicothoracic region (Multilevel) 08/13/2018  . Cervical foraminal stenosis (Severe C4-5 through C6-7) 08/13/2018  . Cervical radiculitis (Bilateral) 08/13/2018  . Cervical  Grade 1 Anterolisthesis of C4/5, C5/6, & C7/T1 08/13/2018  . Cervical facet arthropathy (Severe) (Right: C4-5, C5-6) 08/13/2018  . Cervical facet syndrome (Bilateral) (R>L) 08/13/2018  . Cervical radiculopathy due to osteoarthritis of spine 08/13/2018  . Hx of total knee replacement (Left) 07/27/2018  . DDD (degenerative disc disease), cervical 07/27/2018  . Chronic shoulder pain (Primary Area of Pain) (Bilateral) (R>L) 07/14/2018  . Chronic knee pain  (Secondary Area of Pain) (Right) 07/14/2018  . Chronic hip pain [facility] Area of Pain) (Bilateral) (R>L) 07/14/2018  . Chronic pain syndrome 07/14/2018  . Pharmacologic therapy 07/14/2018  . Disorder of skeletal system 07/14/2018  . Problems influencing health status 07/14/2018  . Hyperlipidemia 03/02/2016  . Dementia ([place]) 03/02/2016  . Vertigo 03/01/2016  . Frequent falls 12/04/2014  . Hyperglycemia 12/04/2014  . Health care maintenance 12/04/2014  . Encounter for general adult medical examination without abnormal findings 12/04/2014  . URI (upper respiratory infection) 10/24/2014  . OAB (overactive bladder) 06/17/2014  . Pain of left thumb 05/31/2014  . Pain of finger of left hand 05/31/2014  . Rectocele 03/17/2014  . Vaginal atrophy 03/17/2014  . Cough 03/14/2014  . Bladder prolapse, female, acquired 02/15/2014  . Mixed stress and urge urinary incontinence 02/15/2014  . Urethral prolapse 02/15/2014  . Dysuria 02/15/2014  . Elevated alkaline phosphatase level 01/30/2014  . Abnormal levels of other serum enzymes 01/30/2014  . Frequency of micturition 12/24/2013  . Cervicalgia 12/12/2013  . Skin lesion of breast 12/12/2013  . Disorder of breast 12/12/2013  . Loss of vision 10/11/2013  . Vision loss, left eye 10/11/2013  . Chest pain 10/11/2013  . DOE (dyspnea on exertion) 10/11/2013  . Anxiety disorder 10/11/2013  . Other forms of dyspnea 10/11/2013  . 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 10/11/2013  . Visual loss 10/11/2013  . Cervicogenic headache 02/28/2013  . History of frequent urinary tract infections 02/01/2013  . Hypercholesterolemia 02/01/2013  . Pulmonary hypertension ([place]) 02/01/2013  . Major depressive disorder, single episode 02/01/2013  . Sleep disorder 02/01/2013  . Personal history of urinary infection 02/01/2013  .  Pure hypercholesterolemia 02/01/2013  . Other secondary pulmonary hypertension ([place]) 02/01/2013  .  GERD (gastroesophageal reflux disease) 12/14/2012  . Chronic headaches 12/14/2012  . Osteoarthritis of shoulders (Bilateral) 12/14/2012  . Depression with anxiety 12/14/2012  . Dizziness 09/10/2012  . Essential hypertension, benign 09/10/2012  . Essential (primary) hypertension 09/10/2012   PCP:  [facility], [place]:   [place], [place] [address] Phone: [phone] Fax: [place], [place] - [address] [place] [address] Phone: [phone] Fax: [place], [place] - [place] [address] Phone: [phone] Fax: [phone]     Social Determinants of Health (SDOH) Interventions    Readmission Risk Interventions No flowsheet data found.

## 2021-01-16 NOTE — Progress Notes (Signed)
*  PRELIMINARY RESULTS* Echocardiogram 2D Echocardiogram has been performed.  [name] 01/16/2021, 12:50 PM

## 2021-01-17 LAB — SARS CORONAVIRUS 2 (TAT 6-24 HRS): SARS Coronavirus 2: NEGATIVE

## 2021-01-17 MED ORDER — OXYCODONE HCL 5 MG PO TABS: 5.0000 mg | ORAL_TABLET | Freq: Two times a day (BID) | ORAL | 0 refills | Status: AC | PRN

## 2021-01-17 MED ORDER — ACETAMINOPHEN 500 MG PO TABS: 1000.0000 mg | ORAL_TABLET | Freq: Two times a day (BID) | ORAL | 0 refills | Status: DC | PRN

## 2021-01-17 MED ORDER — POLYETHYLENE GLYCOL 3350 17 G PO PACK: 17.0000 g | PACK | Freq: Every day | ORAL | 0 refills | Status: DC

## 2021-01-17 MED ORDER — MIRTAZAPINE 15 MG PO TBDP: 15.0000 mg | ORAL_TABLET | Freq: Every day | ORAL | Status: AC

## 2021-01-17 MED ORDER — PROSOURCE PLUS PO LIQD: 30.0000 mL | Freq: Three times a day (TID) | ORAL | Status: DC

## 2021-01-17 MED ORDER — FOLIC ACID 1 MG PO TABS: 1.0000 mg | ORAL_TABLET | Freq: Every day | ORAL | Status: DC

## 2021-01-17 MED ORDER — GUAIFENESIN-DM 100-10 MG/5ML PO SYRP: 5.0000 mL | ORAL_SOLUTION | ORAL | 0 refills | Status: DC | PRN

## 2021-01-17 MED ORDER — OMEPRAZOLE 20 MG PO CPDR: 20.0000 mg | DELAYED_RELEASE_CAPSULE | Freq: Every day | ORAL | Status: DC

## 2021-01-17 MED ORDER — OXYCODONE HCL 5 MG PO TABS: 5.0000 mg | ORAL_TABLET | Freq: Two times a day (BID) | ORAL | 0 refills | Status: DC | PRN

## 2021-01-17 MED ORDER — NAPROXEN 500 MG PO TABS
500.0000 mg | ORAL_TABLET | Freq: Two times a day (BID) | ORAL | Status: DC | PRN
Start: 2021-01-17 — End: 2021-03-11

## 2021-01-17 NOTE — Discharge Summary (Addendum)
Physician Discharge Summary   [name]  female DOB: Jun 16, 1926  OJJ:[id number]  PCP: [facility] [place] date: 01/13/2021 Discharge date: 01/17/2021  Admitted From: home Disposition:  SNF CODE STATUS: DNR  Discharge Instructions    No wound care   Complete by: As directed        Hospital Course:  For full details, please see H&P, progress notes, consult notes and ancillary notes.  Briefly,  Carolyn Novell Briggsis a 87 y.o.femalewith medical history significant ofdementia,anxiety, depression, cervical spine disease, chronic headaches, chronic pain, osteoarthritis, hypertension, chronic falls, GERD, hyperlipidemia, decreased vision, urinary incontinence, pulmonary hypertension, uterine cancer status post hysterectomy, right bundle branch block, wears dentures who presents with altered mental status and history of frequent falls. Patient has not been eating well, she has no bowel movement for the last 4 days, she does not drink water. By time she came to the hospital she was found to have dehydration, acute renal failure and hyperkalemia. She was giving IV fluids and Kayexalate. Renal function had improved, potassium level dropped down to normal.  Acute metabolic encephalopathy secondary to dehydration Elevated troponin secondary to acute kidney injury. Baseline dementia  Acute kidney injury Hyperkalemia. Mild rhabdomyolysis. Anorexia Severe constipation Patient renal function has improved, potassium also normalized.  Patient also giving lactulose, constipation has resolved. Patient required 2 L oxygen after giving fluids, she received 1 dose of 20 mg IV Lasix and additional 40 mg oral Lasix, hypoxemia resolved, she did not develop any shortness of breath during the process.  Pt was on room air prior to discharge.  Frequent falls. Patient normally has a severe dementia and wheelchair-bound.  But her weakness seemed to be worse,  therefore PT rec SNF rehab.  Cough with dysphagia. Past been evaluated by speech therapy, diet modified to Dys 2 with thin liquid.  Macrocytic anemia. Folic acid deficiency. B12 level normal, continue oral folic acid.  essential hypertension. Home Lisinopril held during hospitalization due to AKI, and received amlodipine instead for HTN.  Lisinopril resumed after discharge.  Chronic pain on chronic opioids Home med listed oxycodone 5 mg BID as well as scheduled tylenol and Naproxen.  All were changed to BID PRN.  Pt's constipation was likely due to chronic opioids use.  Home senna continued, and pt started on Miralax daily scheduled.  Sundowning Pt has sundowning at home regularly.  Husband wants pt's mirtazapine to be given at 4 pm daily.   Discharge Diagnoses:  Principal Problem:   Acute metabolic encephalopathy Active Problems:   Essential hypertension, benign   Loss of vision   Cervicalgia   Frequent falls   GERD (gastroesophageal reflux disease)   Dementia (HCC)   Mixed stress and urge urinary incontinence   Sleep disorder   Anxiety disorder   Depression with anxiety   Visual loss   Chronic pain syndrome   Hyperkalemia   AKI (acute kidney injury) ([place])   Rhabdomyolysis   Macrocytic anemia   Acute renal failure (ARF) ([place])   30 Day Unplanned Readmission Risk Score   Flowsheet Row ED to Hosp-Admission (Current) from 01/13/2021 in [place] (1C)  30 Day Unplanned Readmission Risk Score (%) 15.67 Filed at 01/17/2021 0801     This score is the patient's risk of an unplanned readmission within 30 days of being discharged (0 -100%). The score is based on dignosis, age, lab data, medications, orders, and past utilization.   Low:  0-14.9   Medium:  15-21.9   High: 22-29.9   Extreme: 30 and above        Discharge Instructions:  Allergies as of 01/17/2021      Reactions   Ambien [zolpidem Tartrate] Other (See Comments)    "hallucination"   Codeine Itching      Medication List    STOP taking these medications   traMADol 50 MG tablet Commonly known as: ULTRAM     TAKE these medications   (feeding supplement) PROSource Plus liquid Take 30 mLs by mouth 3 (three) times daily between meals.   acetaminophen 500 MG tablet Commonly known as: TYLENOL Take 2 tablets (1,000 mg total) by mouth 2 (two) times daily as needed. What changed:   when to take this  reasons to take this   brimonidine 0.1 % Soln Commonly known as: ALPHAGAN P Place 1 drop into both eyes 2 (two) times daily.   Calcium Carbonate-Vitamin D3 600-400 MG-UNIT Tabs Take 1 tablet by mouth daily.   cholecalciferol 25 MCG (1000 UNIT) tablet Commonly known as: VITAMIN D Take 1,000 Units by mouth daily.   folic acid 1 MG tablet Commonly known as: FOLVITE Take 1 tablet (1 mg total) by mouth daily.   gabapentin 100 MG capsule Commonly known as: NEURONTIN Take 100 mg by mouth at bedtime.   guaiFENesin-dextromethorphan 100-10 MG/5ML syrup Commonly known as: ROBITUSSIN DM Take 5 mLs by mouth every 4 (four) hours as needed for cough.   lisinopril 5 MG tablet Commonly known as: ZESTRIL Take 5 mg by mouth daily.   Melatonin 10 MG Tabs Take 10 mg by mouth at bedtime.   memantine 10 MG tablet Commonly known as: NAMENDA Take 10 mg by mouth 2 (two) times daily.   mirtazapine 15 MG disintegrating tablet Commonly known as: REMERON SOL-TAB Take 1 tablet (15 mg total) by mouth daily. Husband wants pt's mirtazapine to be given at 4 pm daily. What changed:   when to take this  additional instructions   naproxen 500 MG tablet Commonly known as: NAPROSYN Take 1 tablet (500 mg total) by mouth 2 (two) times daily as needed. What changed:   when to take this  reasons to take this   omeprazole 20 MG capsule Commonly known as: PRILOSEC Take 1 capsule (20 mg total) by mouth daily.   oxyCODONE 5 MG immediate release tablet Commonly  known as: Oxy IR/ROXICODONE Take 1 tablet (5 mg total) by mouth 2 (two) times daily as needed for up to 10 days for severe pain (this is home med that I changed from BID scheduled to BID PRN.). What changed:   when to take this  reasons to take this   polyethylene glycol 17 g packet Commonly known as: MIRALAX / GLYCOLAX Take 17 g by mouth daily.   PRESERVISION AREDS 2+MULTI VIT PO Take 1 tablet by mouth daily.   Prolia 60 MG/ML Sosy injection Generic drug: denosumab Inject 60 mg into the skin every 6 (six) months.   senna 8.6 MG Tabs tablet Commonly known as: SENOKOT Take 2 tablets by mouth at bedtime.   sertraline 50 MG tablet Commonly known as: ZOLOFT Take 150 mg by mouth daily.   travoprost (benzalkonium) 0.004 % ophthalmic solution Commonly known as: TRAVATAN Place 1 drop into both eyes at bedtime.   traZODone 50 MG tablet Commonly known as: DESYREL Take 50 mg by mouth at bedtime.   vitamin B-12 1000 MCG tablet Commonly known as: CYANOCOBALAMIN Take 1,000 mcg by mouth daily.  Contact information for follow-up providers    [facility] [place]. Schedule an appointment as soon as possible for a visit in 1 week(s).   Contact information: [place] [address] [phone]            Contact information for after-discharge care    Destination    [facility] .   Service: Skilled Nursing Contact information: [address] [place] [phone]                  Allergies  Allergen Reactions  . Ambien [Zolpidem Tartrate] Other (See Comments)    "hallucination"  . Codeine Itching     The results of significant diagnostics from this hospitalization (including imaging, microbiology, ancillary and laboratory) are listed below for reference.   Consultations:   Procedures/Studies: DG Chest 1 View  Result Date: 01/13/2021 CLINICAL DATA:   Altered mental status.  Frequent falls. EXAM: CHEST  1 VIEW COMPARISON:  Chest x-ray dated December 30, 2019. FINDINGS: The heart size and mediastinal contours are within normal limits. Normal pulmonary vascularity. Low lung volumes with chronic left lower lobe atelectasis/scarring. No focal consolidation, pleural effusion, or pneumothorax. No acute osseous abnormality. Severe bilateral glenohumeral osteoarthritis. IMPRESSION: 1. No active disease. Electronically Signed   By: Titus Dubin M.D.   On: 01/13/2021 16:32   CT Head Wo Contrast  Result Date: 01/13/2021 CLINICAL DATA:  Head trauma with mental status change. EXAM: CT HEAD WITHOUT CONTRAST TECHNIQUE: Contiguous axial images were obtained from the base of the skull through the vertex without intravenous contrast. COMPARISON:  August 23, 2020 FINDINGS: Brain: No evidence of acute infarction, hemorrhage, hydrocephalus, extra-axial collection or mass lesion/mass effect. Vascular: No hyperdense vessel or unexpected calcification. Skull: Normal. Negative for fracture or focal lesion. Sinuses/Orbits: No acute finding. Other: None. IMPRESSION: No acute intracranial abnormality. Electronically Signed   By: Fidela Salisbury M.D.   On: 01/13/2021 16:39   CT Cervical Spine Wo Contrast  Result Date: 01/13/2021 CLINICAL DATA:  Neck trauma. EXAM: CT CERVICAL SPINE WITHOUT CONTRAST TECHNIQUE: Multidetector CT imaging of the cervical spine was performed without intravenous contrast. Multiplanar CT image reconstructions were also generated. COMPARISON:  July 20, 2020 FINDINGS: Alignment: Normal. Skull base and vertebrae: No acute fracture. No primary bone lesion or focal pathologic process. Soft tissues and spinal canal: No prevertebral fluid or swelling. No visible canal hematoma. Disc levels:  Multilevel osteoarthritic changes. Upper chest: Negative. Other: None. IMPRESSION: 1. No evidence of acute traumatic injury to the cervical spine. 2. Multilevel  osteoarthritic changes of the cervical spine. Electronically Signed   By: Fidela Salisbury M.D.   On: 01/13/2021 16:42   MR BRAIN WO CONTRAST  Result Date: 01/13/2021 CLINICAL DATA:  Encephalopathy EXAM: MRI HEAD WITHOUT CONTRAST TECHNIQUE: Multiplanar, multiecho pulse sequences of the brain and surrounding structures were obtained without intravenous contrast. COMPARISON:  03/02/2016 FINDINGS: Brain: No acute infarct, mass effect or extra-axial collection. Focus of chronic hemorrhage in the inferior left temporal lobe. There is multifocal hyperintense T2-weighted signal within the white matter. Generalized volume loss without a clear lobar predilection. The midline structures are normal. Vascular: Major flow voids are preserved. Skull and upper cervical spine: Normal calvarium and skull base. Visualized upper cervical spine and soft tissues are normal. Sinuses/Orbits:No paranasal sinus fluid levels or advanced mucosal thickening. No mastoid or middle ear effusion. Normal orbits. IMPRESSION: 1. No acute intracranial abnormality. 2. Generalized volume loss and  findings of chronic microvascular ischemia. Electronically Signed   By: Ulyses Jarred M.D.   On: 01/13/2021 21:04   CT Hip Right Wo Contrast  Result Date: 01/13/2021 CLINICAL DATA:  Right hip pain.  Frequent falls. EXAM: CT OF THE RIGHT HIP WITHOUT CONTRAST TECHNIQUE: Multidetector CT imaging of the right hip was performed according to the standard protocol. Multiplanar CT image reconstructions were also generated. COMPARISON:  Right hip x-rays from same day. FINDINGS: Bones/Joint/Cartilage No fracture or dislocation. Mild right hip osteoarthritis with chondrocalcinosis. Moderate degenerative changes of the pubic symphysis. No joint effusion. Ligaments Ligaments are suboptimally evaluated by CT. Muscles and Tendons Grossly intact.  Gluteus minimus and medius muscle atrophy. Soft tissue Small amount of subcutaneous edema overlying the right hip  laterally. No fluid collection or hematoma. No soft tissue mass. IMPRESSION: 1. No acute osseous abnormality. 2. Mild right hip osteoarthritis. Electronically Signed   By: Titus Dubin M.D.   On: 01/13/2021 17:37   DG Knee Complete 4 Views Right  Result Date: 01/13/2021 CLINICAL DATA:  Right knee pain.  Frequent falls. EXAM: RIGHT KNEE - COMPLETE 4+ VIEW COMPARISON:  Right knee x-rays dated July 14, 2018. FINDINGS: No acute fracture or dislocation. No joint effusion. Relatively unchanged bulky tricompartmental marginal osteophytes and severe patellofemoral joint space narrowing. Chondrocalcinosis again noted. Osteopenia. Soft tissues are unremarkable. IMPRESSION: 1. No acute osseous abnormality. 2. 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 Electronically Signed   By: Titus Dubin M.D.   On: 01/13/2021 16:35   ECHOCARDIOGRAM COMPLETE  Result Date: 01/16/2021    ECHOCARDIOGRAM REPORT   Patient Name:   [name] Date of Exam: 01/16/2021 Medical Rec #:  [id number]         Height:       64.0 in Accession #:    [id number]        Weight:       180.3 lb Date of Birth:  October 26, 1925         BSA:          1.872 m Patient Age:    43 years          BP:           180/75 mmHg Patient Gender: F                 HR:           66 bpm. Exam Location:  [facility] Procedure: 2D Echo, Cardiac Doppler and Color Doppler Indications:     CHF-acute diastolic F57.32  History:         Patient has prior history of Echocardiogram examinations, most                  recent 03/02/2016. Risk Factors:Hypertension. RBBB.  Sonographer:     Sherrie Sport RDCS (AE) Referring Phys:  [id number] Sharen Hones Diagnosing Phys: Nelva Bush MD  Sonographer Comments: Technically challenging study due to limited acoustic windows, no apical window and no subcostal window. IMPRESSIONS  1. Left ventricular ejection fraction, by estimation, is 60 to 65%. The left ventricle has normal function. Left ventricular  endocardial border not optimally defined to evaluate regional wall motion. There is mild left ventricular hypertrophy. Left ventricular diastolic function could not be evaluated.  2. Pulmonary artery pressure is mildly to moderately elevated (PASP 35-40 mmHg plus central venous pressure). Right ventricular systolic function is normal. The right ventricular size is not well visualized. Mildly increased right ventricular wall thickness.  3.  The mitral valve is degenerative. Mild mitral valve regurgitation.  4. Tricuspid valve regurgitation is moderate.  5. The aortic valve is tricuspid. There is mild thickening of the aortic valve. Aortic valve regurgitation not well assessed. FINDINGS  Left Ventricle: Left ventricular ejection fraction, by estimation, is 60 to 65%. The left ventricle has normal function. Left ventricular endocardial border not optimally defined to evaluate regional wall motion. The left ventricular internal cavity size was normal in size. There is mild left ventricular hypertrophy. Left ventricular diastolic function could not be evaluated. Right Ventricle: Pulmonary artery pressure is mildly to moderately elevated (PASP 35-40 mmHg plus central venous pressure). The right ventricular size is not well visualized. Mildly increased right ventricular wall thickness. Right ventricular systolic function is normal. Left Atrium: Left atrial size was not well visualized. Right Atrium: Right atrial size was not well visualized. Pericardium: The pericardium was not well visualized. Mitral Valve: The mitral valve is degenerative in appearance. There is mild thickening of the mitral valve leaflet(s). Mild mitral annular calcification. Mild mitral valve regurgitation. Tricuspid Valve: The tricuspid valve is grossly normal. Tricuspid valve regurgitation is moderate. Aortic Valve: The aortic valve is tricuspid. There is mild thickening of the aortic valve. Aortic valve regurgitation not well assessed. Pulmonic  Valve: The pulmonic valve was normal in structure. Pulmonic valve regurgitation is mild. No evidence of pulmonic stenosis. Aorta: The aortic root is normal in size and structure. Pulmonary Artery: The pulmonary artery is of normal size. IAS/Shunts: The interatrial septum was not well visualized.  LEFT VENTRICLE PLAX 2D LVIDd:         3.82 cm LVIDs:         2.30 cm LV PW:         1.16 cm LV IVS:        1.29 cm LVOT diam:     2.00 cm LVOT Area:     3.14 cm  LEFT ATRIUM         Index LA diam:    3.40 cm 1.82 cm/m                        PULMONIC VALVE AORTA                 PV Vmax:        0.55 m/s Ao Root diam: 2.90 cm PV Peak grad:   1.2 mmHg                       RVOT Peak grad: 2 mmHg  TRICUSPID VALVE TR Peak grad:   37.0 mmHg TR Vmax:        304.00 cm/s  SHUNTS Systemic Diam: 2.00 cm Nelva Bush MD Electronically signed by Nelva Bush MD Signature Date/Time: 01/16/2021/4:55:34 PM    Final    DG Hip Unilat W or Wo Pelvis 2-3 Views Right  Result Date: 01/13/2021 CLINICAL DATA:  Hip pain after a fall. EXAM: DG HIP (WITH OR WITHOUT PELVIS) 2-3V RIGHT COMPARISON:  None. FINDINGS: Frontal pelvis shows diffuse bony demineralization. Degenerative changes evident in both hips. Probable old left superior and inferior pubic rami fractures. AP and cross-table lateral views of the right hip were obtained. Cross-table lateral view is nondiagnostic due to underpenetration. No femoral neck fracture is discernible. IMPRESSION: 1. No femoral neck fracture evident although cross-table lateral view is nondiagnostic due to underpenetration. If there is high clinical index of suspicion for right femoral neck injury, consider follow-up  CT or MRI to further evaluate. Electronically Signed   By: Misty Stanley M.D.   On: 01/13/2021 16:33      Labs: BNP (last 3 results) No results for input(s): BNP in the last 8760 hours. Basic Metabolic Panel: Recent Labs  Lab 01/13/21 1530 01/13/21 2215 01/14/21 0417 01/15/21 0355  01/16/21 0442  NA 132*  --  138 142 142  K 5.9* 5.5* 5.4* 4.6 4.0  CL 105  --  112* 113* 112*  CO2 21*  --  21* 23 24  GLUCOSE 126*  --  95 104* 104*  BUN 49*  --  43* 24* 16  CREATININE 2.00*  --  1.73* 1.02* 0.78  CALCIUM 8.9  --  8.1* 7.7* 7.9*  MG  --  2.1  --  2.1 2.0   Liver Function Tests: Recent Labs  Lab 01/13/21 1530 01/14/21 0417  AST 26 23  ALT 18 17  ALKPHOS 95 85  BILITOT 1.0 0.7  PROT 6.5 5.9*  ALBUMIN 3.2* 2.8*   Recent Labs  Lab 01/13/21 1530  LIPASE 19   No results for input(s): AMMONIA in the last 168 hours. CBC: Recent Labs  Lab 01/13/21 1530 01/14/21 0417 01/15/21 0355 01/16/21 0442  WBC 10.1 6.8 7.2 6.9  NEUTROABS 7.4  --  5.0 4.2  HGB 10.7* 9.9* 9.9* 9.7*  HCT 34.3* 32.1* 31.8* 31.3*  MCV 102.4* 104.6* 102.9* 102.6*  PLT 254 215 220 233   Cardiac Enzymes: Recent Labs  Lab 01/13/21 1530 01/14/21 0417  CKTOTAL 436* 308*   BNP: Invalid input(s): POCBNP CBG: Recent Labs  Lab 01/14/21 1558 01/14/21 2042  GLUCAP 156* 97   D-Dimer No results for input(s): DDIMER in the last 72 hours. Hgb A1c No results for input(s): HGBA1C in the last 72 hours. Lipid Profile No results for input(s): CHOL, HDL, LDLCALC, TRIG, CHOLHDL, LDLDIRECT in the last 72 hours. Thyroid function studies No results for input(s): TSH, T4TOTAL, T3FREE, THYROIDAB in the last 72 hours.  Invalid input(s): FREET3 Anemia work up No results for input(s): VITAMINB12, FOLATE, FERRITIN, TIBC, IRON, RETICCTPCT in the last 72 hours. Urinalysis    Component Value Date/Time   COLORURINE YELLOW (A) 01/13/2021 1731   APPEARANCEUR HAZY (A) 01/13/2021 1731   APPEARANCEUR Hazy (A) 12/10/2016 1341   LABSPEC 1.020 01/13/2021 1731   PHURINE 5.0 01/13/2021 1731   GLUCOSEU NEGATIVE 01/13/2021 1731   GLUCOSEU NEGATIVE 01/23/2017 1207   HGBUR NEGATIVE 01/13/2021 1731   BILIRUBINUR NEGATIVE 01/13/2021 1731   BILIRUBINUR Negative 12/10/2016 1341   KETONESUR NEGATIVE 01/13/2021  1731   PROTEINUR NEGATIVE 01/13/2021 1731   UROBILINOGEN 0.2 01/23/2017 1207   NITRITE NEGATIVE 01/13/2021 1731   LEUKOCYTESUR NEGATIVE 01/13/2021 1731   Sepsis Labs Invalid input(s): PROCALCITONIN,  WBC,  LACTICIDVEN Microbiology Recent Results (from the past 240 hour(s))  Resp Panel by RT-PCR (Flu A&B, Covid) Nasopharyngeal Swab     Status: None   Collection Time: 01/13/21  4:41 PM   Specimen: Nasopharyngeal Swab; Nasopharyngeal(NP) swabs in vial transport medium  Result Value Ref Range Status   SARS Coronavirus 2 by RT PCR NEGATIVE NEGATIVE Final    Comment: (NOTE) SARS-CoV-2 target nucleic acids are NOT DETECTED.  The SARS-CoV-2 RNA is generally detectable in upper respiratory specimens during the acute phase of infection. The lowest concentration of SARS-CoV-2 viral copies this assay can detect is 138 copies/mL. A negative result does not preclude SARS-Cov-2 infection and should not be used as the sole basis for treatment or  other patient management decisions. A negative result may occur with  improper specimen collection/handling, submission of specimen other than nasopharyngeal swab, presence of viral mutation(s) within the areas targeted by this assay, and inadequate number of viral copies(<138 copies/mL). A negative result must be combined with clinical observations, patient history, and epidemiological information. The expected result is Negative.  Fact Sheet for Patients:  EntrepreneurPulse.com.au  Fact Sheet for Healthcare Providers:  IncredibleEmployment.be  This test is no t yet approved or cleared by the [place] FDA and  has been authorized for detection and/or diagnosis of SARS-CoV-2 by FDA under an Emergency Use Authorization (EUA). This EUA will remain  in effect (meaning this test can be used) for the duration of the COVID-19 declaration under Section 564(b)(1) of the Act, 21 U.S.C.section 360bbb-3(b)(1), unless the  authorization is terminated  or revoked sooner.       Influenza A by PCR NEGATIVE NEGATIVE Final   Influenza B by PCR NEGATIVE NEGATIVE Final    Comment: (NOTE) The Xpert Xpress SARS-CoV-2/FLU/RSV plus assay is intended as an aid in the diagnosis of influenza from Nasopharyngeal swab specimens and should not be used as a sole basis for treatment. Nasal washings and aspirates are unacceptable for Xpert Xpress SARS-CoV-2/FLU/RSV testing.  Fact Sheet for Patients: EntrepreneurPulse.com.au  Fact Sheet for Healthcare Providers: IncredibleEmployment.be  This test is not yet approved or cleared by the [place] FDA and has been authorized for detection and/or diagnosis of SARS-CoV-2 by FDA under an Emergency Use Authorization (EUA). This EUA will remain in effect (meaning this test can be used) for the duration of the COVID-19 declaration under Section 564(b)(1) of the Act, 21 U.S.C. section 360bbb-3(b)(1), unless the authorization is terminated or revoked.  Performed at [facility], [address], [place], [place] [address]   Urine culture     Status: None   Collection Time: 01/13/21  5:31 PM   Specimen: Urine, Random  Result Value Ref Range Status   Specimen Description   Final    URINE, RANDOM Performed at [facility], [address], [place], [place] [address]    Special Requests   Final    NONE Performed at [facility], [address], [place], [place] [address]    Culture   Final    NO GROWTH Performed at [facility], [place] [address] [place], [place] [address]    Report Status 01/15/2021 FINAL  Final  SARS CORONAVIRUS 2 (TAT 6-24 HRS) Nasopharyngeal Nasopharyngeal Swab     Status: None   Collection Time: 01/16/21  3:56 PM   Specimen: Nasopharyngeal Swab  Result Value Ref Range Status   SARS Coronavirus 2 NEGATIVE NEGATIVE Final    Comment: (NOTE) SARS-CoV-2 target nucleic acids are NOT  DETECTED.  The SARS-CoV-2 RNA is generally detectable in upper and lower respiratory specimens during the acute phase of infection. Negative results do not preclude SARS-CoV-2 infection, do not rule out co-infections with other pathogens, and should not be used as the sole basis for treatment or other patient management decisions. Negative results must be combined with clinical observations, patient history, and epidemiological information. The expected result is Negative.  Fact Sheet for Patients: SugarRoll.be  Fact Sheet for Healthcare Providers: https://www.woods-mathews.com/  This test is not yet approved or cleared by the [place] FDA and  has been authorized for detection and/or diagnosis of SARS-CoV-2 by FDA under an Emergency Use Authorization (EUA). This EUA will remain  in effect (meaning this test  can be used) for the duration of the COVID-19 declaration under Se ction 564(b)(1) of the Act, 21 U.S.C. section 360bbb-3(b)(1), unless the authorization is terminated or revoked sooner.  Performed at [facility], [place] [address], [place], [place] [address]      Total time spend on discharging this patient, including the last patient exam, discussing the hospital stay, instructions for ongoing care as it relates to all pertinent caregivers, as well as preparing the medical discharge records, prescriptions, and/or referrals as applicable, is 35 minutes.    Enzo Bi, MD  [facility] 01/17/2021, 10:43 AM

## 2021-01-17 NOTE — Care Management Important Message (Signed)
Important Message  Patient Details  Name: [name] MRN: [id number] Date of Birth: 1926/07/24   Medicare Important Message Given:  N/A - LOS <3 / Initial given by admissions     [name] 01/17/2021, 9:47 AM

## 2021-01-17 NOTE — TOC Transition Note (Signed)
Transition of Care [facility] - CM/SW Discharge Note   Patient Details  Name: Anisten Tomassi MRN: [id number] Date of Birth: Aug 10, 1926  Transition of Care [facility]) CM/SW Contact:  Candie Chroman, LCSW Phone Number: 01/17/2021, 10:46 AM   Clinical Narrative:  Patient has orders to discharge to [facility] today. RN will call report to [phone]. [facility] will pick her up around 11:15. No further concerns. CSW signing off.   Final next level of care: Skilled Nursing Facility Barriers to Discharge: Barriers Resolved   Patient Goals and CMS Choice   CMS Medicare.gov Compare Post Acute Care list provided to:: Patient Represenative (must comment) (Daughter) Choice offered to / list presented to : Spouse  Discharge Placement   Existing PASRR number confirmed : 01/16/21          Patient chooses bed at: [facility] Patient to be transferred to facility by: Rozell Searing Name of family member notified: Lashawne Dura Patient and family notified of of transfer: 01/17/21  Discharge Plan and Services     Post Acute Care Choice: [place]                               Social Determinants of Health (SDOH) Interventions     Readmission Risk Interventions No flowsheet data found.

## 2021-01-17 NOTE — Progress Notes (Signed)
Physical Therapy Treatment Patient Details Name: [name] MRN: [id number] DOB: 05/31/1926 Today's Date: 01/17/2021    History of Present Illness 87 y.o. female with medical history significant of dementia, anxiety, depression, cervical spine disease, chronic headaches, chronic pain, osteoarthritis, hypertension, chronic falls, GERD, hyperlipidemia, decreased vision, urinary incontinence, pulmonary hypertension, uterine cancer status post hysterectomy, right bundle branch block, wears dentures who presents with altered mental status and history of frequent falls. She was  found to have dehydration, acute renal failure and hyperkalemia.    PT Comments    Pt is making good progress towards goals with ability to tolerate [place] sit<>stand lift and transition to recliner. Pt eager to eat breakfast seated as is her routine. Pt non ambulatory at baseline, however still appears weaker taking 2 person assist for bed mobility. Able to participate in minimal there-ex this date. Will continue to progress as able.    Follow Up Recommendations  SNF     Equipment Recommendations  None recommended by PT    Recommendations for Other Services       Precautions / Restrictions Precautions Precautions: Fall Restrictions Weight Bearing Restrictions: No    Mobility  Bed Mobility Overal bed mobility: Needs Assistance Bed Mobility: Rolling;Supine to Sit Rolling: Mod assist   Supine to sit: Mod assist;+2 for physical assistance     General bed mobility comments: Pt able to roll towards R side for rolling to remove bed pan. Then able to perform transition to EOB with +2 assist. Reports pain with movement. Once seated, able to sit with cga    Transfers Overall transfer level: Needs assistance               General transfer comment: able to tolerate sit<>Stand lift and transferred to recliner. Tolerated well and positioned in recliner  Ambulation/Gait             General Gait  Details: non ambulatory at baseline   Stairs             Wheelchair Mobility    Modified Rankin (Stroke Patients Only)       Balance Overall balance assessment: Needs assistance Sitting-balance support: Feet unsupported;Bilateral upper extremity supported Sitting balance-Leahy Scale: Fair                                      Cognition Arousal/Alertness: Awake/alert Behavior During Therapy: WFL for tasks assessed/performed Overall Cognitive Status: History of cognitive impairments - at baseline                                 General Comments: appears to be approaching baseline mentation      Exercises Other Exercises Other Exercises: Seated ther-ex including B LE SLRs, AP, and hip abd/add. Only able to tolerate ~5 reps and reports pain. Other Exercises: Rolling performed to remove bed pan. Hygiene performed with +2 assist. Pt unable to assist in hygiene. +BM    General Comments        Pertinent Vitals/Pain Pain Assessment: Faces Faces Pain Scale: Hurts a little bit Pain Location: b/l shoulders with movement Pain Descriptors / Indicators: Discomfort Pain Intervention(s): Limited activity within patient's tolerance;Repositioned    Home Living                      Prior Function  PT Goals (current goals can now be found in the care plan section) Acute Rehab PT Goals Patient Stated Goal: improve balance and strength to use sit<>Stand lift PT Goal Formulation: With patient/family Time For Goal Achievement: 01/29/21 Potential to Achieve Goals: Fair Progress towards PT goals: Progressing toward goals    Frequency    Min 2X/week      PT Plan Current plan remains appropriate    Co-evaluation              AM-PAC PT "6 Clicks" Mobility   Outcome Measure  Help needed turning from your back to your side while in a flat bed without using bedrails?: A Lot Help needed moving from lying on your back  to sitting on the side of a flat bed without using bedrails?: A Lot Help needed moving to and from a bed to a chair (including a wheelchair)?: Total Help needed standing up from a chair using your arms (e.g., wheelchair or bedside chair)?: Total Help needed to walk in hospital room?: Total Help needed climbing 3-5 steps with a railing? : Total 6 Click Score: 8    End of Session   Activity Tolerance: Patient tolerated treatment well Patient left: in chair;with chair alarm set Nurse Communication: Mobility status PT Visit Diagnosis: Muscle weakness (generalized) (M62.81);History of falling (Z91.81)     Time: 6333-5456 PT Time Calculation (min) (ACUTE ONLY): 40 min  Charges:  $Therapeutic Exercise: 8-22 mins $Therapeutic Activity: 23-37 mins                     Greggory Stallion, PT, DPT [phone]    [name] 01/17/2021, 10:56 AM

## 2021-01-17 NOTE — TOC Progression Note (Addendum)
Transition of Care [facility]) - Progression Note    Patient Details  Name: Jaileigh Weimer MRN: [id number] Date of Birth: Oct 15, 1925  Transition of Care [facility]) CM/SW [place], LCSW Phone Number: 01/17/2021, 9:37 AM  Clinical Narrative:  COVID negative. [facility] still needs to see how patient tolerates hoyer lift to chair to determine if she can transport using their [facility]i or if she needs EMS. Sent message to PT and RN to notify.   9:58 am: Per PT, patient tolerated lift to chair well. [facility] will plan on picking patient up and transporting [facility]. Will arrange a time once discharge summary is in.  Expected Discharge Plan: [place] Barriers to Discharge: Continued Medical Work up  Expected Discharge Plan and Services Expected Discharge Plan: [place] Choice: [place] arrangements for the past 2 months: Single Family Home Expected Discharge Date: 01/17/21                                     Social Determinants of Health (SDOH) Interventions    Readmission Risk Interventions No flowsheet data found.

## 2021-01-17 NOTE — Progress Notes (Signed)
Speech Language Pathology Treatment: Dysphagia  Patient Details Name: [name] MRN: [id number] DOB: [birth year] Today's Date: 01/17/2021 Time: 2119-4174 SLP Time Calculation (min) (ACUTE ONLY): 45 min  Assessment / Plan / Recommendation Clinical Impression  Pt seen for ongoing assessment of swallowing. She appears alert today; verbally responsive and able to follow basic instructions w/ min-mod cues. Pt is on RA; wbc wnl. NSG present for some of the session for meds. Husband present describing pt's confusion later in the afternoons, "sundowning" he called it and that she has a certain medication for the anxiety. Pt was sitting in chair post PT session. Pt explained general aspiration precautions and agreed verbally to the need for following them especially sitting upright for all oral intake and taking small, single sips. Unsure of pt's full level of comprehension/follow through d/t Baseline Dementia. Pt assisted w/ positioning more forward d/t weakness then given trials of thin liquids via Straw, and purees (then Pills Whole in tsps of Puree w/ NSG present). No overt clinical s/s of aspiration were noted w/ any consistency; respiratory status remained calm and unlabored, vocal quality clear b/t trials. Pt helped to hold Cup when drinking following instructions for single, small sips slowly. Pills were swallowed Whole in Puree which she tolerated well; the puree providing cohesion for swallowing tablets. Oral phase appeared grossly [facility] for bolus management and timely A-P transfer for swallowing for trials given; oral clearing achieved w/ all consistencies.  Recommend continue a Dysphagia level 2 diet (minced foods) w/ gravies added to moisten foods; Thin liquids. Recommend general aspiration precautions; Pills Whole in Puree; tray setup and positioning assistance for meals. Feeding Support and monitoring at meals. ST services will continue to f/u w/ pt for toleration of diet and education as  needed while admitted. NSG updated. Precautions posted at bedside. Husband agreed.    HPI HPI: Per admitting H&P "[name] is a 87 y.o. female with medical history Significant of Dementia, anxiety, depression, cervical spine disease, chronic headaches, chronic pain, osteoarthritis, hypertension, chronic falls, GERD, hyperlipidemia, decreased vision, urinary incontinence, pulmonary hypertension, uterine cancer status post hysterectomy, right bundle branch block, wears dentures who presents with altered mental status and history of frequent falls.              History obtained with assistance of patient's daughter and chart reviewed given her altered mental status and history of dementia.  As above patient has a history of falls.  She is currently wheelchair-bound and they use a lift to get her to and from a chair or the bed to the wheelchair or the toilet.  Daughter states that patient's fall occurred when lifting her to standing.  Since that time patient has been unable to help support her weight when using the left.  She has had increased confusion since her fall yesterday as well.              Daughter states that her confusion is manifested by not recognizing her daughters which she usually can.  Patient also has been reporting headache and hip pain on arrival.  Pain significantly improved with morphine in the ED.              Unable to get full review of systems due to dementia however daughter states that other than new pain from the fall her mother has not reported feeling unwell."      SLP Plan  Continue with current plan of care       Recommendations  Diet recommendations: Dysphagia 2 (fine chop);Thin liquid (w/ added purees) Liquids provided via: Straw Medication Administration: Whole meds with puree (for safer swallowing) Supervision: Staff to assist with self feeding;Full supervision/cueing for compensatory strategies Compensations: Minimize environmental distractions;Slow  rate;Small sips/bites;Lingual sweep for clearance of pocketing;Follow solids with liquid Postural Changes and/or Swallow Maneuvers: Seated upright 90 degrees;Upright 30-60 min after meal;Out of bed for meals                General recommendations:  (dietician f/u for support; Palliative care for any needs) Oral Care Recommendations: Oral care BID;Oral care before and after PO;Staff/trained caregiver to provide oral care ([place] care) Follow up Recommendations: Skilled Nursing facility (TBD) SLP Visit Diagnosis: Dysphagia, oropharyngeal phase (R13.12) (baseline Cognitive decline) Plan: Continue with current plan of care       GO                 Orinda Kenner, [place], [place] Pathologist Rehab Services [phone] [facility] 01/17/2021, 4:43 PM

## 2021-03-11 ENCOUNTER — Inpatient Hospital Stay
Admission: EM | Admit: 2021-03-11 | Discharge: 2021-03-20 | DRG: 871 | Disposition: A | Discharge status: Skilled Nursing Facility | Payer: Medicare (Managed Care) | Attending: Internal Medicine | Admitting: Internal Medicine

## 2021-03-11 ENCOUNTER — Encounter: Payer: Self-pay | Admitting: Radiology

## 2021-03-11 ENCOUNTER — Other Ambulatory Visit: Payer: Self-pay

## 2021-03-11 ENCOUNTER — Emergency Department: Payer: Medicare (Managed Care)

## 2021-03-11 DIAGNOSIS — N179 Acute kidney failure, unspecified: Secondary | ICD-10-CM | POA: Diagnosis present

## 2021-03-11 DIAGNOSIS — E785 Hyperlipidemia, unspecified: Secondary | ICD-10-CM | POA: Diagnosis present

## 2021-03-11 DIAGNOSIS — G4710 Hypersomnia, unspecified: Secondary | ICD-10-CM | POA: Diagnosis present

## 2021-03-11 DIAGNOSIS — F419 Anxiety disorder, unspecified: Secondary | ICD-10-CM | POA: Diagnosis present

## 2021-03-11 DIAGNOSIS — F0390 Unspecified dementia without behavioral disturbance: Secondary | ICD-10-CM | POA: Diagnosis present

## 2021-03-11 DIAGNOSIS — F418 Other specified anxiety disorders: Secondary | ICD-10-CM | POA: Diagnosis not present

## 2021-03-11 DIAGNOSIS — K219 Gastro-esophageal reflux disease without esophagitis: Secondary | ICD-10-CM | POA: Diagnosis present

## 2021-03-11 DIAGNOSIS — J9601 Acute respiratory failure with hypoxia: Secondary | ICD-10-CM

## 2021-03-11 DIAGNOSIS — L89302 Pressure ulcer of unspecified buttock, stage 2: Secondary | ICD-10-CM | POA: Diagnosis present

## 2021-03-11 DIAGNOSIS — Z66 Do not resuscitate: Secondary | ICD-10-CM | POA: Diagnosis present

## 2021-03-11 DIAGNOSIS — A419 Sepsis, unspecified organism: Principal | ICD-10-CM

## 2021-03-11 DIAGNOSIS — Z9049 Acquired absence of other specified parts of digestive tract: Secondary | ICD-10-CM | POA: Diagnosis not present

## 2021-03-11 DIAGNOSIS — G4733 Obstructive sleep apnea (adult) (pediatric): Secondary | ICD-10-CM | POA: Diagnosis present

## 2021-03-11 DIAGNOSIS — F32A Depression, unspecified: Secondary | ICD-10-CM | POA: Diagnosis present

## 2021-03-11 DIAGNOSIS — Z90710 Acquired absence of both cervix and uterus: Secondary | ICD-10-CM | POA: Diagnosis not present

## 2021-03-11 DIAGNOSIS — Z79899 Other long term (current) drug therapy: Secondary | ICD-10-CM

## 2021-03-11 DIAGNOSIS — G9341 Metabolic encephalopathy: Secondary | ICD-10-CM | POA: Diagnosis present

## 2021-03-11 DIAGNOSIS — Z20822 Contact with and (suspected) exposure to covid-19: Secondary | ICD-10-CM | POA: Diagnosis present

## 2021-03-11 DIAGNOSIS — J189 Pneumonia, unspecified organism: Secondary | ICD-10-CM

## 2021-03-11 DIAGNOSIS — G894 Chronic pain syndrome: Secondary | ICD-10-CM | POA: Diagnosis present

## 2021-03-11 DIAGNOSIS — Z888 Allergy status to other drugs, medicaments and biological substances status: Secondary | ICD-10-CM

## 2021-03-11 DIAGNOSIS — Y95 Nosocomial condition: Secondary | ICD-10-CM | POA: Diagnosis present

## 2021-03-11 DIAGNOSIS — Z885 Allergy status to narcotic agent status: Secondary | ICD-10-CM | POA: Diagnosis not present

## 2021-03-11 DIAGNOSIS — I10 Essential (primary) hypertension: Secondary | ICD-10-CM | POA: Diagnosis present

## 2021-03-11 DIAGNOSIS — N390 Urinary tract infection, site not specified: Secondary | ICD-10-CM

## 2021-03-11 DIAGNOSIS — E7800 Pure hypercholesterolemia, unspecified: Secondary | ICD-10-CM | POA: Diagnosis present

## 2021-03-11 DIAGNOSIS — Z8542 Personal history of malignant neoplasm of other parts of uterus: Secondary | ICD-10-CM | POA: Diagnosis not present

## 2021-03-11 DIAGNOSIS — B9620 Unspecified Escherichia coli [E. coli] as the cause of diseases classified elsewhere: Secondary | ICD-10-CM | POA: Diagnosis present

## 2021-03-11 DIAGNOSIS — L8990 Pressure ulcer of unspecified site, unspecified stage: Secondary | ICD-10-CM | POA: Insufficient documentation

## 2021-03-11 DIAGNOSIS — R42 Dizziness and giddiness: Secondary | ICD-10-CM | POA: Diagnosis present

## 2021-03-11 DIAGNOSIS — R0902 Hypoxemia: Secondary | ICD-10-CM

## 2021-03-11 DIAGNOSIS — I4510 Unspecified right bundle-branch block: Secondary | ICD-10-CM | POA: Diagnosis present

## 2021-03-11 DIAGNOSIS — Z806 Family history of leukemia: Secondary | ICD-10-CM

## 2021-03-11 DIAGNOSIS — R509 Fever, unspecified: Secondary | ICD-10-CM | POA: Diagnosis not present

## 2021-03-11 DIAGNOSIS — Z7951 Long term (current) use of inhaled steroids: Secondary | ICD-10-CM

## 2021-03-11 DIAGNOSIS — Z96652 Presence of left artificial knee joint: Secondary | ICD-10-CM | POA: Diagnosis present

## 2021-03-11 DIAGNOSIS — Z79891 Long term (current) use of opiate analgesic: Secondary | ICD-10-CM

## 2021-03-11 DIAGNOSIS — Z8249 Family history of ischemic heart disease and other diseases of the circulatory system: Secondary | ICD-10-CM

## 2021-03-11 DIAGNOSIS — N3000 Acute cystitis without hematuria: Secondary | ICD-10-CM | POA: Diagnosis not present

## 2021-03-11 LAB — CBC WITH DIFFERENTIAL/PLATELET
Abs Immature Granulocytes: 0.07 10*3/uL (ref 0.00–0.07)
Basophils Absolute: 0.1 10*3/uL (ref 0.0–0.1)
Basophils Relative: 0 %
Eosinophils Absolute: 0 10*3/uL (ref 0.0–0.5)
Eosinophils Relative: 0 %
HCT: 33.3 % — ABNORMAL LOW (ref 36.0–46.0)
Hemoglobin: 10.5 g/dL — ABNORMAL LOW (ref 12.0–15.0)
Immature Granulocytes: 1 %
Lymphocytes Relative: 9 %
Lymphs Abs: 1.2 10*3/uL (ref 0.7–4.0)
MCH: 30.1 pg (ref 26.0–34.0)
MCHC: 31.5 g/dL (ref 30.0–36.0)
MCV: 95.4 fL (ref 80.0–100.0)
Monocytes Absolute: 0.8 10*3/uL (ref 0.1–1.0)
Monocytes Relative: 6 %
Neutro Abs: 11.5 10*3/uL — ABNORMAL HIGH (ref 1.7–7.7)
Neutrophils Relative %: 84 %
Platelets: 238 10*3/uL (ref 150–400)
RBC: 3.49 MIL/uL — ABNORMAL LOW (ref 3.87–5.11)
RDW: 13 % (ref 11.5–15.5)
WBC: 13.6 10*3/uL — ABNORMAL HIGH (ref 4.0–10.5)
nRBC: 0 % (ref 0.0–0.2)

## 2021-03-11 LAB — BRAIN NATRIURETIC PEPTIDE: B Natriuretic Peptide: 381.5 pg/mL — ABNORMAL HIGH (ref 0.0–100.0)

## 2021-03-11 LAB — URINALYSIS, COMPLETE (UACMP) WITH MICROSCOPIC
Bilirubin Urine: NEGATIVE
Glucose, UA: NEGATIVE mg/dL
Hgb urine dipstick: NEGATIVE
Ketones, ur: NEGATIVE mg/dL
Leukocytes,Ua: NEGATIVE
Nitrite: POSITIVE — AB
Protein, ur: NEGATIVE mg/dL
Specific Gravity, Urine: 1.015 (ref 1.005–1.030)
Squamous Epithelial / HPF: NONE SEEN (ref 0–5)
pH: 5 (ref 5.0–8.0)

## 2021-03-11 LAB — BLOOD GAS, ARTERIAL
Acid-Base Excess: 3 mmol/L — ABNORMAL HIGH (ref 0.0–2.0)
Bicarbonate: 27 mmol/L (ref 20.0–28.0)
FIO2: 0.36
O2 Saturation: 96.7 %
Patient temperature: 37
pCO2 arterial: 38 mmHg (ref 32.0–48.0)
pH, Arterial: 7.46 — ABNORMAL HIGH (ref 7.350–7.450)
pO2, Arterial: 83 mmHg (ref 83.0–108.0)

## 2021-03-11 LAB — RESP PANEL BY RT-PCR (FLU A&B, COVID) ARPGX2
Influenza A by PCR: NEGATIVE
Influenza B by PCR: NEGATIVE
SARS Coronavirus 2 by RT PCR: NEGATIVE

## 2021-03-11 LAB — COMPREHENSIVE METABOLIC PANEL
ALT: 12 U/L (ref 0–44)
AST: 19 U/L (ref 15–41)
Albumin: 3.2 g/dL — ABNORMAL LOW (ref 3.5–5.0)
Alkaline Phosphatase: 74 U/L (ref 38–126)
Anion gap: 10 (ref 5–15)
BUN: 23 mg/dL (ref 8–23)
CO2: 23 mmol/L (ref 22–32)
Calcium: 8.9 mg/dL (ref 8.9–10.3)
Chloride: 104 mmol/L (ref 98–111)
Creatinine, Ser: 1.25 mg/dL — ABNORMAL HIGH (ref 0.44–1.00)
GFR, Estimated: 40 mL/min — ABNORMAL LOW (ref >60)
Glucose, Bld: 111 mg/dL — ABNORMAL HIGH (ref 70–99)
Potassium: 4 mmol/L (ref 3.5–5.1)
Sodium: 137 mmol/L (ref 135–145)
Total Bilirubin: 0.7 mg/dL (ref 0.3–1.2)
Total Protein: 6.4 g/dL — ABNORMAL LOW (ref 6.5–8.1)

## 2021-03-11 LAB — TROPONIN I (HIGH SENSITIVITY): Troponin I (High Sensitivity): 14 ng/L (ref <18)

## 2021-03-11 LAB — LACTIC ACID, PLASMA
Lactic Acid, Venous: 2 mmol/L (ref 0.5–1.9)
Lactic Acid, Venous: 1.6 mmol/L (ref 0.5–1.9)

## 2021-03-11 LAB — PROTIME-INR
INR: 1.1 (ref 0.8–1.2)
Prothrombin Time: 14.2 seconds (ref 11.4–15.2)

## 2021-03-11 LAB — PROCALCITONIN: Procalcitonin: UNDETERMINED ng/mL

## 2021-03-11 LAB — APTT: aPTT: 28 seconds (ref 24–36)

## 2021-03-11 MED ORDER — ALBUTEROL SULFATE (2.5 MG/3ML) 0.083% IN NEBU: 2.5000 mg | INHALATION_SOLUTION | RESPIRATORY_TRACT | Status: DC | PRN

## 2021-03-11 MED ORDER — LACTATED RINGERS IV SOLN: INTRAVENOUS | Status: DC

## 2021-03-11 MED ORDER — ACETAMINOPHEN 325 MG PO TABS
650.0000 mg | ORAL_TABLET | Freq: Four times a day (QID) | ORAL | Status: DC | PRN
  Administered 2021-03-11 – 2021-03-15 (×4): 650 mg via ORAL
  Filled 2021-03-11 (×3): qty 2

## 2021-03-11 MED ORDER — ACETAMINOPHEN 650 MG RE SUPP: 650.0000 mg | Freq: Four times a day (QID) | RECTAL | Status: DC | PRN

## 2021-03-11 MED ORDER — ONDANSETRON HCL 4 MG/2ML IJ SOLN: 4.0000 mg | Freq: Three times a day (TID) | INTRAMUSCULAR | Status: DC | PRN

## 2021-03-11 MED ORDER — VITAMIN B-12 1000 MCG PO TABS
1000.0000 ug | ORAL_TABLET | Freq: Every day | ORAL | Status: DC
  Administered 2021-03-11 – 2021-03-20 (×10): 1000 ug via ORAL
  Filled 2021-03-11 (×11): qty 1

## 2021-03-11 MED ORDER — SODIUM CHLORIDE 0.9 % IV SOLN
2.0000 g | Freq: Once | INTRAVENOUS | Status: AC
  Administered 2021-03-11: 2 g via INTRAVENOUS
  Filled 2021-03-11: qty 2

## 2021-03-11 MED ORDER — GABAPENTIN 100 MG PO CAPS
100.0000 mg | ORAL_CAPSULE | Freq: Every day | ORAL | Status: DC
  Administered 2021-03-11 – 2021-03-19 (×9): 100 mg via ORAL
  Filled 2021-03-11 (×9): qty 1

## 2021-03-11 MED ORDER — PREDNISONE 20 MG PO TABS
20.0000 mg | ORAL_TABLET | Freq: Every day | ORAL | Status: DC
  Administered 2021-03-12 – 2021-03-20 (×9): 20 mg via ORAL
  Filled 2021-03-11 (×9): qty 1

## 2021-03-11 MED ORDER — SODIUM CHLORIDE 0.9 % IV SOLN
2.0000 g | INTRAVENOUS | Status: DC
  Administered 2021-03-12 – 2021-03-15 (×4): 2 g via INTRAVENOUS
  Filled 2021-03-11 (×4): qty 2

## 2021-03-11 MED ORDER — MELATONIN 5 MG PO TABS
10.0000 mg | ORAL_TABLET | Freq: Every day | ORAL | Status: DC
  Administered 2021-03-11 – 2021-03-12 (×2): 10 mg via ORAL
  Filled 2021-03-11 (×2): qty 2

## 2021-03-11 MED ORDER — SENNA 8.6 MG PO TABS
2.0000 | ORAL_TABLET | Freq: Every day | ORAL | Status: DC
  Administered 2021-03-11 – 2021-03-19 (×9): 17.2 mg via ORAL
  Filled 2021-03-11 (×9): qty 2

## 2021-03-11 MED ORDER — TRAVOPROST (BAK FREE) 0.004 % OP SOLN
1.0000 [drp] | Freq: Every day | OPHTHALMIC | Status: DC
  Administered 2021-03-11 – 2021-03-19 (×9): 1 [drp] via OPHTHALMIC
  Filled 2021-03-11 (×3): qty 2.5

## 2021-03-11 MED ORDER — ACETAMINOPHEN 500 MG PO TABS
1000.0000 mg | ORAL_TABLET | Freq: Once | ORAL | Status: AC
  Administered 2021-03-11: 1000 mg via ORAL
  Filled 2021-03-11: qty 2

## 2021-03-11 MED ORDER — VANCOMYCIN HCL IN DEXTROSE 1-5 GM/200ML-% IV SOLN: 1000.0000 mg | Freq: Once | INTRAVENOUS | Status: DC

## 2021-03-11 MED ORDER — METRONIDAZOLE 500 MG/100ML IV SOLN
500.0000 mg | Freq: Once | INTRAVENOUS | Status: AC
  Administered 2021-03-11: 500 mg via INTRAVENOUS
  Filled 2021-03-11: qty 100

## 2021-03-11 MED ORDER — ENOXAPARIN SODIUM 40 MG/0.4ML IJ SOSY
40.0000 mg | PREFILLED_SYRINGE | INTRAMUSCULAR | Status: DC
  Administered 2021-03-11 – 2021-03-19 (×9): 40 mg via SUBCUTANEOUS
  Filled 2021-03-11 (×9): qty 0.4

## 2021-03-11 MED ORDER — ONDANSETRON HCL 4 MG/2ML IJ SOLN
4.0000 mg | Freq: Once | INTRAMUSCULAR | Status: AC
  Administered 2021-03-11: 4 mg via INTRAVENOUS
  Filled 2021-03-11: qty 2

## 2021-03-11 MED ORDER — HYDRALAZINE HCL 20 MG/ML IJ SOLN: 5.0000 mg | INTRAMUSCULAR | Status: DC | PRN

## 2021-03-11 MED ORDER — DM-GUAIFENESIN ER 30-600 MG PO TB12: 1.0000 | ORAL_TABLET | Freq: Two times a day (BID) | ORAL | Status: DC | PRN

## 2021-03-11 MED ORDER — LACTATED RINGERS IV BOLUS
2500.0000 mL | Freq: Once | INTRAVENOUS | Status: AC
  Administered 2021-03-11: 2500 mL via INTRAVENOUS

## 2021-03-11 MED ORDER — MOMETASONE FURO-FORMOTEROL FUM 100-5 MCG/ACT IN AERO
2.0000 | INHALATION_SPRAY | Freq: Two times a day (BID) | RESPIRATORY_TRACT | Status: DC
  Administered 2021-03-11 – 2021-03-20 (×17): 2 via RESPIRATORY_TRACT
  Filled 2021-03-11 (×2): qty 8.8

## 2021-03-11 MED ORDER — OXYCODONE HCL 5 MG PO TABS
5.0000 mg | ORAL_TABLET | Freq: Two times a day (BID) | ORAL | Status: DC
  Administered 2021-03-11 – 2021-03-20 (×19): 5 mg via ORAL
  Filled 2021-03-11 (×19): qty 1

## 2021-03-11 MED ORDER — IPRATROPIUM-ALBUTEROL 0.5-2.5 (3) MG/3ML IN SOLN
3.0000 mL | RESPIRATORY_TRACT | Status: DC
  Administered 2021-03-11 – 2021-03-12 (×7): 3 mL via RESPIRATORY_TRACT
  Filled 2021-03-11 (×7): qty 3

## 2021-03-11 MED ORDER — CALCIUM CARBONATE-VITAMIN D 500-200 MG-UNIT PO TABS
1.0000 | ORAL_TABLET | Freq: Every day | ORAL | Status: DC
  Administered 2021-03-11 – 2021-03-20 (×10): 1 via ORAL
  Filled 2021-03-11 (×11): qty 1

## 2021-03-11 MED ORDER — VANCOMYCIN HCL 1500 MG/300ML IV SOLN
1500.0000 mg | Freq: Once | INTRAVENOUS | Status: AC
  Administered 2021-03-11: 1500 mg via INTRAVENOUS
  Filled 2021-03-11: qty 300

## 2021-03-11 MED ORDER — MIRTAZAPINE 15 MG PO TBDP
15.0000 mg | ORAL_TABLET | ORAL | Status: DC
  Administered 2021-03-11 – 2021-03-12 (×2): 15 mg via ORAL
  Filled 2021-03-11 (×2): qty 1

## 2021-03-11 MED ORDER — BRIMONIDINE TARTRATE 0.15 % OP SOLN
1.0000 [drp] | Freq: Two times a day (BID) | OPHTHALMIC | Status: DC
  Administered 2021-03-11 – 2021-03-20 (×18): 1 [drp] via OPHTHALMIC
  Filled 2021-03-11: qty 5

## 2021-03-11 MED ORDER — VANCOMYCIN HCL 1250 MG/250ML IV SOLN
1250.0000 mg | INTRAVENOUS | Status: DC
  Administered 2021-03-13 – 2021-03-15 (×2): 1250 mg via INTRAVENOUS
  Filled 2021-03-11 (×2): qty 250

## 2021-03-11 MED ORDER — TRAZODONE HCL 50 MG PO TABS
50.0000 mg | ORAL_TABLET | Freq: Every day | ORAL | Status: DC
  Administered 2021-03-12: 50 mg via ORAL
  Filled 2021-03-11: qty 1

## 2021-03-11 MED ORDER — SERTRALINE HCL 50 MG PO TABS
150.0000 mg | ORAL_TABLET | Freq: Every day | ORAL | Status: DC
  Administered 2021-03-11 – 2021-03-20 (×10): 150 mg via ORAL
  Filled 2021-03-11 (×10): qty 3

## 2021-03-11 MED ORDER — VITAMIN D3 25 MCG (1000 UNIT) PO TABS
1000.0000 [IU] | ORAL_TABLET | Freq: Every day | ORAL | Status: DC
  Administered 2021-03-11 – 2021-03-20 (×10): 1000 [IU] via ORAL
  Filled 2021-03-11 (×19): qty 1

## 2021-03-11 NOTE — ED Notes (Signed)
Patient given pillows and repositioned in bed with help of daughter.

## 2021-03-11 NOTE — Progress Notes (Signed)
CODE SEPSIS - PHARMACY COMMUNICATION  **Broad Spectrum Antibiotics should be administered within 1 hour of Sepsis diagnosis**  Time Code Sepsis Called/Page Received: 3825  Antibiotics Ordered: Cefepime, Flagyl, Vancomycin  Time of 1st antibiotic administration: Craig Beach, PharmD, [facility] 03/11/2021 5:44 AM

## 2021-03-11 NOTE — ED Notes (Signed)
Patient eating meal tray with assistance of daughter.

## 2021-03-11 NOTE — ED Notes (Signed)
Daughter reports pt started having cough, congestion, vomiting, fever since yesterday. Reports pt has had covid, flu vaccines

## 2021-03-11 NOTE — H&P (Addendum)
History and Physical    [name] ZOX:[id number] DOB: 05-21-1926 DOA: 03/11/2021  Referring MD/NP/PA:   PCP: [facility], [place]   Patient coming from:  The patient is coming from home.      Chief Complaint: Cough, shortness of breath, fever, chills  HPI: [name] is a 87 y.o. female with medical history significant of hypertension, hyperlipidemia, GERD, depression with anxiety, dementia, uterine cancer (s/p for hysterectomy, OSA not on CPAP, right bundle blockade, vertigo, chronic pain syndrome, anemia, pulmonary hypertension, rhabdomyolysis, anemia, who presents with cough, shortness of breath, fever and chills.  Per her daughter, patient symptoms started yesterday, including fever, chills, cough with little mucus production, shortness of breath.  Patient seems to be more confused than her baseline and agitated.  Patient does not seem to have chest pain.  Patient has nausea and vomited once per her daughter, currently no active nausea, vomiting, diarrhea or abdominal pain.  No symptoms of UTI. Per her daughter, at normal baseline patient recognizes family members, but is not orientated to time and place.  Patient was found to have oxygen desaturated to 85-87% on room air, which improved to 99% on 4 L oxygen.  ED Course: pt was found to have WBC 13.6, lactic acid 2.0, INR 1.1, PTT 28, troponin level 14, BNP 381, negative COVID PCR, urinalysis (hazy appearance, negative leukocyte, positive nitrite, few bacteria, WBC 11-20), AKI with a creatinine 1.25, BUN 23 (creatinine 0.78 on 01/16/2021), temperature 102.1, blood pressure 125/60, heart rate 80, RR 22.  Chest x-ray showed increased bilateral basilar opacity.  CT head is negative for acute intracranial abnormalities.  ABG with pH 7.46, CO2 38, O2 83. Patient is admitted to progressive bed as inpatient  Review of Systems: Could not be reviewed due to dementia.   Allergy:  Allergies  Allergen  Reactions  . Ambien [Zolpidem Tartrate] Other (See Comments)    "hallucination"  . Codeine Itching    Past Medical History:  Diagnosis Date  . Anxiety   . Arthritis   . Cancer [facility])    uterine and questionable vulvar, s/p hysterectomy  . Chronic headaches   . Cystocele   . Dementia ([place])    able to sign own papers  . Depression   . GERD (gastroesophageal reflux disease)   . Glaucoma   . Hypercholesterolemia   . Hypertension   . Macular degeneration   . Reactive airway disease   . Rectocele   . Right bundle branch block   . Sleep apnea   . Uterine cancer ([place])   . Vertigo    none - several yrs  . Wears dentures    full upper and lower    Past Surgical History:  Procedure Laterality Date  . ABDOMINAL HYSTERECTOMY    . ABDOMINAL HYSTERECTOMY    . APPENDECTOMY    . CHOLECYSTECTOMY    . IMAGE GUIDED SINUS SURGERY N/A 06/20/2015   Procedure: IMAGE GUIDED SINUS SURGERY;  Surgeon: Clyde Canterbury, MD;  Location: [place];  Service: ENT;  Laterality: N/A;  GAVE DISK TO CE CE  . REPLACEMENT TOTAL KNEE Left   . SPHENOIDECTOMY Bilateral 06/20/2015   Procedure: SPHENOIDECTOMY;  Surgeon: Clyde Canterbury, MD;  Location: [place];  Service: ENT;  Laterality: Bilateral;  . uterine cancer    . VULVECTOMY      Social History:  reports that she has never smoked. She has never used smokeless tobacco. She reports that she does not  drink alcohol and does not use drugs.  Family History:  Family History  Problem Relation Age of Onset  . Heart disease Mother   . Cancer Father        leukemia  . Heart disease Brother        x2  . Cancer Daughter        breast  . Kidney cancer Neg Hx   . Bladder Cancer Neg Hx      Prior to Admission medications   Medication Sig Start Date End Date Taking? Authorizing Provider  brimonidine (ALPHAGAN P) 0.1 % SOLN Place 1 drop into both eyes 2 (two) times daily.   Yes [provider]  budesonide-formoterol (SYMBICORT)  80-4.5 MCG/ACT inhaler Inhale 2 puffs into the lungs 2 (two) times daily.   Yes [provider]  Calcium Carbonate-Vitamin D3 600-400 MG-UNIT TABS Take 1 tablet by mouth daily.   Yes [provider]  cholecalciferol (VITAMIN D) 25 MCG (1000 UNIT) tablet Take 1,000 Units by mouth daily. 04/17/20  Yes [provider]  gabapentin (NEURONTIN) 100 MG capsule Take 100 mg by mouth at bedtime.   Yes [provider]  lisinopril (ZESTRIL) 5 MG tablet Take 5 mg by mouth daily.   Yes [provider]  Melatonin 10 MG TABS Take 10 mg by mouth at bedtime.   Yes [provider]  mirtazapine (REMERON SOL-TAB) 15 MG disintegrating tablet Take 1 tablet (15 mg total) by mouth daily. Husband wants pt's mirtazapine to be given at 4 pm daily. 01/17/21  Yes Enzo Bi, MD  oxyCODONE (OXY IR/ROXICODONE) 5 MG immediate release tablet Take 5 mg by mouth 2 (two) times daily.   Yes [provider]  predniSONE (DELTASONE) 10 MG tablet Take 10 mg by mouth daily with breakfast.   Yes [provider]  senna (SENOKOT) 8.6 MG TABS tablet Take 2 tablets by mouth at bedtime.   Yes [provider]  sertraline (ZOLOFT) 50 MG tablet Take 150 mg by mouth daily.    Yes [provider]  travoprost, benzalkonium, (TRAVATAN) 0.004 % ophthalmic solution Place 1 drop into both eyes at bedtime.   Yes [provider]  traZODone (DESYREL) 50 MG tablet Take 50 mg by mouth at bedtime.   Yes [provider]  vitamin B-12 (CYANOCOBALAMIN) 1000 MCG tablet Take 1,000 mcg by mouth daily. 04/17/20  Yes [provider]    Physical Exam: Vitals:   03/11/21 1120 03/11/21 1400 03/11/21 1540 03/11/21 1720  BP: (!) 108/54 (!) 100/39 (!) 115/49 (!) 101/43  Pulse: 73 79 71 90  Resp: 20 17 (!) 22 (!) 23  Temp:      TempSrc:      SpO2: 96% 100% 98% 93%  Weight:      Height:       General: Not in acute distress HEENT:       Eyes: PERRL, EOMI,  no scleral icterus.       ENT: No discharge from the ears and nose.       Neck: No JVD, no bruit, no mass felt. Heme: No neck lymph node enlargement. Cardiac: S1/S2, RRR, No murmurs, No gallops or rubs. Respiratory: Has coarse breathing sound bilaterally GI: Soft, nondistended, nontender, no organomegaly, BS present. GU: No hematuria Ext: No pitting leg edema bilaterally. 1+DP/PT pulse bilaterally. Musculoskeletal: No joint deformities, No joint redness or warmth, no limitation of ROM in spin. Skin: No rashes.  Neuro: Confused, not oriented oriented X3, cranial nerves  II-XII grossly intact, moves all extremities  Psych: Patient is not psychotic, no suicidal or hemocidal ideation.  Labs on Admission: I have personally reviewed following labs and imaging studies  CBC: Recent Labs  Lab 03/11/21 0600  WBC 13.6*  NEUTROABS 11.5*  HGB 10.5*  HCT 33.3*  MCV 95.4  PLT 614   Basic Metabolic Panel: Recent Labs  Lab 03/11/21 0600  NA 137  K 4.0  CL 104  CO2 23  GLUCOSE 111*  BUN 23  CREATININE 1.25*  CALCIUM 8.9   GFR: Estimated Creatinine Clearance: 28.6 mL/min (A) (by C-G formula based on SCr of 1.25 mg/dL (H)). Liver Function Tests: Recent Labs  Lab 03/11/21 0600  AST 19  ALT 12  ALKPHOS 74  BILITOT 0.7  PROT 6.4*  ALBUMIN 3.2*   No results for input(s): LIPASE, AMYLASE in the last 168 hours. No results for input(s): AMMONIA in the last 168 hours. Coagulation Profile: Recent Labs  Lab 03/11/21 0600  INR 1.1   Cardiac Enzymes: No results for input(s): CKTOTAL, CKMB, CKMBINDEX, TROPONINI in the last 168 hours. BNP (last 3 results) No results for input(s): PROBNP in the last 8760 hours. HbA1C: No results for input(s): HGBA1C in the last 72 hours. CBG: No results for input(s): GLUCAP in the last 168 hours. Lipid Profile: No results for input(s): CHOL, HDL, LDLCALC, TRIG, CHOLHDL, LDLDIRECT in the last 72 hours. Thyroid Function Tests: No results for  input(s): TSH, T4TOTAL, FREET4, T3FREE, THYROIDAB in the last 72 hours. Anemia Panel: No results for input(s): VITAMINB12, FOLATE, FERRITIN, TIBC, IRON, RETICCTPCT in the last 72 hours. Urine analysis:    Component Value Date/Time   COLORURINE YELLOW (A) 03/11/2021 0600   APPEARANCEUR HAZY (A) 03/11/2021 0600   APPEARANCEUR Hazy (A) 12/10/2016 1341   LABSPEC 1.015 03/11/2021 0600   PHURINE 5.0 03/11/2021 0600   GLUCOSEU NEGATIVE 03/11/2021 0600   GLUCOSEU NEGATIVE 01/23/2017 1207   HGBUR NEGATIVE 03/11/2021 0600   BILIRUBINUR NEGATIVE 03/11/2021 0600   BILIRUBINUR Negative 12/10/2016 1341   KETONESUR NEGATIVE 03/11/2021 0600   PROTEINUR NEGATIVE 03/11/2021 0600   UROBILINOGEN 0.2 01/23/2017 1207   NITRITE POSITIVE (A) 03/11/2021 0600   LEUKOCYTESUR NEGATIVE 03/11/2021 0600   Sepsis Labs: @LABRCNTIP (procalcitonin:4,lacticidven:4) ) Recent Results (from the past 240 hour(s))  Resp Panel by RT-PCR (Flu A&B, Covid) Nasopharyngeal Swab     Status: None   Collection Time: 03/11/21  6:00 AM   Specimen: Nasopharyngeal Swab; Nasopharyngeal(NP) swabs in vial transport medium  Result Value Ref Range Status   SARS Coronavirus 2 by RT PCR NEGATIVE NEGATIVE Final    Comment: (NOTE) SARS-CoV-2 target nucleic acids are NOT DETECTED.  The SARS-CoV-2 RNA is generally detectable in upper respiratory specimens during the acute phase of infection. The lowest concentration of SARS-CoV-2 viral copies this assay can detect is 138 copies/mL. A negative result does not preclude SARS-Cov-2 infection and should not be used as the sole basis for treatment or other patient management decisions. A negative result may occur with  improper specimen collection/handling, submission of specimen other than nasopharyngeal swab, presence of viral mutation(s) within the areas targeted by this assay, and inadequate number of viral copies(<138 copies/mL). A negative result must be combined with clinical  observations, patient history, and epidemiological information. The expected result is Negative.  Fact Sheet for Patients:  EntrepreneurPulse.com.au  Fact Sheet for Healthcare Providers:  IncredibleEmployment.be  This test is no t yet approved or cleared by the [place] and  has been authorized  for detection and/or diagnosis of SARS-CoV-2 by FDA under an Emergency Use Authorization (EUA). This EUA will remain  in effect (meaning this test can be used) for the duration of the COVID-19 declaration under Section 564(b)(1) of the Act, 21 U.S.C.section 360bbb-3(b)(1), unless the authorization is terminated  or revoked sooner.       Influenza A by PCR NEGATIVE NEGATIVE Final   Influenza B by PCR NEGATIVE NEGATIVE Final    Comment: (NOTE) The Xpert Xpress SARS-CoV-2/FLU/RSV plus assay is intended as an aid in the diagnosis of influenza from Nasopharyngeal swab specimens and should not be used as a sole basis for treatment. Nasal washings and aspirates are unacceptable for Xpert Xpress SARS-CoV-2/FLU/RSV testing.  Fact Sheet for Patients: EntrepreneurPulse.com.au  Fact Sheet for Healthcare Providers: IncredibleEmployment.be  This test is not yet approved or cleared by the [place] FDA and has been authorized for detection and/or diagnosis of SARS-CoV-2 by FDA under an Emergency Use Authorization (EUA). This EUA will remain in effect (meaning this test can be used) for the duration of the COVID-19 declaration under Section 564(b)(1) of the Act, 21 U.S.C. section 360bbb-3(b)(1), unless the authorization is terminated or revoked.  Performed at [facility], [address] [place], [place] [address]   Blood Culture (routine x 2)     Status: None (Preliminary result)   Collection Time: 03/11/21  6:01 AM   Specimen: BLOOD  Result Value Ref Range Status   Specimen Description BLOOD LEFT  ARM  Final   Special Requests   Final    BOTTLES DRAWN AEROBIC AND ANAEROBIC Blood Culture adequate volume   Culture   Final    NO GROWTH <12 HOURS Performed at [facility], [address] [place], [place] [address]    Report Status PENDING  Incomplete  Blood Culture (routine x 2)     Status: None (Preliminary result)   Collection Time: 03/11/21  6:02 AM   Specimen: BLOOD  Result Value Ref Range Status   Specimen Description BLOOD RIGHT ARM  Final   Special Requests   Final    BOTTLES DRAWN AEROBIC AND ANAEROBIC Blood Culture adequate volume   Culture   Final    NO GROWTH <12 HOURS Performed at [facility], [address] [place], [place] [address]    Report Status PENDING  Incomplete     Radiological Exams on Admission: CT Head Wo Contrast  Result Date: 03/11/2021 CLINICAL DATA:  87 year old female with unexplained altered mental status. EXAM: CT HEAD WITHOUT CONTRAST TECHNIQUE: Contiguous axial images were obtained from the base of the skull through the vertex without intravenous contrast. COMPARISON:  Brain MRI and head CT 01/13/2021. FINDINGS: Brain: Cerebral volume is stable and normal for age. No midline shift, ventriculomegaly, mass effect, evidence of mass lesion, intracranial hemorrhage or evidence of cortically based acute infarction. Stable and normal for age gray-white matter differentiation. Incidental large occipital arachnoid granulations, normal variant (series 6, image 30). Vascular: Calcified atherosclerosis at the skull base. No suspicious intracranial vascular hyperdensity. Skull: Stable.  No acute osseous abnormality identified. Sinuses/Orbits: Visualized paranasal sinuses and mastoids are stable and well aerated. Other: No acute orbit or scalp soft tissue finding. IMPRESSION: Stable and normal for age noncontrast Head CT. Electronically Signed   By: Genevie Ann M.D.   On: 03/11/2021 06:38   DG Chest Port 1 View  Result Date: 03/11/2021 CLINICAL  DATA:  87 year old female with possible sepsis. EXAM: PORTABLE CHEST 1 VIEW COMPARISON:  Portable chest  01/13/2021 and earlier. FINDINGS: Portable AP upright view at 0602 hours. Continued low lung volumes. Stable cardiac size and mediastinal contours. Further decreased bibasilar ventilation since April, but more resembles atelectasis than pneumonia. No pneumothorax or pulmonary edema. No definite pleural effusion. Visualized tracheal air column is within normal limits. Paucity of bowel gas in the upper abdomen. Severe bilateral shoulder degeneration. IMPRESSION: Low lung volumes, and increased bibasilar opacity since April but more compatible atelectasis than infection. Electronically Signed   By: Genevie Ann M.D.   On: 03/11/2021 06:18     EKG: I have personally reviewed.  Sinus rhythm, low voltage, QTC 584, early R wave progression, RAD  Assessment/Plan Principal Problem:   HCAP (healthcare-associated pneumonia) Active Problems:   Depression with anxiety   Essential (primary) hypertension   Chronic pain syndrome   Acute metabolic encephalopathy   AKI (acute kidney injury) ([place])   Sepsis (HCC)   Acute respiratory failure with hypoxia (HCC)   UTI (urinary tract infection)   Acute respiratory failure with hypoxia and HCAP (healthcare-associated pneumonia):   -Admitted to progressive unit as inpatient - IV Vancomycin and cefepime (patient received 1 dose of Flagyl in ED) - Mucinex for cough  - Bronchodilators - Urine legionella and S. pneumococcal antigen - Follow up blood culture x2, sputum culture  UTI: -pt is on broad antibiotics as above -Follow-up urine culture  Sepsis due to UTI and HCAP: Patient meets criteria for sepsis with leukocytosis with WBC 13.6, fever of 102, tachypnea with RR 22.  Lactic acid is 2.0.  Hemodynamically stable -IV fluid: 2.5 L LR -will get Procalcitonin and trend lactic acid levels per sepsis protocol.  Depression with anxiety -Continue home  medications  Essential (primary) hypertension -IV hydralazine as needed -Hold lisinopril due to AKI  Chronic pain syndrome -Continue home oxycodone  Acute metabolic encephalopathy: Likely due to ongoing infection and sepsis.  CT head negative for acute intracranial abnormalities -Frequent neuro check  AKI: Creatinine 1.25, BUN 23.  Baseline creatinine 0.78 on 01/16/2021.  Likely due to UTI -IV fluid as above    DVT pp: SQ Lovenox Code Status: DNR per her daughter Family Communication:    Yes, patient's daughter    at bed side. I also discussed assessment and plan with her PCP, Dr. Daphene Calamity [phone] Disposition Plan:  Anticipate discharge back to previous environment Consults called:  none Admission status and Level of care: Progressive Cardiac:  as inpt      Status is: Inpatient.  is appropriate for inpatient admission due to multiple acute issues, including sepsis, pneumonia, UTI and AKI.  Dispo: The patient is from: Home              Anticipated d/c is to: Home              Patient currently is not medically stable to d/c.   Difficult to place patient No          Date of Service 03/11/2021    [facility]   If 7PM-7AM, please contact night-coverage [email or web address] 03/11/2021, 5:39 PM

## 2021-03-11 NOTE — Progress Notes (Signed)
PHARMACY -  BRIEF ANTIBIOTIC NOTE   Pharmacy has received consult(s) for Cefepime and Vancomycin from an ED provider.  The patient's profile has been reviewed for ht/wt/allergies/indication/available labs.    One time order(s) placed for Cefepime 2 gm and Vancomycin 1500 mg per pt weight of ~ 82 kg per echocardiogram note from 01/16/21  Further antibiotics/pharmacy consults should be ordered by admitting physician if indicated.                       Renda Rolls, PharmD, [facility] 03/11/2021 5:49 AM

## 2021-03-11 NOTE — Plan of Care (Signed)
Patient admitted to unit. Alert to self only. Unable to walk at this time. VSS. On 6L [place].  Problem: Education: Goal: Knowledge of General Education information will improve Description: Including pain rating scale, medication(s)/side effects and non-pharmacologic comfort measures Outcome: Progressing   Problem: Health Behavior/Discharge Planning: Goal: Ability to manage health-related needs will improve Outcome: Progressing   Problem: Clinical Measurements: Goal: Ability to maintain clinical measurements within normal limits will improve Outcome: Progressing Goal: Will remain free from infection Outcome: Progressing Goal: Diagnostic test results will improve Outcome: Progressing Goal: Respiratory complications will improve Outcome: Progressing Goal: Cardiovascular complication will be avoided Outcome: Progressing   Problem: Activity: Goal: Risk for activity intolerance will decrease Outcome: Progressing   Problem: Nutrition: Goal: Adequate nutrition will be maintained Outcome: Progressing   Problem: Coping: Goal: Level of anxiety will decrease Outcome: Progressing   Problem: Elimination: Goal: Will not experience complications related to bowel motility Outcome: Progressing Goal: Will not experience complications related to urinary retention Outcome: Progressing   Problem: Pain Managment: Goal: General experience of comfort will improve Outcome: Progressing   Problem: Safety: Goal: Ability to remain free from injury will improve Outcome: Progressing   Problem: Skin Integrity: Goal: Risk for impaired skin integrity will decrease Outcome: Progressing

## 2021-03-11 NOTE — Consult Note (Signed)
Pharmacy Antibiotic Note  [name] is a 87 y.o. female admitted on 03/11/2021 with HCAP.  Pharmacy has been consulted for cefepime/vancomcyin dosing.  Plan: Pt is receiving 1500 mg loading dose vancomycin in ED  Will follow with  Vancomycin 1250 mg IV Q 48 hrs.  Goal AUC 400-550. Expected AUC: 459 SCr used: 1.25  Will order MRSA PCR to potentiate de-escalation of broad spectrum coverage  Will start Cefepime 2g q24h  Height: 5\' 4"  (162.6 cm) Weight: 75.7 kg (166 lb 12.8 oz) IBW/kg (Calculated) : 54.7  Temp (24hrs), Avg:100.5 F (38.1 C), Min:98.8 F (37.1 C), Max:102.1 F (38.9 C)  Recent Labs  Lab 03/11/21 0600  WBC 13.6*  CREATININE 1.25*  LATICACIDVEN 2.0*    Estimated Creatinine Clearance: 28.6 mL/min (A) (by C-G formula based on SCr of 1.25 mg/dL (H)).    Allergies  Allergen Reactions  . Ambien [Zolpidem Tartrate] Other (See Comments)    "hallucination"  . Codeine Itching    Antimicrobials this admission: Metronidazole 6/5 x 1  Vancomcyin 6/5 >> Cefepime 6/5 >>  Dose adjustments this admission: none  Microbiology results: 6/5 BCx: pedning 6/5 UCx: pending  6/5 Sputum: pending  6/5 MRSA PCR: pending  Thank you for allowing pharmacy to be a part of this patient's care.  Lu Duffel, PharmD, BCPS Clinical Pharmacist 03/11/2021 7:46 AM

## 2021-03-11 NOTE — ED Triage Notes (Signed)
Pt from home via ems, c/o cough/congestion/tmax 101.4. goes to pace 5 days/wk and ? Uti? EMS states RA sat 85-87%. No meds given en route or at home per ems. Pt hx dementia, oriented to person only and has some incomprehensible speech, moves all extremities spontaneously

## 2021-03-11 NOTE — ED Provider Notes (Signed)
[facility] Emergency Department Provider Note  ____________________________________________   Event Date/Time   First MD Initiated Contact with Patient 03/11/21 [phone]     (approximate)  I have reviewed the triage vital signs and the nursing notes.   HISTORY  Chief Complaint Fever    HPI [name] is a 87 y.o. female with history of dementia, hypertension, right bundle branch block, hyperlipidemia who presents to the emergency department with EMS from home for concerns for cough, fever.  EMS reports sats of 85% on room air.  Does not wear oxygen chronically.  No family at bedside.  Patient unable to answer questions.  Unclear of her baseline.  She has vomit on her shirt.  She is unable to tell us if she has having pain.        Past Medical History:  Diagnosis Date  . Anxiety   . Arthritis   . Cancer [facility])    uterine and questionable vulvar, s/p hysterectomy  . Chronic headaches   . Cystocele   . Dementia ([place])    able to sign own papers  . Depression   . GERD (gastroesophageal reflux disease)   . Glaucoma   . Hypercholesterolemia   . Hypertension   . Macular degeneration   . Reactive airway disease   . Rectocele   . Right bundle branch block   . Sleep apnea   . Uterine cancer ([place])   . Vertigo    none - several yrs  . Wears dentures    full upper and lower    Patient Active Problem List   Diagnosis Date Noted  . Acute renal failure (ARF) ([place]) 01/14/2021  . Acute metabolic encephalopathy 73/53/2992  . Hyperkalemia 01/13/2021  . AKI (acute kidney injury) ([place]) 01/13/2021  . Rhabdomyolysis 01/13/2021  . Macrocytic anemia 01/13/2021  . Osteoarthritis of glenohumeral joint (Severe) (Bilateral) 08/26/2018  . Osteoarthritis of AC (acromioclavicular) joint (Severe) (Bilateral) 08/26/2018  . Chronic acromioclavicular [facility]) joint pain (Bilateral) 08/26/2018  . Spondylolisthesis, cervical region (Multilevel) 08/13/2018  .  Spondylolisthesis, cervicothoracic region (Multilevel) 08/13/2018  . Cervical foraminal stenosis (Severe C4-5 through C6-7) 08/13/2018  . Cervical radiculitis (Bilateral) 08/13/2018  . Cervical  Grade 1 Anterolisthesis of C4/5, C5/6, & C7/T1 08/13/2018  . Cervical facet arthropathy (Severe) (Right: C4-5, C5-6) 08/13/2018  . Cervical facet syndrome (Bilateral) (R>L) 08/13/2018  . Cervical radiculopathy due to osteoarthritis of spine 08/13/2018  . Hx of total knee replacement (Left) 07/27/2018  . DDD (degenerative disc disease), cervical 07/27/2018  . Chronic shoulder pain (Primary Area of Pain) (Bilateral) (R>L) 07/14/2018  . Chronic knee pain (Secondary Area of Pain) (Right) 07/14/2018  . Chronic hip pain [facility] Pain) (Bilateral) (R>L) 07/14/2018  . Chronic pain syndrome 07/14/2018  . Pharmacologic therapy 07/14/2018  . Disorder of skeletal system 07/14/2018  . Problems influencing health status 07/14/2018  . Hyperlipidemia 03/02/2016  . Dementia ([place]) 03/02/2016  . Vertigo 03/01/2016  . Frequent falls 12/04/2014  . Hyperglycemia 12/04/2014  . Health care maintenance 12/04/2014  . Encounter for general adult medical examination without abnormal findings 12/04/2014  . URI (upper respiratory infection) 10/24/2014  . OAB (overactive bladder) 06/17/2014  . Pain of left thumb 05/31/2014  . Pain of finger of left hand 05/31/2014  . Rectocele 03/17/2014  . Vaginal atrophy 03/17/2014  . Cough 03/14/2014  . Bladder prolapse, female, acquired 02/15/2014  . Mixed stress and urge urinary incontinence 02/15/2014  . Urethral prolapse 02/15/2014  . Dysuria 02/15/2014  .  Elevated alkaline phosphatase level 01/30/2014  . Abnormal levels of other serum enzymes 01/30/2014  . Frequency of micturition 12/24/2013  . Cervicalgia 12/12/2013  . Skin lesion of breast 12/12/2013  . Disorder of breast 12/12/2013  . Loss of vision 10/11/2013  . Vision loss, left eye 10/11/2013  . Chest pain  10/11/2013  . DOE (dyspnea on exertion) 10/11/2013  . Anxiety disorder 10/11/2013  . Other forms of dyspnea 10/11/2013  . 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 10/11/2013  . Visual loss 10/11/2013  . Cervicogenic headache 02/28/2013  . History of frequent urinary tract infections 02/01/2013  . Hypercholesterolemia 02/01/2013  . Pulmonary hypertension ([place]) 02/01/2013  . Major depressive disorder, single episode 02/01/2013  . Sleep disorder 02/01/2013  . Personal history of urinary infection 02/01/2013  . Pure hypercholesterolemia 02/01/2013  . Other secondary pulmonary hypertension ([place]) 02/01/2013  . GERD (gastroesophageal reflux disease) 12/14/2012  . Chronic headaches 12/14/2012  . Osteoarthritis of shoulders (Bilateral) 12/14/2012  . Depression with anxiety 12/14/2012  . Dizziness 09/10/2012  . Essential hypertension, benign 09/10/2012  . Essential (primary) hypertension 09/10/2012    Past Surgical History:  Procedure Laterality Date  . ABDOMINAL HYSTERECTOMY    . ABDOMINAL HYSTERECTOMY    . APPENDECTOMY    . CHOLECYSTECTOMY    . IMAGE GUIDED SINUS SURGERY N/A 06/20/2015   Procedure: IMAGE GUIDED SINUS SURGERY;  Surgeon: Clyde Canterbury, MD;  Location: [place];  Service: ENT;  Laterality: N/A;  GAVE DISK TO CE CE  . REPLACEMENT TOTAL KNEE Left   . SPHENOIDECTOMY Bilateral 06/20/2015   Procedure: SPHENOIDECTOMY;  Surgeon: Clyde Canterbury, MD;  Location: [place];  Service: ENT;  Laterality: Bilateral;  . uterine cancer    . VULVECTOMY      Prior to Admission medications   Medication Sig Start Date End Date Taking? Authorizing Provider  brimonidine (ALPHAGAN P) 0.1 % SOLN Place 1 drop into both eyes 2 (two) times daily.   Yes [provider]  budesonide-formoterol (SYMBICORT) 80-4.5 MCG/ACT inhaler Inhale 2 puffs into the lungs 2 (two) times daily.   Yes [provider]  Calcium Carbonate-Vitamin D3  600-400 MG-UNIT TABS Take 1 tablet by mouth daily.   Yes [provider]  cholecalciferol (VITAMIN D) 25 MCG (1000 UNIT) tablet Take 1,000 Units by mouth daily. 04/17/20  Yes [provider]  gabapentin (NEURONTIN) 100 MG capsule Take 100 mg by mouth at bedtime.   Yes [provider]  lisinopril (ZESTRIL) 5 MG tablet Take 5 mg by mouth daily.   Yes [provider]  Melatonin 10 MG TABS Take 10 mg by mouth at bedtime.   Yes [provider]  mirtazapine (REMERON SOL-TAB) 15 MG disintegrating tablet Take 1 tablet (15 mg total) by mouth daily. Husband wants pt's mirtazapine to be given at 4 pm daily. 01/17/21  Yes Enzo Bi, MD  oxyCODONE (OXY IR/ROXICODONE) 5 MG immediate release tablet Take 5 mg by mouth 2 (two) times daily.   Yes [provider]  predniSONE (DELTASONE) 10 MG tablet Take 10 mg by mouth daily with breakfast.   Yes [provider]  senna (SENOKOT) 8.6 MG TABS tablet Take 2 tablets by mouth at bedtime.   Yes [provider]  sertraline (ZOLOFT) 50 MG tablet Take 150 mg by mouth daily.    Yes [provider]  travoprost, benzalkonium, (TRAVATAN) 0.004 % ophthalmic solution Place 1 drop into both eyes at bedtime.  Yes [provider]  traZODone (DESYREL) 50 MG tablet Take 50 mg by mouth at bedtime.   Yes [provider]  vitamin B-12 (CYANOCOBALAMIN) 1000 MCG tablet Take 1,000 mcg by mouth daily. 04/17/20  Yes [provider]    Allergies Ambien [zolpidem tartrate] and Codeine  Family History  Problem Relation Age of Onset  . Heart disease Mother   . Cancer Father        leukemia  . Heart disease Brother        x2  . Cancer Daughter        breast  . Kidney cancer Neg Hx   . Bladder Cancer Neg Hx     Social History Social History   Tobacco Use  . Smoking status: Never Smoker  . Smokeless tobacco: Never Used  Substance Use Topics  . Alcohol use: No    Alcohol/week:  0.0 standard drinks  . Drug use: No    Review of Systems Level 5 caveat secondary to altered mental status  ____________________________________________   PHYSICAL EXAM:  VITAL SIGNS: ED Triage Vitals  Enc Vitals Group     BP 03/11/21 0557 (!) 128/53     Pulse Rate 03/11/21 0557 77     Resp 03/11/21 0557 (!) 22     Temp 03/11/21 0557 (!) 102.1 F (38.9 C)     Temp Source 03/11/21 0557 Rectal     SpO2 03/11/21 0557 94 %     Weight 03/11/21 0556 166 lb 12.8 oz (75.7 kg)     Height 03/11/21 0556 5\' 4"  (1.626 m)     Head Circumference --      Peak Flow --      Pain Score --      Pain Loc --      Pain Edu? --      Excl. in [place]? --    CONSTITUTIONAL: Alert but does not answer questions or follow commands.  Elderly.  Febrile.  Mumbles incoherently.  Yellow appearing emesis on her shirt. HEAD: Normocephalic, atraumatic EYES: Conjunctivae clear, pupils appear equal, EOM appear intact ENT: normal nose; moist mucous membranes NECK: Supple, normal ROM CARD: RRR; S1 and S2 appreciated; no murmurs, no clicks, no rubs, no gallops RESP: Patient is slightly tachypneic and hypoxic.  Rhonchorous breath sounds diffusely.  No rales, wheezes.  No respiratory distress. ABD/GI: Normal bowel sounds; non-distended; soft, non-tender, no rebound, no guarding, no peritoneal signs, no hepatosplenomegaly, no sacral decubiti BACK: The back appears normal EXT: Normal ROM in all joints; no deformity noted, no edema; no cyanosis, no calf tenderness or calf swelling SKIN: Normal color for age and race; warm; no rash on exposed skin NEURO: Moves all extremities equally, no facial asymmetry, mumbles incoherently, does not follow commands appropriately or answer questions   ____________________________________________   LABS (all labs ordered are listed, but only abnormal results are displayed)  Labs Reviewed  LACTIC ACID, PLASMA - Abnormal; Notable for the following components:      Result Value    Lactic Acid, Venous 2.0 (*)    All other components within normal limits  COMPREHENSIVE METABOLIC PANEL - Abnormal; Notable for the following components:   Glucose, Bld 111 (*)    Creatinine, Ser 1.25 (*)    Total Protein 6.4 (*)    Albumin 3.2 (*)    GFR, Estimated 40 (*)    All other components within normal limits  CBC WITH DIFFERENTIAL/PLATELET - Abnormal; Notable for the following components:   WBC  13.6 (*)    RBC 3.49 (*)    Hemoglobin 10.5 (*)    HCT 33.3 (*)    Neutro Abs 11.5 (*)    All other components within normal limits  URINALYSIS, COMPLETE (UACMP) WITH MICROSCOPIC - Abnormal; Notable for the following components:   Color, Urine YELLOW (*)    APPearance HAZY (*)    Nitrite POSITIVE (*)    Bacteria, UA FEW (*)    All other components within normal limits  BRAIN NATRIURETIC PEPTIDE - Abnormal; Notable for the following components:   B Natriuretic Peptide 381.5 (*)    All other components within normal limits  BLOOD GAS, ARTERIAL - Abnormal; Notable for the following components:   pH, Arterial 7.46 (*)    Acid-Base Excess 3.0 (*)    All other components within normal limits  RESP PANEL BY RT-PCR (FLU A&B, COVID) ARPGX2  CULTURE, BLOOD (ROUTINE X 2)  CULTURE, BLOOD (ROUTINE X 2)  URINE CULTURE  PROTIME-INR  APTT  PROCALCITONIN  LACTIC ACID, PLASMA  PROCALCITONIN  TROPONIN I (HIGH SENSITIVITY)   ____________________________________________  EKG   Date: 03/11/2021 5:53 AM  Rate: 78  Rhythm: normal sinus rhythm  QRS Axis: normal  Intervals: normal  ST/T Wave abnormalities: normal  Conduction Disutrbances: Right bundle branch block  Narrative Interpretation: Right bundle branch block     ____________________________________________  RADIOLOGY I, Issa Luster, personally viewed and evaluated these images (plain radiographs) as part of my medical decision making, as well as reviewing the written report by the radiologist.  ED MD interpretation: CT head  shows no acute abnormality.  Chest x-ray shows bibasilar opacities.  Official radiology report(s): CT Head Wo Contrast  Result Date: 03/11/2021 CLINICAL DATA:  87 year old female with unexplained altered mental status. EXAM: CT HEAD WITHOUT CONTRAST TECHNIQUE: Contiguous axial images were obtained from the base of the skull through the vertex without intravenous contrast. COMPARISON:  Brain MRI and head CT 01/13/2021. FINDINGS: Brain: Cerebral volume is stable and normal for age. No midline shift, ventriculomegaly, mass effect, evidence of mass lesion, intracranial hemorrhage or evidence of cortically based acute infarction. Stable and normal for age gray-white matter differentiation. Incidental large occipital arachnoid granulations, normal variant (series 6, image 30). Vascular: Calcified atherosclerosis at the skull base. No suspicious intracranial vascular hyperdensity. Skull: Stable.  No acute osseous abnormality identified. Sinuses/Orbits: Visualized paranasal sinuses and mastoids are stable and well aerated. Other: No acute orbit or scalp soft tissue finding. IMPRESSION: Stable and normal for age noncontrast Head CT. Electronically Signed   By: Genevie Ann M.D.   On: 03/11/2021 06:38   DG Chest Port 1 View  Result Date: 03/11/2021 CLINICAL DATA:  87 year old female with possible sepsis. EXAM: PORTABLE CHEST 1 VIEW COMPARISON:  Portable chest 01/13/2021 and earlier. FINDINGS: Portable AP upright view at 0602 hours. Continued low lung volumes. Stable cardiac size and mediastinal contours. Further decreased bibasilar ventilation since April, but more resembles atelectasis than pneumonia. No pneumothorax or pulmonary edema. No definite pleural effusion. Visualized tracheal air column is within normal limits. Paucity of bowel gas in the upper abdomen. Severe bilateral shoulder degeneration. IMPRESSION: Low lung volumes, and increased bibasilar opacity since April but more compatible atelectasis than infection.  Electronically Signed   By: Genevie Ann M.D.   On: 03/11/2021 06:18    ____________________________________________   PROCEDURES  Procedure(s) performed (including Critical Care):  Procedures  CRITICAL CARE Performed by: Cyril Mourning Adonte Vanriper   Total critical care time: 65 minutes  Critical care time  was exclusive of separately billable procedures and treating other patients.  Critical care was necessary to treat or prevent imminent or life-threatening deterioration.  Critical care was time spent personally by me on the following activities: development of treatment plan with patient and/or surrogate as well as nursing, discussions with consultants, evaluation of patient's response to treatment, examination of patient, obtaining history from patient or surrogate, ordering and performing treatments and interventions, ordering and review of laboratory studies, ordering and review of radiographic studies, pulse oximetry and re-evaluation of patient's condition.  ____________________________________________   INITIAL IMPRESSION / ASSESSMENT AND PLAN / ED COURSE  As part of my medical decision making, I reviewed the following data within the [place] History obtained from family, Nursing notes reviewed and incorporated, Labs reviewed , EKG interpreted , Old EKG reviewed, Old chart reviewed, Radiograph reviewed , Discussed with admitting physician  and Notes from prior ED visits         Patient here febrile, tachypneic, hypoxic.  No family currently at bedside.  Will obtain sepsis labs, cultures, chest x-ray, urine, COVID and flu swab.  EMS reports concern for altered mental status as well but no one here to confirm her baseline.  Head CT ordered as well.  No sign of traumatic injury on exam.  Will give broad-spectrum antibiotics and start hydration.  No hypotension currently.  ED PROGRESS  Daughter at bedside.  Reports since yesterday she has had cough, congestion, complaints  of chest discomfort, fever.  Had vomiting at home.  No diarrhea.  Does not wear oxygen chronically.  Is a DNR/DNI.  Lives with her husband and daughter reports she lives about a mile away.  6:35 AM  Pt's lactic minimally elevated at 2.0.  Will order 30 mL/kg IV fluid bolus.  7:00 AM  Pt's COVID and flu test negative.  Chest x-ray concerning for bibasilar opacities.  CT head unremarkable.  Appears also to have a urinary tract infection.  Leukocytosis of 13,000 with left shift.  Will discuss with medicine for admission.  ABG reassuring.  Troponin negative.  7:09 AM Discussed patient's case with hospitalist, Dr. Blaine Hamper.  I have recommended admission and patient (and family if present) agree with this plan. Admitting physician will place admission orders.   I reviewed all nursing notes, vitals, pertinent previous records and reviewed/interpreted all EKGs, lab and urine results, imaging (as available).   ____________________________________________   FINAL CLINICAL IMPRESSION(S) / ED DIAGNOSES  Final diagnoses:  Acute sepsis ([place])  Acute respiratory failure with hypoxia ([facility])  Pneumonia of both lower lobes due to infectious organism  Acute UTI     ED Discharge Orders    None      *Please note:  Jaylyne Breese was evaluated in Emergency Department on 03/11/2021 for the symptoms described in the history of present illness. She was evaluated in the context of the global COVID-19 pandemic, which necessitated consideration that the patient might be at risk for infection with the SARS-CoV-2 virus that causes COVID-19. Institutional protocols and algorithms that pertain to the evaluation of patients at risk for COVID-19 are in a state of rapid change based on information released by regulatory bodies including the CDC and federal and state organizations. These policies and algorithms were followed during the patient's care in the ED.  Some ED evaluations and interventions may be delayed as a result of  limited staffing during and the pandemic.*   Note:  This document was prepared using Systems analyst and  may include unintentional dictation errors.   Makaylia Hewett, Delice Bison, DO 03/11/21 [phone]

## 2021-03-11 NOTE — ED Notes (Signed)
Daughter, Butch Penny phone number is [phone].

## 2021-03-11 NOTE — Progress Notes (Signed)
[facility] following for Sepsis Protocol 

## 2021-03-12 DIAGNOSIS — N3000 Acute cystitis without hematuria: Secondary | ICD-10-CM

## 2021-03-12 LAB — BASIC METABOLIC PANEL
Anion gap: 8 (ref 5–15)
BUN: 22 mg/dL (ref 8–23)
CO2: 24 mmol/L (ref 22–32)
Calcium: 8.5 mg/dL — ABNORMAL LOW (ref 8.9–10.3)
Chloride: 105 mmol/L (ref 98–111)
Creatinine, Ser: 1.2 mg/dL — ABNORMAL HIGH (ref 0.44–1.00)
GFR, Estimated: 42 mL/min — ABNORMAL LOW (ref >60)
Glucose, Bld: 130 mg/dL — ABNORMAL HIGH (ref 70–99)
Potassium: 4.4 mmol/L (ref 3.5–5.1)
Sodium: 137 mmol/L (ref 135–145)

## 2021-03-12 LAB — CBC
HCT: 29 % — ABNORMAL LOW (ref 36.0–46.0)
Hemoglobin: 9.6 g/dL — ABNORMAL LOW (ref 12.0–15.0)
MCH: 31.7 pg (ref 26.0–34.0)
MCHC: 33.1 g/dL (ref 30.0–36.0)
MCV: 95.7 fL (ref 80.0–100.0)
Platelets: 198 10*3/uL (ref 150–400)
RBC: 3.03 MIL/uL — ABNORMAL LOW (ref 3.87–5.11)
RDW: 13.2 % (ref 11.5–15.5)
WBC: 14 10*3/uL — ABNORMAL HIGH (ref 4.0–10.5)
nRBC: 0 % (ref 0.0–0.2)

## 2021-03-12 LAB — STREP PNEUMONIAE URINARY ANTIGEN: Strep Pneumo Urinary Antigen: NEGATIVE

## 2021-03-12 LAB — PROCALCITONIN: Procalcitonin: 2.04 ng/mL

## 2021-03-12 LAB — LACTIC ACID, PLASMA: Lactic Acid, Venous: 1.3 mmol/L (ref 0.5–1.9)

## 2021-03-12 MED ORDER — ADULT MULTIVITAMIN W/MINERALS CH
1.0000 | ORAL_TABLET | Freq: Every day | ORAL | Status: DC
  Administered 2021-03-13 – 2021-03-20 (×8): 1 via ORAL
  Filled 2021-03-12 (×8): qty 1

## 2021-03-12 MED ORDER — LACTATED RINGERS IV SOLN: INTRAVENOUS | Status: DC

## 2021-03-12 MED ORDER — IPRATROPIUM-ALBUTEROL 0.5-2.5 (3) MG/3ML IN SOLN
3.0000 mL | Freq: Four times a day (QID) | RESPIRATORY_TRACT | Status: DC
  Administered 2021-03-12 – 2021-03-13 (×4): 3 mL via RESPIRATORY_TRACT
  Filled 2021-03-12 (×4): qty 3

## 2021-03-12 MED ORDER — GUAIFENESIN ER 600 MG PO TB12
1200.0000 mg | ORAL_TABLET | Freq: Two times a day (BID) | ORAL | Status: DC
  Administered 2021-03-12 – 2021-03-19 (×15): 1200 mg via ORAL
  Filled 2021-03-12 (×15): qty 2

## 2021-03-12 MED ORDER — BISACODYL 10 MG RE SUPP
10.0000 mg | Freq: Once | RECTAL | Status: AC
  Administered 2021-03-12: 10 mg via RECTAL
  Filled 2021-03-12: qty 1

## 2021-03-12 MED ORDER — ENSURE ENLIVE PO LIQD
237.0000 mL | Freq: Two times a day (BID) | ORAL | Status: DC
  Administered 2021-03-13 – 2021-03-20 (×10): 237 mL via ORAL

## 2021-03-12 NOTE — Progress Notes (Signed)
Initial Nutrition Assessment  DOCUMENTATION CODES:   Not applicable  INTERVENTION:   Ensure Enlive po BID, each supplement provides 350 kcal and 20 grams of protein  Magic cup TID with meals, each supplement provides 290 kcal and 9 grams of protein  Dysphagia 3 diet   MVI po daily   NUTRITION DIAGNOSIS:   Increased nutrient needs related to chronic illness (COPD, wound healing) as evidenced by estimated needs.  GOAL:   Patient will meet greater than or equal to 90% of their needs  MONITOR:   PO intake,Supplement acceptance,Labs,Weight trends,Skin,I & O's  REASON FOR ASSESSMENT:   Malnutrition Screening Tool    ASSESSMENT:   87 y.o. female with medical history significant of hypertension, hyperlipidemia, GERD, depression with anxiety, dementia, uterine cancer (s/p for hysterectomy, OSA not on CPAP, right bundle blockade, vertigo, chronic pain syndrome, anemia, pulmonary hypertension, rhabdomyolysis and anemia who is admitted with HCAP and UTI   Met with pt and pt's daughter in room today. Pt is unable to provide nutrition related history r/t dementia so history was obtained from pt's daughter at bedside. Daughter reports pt with decreased appetite and oral intake at baseline. Pt receives meals prepared by her husband but usually only eats a few bites at a time. Pt prefers to drink juices and tea vs eating. Daughter reports that pt has started to hold food in her mouth at times and forgets to swallow. Family has not yet started providing any supplements at home. Of note, pt does wear dentures. RD discussed strategies to help increase pt's oral intake at home; RD also recommended daily supplements. RD will add supplements and MVI to help pt meet her estimated needs (prefers chocolate). RD will change pt to a mechanical soft diet as pt is unable to cut up her food. Per chart, pt appears weight stable at baseline. Pt is on remeron.   Medications reviewed and include: oscal w/ D,  vitamin D, lovenox, melatonin, remeron, oxycodone, prednisone, senokot, B12, cefepime, vancomycin   Labs reviewed: creat 1.20(H) Wbc- 14.0(H), Hgb 9.6(L), Hct 29.0(L)  NUTRITION - FOCUSED PHYSICAL EXAM:  Flowsheet Row Most Recent Value  Orbital Region No depletion  Upper Arm Region Mild depletion  Thoracic and Lumbar Region No depletion  Buccal Region No depletion  Temple Region No depletion  Clavicle Bone Region Mild depletion  Clavicle and Acromion Bone Region Mild depletion  Scapular Bone Region No depletion  Dorsal Hand Severe depletion  Patellar Region No depletion  Anterior Thigh Region No depletion  Posterior Calf Region No depletion  Edema (RD Assessment) None  Hair Reviewed  Eyes Reviewed  Mouth Reviewed  Skin Reviewed  Nails Reviewed     Diet Order:   Diet Order            DIET DYS 3 Room service appropriate? No; Fluid consistency: Thin  Diet effective now                EDUCATION NEEDS:   Education needs have been addressed  Skin:  Skin Assessment: Reviewed RN Assessment (Stage II coccyx)  Last BM:  6/6  Height:   Ht Readings from Last 1 Encounters:  03/11/21 $RemoveB'5\' 4"'XLygIDoD$  (1.626 m)    Weight:   Wt Readings from Last 1 Encounters:  03/11/21 75.7 kg    Ideal Body Weight:  54.5 kg  BMI:  Body mass index is 28.63 kg/m.  Estimated Nutritional Needs:   Kcal:  1600-1800kcal/day  Protein:  75-85g/day  Fluid:  1.4-1.7L/day  Myriam Jacobson  Ajani Rineer MS, RD, LDN Please refer to AMION for RD and/or RD on-call/weekend/after hours pager  

## 2021-03-12 NOTE — Progress Notes (Signed)
[facility]  PROGRESS NOTE  [name] GLO:[id number] DOB: 12-12-1925 DOA: 03/11/2021 PCP: [facility]   Brief HPI:   87 year old female with a history of hypertension, hyperlipidemia, GERD, depression with anxiety, dementia, uterine cancer s/p hysterectomy, OSA not on CPAP, RBBB, vertigo, chronic pain syndrome, anemia, [name] hypertension, rhabdomyolysis, anemia presented with cough, shortness of breath, fever and chills.   In the ED urinalysis was positive for nitrate, few bacteria, WBC 11-20 creatinine 1.25, chest x-ray shows increased bilateral bibasilar opacity, CT head was negative for acute process, COVID PCR was negative.    Subjective   Patient seen and examined, has been drowsy this morning to arousable to verbal stimuli.  Answers questions appropriately and then goes back to sleep.   Assessment/Plan:     Sepsis -Due to UTI, pneumonia; sepsis physiology has resolved -Urine culture growing E. Coli -Chest x-ray showed pneumonia -Blood cultures x2 NTD -WBC 14,000; has low-grade fever -Continue vancomycin and cefepime   UTI -Urine culture growing > 100,000 colonies per mL of E. Coli -Continue cefepime; will adjust antibiotics after final culture  Is resulted   ?  Community-acquired pneumonia -Chest x-ray shows increased bibasilar opacity; appears more atelectasis than infection -Procalcitonin 2.04 -Patient is on antibiotics as above -Follow blood culture results -We will start incentive spirometry, flutter valve; add Mucinex 1200 mg p.o. twice daily   Hypersomnolence -Patient is on multiple sedating medications at home -As per daughter patient usually does not get somnolence these medications -She is arousable to verbal stimuli; CT head unremarkable -Continue to monitor   Acute kidney injury -Creatinine 1.25, baseline creatinine was 0.78 on 01/16/2021 -Likely poor p.o. intake -Start gentle IV hydration with LR at 75  mill per hour -Follow BMP in am     Scheduled medications:   . brimonidine  1 drop Both Eyes BID  . calcium-vitamin D  1 tablet Oral Daily  . cholecalciferol  1,000 Units Oral Daily  . enoxaparin (LOVENOX) injection  40 mg Subcutaneous Q24H  . [START ON 03/13/2021] feeding supplement  237 mL Oral BID BM  . gabapentin  100 mg Oral QHS  . guaiFENesin  1,200 mg Oral BID  . ipratropium-albuterol  3 mL Nebulization Q6H  . melatonin  10 mg Oral QHS  . mirtazapine  15 mg Oral Daily  . mometasone-formoterol  2 puff Inhalation BID  . [START ON 03/13/2021] multivitamin with minerals  1 tablet Oral Daily  . oxyCODONE  5 mg Oral BID  . predniSONE  20 mg Oral Q breakfast  . senna  2 tablet Oral QHS  . sertraline  150 mg Oral Daily  . Travoprost (BAK Free)  1 drop Both Eyes QHS  . traZODone  50 mg Oral QHS  . vitamin B-12  1,000 mcg Oral Daily         Data Reviewed:   CBG:  No results for input(s): GLUCAP in the last 168 hours.  SpO2: 95 % O2 Flow Rate (L/min): 3 L/min    Vitals:   03/12/21 0619 03/12/21 0805 03/12/21 1128 03/12/21 1545  BP: (!) 104/51 (!) 122/47 (!) 106/43 116/73  Pulse: 70 76 78 77  Resp: 20 16 16 16   Temp: 99.1 F (37.3 C) 99.4 F (37.4 C) 99.1 F (37.3 C) 99.1 F (37.3 C)  TempSrc: Oral Oral Oral Oral  SpO2: 95% 94% 91% 95%  Weight:      Height:         Intake/Output Summary (  Last 24 hours) at 03/12/2021 1850 Last data filed at 03/12/2021 1747 Gross per 24 hour  Intake 101.02 ml  Output --  Net 101.02 ml    06/04 1901 - 06/06 0700 In: -  Out: 300 [Urine:300]  Filed Weights   03/11/21 0556  Weight: 75.7 kg    CBC:  Recent Labs  Lab 03/11/21 0600 03/12/21 0542  WBC 13.6* 14.0*  HGB 10.5* 9.6*  HCT 33.3* 29.0*  PLT 238 198  MCV 95.4 95.7  MCH 30.1 31.7  MCHC 31.5 33.1  RDW 13.0 13.2  LYMPHSABS 1.2  --   MONOABS 0.8  --   EOSABS 0.0  --   BASOSABS 0.1  --     Complete metabolic panel:  Recent Labs  Lab 03/11/21 0600  03/11/21 0728 03/12/21 0542  NA 137  --  137  K 4.0  --  4.4  CL 104  --  105  CO2 23  --  24  GLUCOSE 111*  --  130*  BUN 23  --  22  CREATININE 1.25*  --  1.20*  CALCIUM 8.9  --  8.5*  AST 19  --   --   ALT 12  --   --   ALKPHOS 74  --   --   BILITOT 0.7  --   --   ALBUMIN 3.2*  --   --   PROCALCITON QUANTITY NOT SUFFICIENT, UNABLE TO PERFORM TEST  --  2.04  LATICACIDVEN 2.0* 1.6 1.3  INR 1.1  --   --   BNP 381.5*  --   --     No results for input(s): LIPASE, AMYLASE in the last 168 hours.  Recent Labs  Lab 03/11/21 0600 03/12/21 0542  BNP 381.5*  --   PROCALCITON QUANTITY NOT SUFFICIENT, UNABLE TO PERFORM TEST 2.04  SARSCOV2NAA NEGATIVE  --     ------------------------------------------------------------------------------------------------------------------ No results for input(s): CHOL, HDL, LDLCALC, TRIG, CHOLHDL, LDLDIRECT in the last 72 hours.  Lab Results  Component Value Date   HGBA1C 5.8 10/18/2016   ------------------------------------------------------------------------------------------------------------------ No results for input(s): TSH, T4TOTAL, T3FREE, THYROIDAB in the last 72 hours.  Invalid input(s): FREET3 ------------------------------------------------------------------------------------------------------------------ No results for input(s): VITAMINB12, FOLATE, FERRITIN, TIBC, IRON, RETICCTPCT in the last 72 hours.  Coagulation profile Recent Labs  Lab 03/11/21 0600  INR 1.1   No results for input(s): DDIMER in the last 72 hours.  Cardiac Enzymes No results for input(s): CKTOTAL, CKMB, CKMBINDEX, TROPONINI in the last 168 hours.  ------------------------------------------------------------------------------------------------------------------    Component Value Date/Time   BNP 381.5 (H) 03/11/2021 0600     Antibiotics: Anti-infectives (From admission, onward)   Start     Dose/Rate Route Frequency Ordered Stop   03/13/21 0800   vancomycin (VANCOREADY) IVPB 1250 mg/250 mL        1,250 mg 166.7 mL/hr over 90 Minutes Intravenous Every 48 hours 03/11/21 0747     03/12/21 0800  ceFEPIme (MAXIPIME) 2 g in sodium chloride 0.9 % 100 mL IVPB        2 g 200 mL/hr over 30 Minutes Intravenous Every 24 hours 03/11/21 0747     03/11/21 0600  vancomycin (VANCOREADY) IVPB 1500 mg/300 mL        1,500 mg 150 mL/hr over 120 Minutes Intravenous  Once 03/11/21 0549 03/11/21 0951   03/11/21 0545  ceFEPIme (MAXIPIME) 2 g in sodium chloride 0.9 % 100 mL IVPB        2 g 200 mL/hr over 30 Minutes  Intravenous  Once 03/11/21 0539 03/11/21 0703   03/11/21 0545  metroNIDAZOLE (FLAGYL) IVPB 500 mg        500 mg 100 mL/hr over 60 Minutes Intravenous  Once 03/11/21 0539 03/11/21 0729   03/11/21 0545  vancomycin (VANCOCIN) IVPB 1000 mg/200 mL premix  Status:  Discontinued        1,000 mg 200 mL/hr over 60 Minutes Intravenous  Once 03/11/21 0539 03/11/21 0549       Radiology Reports  CT Head Wo Contrast  Result Date: 03/11/2021 CLINICAL DATA:  87 year old female with unexplained altered mental status. EXAM: CT HEAD WITHOUT CONTRAST TECHNIQUE: Contiguous axial images were obtained from the base of the skull through the vertex without intravenous contrast. COMPARISON:  Brain MRI and head CT 01/13/2021. FINDINGS: Brain: Cerebral volume is stable and normal for age. No midline shift, ventriculomegaly, mass effect, evidence of mass lesion, intracranial hemorrhage or evidence of cortically based acute infarction. Stable and normal for age gray-white matter differentiation. Incidental large occipital arachnoid granulations, normal variant (series 6, image 30). Vascular: Calcified atherosclerosis at the skull base. No suspicious intracranial vascular hyperdensity. Skull: Stable.  No acute osseous abnormality identified. Sinuses/Orbits: Visualized paranasal sinuses and mastoids are stable and well aerated. Other: No acute orbit or scalp soft tissue finding.  IMPRESSION: Stable and normal for age noncontrast Head CT. Electronically Signed   By: Genevie Ann M.D.   On: 03/11/2021 06:38   DG Chest Port 1 View  Result Date: 03/11/2021 CLINICAL DATA:  87 year old female with possible sepsis. EXAM: PORTABLE CHEST 1 VIEW COMPARISON:  Portable chest 01/13/2021 and earlier. FINDINGS: Portable AP upright view at 0602 hours. Continued low lung volumes. Stable cardiac size and mediastinal contours. Further decreased bibasilar ventilation since April, but more resembles atelectasis than pneumonia. No pneumothorax or pulmonary edema. No definite pleural effusion. Visualized tracheal air column is within normal limits. Paucity of bowel gas in the upper abdomen. Severe bilateral shoulder degeneration. IMPRESSION: Low lung volumes, and increased bibasilar opacity since April but more compatible atelectasis than infection. Electronically Signed   By: Genevie Ann M.D.   On: 03/11/2021 06:18      DVT prophylaxis: Lovenox  Code Status: DNR  Family Communication: Discussed with daughter at bedside   Consultants:    Procedures:      Objective    Physical Examination:    General: Appears lethargic  Cardiovascular: S1-S2, regular, no murmur auscultated  Respiratory: Bilateral rhonchi auscultated  Abdomen: Abdomen is soft, nontender, no organomegaly  Extremities: No edema in the lower extremities  Neurologic: Somnolent but arousable   Status is: Inpatient  Dispo: The patient is from: Home              Anticipated d/c is to: Home              Anticipated d/c date is: 03/14/2021              Patient currently not stable for discharge  Barrier to discharge-ongoing treatment for pneumonia  COVID-19 Labs  No results for input(s): DDIMER, FERRITIN, LDH, CRP in the last 72 hours.  Lab Results  Component Value Date   [employer] NEGATIVE 03/11/2021   [place] NEGATIVE 01/16/2021   [place] NEGATIVE 01/13/2021    Microbiology  Recent Results  (from the past 240 hour(s))  Resp Panel by RT-PCR (Flu A&B, Covid) Nasopharyngeal Swab     Status: None   Collection Time: 03/11/21  6:00 AM   Specimen: Nasopharyngeal Swab; Nasopharyngeal(NP) swabs in  vial transport medium  Result Value Ref Range Status   SARS Coronavirus 2 by RT PCR NEGATIVE NEGATIVE Final    Comment: (NOTE) SARS-CoV-2 target nucleic acids are NOT DETECTED.  The SARS-CoV-2 RNA is generally detectable in upper respiratory specimens during the acute phase of infection. The lowest concentration of SARS-CoV-2 viral copies this assay can detect is 138 copies/mL. A negative result does not preclude SARS-Cov-2 infection and should not be used as the sole basis for treatment or other patient management decisions. A negative result may occur with  improper specimen collection/handling, submission of specimen other than nasopharyngeal swab, presence of viral mutation(s) within the areas targeted by this assay, and inadequate number of viral copies(<138 copies/mL). A negative result must be combined with clinical observations, patient history, and epidemiological information. The expected result is Negative.  Fact Sheet for Patients:  EntrepreneurPulse.com.au  Fact Sheet for Healthcare Providers:  IncredibleEmployment.be  This test is no t yet approved or cleared by the [place] FDA and  has been authorized for detection and/or diagnosis of SARS-CoV-2 by FDA under an Emergency Use Authorization (EUA). This EUA will remain  in effect (meaning this test can be used) for the duration of the COVID-19 declaration under Section 564(b)(1) of the Act, 21 U.S.C.section 360bbb-3(b)(1), unless the authorization is terminated  or revoked sooner.       Influenza A by PCR NEGATIVE NEGATIVE Final   Influenza B by PCR NEGATIVE NEGATIVE Final    Comment: (NOTE) The Xpert Xpress SARS-CoV-2/FLU/RSV plus assay is intended as an aid in the  diagnosis of influenza from Nasopharyngeal swab specimens and should not be used as a sole basis for treatment. Nasal washings and aspirates are unacceptable for Xpert Xpress SARS-CoV-2/FLU/RSV testing.  Fact Sheet for Patients: EntrepreneurPulse.com.au  Fact Sheet for Healthcare Providers: IncredibleEmployment.be  This test is not yet approved or cleared by the [place] FDA and has been authorized for detection and/or diagnosis of SARS-CoV-2 by FDA under an Emergency Use Authorization (EUA). This EUA will remain in effect (meaning this test can be used) for the duration of the COVID-19 declaration under Section 564(b)(1) of the Act, 21 U.S.C. section 360bbb-3(b)(1), unless the authorization is terminated or revoked.  Performed at [facility], [address] [place], [place] [address]   Urine culture     Status: Abnormal (Preliminary result)   Collection Time: 03/11/21  6:00 AM   Specimen: In/Out Cath Urine  Result Value Ref Range Status   Specimen Description   Final    IN/OUT CATH URINE Performed at [facility], [address] [place], [place] [address]    Special Requests   Final    NONE Performed at [facility], [address] [place], [place] [address]    Culture (A)  Final    >=100,000 COLONIES/mL ESCHERICHIA COLI SUSCEPTIBILITIES TO FOLLOW Performed at [facility], [place] [address], [place], [place] [address]    Report Status PENDING  Incomplete  Blood Culture (routine x 2)     Status: None (Preliminary result)   Collection Time: 03/11/21  6:01 AM   Specimen: BLOOD  Result Value Ref Range Status   Specimen Description BLOOD LEFT ARM  Final   Special Requests   Final    BOTTLES DRAWN AEROBIC AND ANAEROBIC Blood Culture adequate volume   Culture   Final    NO GROWTH 1 DAY Performed at [facility], [address], [place],  [address]    Report  Status PENDING   Incomplete  Blood Culture (routine x 2)     Status: None (Preliminary result)   Collection Time: 03/11/21  6:02 AM   Specimen: BLOOD  Result Value Ref Range Status   Specimen Description BLOOD RIGHT ARM  Final   Special Requests   Final    BOTTLES DRAWN AEROBIC AND ANAEROBIC Blood Culture adequate volume   Culture   Final    NO GROWTH 1 DAY Performed at [facility], [address] [place], [place] [address]    Report Status PENDING  Incomplete    Pressure Injury 01/13/21 Buttocks Right Deep Tissue Pressure Injury - Purple or maroon localized area of discolored intact skin or blood-filled blister due to damage of underlying soft tissue from pressure and/or shear. dark purple spot, nonblanchable (Active)  01/13/21 2123  Location: Buttocks  Location Orientation: Right  Staging: Deep Tissue Pressure Injury - Purple or maroon localized area of discolored intact skin or blood-filled blister due to damage of underlying soft tissue from pressure and/or shear.  Wound Description (Comments): dark purple spot, nonblanchable  Present on Admission: Yes     Pressure Injury 03/11/21 Coccyx Stage 2 -  Partial thickness loss of dermis presenting as a shallow open injury with a red, pink wound bed without slough. (Active)  03/11/21 2318  Location: Coccyx  Location Orientation:   Staging: Stage 2 -  Partial thickness loss of dermis presenting as a shallow open injury with a red, pink wound bed without slough.  Wound Description (Comments):   Present on Admission: Yes          [place]   [facility] If 7PM-7AM, please contact night-coverage at [email or web address], Office  [phone]   03/12/2021, 6:50 PM  LOS: 1 day

## 2021-03-13 ENCOUNTER — Inpatient Hospital Stay: Payer: Medicare (Managed Care)

## 2021-03-13 LAB — BASIC METABOLIC PANEL
Anion gap: 9 (ref 5–15)
BUN: 21 mg/dL (ref 8–23)
CO2: 24 mmol/L (ref 22–32)
Calcium: 9.2 mg/dL (ref 8.9–10.3)
Chloride: 104 mmol/L (ref 98–111)
Creatinine, Ser: 1.06 mg/dL — ABNORMAL HIGH (ref 0.44–1.00)
GFR, Estimated: 49 mL/min — ABNORMAL LOW (ref >60)
Glucose, Bld: 94 mg/dL (ref 70–99)
Potassium: 4.1 mmol/L (ref 3.5–5.1)
Sodium: 137 mmol/L (ref 135–145)

## 2021-03-13 LAB — CBC
HCT: 33.6 % — ABNORMAL LOW (ref 36.0–46.0)
Hemoglobin: 10.6 g/dL — ABNORMAL LOW (ref 12.0–15.0)
MCH: 30.7 pg (ref 26.0–34.0)
MCHC: 31.5 g/dL (ref 30.0–36.0)
MCV: 97.4 fL (ref 80.0–100.0)
Platelets: 213 10*3/uL (ref 150–400)
RBC: 3.45 MIL/uL — ABNORMAL LOW (ref 3.87–5.11)
RDW: 13.2 % (ref 11.5–15.5)
WBC: 13.5 10*3/uL — ABNORMAL HIGH (ref 4.0–10.5)
nRBC: 0 % (ref 0.0–0.2)

## 2021-03-13 LAB — LEGIONELLA PNEUMOPHILA SEROGP 1 UR AG: L. pneumophila Serogp 1 Ur Ag: NEGATIVE

## 2021-03-13 LAB — URINE CULTURE: Culture: >=100000 — AB

## 2021-03-13 MED ORDER — MIDAZOLAM HCL 2 MG/2ML IJ SOLN
1.0000 mg | Freq: Once | INTRAMUSCULAR | Status: AC
  Administered 2021-03-13: 1 mg via INTRAVENOUS
  Filled 2021-03-13: qty 1

## 2021-03-13 MED ORDER — IPRATROPIUM-ALBUTEROL 0.5-2.5 (3) MG/3ML IN SOLN
3.0000 mL | Freq: Three times a day (TID) | RESPIRATORY_TRACT | Status: DC
  Administered 2021-03-13 – 2021-03-14 (×2): 3 mL via RESPIRATORY_TRACT
  Filled 2021-03-13 (×2): qty 3

## 2021-03-13 NOTE — Progress Notes (Addendum)
Pt is scheduled for MRI but is claustrophobic per daughters report. NP Hassan Rowan made aware. Will continue to monitor.  Update 2241: NP Randol Kern ordered 1 mg midazolam (VERSED) IV once. Will continue to monitor.  Update 2202: Versed 1 mg given. Pt transported to MRI. Will continue to monitor.  Update 0129: Pt was placed back on MRI. Will continue to monitor.

## 2021-03-13 NOTE — Plan of Care (Signed)
  Problem: Education: Goal: Knowledge of General Education information will improve Description: Including pain rating scale, medication(s)/side effects and non-pharmacologic comfort measures 03/13/2021 1339 by Cristela Blue, RN Outcome: Progressing 03/13/2021 1339 by Cristela Blue, RN Outcome: Progressing   Problem: Health Behavior/Discharge Planning: Goal: Ability to manage health-related needs will improve 03/13/2021 1339 by Cristela Blue, RN Outcome: Progressing 03/13/2021 1339 by Cristela Blue, RN Outcome: Progressing   Problem: Clinical Measurements: Goal: Ability to maintain clinical measurements within normal limits will improve 03/13/2021 1339 by Cristela Blue, RN Outcome: Progressing 03/13/2021 1339 by Cristela Blue, RN Outcome: Progressing Goal: Will remain free from infection 03/13/2021 1339 by Cristela Blue, RN Outcome: Progressing 03/13/2021 1339 by Cristela Blue, RN Outcome: Progressing Goal: Diagnostic test results will improve 03/13/2021 1339 by Cristela Blue, RN Outcome: Progressing 03/13/2021 1339 by Cristela Blue, RN Outcome: Progressing Goal: Respiratory complications will improve 03/13/2021 1339 by Cristela Blue, RN Outcome: Progressing 03/13/2021 1339 by Cristela Blue, RN Outcome: Progressing Goal: Cardiovascular complication will be avoided 03/13/2021 1339 by Cristela Blue, RN Outcome: Progressing 03/13/2021 1339 by Cristela Blue, RN Outcome: Progressing   Problem: Activity: Goal: Risk for activity intolerance will decrease 03/13/2021 1339 by Cristela Blue, RN Outcome: Progressing 03/13/2021 1339 by Cristela Blue, RN Outcome: Progressing   Problem: Nutrition: Goal: Adequate nutrition will be maintained 03/13/2021 1339 by Cristela Blue, RN Outcome: Progressing 03/13/2021 1339 by Cristela Blue, RN Outcome: Progressing   Problem: Coping: Goal: Level of anxiety will decrease 03/13/2021 1339 by Cristela Blue, RN Outcome: Progressing 03/13/2021 1339 by  Cristela Blue, RN Outcome: Progressing   Problem: Elimination: Goal: Will not experience complications related to bowel motility 03/13/2021 1339 by Cristela Blue, RN Outcome: Progressing 03/13/2021 1339 by Cristela Blue, RN Outcome: Progressing Goal: Will not experience complications related to urinary retention 03/13/2021 1339 by Cristela Blue, RN Outcome: Progressing 03/13/2021 1339 by Cristela Blue, RN Outcome: Progressing   Problem: Pain Managment: Goal: General experience of comfort will improve 03/13/2021 1339 by Cristela Blue, RN Outcome: Progressing 03/13/2021 1339 by Cristela Blue, RN Outcome: Progressing   Problem: Safety: Goal: Ability to remain free from injury will improve 03/13/2021 1339 by Cristela Blue, RN Outcome: Progressing 03/13/2021 1339 by Cristela Blue, RN Outcome: Progressing   Problem: Skin Integrity: Goal: Risk for impaired skin integrity will decrease 03/13/2021 1339 by Cristela Blue, RN Outcome: Progressing 03/13/2021 1339 by Cristela Blue, RN Outcome: Progressing

## 2021-03-13 NOTE — Plan of Care (Signed)
°  Problem: Clinical Measurements: °Goal: Respiratory complications will improve °Outcome: Progressing °  °Problem: Clinical Measurements: °Goal: Cardiovascular complication will be avoided °Outcome: Progressing °  °Problem: Pain Managment: °Goal: General experience of comfort will improve °Outcome: Progressing °  °Problem: Safety: °Goal: Ability to remain free from injury will improve °Outcome: Progressing °  °

## 2021-03-13 NOTE — Progress Notes (Signed)
[facility]  PROGRESS NOTE  [name] UXN:[id number] DOB: 06-Feb-1926 DOA: 03/11/2021 PCP: [facility], [name]   Brief HPI:   87 year old female with a history of hypertension, hyperlipidemia, GERD, depression with anxiety, dementia, uterine cancer s/p hysterectomy, OSA not on CPAP, RBBB, vertigo, chronic pain syndrome, anemia, [name] hypertension, rhabdomyolysis, anemia presented with cough, shortness of breath, fever and chills.   In the ED urinalysis was positive for nitrate, few bacteria, WBC 11-20 creatinine 1.25, chest x-ray shows increased bilateral bibasilar opacity, CT head was negative for acute process, COVID PCR was negative.    Subjective   Patient seen and examined, continues to be drowsy, but arousable.  Family at bedside.   Assessment/Plan:     Sepsis -Due to UTI, pneumonia; sepsis physiology has resolved -Urine culture growing E. Coli -Chest x-ray showed pneumonia -Blood cultures x2 NTD -WBC 13.5, has low-grade fever -Continue vancomycin and cefepime   UTI -Urine culture growing > 100,000 colonies per mL of E. Coli sensitive to cefepime, ceftriaxone -Continue cefepime; also covering for pneumonia as above   ?  Community-acquired pneumonia -Chest x-ray shows increased bibasilar opacity; appears more atelectasis than infection -Procalcitonin 2.04 -Patient is on antibiotics as above -Follow blood culture results -Continue  incentive spirometry, flutter valve;  Mucinex 1200 mg p.o. twice daily   Hypersomnolence -Patient is on multiple sedating medications at home -As per daughter patient usually does not get somnolence these medications -She is arousable to verbal stimuli; CT head unremarkable -We will hold melatonin, trazodone, Remeron -We will obtain MRI brain without contrast, discussed with neurology.  Dr. Leonel Ramsay will see patient in a.m.   Acute kidney injury -Creatinine 1.06, baseline creatinine was 0.78 on  01/16/2021 -Likely poor p.o. intake -IV fluids discontinued to prevent fluid overload, as patient was having worsening dyspnea this morning -Follow BMP in am     Scheduled medications:   . brimonidine  1 drop Both Eyes BID  . calcium-vitamin D  1 tablet Oral Daily  . cholecalciferol  1,000 Units Oral Daily  . enoxaparin (LOVENOX) injection  40 mg Subcutaneous Q24H  . feeding supplement  237 mL Oral BID BM  . gabapentin  100 mg Oral QHS  . guaiFENesin  1,200 mg Oral BID  . ipratropium-albuterol  3 mL Nebulization Q6H  . mometasone-formoterol  2 puff Inhalation BID  . multivitamin with minerals  1 tablet Oral Daily  . oxyCODONE  5 mg Oral BID  . predniSONE  20 mg Oral Q breakfast  . senna  2 tablet Oral QHS  . sertraline  150 mg Oral Daily  . Travoprost (BAK Free)  1 drop Both Eyes QHS  . vitamin B-12  1,000 mcg Oral Daily         Data Reviewed:   CBG:  No results for input(s): GLUCAP in the last 168 hours.  SpO2: 91 % O2 Flow Rate (L/min): 8 L/min    Vitals:   03/13/21 0745 03/13/21 0842 03/13/21 0900 03/13/21 1138  BP:  (!) 115/47  118/62  Pulse:  76  79  Resp:  18  18  Temp:  98.5 F (36.9 C)  97.8 F (36.6 C)  TempSrc:  Oral    SpO2: 91% (!) 87% 91% 91%  Weight:      Height:         Intake/Output Summary (Last 24 hours) at 03/13/2021 1546 Last data filed at 03/13/2021 1454 Gross per 24 hour  Intake 1092.89 ml  Output  1400 ml  Net -307.11 ml    06/05 1901 - 06/07 0700 In: 1092.9 [P.O.:240; I.V.:751.9] Out: 700 [Urine:700]  Filed Weights   03/11/21 0556  Weight: 75.7 kg    CBC:  Recent Labs  Lab 03/11/21 0600 03/12/21 0542 03/13/21 0612  WBC 13.6* 14.0* 13.5*  HGB 10.5* 9.6* 10.6*  HCT 33.3* 29.0* 33.6*  PLT 238 198 213  MCV 95.4 95.7 97.4  MCH 30.1 31.7 30.7  MCHC 31.5 33.1 31.5  RDW 13.0 13.2 13.2  LYMPHSABS 1.2  --   --   MONOABS 0.8  --   --   EOSABS 0.0  --   --   BASOSABS 0.1  --   --     Complete metabolic  panel:  Recent Labs  Lab 03/11/21 0600 03/11/21 0728 03/12/21 0542 03/13/21 0612  NA 137  --  137 137  K 4.0  --  4.4 4.1  CL 104  --  105 104  CO2 23  --  24 24  GLUCOSE 111*  --  130* 94  BUN 23  --  22 21  CREATININE 1.25*  --  1.20* 1.06*  CALCIUM 8.9  --  8.5* 9.2  AST 19  --   --   --   ALT 12  --   --   --   ALKPHOS 74  --   --   --   BILITOT 0.7  --   --   --   ALBUMIN 3.2*  --   --   --   PROCALCITON QUANTITY NOT SUFFICIENT, UNABLE TO PERFORM TEST  --  2.04  --   LATICACIDVEN 2.0* 1.6 1.3  --   INR 1.1  --   --   --   BNP 381.5*  --   --   --     No results for input(s): LIPASE, AMYLASE in the last 168 hours.  Recent Labs  Lab 03/11/21 0600 03/12/21 0542  BNP 381.5*  --   PROCALCITON QUANTITY NOT SUFFICIENT, UNABLE TO PERFORM TEST 2.04  SARSCOV2NAA NEGATIVE  --     ------------------------------------------------------------------------------------------------------------------ No results for input(s): CHOL, HDL, LDLCALC, TRIG, CHOLHDL, LDLDIRECT in the last 72 hours.  Lab Results  Component Value Date   HGBA1C 5.8 10/18/2016   ------------------------------------------------------------------------------------------------------------------ No results for input(s): TSH, T4TOTAL, T3FREE, THYROIDAB in the last 72 hours.  Invalid input(s): FREET3 ------------------------------------------------------------------------------------------------------------------ No results for input(s): VITAMINB12, FOLATE, FERRITIN, TIBC, IRON, RETICCTPCT in the last 72 hours.  Coagulation profile Recent Labs  Lab 03/11/21 0600  INR 1.1   No results for input(s): DDIMER in the last 72 hours.  Cardiac Enzymes No results for input(s): CKTOTAL, CKMB, CKMBINDEX, TROPONINI in the last 168 hours.  ------------------------------------------------------------------------------------------------------------------    Component Value Date/Time   BNP 381.5 (H) 03/11/2021 0600      Antibiotics: Anti-infectives (From admission, onward)   Start     Dose/Rate Route Frequency Ordered Stop   03/13/21 0800  vancomycin (VANCOREADY) IVPB 1250 mg/250 mL        1,250 mg 166.7 mL/hr over 90 Minutes Intravenous Every 48 hours 03/11/21 0747     03/12/21 0800  ceFEPIme (MAXIPIME) 2 g in sodium chloride 0.9 % 100 mL IVPB        2 g 200 mL/hr over 30 Minutes Intravenous Every 24 hours 03/11/21 0747     03/11/21 0600  vancomycin (VANCOREADY) IVPB 1500 mg/300 mL        1,500 mg 150 mL/hr over 120  Minutes Intravenous  Once 03/11/21 0549 03/11/21 0951   03/11/21 0545  ceFEPIme (MAXIPIME) 2 g in sodium chloride 0.9 % 100 mL IVPB        2 g 200 mL/hr over 30 Minutes Intravenous  Once 03/11/21 0539 03/11/21 0703   03/11/21 0545  metroNIDAZOLE (FLAGYL) IVPB 500 mg        500 mg 100 mL/hr over 60 Minutes Intravenous  Once 03/11/21 0539 03/11/21 0729   03/11/21 0545  vancomycin (VANCOCIN) IVPB 1000 mg/200 mL premix  Status:  Discontinued        1,000 mg 200 mL/hr over 60 Minutes Intravenous  Once 03/11/21 0539 03/11/21 0549       Radiology Reports  No results found.    DVT prophylaxis: Lovenox  Code Status: DNR  Family Communication: Discussed with daughter and husband at bedside   Consultants:    Procedures:      Objective    Physical Examination:    General-appears  lethargic  Heart-S1-S2, regular, no murmur auscultated  Lungs-clear to auscultation bilaterally, no wheezing or crackles auscultated  Abdomen-soft, nontender, no organomegaly  Extremities-no edema in the lower extremities  Neuro-somnolent but arousable, follows commands   Status is: Inpatient  Dispo: The patient is from: Home              Anticipated d/c is to: Home              Anticipated d/c date is: 03/16/2021              Patient currently not stable for discharge  Barrier to discharge-ongoing treatment for pneumonia  COVID-19 Labs  No results for input(s): DDIMER,  FERRITIN, LDH, CRP in the last 72 hours.  Lab Results  Component Value Date   [place] NEGATIVE 03/11/2021   [place] NEGATIVE 01/16/2021   [place] NEGATIVE 01/13/2021    Microbiology  Recent Results (from the past 240 hour(s))  Resp Panel by RT-PCR (Flu A&B, Covid) Nasopharyngeal Swab     Status: None   Collection Time: 03/11/21  6:00 AM   Specimen: Nasopharyngeal Swab; Nasopharyngeal(NP) swabs in vial transport medium  Result Value Ref Range Status   SARS Coronavirus 2 by RT PCR NEGATIVE NEGATIVE Final    Comment: (NOTE) SARS-CoV-2 target nucleic acids are NOT DETECTED.  The SARS-CoV-2 RNA is generally detectable in upper respiratory specimens during the acute phase of infection. The lowest concentration of SARS-CoV-2 viral copies this assay can detect is 138 copies/mL. A negative result does not preclude SARS-Cov-2 infection and should not be used as the sole basis for treatment or other patient management decisions. A negative result may occur with  improper specimen collection/handling, submission of specimen other than nasopharyngeal swab, presence of viral mutation(s) within the areas targeted by this assay, and inadequate number of viral copies(<138 copies/mL). A negative result must be combined with clinical observations, patient history, and epidemiological information. The expected result is Negative.  Fact Sheet for Patients:  EntrepreneurPulse.com.au  Fact Sheet for Healthcare Providers:  IncredibleEmployment.be  This test is no t yet approved or cleared by the [place] FDA and  has been authorized for detection and/or diagnosis of SARS-CoV-2 by FDA under an Emergency Use Authorization (EUA). This EUA will remain  in effect (meaning this test can be used) for the duration of the COVID-19 declaration under Section 564(b)(1) of the Act, 21 U.S.C.section 360bbb-3(b)(1), unless the authorization is terminated  or  revoked sooner.       Influenza A by  PCR NEGATIVE NEGATIVE Final   Influenza B by PCR NEGATIVE NEGATIVE Final    Comment: (NOTE) The Xpert Xpress SARS-CoV-2/FLU/RSV plus assay is intended as an aid in the diagnosis of influenza from Nasopharyngeal swab specimens and should not be used as a sole basis for treatment. Nasal washings and aspirates are unacceptable for Xpert Xpress SARS-CoV-2/FLU/RSV testing.  Fact Sheet for Patients: EntrepreneurPulse.com.au  Fact Sheet for Healthcare Providers: IncredibleEmployment.be  This test is not yet approved or cleared by the [place] FDA and has been authorized for detection and/or diagnosis of SARS-CoV-2 by FDA under an Emergency Use Authorization (EUA). This EUA will remain in effect (meaning this test can be used) for the duration of the COVID-19 declaration under Section 564(b)(1) of the Act, 21 U.S.C. section 360bbb-3(b)(1), unless the authorization is terminated or revoked.  Performed at [facility], [address], [place], [place] [address]   Urine culture     Status: Abnormal   Collection Time: 03/11/21  6:00 AM   Specimen: In/Out Cath Urine  Result Value Ref Range Status   Specimen Description   Final    IN/OUT CATH URINE Performed at [facility], [address] [place], [place] [address]    Special Requests   Final    NONE Performed at [facility], [address] [place], [place] [address]    Culture >=100,000 COLONIES/mL ESCHERICHIA COLI (A)  Final   Report Status 03/13/2021 FINAL  Final   Organism ID, Bacteria ESCHERICHIA COLI (A)  Final      Susceptibility   Escherichia coli - MIC*    AMPICILLIN >=32 RESISTANT Resistant     CEFAZOLIN >=64 RESISTANT Resistant     CEFEPIME <=0.12 SENSITIVE Sensitive     CEFTRIAXONE 0.5 SENSITIVE Sensitive     CIPROFLOXACIN <=0.25 SENSITIVE Sensitive     GENTAMICIN <=1 SENSITIVE Sensitive     IMIPENEM <=0.25  SENSITIVE Sensitive     NITROFURANTOIN <=16 SENSITIVE Sensitive     TRIMETH/SULFA <=20 SENSITIVE Sensitive     AMPICILLIN/SULBACTAM >=32 RESISTANT Resistant     PIP/TAZO 8 SENSITIVE Sensitive     * >=100,000 COLONIES/mL ESCHERICHIA COLI  Blood Culture (routine x 2)     Status: None (Preliminary result)   Collection Time: 03/11/21  6:01 AM   Specimen: BLOOD  Result Value Ref Range Status   Specimen Description BLOOD LEFT ARM  Final   Special Requests   Final    BOTTLES DRAWN AEROBIC AND ANAEROBIC Blood Culture adequate volume   Culture   Final    NO GROWTH 2 DAYS Performed at [facility], [address], [place], [place] [address]    Report Status PENDING  Incomplete  Blood Culture (routine x 2)     Status: None (Preliminary result)   Collection Time: 03/11/21  6:02 AM   Specimen: BLOOD  Result Value Ref Range Status   Specimen Description BLOOD RIGHT ARM  Final   Special Requests   Final    BOTTLES DRAWN AEROBIC AND ANAEROBIC Blood Culture adequate volume   Culture   Final    NO GROWTH 2 DAYS Performed at [facility], [address], [place], [place] [address]    Report Status PENDING  Incomplete    Pressure Injury 01/13/21 Buttocks Right Deep Tissue Pressure Injury - Purple or maroon localized area of discolored intact skin or blood-filled blister due to damage of underlying soft tissue from pressure and/or shear. dark purple spot, nonblanchable (Active)  01/13/21  2123  Location: Buttocks  Location Orientation: Right  Staging: Deep Tissue Pressure Injury - Purple or maroon localized area of discolored intact skin or blood-filled blister due to damage of underlying soft tissue from pressure and/or shear.  Wound Description (Comments): dark purple spot, nonblanchable  Present on Admission: Yes     Pressure Injury 03/11/21 Coccyx Stage 2 -  Partial thickness loss of dermis presenting as a shallow open injury with a red, pink wound bed without slough.  (Active)  03/11/21 2318  Location: Coccyx  Location Orientation:   Staging: Stage 2 -  Partial thickness loss of dermis presenting as a shallow open injury with a red, pink wound bed without slough.  Wound Description (Comments):   Present on Admission: Yes          [place]   [facility] If 7PM-7AM, please contact night-coverage at [email or web address], Office  [phone]   03/13/2021, 3:46 PM  LOS: 2 days

## 2021-03-13 NOTE — Consult Note (Signed)
Neurology Consultation Reason for Consult: AMS/lethargy Referring Physician: Georgiann Mohs  CC: L[place]  History is obtained from: Patient's usband, chart  HPI: [name] is a 87 y.o. female with a history of dementia who was admitted on 06/05 with cough, SOB, fever and was started on treatment for pneumonia with vancomycin and cefepime. Her CXR was relatively clear, but she had nitrite+ UA, and her urine culture is growing >100,000 Ecoli.  She was noticed to be more confused as well as sleepy, and due to her husband becoming concerned yesterday, neurology has been consulted.  He reports she is doing much better today.  At baseline, she has a fairly significant dementia, and with "no short-term memory" per her husband.    ROS: A 14 point ROS was performed and is negative except as noted in the HPI.   Past Medical History:  Diagnosis Date  . Anxiety   . Arthritis   . Cancer [facility])    uterine and questionable vulvar, s/p hysterectomy  . Chronic headaches   . Cystocele   . Dementia ([place])    able to sign own papers  . Depression   . GERD (gastroesophageal reflux disease)   . Glaucoma   . Hypercholesterolemia   . Hypertension   . Macular degeneration   . Reactive airway disease   . Rectocele   . Right bundle branch block   . Sleep apnea   . Uterine cancer ([place])   . Vertigo    none - several yrs  . Wears dentures    full upper and lower     Family History  Problem Relation Age of Onset  . Heart disease Mother   . Cancer Father        leukemia  . Heart disease Brother        x2  . Cancer Daughter        breast  . Kidney cancer Neg Hx   . Bladder Cancer Neg Hx      Social History:  reports that she has never smoked. She has never used smokeless tobacco. She reports that she does not drink alcohol and does not use drugs.   Exam: Current vital signs: BP 118/62 (BP Location: Left Arm)   Pulse 79   Temp 97.8 F (36.6 C)   Resp 18   Ht 5\' 4"  (1.626 m)   Wt  75.7 kg   SpO2 91%   BMI 28.63 kg/m  Vital signs in last 24 hours: Temp:  [97.8 F (36.6 C)-99.1 F (37.3 C)] 97.8 F (36.6 C) (06/07 1138) Pulse Rate:  [74-79] 79 (06/07 1138) Resp:  [16-20] 18 (06/07 1138) BP: (115-164)/(47-73) 118/62 (06/07 1138) SpO2:  [80 %-96 %] 91 % (06/07 1138)   Physical Exam  Constitutional: Appears well-developed and well-nourished.  Psych: Affect appropriate to situation Eyes: No scleral injection HENT: No OP obstruction MSK: no joint deformities.  Cardiovascular: Normal rate and regular rhythm.  Respiratory: Effort normal, non-labored breathing GI: Soft.  No distension. There is no tenderness.  Skin: WDI  Neuro: Mental Status: Patient is awake, alert, she states that she is at a house, month is august and she will not guesss the year Cranial Nerves: II: Visual Fields are full. Pupils are equal, round, and reactive to light.   III,IV, VI: EOMI without ptosis or diploplia.  V: Facial sensation is symmetric to temperature VII: Facial movement is symmetric.  VIII: hearing is intact to voice X: Uvula elevates symmetrically XI: Shoulder shrug is  symmetric. XII: tongue is midline without atrophy or fasciculations.  Motor: Tone is normal. Bulk is normal. 5/5 strength was present in all four extremities.  Sensory: Sensation is symmetric to light touch and temperature in the arms and legs. Cerebellar: She has intentional tremor bilaterally   I have reviewed labs in epic and the results pertinent to this consultation are: Cr 1.02 From April: TSH 2.28 B12 2,389  I have reviewed the images obtained:MRI brain - negative.   Impression: 87 yo F with dementia with mild delirium in the setting of UTI manifesting as increased confusion and lethargy. With negative MRI, I do not think that any further workup is likely to be beneficial at this time. Treatment will include treating her underlying medical conditions. If her mental status worsens, neurology  will be available as needed.   Recommendations: 1) Neurology will be available on an as needed basis moving forward.    Roland Rack, MD [facility] [phone]  If 7pm- 7am, please page neurology on call as listed in [place].

## 2021-03-14 DIAGNOSIS — L8990 Pressure ulcer of unspecified site, unspecified stage: Secondary | ICD-10-CM | POA: Insufficient documentation

## 2021-03-14 DIAGNOSIS — G9341 Metabolic encephalopathy: Secondary | ICD-10-CM

## 2021-03-14 LAB — COMPREHENSIVE METABOLIC PANEL
ALT: 10 U/L (ref 0–44)
AST: 11 U/L — ABNORMAL LOW (ref 15–41)
Albumin: 2.7 g/dL — ABNORMAL LOW (ref 3.5–5.0)
Alkaline Phosphatase: 74 U/L (ref 38–126)
Anion gap: 8 (ref 5–15)
BUN: 22 mg/dL (ref 8–23)
CO2: 24 mmol/L (ref 22–32)
Calcium: 8.8 mg/dL — ABNORMAL LOW (ref 8.9–10.3)
Chloride: 105 mmol/L (ref 98–111)
Creatinine, Ser: 1.02 mg/dL — ABNORMAL HIGH (ref 0.44–1.00)
GFR, Estimated: 52 mL/min — ABNORMAL LOW (ref >60)
Glucose, Bld: 122 mg/dL — ABNORMAL HIGH (ref 70–99)
Potassium: 3.9 mmol/L (ref 3.5–5.1)
Sodium: 137 mmol/L (ref 135–145)
Total Bilirubin: 0.7 mg/dL (ref 0.3–1.2)
Total Protein: 5.9 g/dL — ABNORMAL LOW (ref 6.5–8.1)

## 2021-03-14 LAB — CBC
HCT: 28.3 % — ABNORMAL LOW (ref 36.0–46.0)
Hemoglobin: 9.1 g/dL — ABNORMAL LOW (ref 12.0–15.0)
MCH: 30.8 pg (ref 26.0–34.0)
MCHC: 32.2 g/dL (ref 30.0–36.0)
MCV: 95.9 fL (ref 80.0–100.0)
Platelets: 228 10*3/uL (ref 150–400)
RBC: 2.95 MIL/uL — ABNORMAL LOW (ref 3.87–5.11)
RDW: 13.2 % (ref 11.5–15.5)
WBC: 10.8 10*3/uL — ABNORMAL HIGH (ref 4.0–10.5)
nRBC: 0 % (ref 0.0–0.2)

## 2021-03-14 MED ORDER — IPRATROPIUM-ALBUTEROL 0.5-2.5 (3) MG/3ML IN SOLN
3.0000 mL | Freq: Two times a day (BID) | RESPIRATORY_TRACT | Status: DC
  Administered 2021-03-14 – 2021-03-15 (×2): 3 mL via RESPIRATORY_TRACT
  Filled 2021-03-14 (×2): qty 3

## 2021-03-14 MED ORDER — PANTOPRAZOLE SODIUM 40 MG PO TBEC
40.0000 mg | DELAYED_RELEASE_TABLET | Freq: Two times a day (BID) | ORAL | Status: DC
  Administered 2021-03-14 – 2021-03-20 (×12): 40 mg via ORAL
  Filled 2021-03-14 (×12): qty 1

## 2021-03-14 NOTE — Care Management Important Message (Signed)
Important Message  Patient Details  Name: Ling Flesch MRN: [id number] Date of Birth: 1926-03-08   Medicare Important Message Given:  Yes     Dannette Barbara 03/14/2021, 11:42 AM

## 2021-03-14 NOTE — Progress Notes (Signed)
PROGRESS NOTE  [name] QAS:[id number] DOB: [birth year] DOA: 03/11/2021 PCP: [facility] [place]   LOS: 3 days   Brief Narrative / Interim history: 87 year old female with HTN, HLD, depression/anxiety, dementia, uterine cancer status post hysterectomy, OSA not on CPAP, RBBB, comes into the hospital with cough, shortness of breath, fever and chills.  Chest x-ray was concerning for pneumonia, she was placed on antibiotics and admitted to the hospital.  Hospital course complicated by somnolence/metabolic encephalopathy  Subjective / 24h Interval events: She is alert this morning, doing overall well.  Daughter is at bedside and tells me that yesterday was a rough day with patient being worse than normal and not recognizing family members that otherwise she would have in the past.  Still confused today but better than yesterday.  Not yet at baseline  Assessment & Plan: Principal Problem Sepsis due to pneumonia, acute hypoxic respiratory failure-patient was started on antibiotics with vancomycin and cefepime, seems to be overall improving.  Cultures are negative on day 3, likely narrow antibiotics tomorrow -Currently on 8 L high flow, wean off oxygen as tolerated  Active Problems Bacteriuria-given underlying dementia not sure whether she had symptoms of this is colonization.  She is covered as per #1  Acute metabolic encephalopathy, somnolence-CT head was unremarkable.  She underwent an MRI which is negative for stroke.  Neurology saw the patient today believes this is related to her infectious process.  She is also on multiple sedating medications at home, and now she is off her melatonin, trazodone, Remeron.  Monitor clinically  Acute kidney injury-creatinine overall stable  Dementia-noted  Essential hypertension-hold lisinopril due to AKI  Scheduled Meds: . brimonidine  1 drop Both Eyes BID  . calcium-vitamin D  1 tablet Oral Daily  . cholecalciferol   1,000 Units Oral Daily  . enoxaparin (LOVENOX) injection  40 mg Subcutaneous Q24H  . feeding supplement  237 mL Oral BID BM  . gabapentin  100 mg Oral QHS  . guaiFENesin  1,200 mg Oral BID  . ipratropium-albuterol  3 mL Nebulization BID  . mometasone-formoterol  2 puff Inhalation BID  . multivitamin with minerals  1 tablet Oral Daily  . oxyCODONE  5 mg Oral BID  . pantoprazole  40 mg Oral BID AC  . predniSONE  20 mg Oral Q breakfast  . senna  2 tablet Oral QHS  . sertraline  150 mg Oral Daily  . Travoprost (BAK Free)  1 drop Both Eyes QHS  . vitamin B-12  1,000 mcg Oral Daily   Continuous Infusions: . ceFEPime (MAXIPIME) IV 2 g (03/14/21 0943)  . vancomycin 1,250 mg (03/13/21 1019)   PRN Meds:.acetaminophen, acetaminophen, albuterol, hydrALAZINE  Diet Orders (From admission, onward)    Start     Ordered   03/14/21 1103  DIET DYS 3 Room service appropriate? Yes with Assist; Fluid consistency: Thin  Diet effective now       Comments: Extra Gravy on chopped/cut meats, potatoes too. Chop spaghetti. Family is in room to help choose but have to ask in person. HOH.  Question Answer Comment  Room service appropriate? Yes with Assist   Fluid consistency: Thin      03/14/21 1106          DVT prophylaxis: enoxaparin (LOVENOX) injection 40 mg Start: 03/11/21 2200     Code Status: DNR  Family Communication: Daughter present at bedside  Status is: Inpatient  Remains inpatient appropriate because:Altered mental status and Inpatient level  of care appropriate due to severity of illness   Dispo: The patient is from: Home              Anticipated d/c is to: Home              Patient currently is not medically stable to d/c.   Difficult to place patient No   Level of care: Progressive Cardiac  Consultants:  Neurology   Procedures:  none  Microbiology  Urine cultures - E coli  Antimicrobials: Vanc / Cefepime    Objective: Vitals:   03/14/21 0432 03/14/21 0739  03/14/21 0804 03/14/21 1153  BP: 112/68 (!) 154/79  (!) 137/57  Pulse: 68 75  85  Resp: 19 16  15   Temp: 98.1 F (36.7 C) 97.9 F (36.6 C)  99.1 F (37.3 C)  TempSrc:      SpO2: 96% 91% 93% 90%  Weight:      Height:        Intake/Output Summary (Last 24 hours) at 03/14/2021 1208 Last data filed at 03/14/2021 0416 Gross per 24 hour  Intake 620 ml  Output 1000 ml  Net -380 ml   Filed Weights   03/11/21 0556  Weight: 75.7 kg    Examination:  Constitutional: NAD Eyes: no scleral icterus ENMT: Mucous membranes are moist.  Neck: normal, supple Respiratory: Diminished at the bases but overall clear without wheezing Cardiovascular: Regular rate and rhythm, no murmurs / rubs / gallops. No LE edema.  Abdomen: non distended, no tenderness. Bowel sounds positive.  Musculoskeletal: no clubbing / cyanosis.  Skin: no rashes Neurologic: No focal deficits  Data Reviewed: I have independently reviewed following labs and imaging studies   CBC: Recent Labs  Lab 03/11/21 0600 03/12/21 0542 03/13/21 0612 03/14/21 0511  WBC 13.6* 14.0* 13.5* 10.8*  NEUTROABS 11.5*  --   --   --   HGB 10.5* 9.6* 10.6* 9.1*  HCT 33.3* 29.0* 33.6* 28.3*  MCV 95.4 95.7 97.4 95.9  PLT 238 198 213 093   Basic Metabolic Panel: Recent Labs  Lab 03/11/21 0600 03/12/21 0542 03/13/21 0612 03/14/21 0511  NA 137 137 137 137  K 4.0 4.4 4.1 3.9  CL 104 105 104 105  CO2 23 24 24 24   GLUCOSE 111* 130* 94 122*  BUN 23 22 21 22   CREATININE 1.25* 1.20* 1.06* 1.02*  CALCIUM 8.9 8.5* 9.2 8.8*   Liver Function Tests: Recent Labs  Lab 03/11/21 0600 03/14/21 0511  AST 19 11*  ALT 12 10  ALKPHOS 74 74  BILITOT 0.7 0.7  PROT 6.4* 5.9*  ALBUMIN 3.2* 2.7*   Coagulation Profile: Recent Labs  Lab 03/11/21 0600  INR 1.1   HbA1C: No results for input(s): HGBA1C in the last 72 hours. CBG: No results for input(s): GLUCAP in the last 168 hours.  Recent Results (from the past 240 hour(s))  Resp Panel  by RT-PCR (Flu A&B, Covid) Nasopharyngeal Swab     Status: None   Collection Time: 03/11/21  6:00 AM   Specimen: Nasopharyngeal Swab; Nasopharyngeal(NP) swabs in vial transport medium  Result Value Ref Range Status   SARS Coronavirus 2 by RT PCR NEGATIVE NEGATIVE Final    Comment: (NOTE) SARS-CoV-2 target nucleic acids are NOT DETECTED.  The SARS-CoV-2 RNA is generally detectable in upper respiratory specimens during the acute phase of infection. The lowest concentration of SARS-CoV-2 viral copies this assay can detect is 138 copies/mL. A negative result does not preclude SARS-Cov-2 infection and should  not be used as the sole basis for treatment or other patient management decisions. A negative result may occur with  improper specimen collection/handling, submission of specimen other than nasopharyngeal swab, presence of viral mutation(s) within the areas targeted by this assay, and inadequate number of viral copies(<138 copies/mL). A negative result must be combined with clinical observations, patient history, and epidemiological information. The expected result is Negative.  Fact Sheet for Patients:  EntrepreneurPulse.com.au  Fact Sheet for Healthcare Providers:  IncredibleEmployment.be  This test is no t yet approved or cleared by the [place] FDA and  has been authorized for detection and/or diagnosis of SARS-CoV-2 by FDA under an Emergency Use Authorization (EUA). This EUA will remain  in effect (meaning this test can be used) for the duration of the COVID-19 declaration under Section 564(b)(1) of the Act, 21 U.S.C.section 360bbb-3(b)(1), unless the authorization is terminated  or revoked sooner.       Influenza A by PCR NEGATIVE NEGATIVE Final   Influenza B by PCR NEGATIVE NEGATIVE Final    Comment: (NOTE) The Xpert Xpress SARS-CoV-2/FLU/RSV plus assay is intended as an aid in the diagnosis of influenza from Nasopharyngeal swab  specimens and should not be used as a sole basis for treatment. Nasal washings and aspirates are unacceptable for Xpert Xpress SARS-CoV-2/FLU/RSV testing.  Fact Sheet for Patients: EntrepreneurPulse.com.au  Fact Sheet for Healthcare Providers: IncredibleEmployment.be  This test is not yet approved or cleared by the [place] FDA and has been authorized for detection and/or diagnosis of SARS-CoV-2 by FDA under an Emergency Use Authorization (EUA). This EUA will remain in effect (meaning this test can be used) for the duration of the COVID-19 declaration under Section 564(b)(1) of the Act, 21 U.S.C. section 360bbb-3(b)(1), unless the authorization is terminated or revoked.  Performed at [facility], [address] [place], [place] [address]   Urine culture     Status: Abnormal   Collection Time: 03/11/21  6:00 AM   Specimen: In/Out Cath Urine  Result Value Ref Range Status   Specimen Description   Final    IN/OUT CATH URINE Performed at [facility], [address] [place], [place] [address]    Special Requests   Final    NONE Performed at [facility], [place], [place] [address]    Culture >=100,000 COLONIES/mL ESCHERICHIA COLI (A)  Final   Report Status 03/13/2021 FINAL  Final   Organism ID, Bacteria ESCHERICHIA COLI (A)  Final      Susceptibility   Escherichia coli - MIC*    AMPICILLIN >=32 RESISTANT Resistant     CEFAZOLIN >=64 RESISTANT Resistant     CEFEPIME <=0.12 SENSITIVE Sensitive     CEFTRIAXONE 0.5 SENSITIVE Sensitive     CIPROFLOXACIN <=0.25 SENSITIVE Sensitive     GENTAMICIN <=1 SENSITIVE Sensitive     IMIPENEM <=0.25 SENSITIVE Sensitive     NITROFURANTOIN <=16 SENSITIVE Sensitive     TRIMETH/SULFA <=20 SENSITIVE Sensitive     AMPICILLIN/SULBACTAM >=32 RESISTANT Resistant     PIP/TAZO 8 SENSITIVE Sensitive     * >=100,000 COLONIES/mL ESCHERICHIA COLI  Blood Culture  (routine x 2)     Status: None (Preliminary result)   Collection Time: 03/11/21  6:01 AM   Specimen: BLOOD  Result Value Ref Range Status   Specimen Description BLOOD LEFT ARM  Final   Special Requests   Final    BOTTLES DRAWN AEROBIC AND ANAEROBIC Blood Culture adequate volume   Culture  Final    NO GROWTH 3 DAYS Performed at [facility], [address] [place], [place] [address]    Report Status PENDING  Incomplete  Blood Culture (routine x 2)     Status: None (Preliminary result)   Collection Time: 03/11/21  6:02 AM   Specimen: BLOOD  Result Value Ref Range Status   Specimen Description BLOOD RIGHT ARM  Final   Special Requests   Final    BOTTLES DRAWN AEROBIC AND ANAEROBIC Blood Culture adequate volume   Culture   Final    NO GROWTH 3 DAYS Performed at [facility], [address], [place], [place] [address]    Report Status PENDING  Incomplete     Radiology Studies: MR BRAIN WO CONTRAST  Result Date: 03/14/2021 CLINICAL DATA:  Initial evaluation for mental status change, unknown cause. EXAM: MRI HEAD WITHOUT CONTRAST TECHNIQUE: Multiplanar, multiecho pulse sequences of the brain and surrounding structures were obtained without intravenous contrast. COMPARISON:  Prior CT from 03/11/2021 and MRI from 01/13/2021. FINDINGS: Brain: Temporal lobe predominant cerebral atrophy. Scattered patchy and confluent T2/FLAIR hyperintensity involving the periventricular and deep white matter both cerebral hemispheres as well as the pons, most consistent with chronic small vessel ischemic disease, mild for age. No abnormal foci of restricted diffusion to suggest acute or subacute ischemia. Gray-white matter differentiation maintained. No encephalomalacia to suggest chronic cortical infarction. No acute intracranial hemorrhage. Approximate 1 cm heterogeneous T2 hypointense lesion with prominent blooming artifact present at the left occipital lobe, suspected to reflect a small  cavernoma (series 13, image 22). No other evidence for acute or chronic intracranial hemorrhage. No other mass lesion, mass effect, or midline shift. Stable ventricular size without hydrocephalus. No extra-axial fluid collection. Pituitary gland suprasellar region normal. Midline structures intact. Vascular: Major intracranial vascular flow voids are maintained. Skull and upper cervical spine: Craniocervical junction within normal limits. Bone marrow signal intensity normal. Focal thinning of the left frontal scalp noted. Scalp soft tissues otherwise unremarkable. Sinuses/Orbits: Globes and orbital soft tissues demonstrate no acute finding. Sequelae of prior bilateral ocular lens replacement. Mild chronic mucosal thickening noted within the ethmoidal air cells. Paranasal sinuses are otherwise clear. Trace right mastoid effusion noted, of doubtful significance. Inner ear structures grossly normal. Other: None. IMPRESSION: 1. No acute intracranial abnormality. 2. Temporal lobe predominant cerebral atrophy with mild chronic small vessel ischemic disease. 3. 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 Electronically Signed   By: Jeannine Boga M.D.   On: 03/14/2021 03:19    Marzetta Board, MD, PhD [facility]  Between 7 am - 7 pm I am available, please contact me via Amion (for emergencies) or Securechat (non urgent messages)  Between 7 pm - 7 am I am not available, please contact night coverage MD/APP via Amion

## 2021-03-14 NOTE — Evaluation (Signed)
Clinical/Bedside Swallow Evaluation Patient Details  Name: [name] MRN: [id number] Date of Birth: 07-18-1926  Today's Date: 03/14/2021 Time: SLP Start Time (ACUTE ONLY): 1010 SLP Stop Time (ACUTE ONLY): 1110 SLP Time Calculation (min) (ACUTE ONLY): 60 min  Past Medical History:  Past Medical History:  Diagnosis Date  . Anxiety   . Arthritis   . Cancer [facility])    uterine and questionable vulvar, s/p hysterectomy  . Chronic headaches   . Cystocele   . Dementia ([place])    able to sign own papers  . Depression   . GERD (gastroesophageal reflux disease)   . Glaucoma   . Hypercholesterolemia   . Hypertension   . Macular degeneration   . Reactive airway disease   . Rectocele   . Right bundle branch block   . Sleep apnea   . Uterine cancer ([place])   . Vertigo    none - several yrs  . Wears dentures    full upper and lower   Past Surgical History:  Past Surgical History:  Procedure Laterality Date  . ABDOMINAL HYSTERECTOMY    . ABDOMINAL HYSTERECTOMY    . APPENDECTOMY    . CHOLECYSTECTOMY    . IMAGE GUIDED SINUS SURGERY N/A 06/20/2015   Procedure: IMAGE GUIDED SINUS SURGERY;  Surgeon: Clyde Canterbury, MD;  Location: [place];  Service: ENT;  Laterality: N/A;  GAVE DISK TO CE CE  . REPLACEMENT TOTAL KNEE Left   . SPHENOIDECTOMY Bilateral 06/20/2015   Procedure: SPHENOIDECTOMY;  Surgeon: Clyde Canterbury, MD;  Location: [place];  Service: ENT;  Laterality: Bilateral;  . uterine cancer    . VULVECTOMY     HPI:  Pt is a 87 y.o. female with medical history significant of hypertension, hyperlipidemia, GERD, depression with anxiety, dementia, uterine cancer (s/p for hysterectomy, OSA not on CPAP, right bundle blockade, vertigo, chronic pain syndrome, anemia, pulmonary hypertension, rhabdomyolysis, anemia, who presents with cough, shortness of breath, fever and chills.  Per her daughter, patient symptoms started yesterday, including fever, chills, cough with little  mucus production, shortness of breath.  Patient seems to be more confused than her baseline and agitated.   MRI: "No acute intracranial abnormality. 2. Temporal lobe predominant cerebral atrophy with mild chronic  small vessel ischemic disease.".  CXR: "Low lung volumes, and increased bibasilar opacity since April but  more compatible with atelectasis than infection.". CXR in April 2022 admit - "no active disease" then.  Admitting dx: Sepsis due to UTI and HCAP w/ Acute respiratory failure with hypoxia.   Assessment / Plan / Recommendation Clinical Impression  Pt appears to present w/ grossly adequate oropharyngeal phase swallow w/ No overt oropharyngeal phase dysphagia noted w/ po trials given. No neuromuscular deficits noted. Pt consumed po trials w/ No overt, clinical s/s of aspiration during po trials. However, pt appears to have impact from declined Pulmonary status and need for increased O2 support(6-8L) to ease Pulmonary demand. Pt does have Baseline deep, congested coughing intermittently at rest as well. Of note, after pt Belched (MOD+) and calmed, her breathing calmed and she rested. No further c/o pain in chest. MD/NSG informed/updated. Pt also has impact from min loose-fitting Dentures (baseline for pt).  She appears at reduced risk for aspiration following general aspiration precautions and taking Rest Breaks during meals to lessen any WOB/SOB. Pt also benefits from Cut, Chopped foods -- avoiding any tougher meats/breads to increase mastication effort/need d/t min loose-fitting Dentures.   During po trials, pt consumed all  consistencies w/ no immediate coughing, decline in vocal quality, or change in respiratory presentation during/post trials from her baseline (congested cough noted intermittently which did not increase in frequency or intensity). Rest Breaks given to avoid sustained WOB from exertion of tasks. Pt was able to maintain a calm RR in the low 20s given Rest Breaks. Oral phase appeared  [facility] w/ timely bolus management, mastication, and control of bolus propulsion for A-P transfer for swallowing. Oral clearing achieved w/ all trial consistencies. OM Exam appeared [facility] w/ no unilateral weakness noted. Speech Clear. Pt fed self by [employer] to drink -- she requires feeding of all foods(baseline now for pt per Husband).   Pt's respiratory effort did appear to increase w/ the effort from the exertion of the tasks(talking included) but calmed again slightly w/ Rest Breaks b/t trials. Pt was encouraged to help Hold Cup for drinking w/ Small, Single sips Slowly to lessen risk for aspiration. Discussed w/ pt/Husband that foods of increased texture would require more oral phase time, mastication effort thus increase respiratory effort. Recommend more cut, chopped foods in diet moistened well. Education given on soft food consistencies/options and need for cohesive foods. Educated both on general aspiration precautions d/t Pulmonary status - the need for frequent Rest Breaks during oral intake; 1 bite or sip at a time.    Recommend Mech Soft diet w/ thin liquids; aspiration precautions; Pills given in Puree for cohesiveness to lessen risk for aspiration d/t Pulmonary decline - Crushed at this time. GERD/REFLUX precautions. ST services can be available for further education while admitted. A Dietician consult and f/u could be beneficial as well. Precautions posted. SLP Visit Diagnosis: Dysphagia, oral phase (R13.11) (baseline Dementia)    Aspiration Risk  Mild aspiration risk;Risk for inadequate nutrition/hydration    Diet Recommendation   Mech Soft diet w/ thin liquids; aspiration precautions to include Rest Breaks frequently during po intake. Pt to Hold Cup to support safe swallowing during drinking. GERD/REFLUX PRECAUTIONS.   Medication Administration: Whole meds with puree (vs Crushed in Puree right now d/t illness)    Other  Recommendations Recommended Consults:  (Dietician f/u) Oral Care  Recommendations: Oral care BID;Oral care before and after PO;Staff/trained caregiver to provide oral care (Denture care) Other Recommendations:  (n/a)   Follow up Recommendations None      Frequency and Duration min 1 x/week  1 week       Prognosis Prognosis for Safe Diet Advancement: Fair (-Good) Barriers to Reach Goals: Cognitive deficits;Time post onset;Severity of deficits      Swallow Study   General Date of Onset: 03/11/21 HPI: Pt is a 87 y.o. female with medical history significant of hypertension, hyperlipidemia, GERD, depression with anxiety, dementia, uterine cancer (s/p for hysterectomy, OSA not on CPAP, right bundle blockade, vertigo, chronic pain syndrome, anemia, pulmonary hypertension, rhabdomyolysis, anemia, who presents with cough, shortness of breath, fever and chills.  Per her daughter, patient symptoms started yesterday, including fever, chills, cough with little mucus production, shortness of breath.  Patient seems to be more confused than her baseline and agitated.   MRI: "No acute intracranial abnormality. 2. Temporal lobe predominant cerebral atrophy with mild chronic  small vessel ischemic disease.".  CXR: "Low lung volumes, and increased bibasilar opacity since April but  more compatible with atelectasis than infection.". CXR in April 2022 admit - "no active disease" then.  Admitting dx: Sepsis due to UTI and HCAP w/ Acute respiratory failure with hypoxia. Type of Study: Bedside Swallow Evaluation Previous  Swallow Assessment: 01/2021; 2017 Diet Prior to this Study: Dysphagia 2 (chopped);Thin liquids (w/ increased texture as tolerates) Temperature Spikes Noted: No (wbc 10.8) Respiratory Status: Nasal cannula (6-8L) History of Recent Intubation: No Behavior/Cognition: Alert;Cooperative;Pleasant mood;Confused;Distractible;Requires cueing (baseline Dementia) Oral Cavity Assessment: Within Functional Limits Oral Care Completed by SLP: Yes Oral Cavity - Dentition:  Dentures, top;Dentures, bottom Vision:  (unsure; more confusion) Self-Feeding Abilities: Needs assist;Needs set up;Total assist (can help Hold Cup to drink) Patient Positioning: Upright in bed (needed support) Baseline Vocal Quality: Normal Volitional Cough: Strong Volitional Swallow: Able to elicit    Oral/Motor/Sensory Function Overall Oral Motor/Sensory Function: Within functional limits   Ice Chips Ice chips: Within functional limits Presentation: Spoon (fed; 2 trials)   Thin Liquid Thin Liquid: Within functional limits Presentation: Self Fed;Straw (w/ hand over hand support) Other Comments: pt can help hold cup when drinking -- min support to help hold cup. Needs to engage w/ task.    Nectar Thick Nectar Thick Liquid: Not tested   Honey Thick Honey Thick Liquid: Not tested   Puree Puree: Within functional limits Presentation: Spoon (fed; 8-9 trials)   Solid     Solid: Impaired (min loose-fitting dentures baseline; increased effort w/) Presentation: Spoon (fed; 4 trials) Oral Phase Impairments: Impaired mastication (min) Oral Phase Functional Implications: Impaired mastication;Prolonged oral transit (min) Pharyngeal Phase Impairments:  (none)        Orinda Kenner, MS, Development worker, international aid Language Pathologist Rehab Services [phone] [facility] 03/14/2021,12:25 PM

## 2021-03-14 NOTE — Plan of Care (Signed)
Problem: Education: Goal: Knowledge of General Education information will improve Description: Including pain rating scale, medication(s)/side effects and non-pharmacologic comfort measures 03/14/2021 1223 by Cristela Blue, RN Outcome: Progressing 03/14/2021 1223 by Cristela Blue, RN Outcome: Progressing   Problem: Health Behavior/Discharge Planning: Goal: Ability to manage health-related needs will improve 03/14/2021 1223 by Cristela Blue, RN Outcome: Progressing 03/14/2021 1223 by Cristela Blue, RN Outcome: Progressing   Problem: Clinical Measurements: Goal: Ability to maintain clinical measurements within normal limits will improve 03/14/2021 1223 by Cristela Blue, RN Outcome: Progressing 03/14/2021 1223 by Cristela Blue, RN Outcome: Progressing Goal: Will remain free from infection 03/14/2021 1223 by Cristela Blue, RN Outcome: Progressing 03/14/2021 1223 by Cristela Blue, RN Outcome: Progressing Goal: Diagnostic test results will improve 03/14/2021 1223 by Cristela Blue, RN Outcome: Progressing 03/14/2021 1223 by Cristela Blue, RN Outcome: Progressing Goal: Respiratory complications will improve 03/14/2021 1223 by Cristela Blue, RN Outcome: Progressing 03/14/2021 1223 by Cristela Blue, RN Outcome: Progressing Goal: Cardiovascular complication will be avoided 03/14/2021 1223 by Cristela Blue, RN Outcome: Progressing 03/14/2021 1223 by Cristela Blue, RN Outcome: Progressing   Problem: Activity: Goal: Risk for activity intolerance will decrease 03/14/2021 1223 by Cristela Blue, RN Outcome: Progressing 03/14/2021 1223 by Cristela Blue, RN Outcome: Progressing   Problem: Nutrition: Goal: Adequate nutrition will be maintained 03/14/2021 1223 by Cristela Blue, RN Outcome: Progressing 03/14/2021 1223 by Cristela Blue, RN Outcome: Progressing   Problem: Coping: Goal: Level of anxiety will decrease 03/14/2021 1223 by Cristela Blue, RN Outcome: Progressing 03/14/2021 1223 by  Cristela Blue, RN Outcome: Progressing   Problem: Elimination: Goal: Will not experience complications related to bowel motility 03/14/2021 1223 by Cristela Blue, RN Outcome: Progressing 03/14/2021 1223 by Cristela Blue, RN Outcome: Progressing Goal: Will not experience complications related to urinary retention 03/14/2021 1223 by Cristela Blue, RN Outcome: Progressing 03/14/2021 1223 by Cristela Blue, RN Outcome: Progressing   Problem: Pain Managment: Goal: General experience of comfort will improve 03/14/2021 1223 by Cristela Blue, RN Outcome: Progressing 03/14/2021 1223 by Cristela Blue, RN Outcome: Progressing   Problem: Safety: Goal: Ability to remain free from injury will improve 03/14/2021 1223 by Cristela Blue, RN Outcome: Progressing 03/14/2021 1223 by Cristela Blue, RN Outcome: Progressing   Problem: Skin Integrity: Goal: Risk for impaired skin integrity will decrease 03/14/2021 1223 by Cristela Blue, RN Outcome: Progressing 03/14/2021 1223 by Cristela Blue, RN Outcome: Progressing   Problem: Education: Goal: Knowledge of General Education information will improve Description: Including pain rating scale, medication(s)/side effects and non-pharmacologic comfort measures 03/14/2021 1223 by Cristela Blue, RN Outcome: Progressing 03/14/2021 1223 by Cristela Blue, RN Outcome: Progressing   Problem: Health Behavior/Discharge Planning: Goal: Ability to manage health-related needs will improve 03/14/2021 1223 by Cristela Blue, RN Outcome: Progressing 03/14/2021 1223 by Cristela Blue, RN Outcome: Progressing   Problem: Clinical Measurements: Goal: Ability to maintain clinical measurements within normal limits will improve 03/14/2021 1223 by Cristela Blue, RN Outcome: Progressing 03/14/2021 1223 by Cristela Blue, RN Outcome: Progressing Goal: Will remain free from infection 03/14/2021 1223 by Cristela Blue, RN Outcome: Progressing 03/14/2021 1223 by Cristela Blue,  RN Outcome: Progressing Goal: Diagnostic test results will improve 03/14/2021 1223 by Cristela Blue, RN Outcome: Progressing 03/14/2021 1223 by Cristela Blue, RN Outcome: Progressing Goal: Respiratory complications will improve 03/14/2021 1223 by Cristela Blue, RN Outcome: Progressing 03/14/2021 1223 by Cristela Blue, RN Outcome: Progressing Goal: Cardiovascular complication will be avoided 03/14/2021 1223 by Cristela Blue, RN Outcome: Progressing 03/14/2021 1223 by Cristela Blue, RN Outcome: Progressing   Problem: Activity: Goal:  Risk for activity intolerance will decrease 03/14/2021 1223 by Cristela Blue, RN Outcome: Progressing 03/14/2021 1223 by Cristela Blue, RN Outcome: Progressing   Problem: Nutrition: Goal: Adequate nutrition will be maintained 03/14/2021 1223 by Cristela Blue, RN Outcome: Progressing 03/14/2021 1223 by Cristela Blue, RN Outcome: Progressing   Problem: Coping: Goal: Level of anxiety will decrease 03/14/2021 1223 by Cristela Blue, RN Outcome: Progressing 03/14/2021 1223 by Cristela Blue, RN Outcome: Progressing   Problem: Elimination: Goal: Will not experience complications related to bowel motility 03/14/2021 1223 by Cristela Blue, RN Outcome: Progressing 03/14/2021 1223 by Cristela Blue, RN Outcome: Progressing Goal: Will not experience complications related to urinary retention 03/14/2021 1223 by Cristela Blue, RN Outcome: Progressing 03/14/2021 1223 by Cristela Blue, RN Outcome: Progressing   Problem: Pain Managment: Goal: General experience of comfort will improve 03/14/2021 1223 by Cristela Blue, RN Outcome: Progressing 03/14/2021 1223 by Cristela Blue, RN Outcome: Progressing   Problem: Safety: Goal: Ability to remain free from injury will improve 03/14/2021 1223 by Cristela Blue, RN Outcome: Progressing 03/14/2021 1223 by Cristela Blue, RN Outcome: Progressing   Problem: Skin Integrity: Goal: Risk for impaired skin integrity will  decrease 03/14/2021 1223 by Cristela Blue, RN Outcome: Progressing 03/14/2021 1223 by Cristela Blue, RN Outcome: Progressing

## 2021-03-15 ENCOUNTER — Inpatient Hospital Stay: Payer: Medicare (Managed Care)

## 2021-03-15 LAB — CBC
HCT: 31.2 % — ABNORMAL LOW (ref 36.0–46.0)
Hemoglobin: 9.8 g/dL — ABNORMAL LOW (ref 12.0–15.0)
MCH: 29.5 pg (ref 26.0–34.0)
MCHC: 31.4 g/dL (ref 30.0–36.0)
MCV: 94 fL (ref 80.0–100.0)
Platelets: 262 10*3/uL (ref 150–400)
RBC: 3.32 MIL/uL — ABNORMAL LOW (ref 3.87–5.11)
RDW: 13.2 % (ref 11.5–15.5)
WBC: 9.7 10*3/uL (ref 4.0–10.5)
nRBC: 0 % (ref 0.0–0.2)

## 2021-03-15 LAB — BASIC METABOLIC PANEL
Anion gap: 7 (ref 5–15)
BUN: 21 mg/dL (ref 8–23)
CO2: 25 mmol/L (ref 22–32)
Calcium: 8.7 mg/dL — ABNORMAL LOW (ref 8.9–10.3)
Chloride: 104 mmol/L (ref 98–111)
Creatinine, Ser: 0.94 mg/dL (ref 0.44–1.00)
GFR, Estimated: 57 mL/min — ABNORMAL LOW (ref >60)
Glucose, Bld: 98 mg/dL (ref 70–99)
Potassium: 3.7 mmol/L (ref 3.5–5.1)
Sodium: 136 mmol/L (ref 135–145)

## 2021-03-15 LAB — MRSA PCR SCREENING: MRSA by PCR: POSITIVE — AB

## 2021-03-15 MED ORDER — MUPIROCIN 2 % EX OINT
1.0000 "application " | TOPICAL_OINTMENT | Freq: Two times a day (BID) | CUTANEOUS | Status: AC
  Administered 2021-03-15 – 2021-03-19 (×10): 1 via NASAL
  Filled 2021-03-15 (×2): qty 22

## 2021-03-15 MED ORDER — POLYETHYLENE GLYCOL 3350 17 G PO PACK
17.0000 g | PACK | Freq: Every day | ORAL | Status: DC
  Administered 2021-03-15 – 2021-03-20 (×4): 17 g via ORAL
  Filled 2021-03-15 (×4): qty 1

## 2021-03-15 MED ORDER — CHLORHEXIDINE GLUCONATE CLOTH 2 % EX PADS
6.0000 | MEDICATED_PAD | Freq: Every day | CUTANEOUS | Status: DC
  Administered 2021-03-18 – 2021-03-20 (×3): 6 via TOPICAL

## 2021-03-15 MED ORDER — BENZONATATE 100 MG PO CAPS
200.0000 mg | ORAL_CAPSULE | Freq: Three times a day (TID) | ORAL | Status: DC | PRN
  Administered 2021-03-15: 200 mg via ORAL
  Filled 2021-03-15: qty 2

## 2021-03-15 MED ORDER — SODIUM CHLORIDE 0.9 % IV SOLN
2.0000 g | Freq: Two times a day (BID) | INTRAVENOUS | Status: DC
  Administered 2021-03-15 – 2021-03-16 (×2): 2 g via INTRAVENOUS
  Filled 2021-03-15 (×3): qty 2

## 2021-03-15 MED ORDER — MIRTAZAPINE 15 MG PO TBDP
15.0000 mg | ORAL_TABLET | Freq: Every day | ORAL | Status: DC
  Administered 2021-03-15: 15 mg via ORAL
  Filled 2021-03-15 (×2): qty 1

## 2021-03-15 MED ORDER — VANCOMYCIN HCL 1750 MG/350ML IV SOLN: 1750.0000 mg | INTRAVENOUS | Status: DC

## 2021-03-15 NOTE — Consult Note (Signed)
Pharmacy Antibiotic Note  [name] is a 87 y.o. female admitted on 03/11/2021 with HCAP.  Pharmacy has been consulted for cefepime/vancomcyin dosing.  Plan:  Will follow with  Adjusting dose of Vancomycin to 1750 mg IV Q 48 hrs.  Goal AUC 400-550. Expected AUC: 505.3 Expected Css: 9.5 SCr used: 0.94 (Of note, MRSA PCR collected was positive)  Will adjust Cefepime to  2g q12h  Height: 5\' 4"  (162.6 cm) Weight: 75.7 kg (166 lb 12.8 oz) IBW/kg (Calculated) : 54.7  Temp (24hrs), Avg:98.3 F (36.8 C), Min:97.4 F (36.3 C), Max:99 F (37.2 C)  Recent Labs  Lab 03/11/21 0600 03/11/21 0728 03/12/21 0542 03/13/21 0612 03/14/21 0511 03/15/21 0601  WBC 13.6*  --  14.0* 13.5* 10.8* 9.7  CREATININE 1.25*  --  1.20* 1.06* 1.02* 0.94  LATICACIDVEN 2.0* 1.6 1.3  --   --   --      Estimated Creatinine Clearance: 38 mL/min (by C-G formula based on SCr of 0.94 mg/dL).    Allergies  Allergen Reactions   Ambien [Zolpidem Tartrate] Other (See Comments)    "hallucination"   Codeine Itching    Antimicrobials this admission: Metronidazole 6/5 x 1  Vancomcyin 6/5 >> Cefepime 6/5 >>  Dose adjustments this admission: none  Microbiology results: 6/5 BCx: NG x 4 days 6/5 UCx: E.coli(suspected contaminant, no urinary symptoms) 6/5 Sputum: pending  6/5 MRSA PCR: positive  Thank you for allowing pharmacy to be a part of this patient's care.  Pearla Dubonnet, PharmD Clinical Pharmacist 03/15/2021 2:31 PM

## 2021-03-15 NOTE — Evaluation (Signed)
Physical Therapy Evaluation Patient Details Name: [name] MRN: [id number] DOB: 17-Feb-1926 Today's Date: 03/15/2021   History of Present Illness  Patient is a 87 year old female with HTN, HLD, depression/anxiety, dementia, uterine cancer status post hysterectomy, OSA not on CPAP, RBBB, comes into the hospital with cough, shortness of breath, fever and chills.  Chest x-ray was concerning for pneumonia, she was placed on antibiotics and admitted to the hospital.  Hospital course complicated by somnolence/metabolic encephalopathy  Clinical Impression  Patient evaluated with daughter present at the bedside. Daughter confirms pre-morbid level of functional mobility and that patient is essentially non-ambulatory and uses a sit to stand lift for out of bed activity at baseline. Patient is in the wheelchair most of the day at home and participates in the [facility] program.  Patient is pleasantly confused but cooperative and able to follow single step commands with extra time. Patient required assistance for bed mobility with fair sitting balance. Sitting tolerance limited to approximately 5 minutes with endurance impaired for sustained activity. Patient with productive cough with sitting upright with Sp02 88-89% on 4 L02 with activity. Recommend to continue PT to maximize independence and decrease caregiver burden in preparation for discharge back to previous home environment with assistance from family/PACE.     Follow Up Recommendations Supervision/Assistance - 24 hour;Home health PT    Equipment Recommendations  None recommended by PT    Recommendations for Other Services       Precautions / Restrictions Precautions Precautions: Fall Restrictions Weight Bearing Restrictions: No      Mobility  Bed Mobility Overal bed mobility: Needs Assistance Bed Mobility: Supine to Sit;Sit to Supine     Supine to sit: Min assist Sit to supine: Min assist   General bed mobility comments: assistance  for trunk support to sit upright and assistance for LE support to return to bed. multimodal cues required    Transfers                 General transfer comment: not attempted as patient usually uses a lift to get out of bed at baseline  Ambulation/Gait             General Gait Details: not assessed as patient is non ambulatory at baseline  Stairs            Wheelchair Mobility    Modified Rankin (Stroke Patients Only)       Balance Overall balance assessment: Needs assistance Sitting-balance support: Single extremity supported Sitting balance-Leahy Scale: Fair Sitting balance - Comments: occasional posterior lean, however no gross loss of balance noted. Sp02 88-89% on 4 L02 with activity. sitting tolerance approximately 5 minutes                                     Pertinent Vitals/Pain      Home Living Family/patient expects to be discharged to:: Private residence Living Arrangements: Spouse/significant other Available Help at Discharge: Family;Available 24 hours/day (Patient goes to [facility] program until 5 pm during the day) Type of Home: House Home Access: Ramped entrance     Home Layout: One level Home Equipment: Bedside commode;Wheelchair - manual;Hospital bed (sit to stand lift and a lift chair) Additional Comments: patient is blind per daughter report    Prior Function Level of Independence: Needs assistance   Gait / Transfers Assistance Needed: patient uses a lift for out of bed activity, including to bed side  commode and to wheelchair. she does not ambulate at baseline and needs assistance for wheelchair management. she does sut up in the wheelchair at the PACE progam for most of the day  ADL's / Homemaking Assistance Needed: patient needs assistance for all ADLs including feeding        Hand Dominance        Extremity/Trunk Assessment   Upper Extremity Assessment Upper Extremity Assessment: Generalized weakness (limited  active shoulder flexion)    Lower Extremity Assessment Lower Extremity Assessment: Generalized weakness (patient able to activate hip/knee/ankle movement while supine in bed. endurance impaired for sustained activity)       Communication   Communication: No difficulties  Cognition Arousal/Alertness: Awake/alert Behavior During Therapy: WFL for tasks assessed/performed Overall Cognitive Status: History of cognitive impairments - at baseline                                 General Comments: patient is able to follow single step commands with increased time. patient is oriented to person, place with cues, not oriented to time or situation      General Comments      Exercises     Assessment/Plan    PT Assessment Patient needs continued PT services  PT Problem List Decreased range of motion;Decreased strength;Decreased balance;Decreased activity tolerance;Decreased mobility;Decreased knowledge of use of DME;Decreased safety awareness;Decreased knowledge of precautions       PT Treatment Interventions DME instruction;Functional mobility training;Therapeutic activities;Therapeutic exercise;Balance training;Patient/family education    PT Goals (Current goals can be found in the Care Plan section)  Acute Rehab PT Goals Patient Stated Goal: to go home PT Goal Formulation: With patient/family Time For Goal Achievement: 03/29/21 Potential to Achieve Goals: Fair    Frequency Min 2X/week   Barriers to discharge        Co-evaluation               AM-PAC PT "6 Clicks" Mobility  Outcome Measure Help needed turning from your back to your side while in a flat bed without using bedrails?: A Lot Help needed moving from lying on your back to sitting on the side of a flat bed without using bedrails?: A Lot Help needed moving to and from a bed to a chair (including a wheelchair)?: Total Help needed standing up from a chair using your arms (e.g., wheelchair or bedside  chair)?: Total Help needed to walk in hospital room?: Total Help needed climbing 3-5 steps with a railing? : Total 6 Click Score: 8    End of Session Equipment Utilized During Treatment: Oxygen Activity Tolerance: Patient tolerated treatment well;Patient limited by fatigue Patient left: in bed;with call bell/phone within reach;with bed alarm set;with family/visitor present (daughter at the bedside)   PT Visit Diagnosis: Muscle weakness (generalized) (M62.81)    Time: 8675-4492 PT Time Calculation (min) (ACUTE ONLY): 23 min   Charges:   PT Evaluation $PT Eval Moderate Complexity: 1 Mod PT Treatments $Therapeutic Activity: 8-22 mins       Minna Merritts, PT, MPT   Percell Locus 03/15/2021, 9:25 AM

## 2021-03-15 NOTE — Progress Notes (Signed)
PROGRESS NOTE  [name] HAL:[id number] DOB: 12-14-1925 DOA: 03/11/2021 PCP: [facility] [place]   LOS: 4 days   Brief Narrative / Interim history: 87 year old female with HTN, HLD, depression/anxiety, dementia, uterine cancer status post hysterectomy, OSA not on CPAP, RBBB, comes into the hospital with cough, shortness of breath, fever and chills.  Chest x-ray was concerning for pneumonia, she was placed on antibiotics and admitted to the hospital.  Hospital course complicated by somnolence/metabolic encephalopathy  Subjective / 24h Interval events: She is alert today, confused but seems to be doing very well.  Denies any chest pain, denies any shortness of breath  Assessment & Plan: Principal Problem Sepsis due to pneumonia, acute hypoxic respiratory failure-patient was started on antibiotics with vancomycin and cefepime, seems to be overall improving.  Cultures are negative on day 4, narrow antibiotics pending MRSA PCR screening which was reordered today.  Plan for total of 7-day course -Currently on 4 L oxygen, improving from 8 yesterday  Active Problems Bacteriuria-given underlying dementia not sure whether she had symptoms of this is colonization.  She is covered as per #1  Acute metabolic encephalopathy, somnolence on 6/7-CT head was unremarkable.  She underwent an MRI which is negative for stroke.  Neurology saw the patient believes this is related to her infectious process.  She is also on multiple sedating medications at home, and now she is off her melatonin, trazodone, Remeron.  Monitor clinically, seems to be alert  Acute kidney injury-creatinine overall stable  Dementia-noted  Essential hypertension-hold lisinopril due to AKI  Scheduled Meds:  brimonidine  1 drop Both Eyes BID   calcium-vitamin D  1 tablet Oral Daily   cholecalciferol  1,000 Units Oral Daily   enoxaparin (LOVENOX) injection  40 mg Subcutaneous Q24H   feeding supplement   237 mL Oral BID BM   gabapentin  100 mg Oral QHS   guaiFENesin  1,200 mg Oral BID   ipratropium-albuterol  3 mL Nebulization BID   mometasone-formoterol  2 puff Inhalation BID   multivitamin with minerals  1 tablet Oral Daily   oxyCODONE  5 mg Oral BID   pantoprazole  40 mg Oral BID AC   predniSONE  20 mg Oral Q breakfast   senna  2 tablet Oral QHS   sertraline  150 mg Oral Daily   Travoprost (BAK Free)  1 drop Both Eyes QHS   vitamin B-12  1,000 mcg Oral Daily   Continuous Infusions:  ceFEPime (MAXIPIME) IV 2 g (03/15/21 0819)   vancomycin 1,250 mg (03/13/21 1019)   PRN Meds:.acetaminophen, acetaminophen, albuterol, hydrALAZINE  Diet Orders (From admission, onward)     Start     Ordered   03/14/21 1103  DIET DYS 3 Room service appropriate? Yes with Assist; Fluid consistency: Thin  Diet effective now       Comments: Extra Gravy on chopped/cut meats, potatoes too. Chop spaghetti. Family is in room to help choose but have to ask in person. HOH.  Question Answer Comment  Room service appropriate? Yes with Assist   Fluid consistency: Thin      03/14/21 1106            DVT prophylaxis: enoxaparin (LOVENOX) injection 40 mg Start: 03/11/21 2200     Code Status: DNR  Family Communication: Daughter present at bedside  Status is: Inpatient  Remains inpatient appropriate because:Altered mental status and Inpatient level of care appropriate due to severity of illness   Dispo: The patient  is from: Home              Anticipated d/c is to: Home              Patient currently is not medically stable to d/c.   Difficult to place patient No   Level of care: Progressive Cardiac  Consultants:  Neurology   Procedures:  none  Microbiology  Urine cultures - E coli  Antimicrobials: Vanc / Cefepime    Objective: Vitals:   03/14/21 2050 03/14/21 2107 03/15/21 0415 03/15/21 0812  BP: (!) 158/79  (!) 158/76   Pulse: 73  70 81  Resp: 19  20 16   Temp: 98.4 F (36.9 C)   98 F (36.7 C)   TempSrc:   Oral   SpO2: 98% 94% 99% 94%  Weight:      Height:        Intake/Output Summary (Last 24 hours) at 03/15/2021 1018 Last data filed at 03/14/2021 1900 Gross per 24 hour  Intake 240 ml  Output 650 ml  Net -410 ml    Filed Weights   03/11/21 0556  Weight: 75.7 kg    Examination:  Constitutional: No distress Eyes: No icterus ENMT: mmm Neck: normal, supple Respiratory: Diminished at the bases but overall clear Cardiovascular: Regular rate and rhythm, no murmurs, no edema Abdomen: Soft, NT, ND, bowel sounds positive Musculoskeletal: no clubbing / cyanosis.  Skin: no rashes Neurologic: Nonfocal  Data Reviewed: I have independently reviewed following labs and imaging studies   CBC: Recent Labs  Lab 03/11/21 0600 03/12/21 0542 03/13/21 0612 03/14/21 0511 03/15/21 0601  WBC 13.6* 14.0* 13.5* 10.8* 9.7  NEUTROABS 11.5*  --   --   --   --   HGB 10.5* 9.6* 10.6* 9.1* 9.8*  HCT 33.3* 29.0* 33.6* 28.3* 31.2*  MCV 95.4 95.7 97.4 95.9 94.0  PLT 238 198 213 228 683    Basic Metabolic Panel: Recent Labs  Lab 03/11/21 0600 03/12/21 0542 03/13/21 0612 03/14/21 0511 03/15/21 0601  NA 137 137 137 137 136  K 4.0 4.4 4.1 3.9 3.7  CL 104 105 104 105 104  CO2 23 24 24 24 25   GLUCOSE 111* 130* 94 122* 98  BUN 23 22 21 22 21   CREATININE 1.25* 1.20* 1.06* 1.02* 0.94  CALCIUM 8.9 8.5* 9.2 8.8* 8.7*    Liver Function Tests: Recent Labs  Lab 03/11/21 0600 03/14/21 0511  AST 19 11*  ALT 12 10  ALKPHOS 74 74  BILITOT 0.7 0.7  PROT 6.4* 5.9*  ALBUMIN 3.2* 2.7*    Coagulation Profile: Recent Labs  Lab 03/11/21 0600  INR 1.1    HbA1C: No results for input(s): HGBA1C in the last 72 hours. CBG: No results for input(s): GLUCAP in the last 168 hours.  Recent Results (from the past 240 hour(s))  Resp Panel by RT-PCR (Flu A&B, Covid) Nasopharyngeal Swab     Status: None   Collection Time: 03/11/21  6:00 AM   Specimen: Nasopharyngeal Swab;  Nasopharyngeal(NP) swabs in vial transport medium  Result Value Ref Range Status   SARS Coronavirus 2 by RT PCR NEGATIVE NEGATIVE Final    Comment: (NOTE) SARS-CoV-2 target nucleic acids are NOT DETECTED.  The SARS-CoV-2 RNA is generally detectable in upper respiratory specimens during the acute phase of infection. The lowest concentration of SARS-CoV-2 viral copies this assay can detect is 138 copies/mL. A negative result does not preclude SARS-Cov-2 infection and should not be used as the sole basis  for treatment or other patient management decisions. A negative result may occur with  improper specimen collection/handling, submission of specimen other than nasopharyngeal swab, presence of viral mutation(s) within the areas targeted by this assay, and inadequate number of viral copies(<138 copies/mL). A negative result must be combined with clinical observations, patient history, and epidemiological information. The expected result is Negative.  Fact Sheet for Patients:  EntrepreneurPulse.com.au  Fact Sheet for Healthcare Providers:  IncredibleEmployment.be  This test is no t yet approved or cleared by the [place] FDA and  has been authorized for detection and/or diagnosis of SARS-CoV-2 by FDA under an Emergency Use Authorization (EUA). This EUA will remain  in effect (meaning this test can be used) for the duration of the COVID-19 declaration under Section 564(b)(1) of the Act, 21 U.S.C.section 360bbb-3(b)(1), unless the authorization is terminated  or revoked sooner.       Influenza A by PCR NEGATIVE NEGATIVE Final   Influenza B by PCR NEGATIVE NEGATIVE Final    Comment: (NOTE) The Xpert Xpress SARS-CoV-2/FLU/RSV plus assay is intended as an aid in the diagnosis of influenza from Nasopharyngeal swab specimens and should not be used as a sole basis for treatment. Nasal washings and aspirates are unacceptable for Xpert Xpress  SARS-CoV-2/FLU/RSV testing.  Fact Sheet for Patients: EntrepreneurPulse.com.au  Fact Sheet for Healthcare Providers: IncredibleEmployment.be  This test is not yet approved or cleared by the [place] FDA and has been authorized for detection and/or diagnosis of SARS-CoV-2 by FDA under an Emergency Use Authorization (EUA). This EUA will remain in effect (meaning this test can be used) for the duration of the COVID-19 declaration under Section 564(b)(1) of the Act, 21 U.S.C. section 360bbb-3(b)(1), unless the authorization is terminated or revoked.  Performed at [facility], [address] [place], [place] [address]   Urine culture     Status: Abnormal   Collection Time: 03/11/21  6:00 AM   Specimen: In/Out Cath Urine  Result Value Ref Range Status   Specimen Description   Final    IN/OUT CATH URINE Performed at [facility], [address] [place], [place] [address]    Special Requests   Final    NONE Performed at [facility], [place],  [address]    Culture >=100,000 COLONIES/mL ESCHERICHIA COLI (A)  Final   Report Status 03/13/2021 FINAL  Final   Organism ID, Bacteria ESCHERICHIA COLI (A)  Final      Susceptibility   Escherichia coli - MIC*    AMPICILLIN >=32 RESISTANT Resistant     CEFAZOLIN >=64 RESISTANT Resistant     CEFEPIME <=0.12 SENSITIVE Sensitive     CEFTRIAXONE 0.5 SENSITIVE Sensitive     CIPROFLOXACIN <=0.25 SENSITIVE Sensitive     GENTAMICIN <=1 SENSITIVE Sensitive     IMIPENEM <=0.25 SENSITIVE Sensitive     NITROFURANTOIN <=16 SENSITIVE Sensitive     TRIMETH/SULFA <=20 SENSITIVE Sensitive     AMPICILLIN/SULBACTAM >=32 RESISTANT Resistant     PIP/TAZO 8 SENSITIVE Sensitive     * >=100,000 COLONIES/mL ESCHERICHIA COLI  Blood Culture (routine x 2)     Status: None (Preliminary result)   Collection Time: 03/11/21  6:01 AM   Specimen: BLOOD  Result Value Ref  Range Status   Specimen Description BLOOD LEFT ARM  Final   Special Requests   Final    BOTTLES DRAWN AEROBIC AND ANAEROBIC Blood Culture adequate volume   Culture   Final    NO GROWTH  4 DAYS Performed at [facility], [address] [place], [place] [address]    Report Status PENDING  Incomplete  Blood Culture (routine x 2)     Status: None (Preliminary result)   Collection Time: 03/11/21  6:02 AM   Specimen: BLOOD  Result Value Ref Range Status   Specimen Description BLOOD RIGHT ARM  Final   Special Requests   Final    BOTTLES DRAWN AEROBIC AND ANAEROBIC Blood Culture adequate volume   Culture   Final    NO GROWTH 4 DAYS Performed at [facility], [address] [place],  [address]    Report Status PENDING  Incomplete     Radiology Studies: No results found.   Marzetta Board, MD, PhD [facility]  Between 7 am - 7 pm I am available, please contact me via Amion (for emergencies) or Securechat (non urgent messages)  Between 7 pm - 7 am I am not available, please contact night coverage MD/APP via Amion

## 2021-03-16 LAB — CBC
HCT: 29.4 % — ABNORMAL LOW (ref 36.0–46.0)
Hemoglobin: 9.4 g/dL — ABNORMAL LOW (ref 12.0–15.0)
MCH: 30.4 pg (ref 26.0–34.0)
MCHC: 32 g/dL (ref 30.0–36.0)
MCV: 95.1 fL (ref 80.0–100.0)
Platelets: 260 10*3/uL (ref 150–400)
RBC: 3.09 MIL/uL — ABNORMAL LOW (ref 3.87–5.11)
RDW: 13.2 % (ref 11.5–15.5)
WBC: 9.3 10*3/uL (ref 4.0–10.5)
nRBC: 0 % (ref 0.0–0.2)

## 2021-03-16 LAB — BASIC METABOLIC PANEL
Anion gap: 8 (ref 5–15)
BUN: 21 mg/dL (ref 8–23)
CO2: 24 mmol/L (ref 22–32)
Calcium: 8.5 mg/dL — ABNORMAL LOW (ref 8.9–10.3)
Chloride: 106 mmol/L (ref 98–111)
Creatinine, Ser: 0.96 mg/dL (ref 0.44–1.00)
GFR, Estimated: 56 mL/min — ABNORMAL LOW (ref >60)
Glucose, Bld: 100 mg/dL — ABNORMAL HIGH (ref 70–99)
Potassium: 3.6 mmol/L (ref 3.5–5.1)
Sodium: 138 mmol/L (ref 135–145)

## 2021-03-16 MED ORDER — DOXYCYCLINE HYCLATE 100 MG PO TABS
100.0000 mg | ORAL_TABLET | Freq: Two times a day (BID) | ORAL | Status: AC
  Administered 2021-03-16 – 2021-03-19 (×8): 100 mg via ORAL
  Filled 2021-03-16 (×8): qty 1

## 2021-03-16 MED ORDER — MIRTAZAPINE 15 MG PO TBDP
15.0000 mg | ORAL_TABLET | Freq: Two times a day (BID) | ORAL | Status: DC | PRN
  Administered 2021-03-16 – 2021-03-18 (×3): 15 mg via ORAL
  Filled 2021-03-16 (×6): qty 1

## 2021-03-16 NOTE — Progress Notes (Signed)
   03/16/21 0900  Clinical Encounter Type  Visited With Patient not available  Visit Type Initial  Referral From  (Rounding)  Recommendations will return for visit later  Chaplain Burris attempted to visit but PT not available at this time.

## 2021-03-16 NOTE — Progress Notes (Addendum)
Speech Language Pathology Treatment: Dysphagia  Patient Details Name: [name] MRN: [id number] DOB: 01/11/1926 Today's Date: 03/16/2021 Time: 2774-1287 SLP Time Calculation (min) (ACUTE ONLY): 35 min  Assessment / Plan / Recommendation Clinical Impression  Pt seen for ongoing assessment of swallowing. She appears to present w/ less deep, congested cough at rest; noted O2 needs have decreased from 6-8L down to 4L. She has full Dentures placed. Husband present. Pt declined trials of her breakfast foods/meal -- Husband stated she "spit it all out" each time he tried to feed her earlier. She was holding her cup drinking thin liquids. NSG present to give po meds -- Pills Crushed in Puree. Suspect pt's oral phase deficits are directly related to her Baseline Dementia. Pt was verbal, tangential statements. She was able to follow basic instructions w/ verbal/tactile cues/guide. NSG reported good toleration of Crushed pills in Puree though w/ some dislike. Pt is afebrile; wbc wnl. Pt and Husband explained general aspiration precautions and given hands-on explanation re: taking small sips slowly; sitting more upright vs sloughed to eat/drink - pt assisted w/ positioning d/t weakness.  Pt consumed trials of thin liquids via Straw and purees w/ no overt clinical s/s of aspiration noted w/ any consistency; respiratory status remained calm and unlabored, vocal quality clear b/t trials. (Pt exhibited a deep, congested cough x1 at end of trials as she did at Baseline and when moving in bed -- it did appear related to any po's. Suspect related more to increased congestion (see CXR results) and lying in bed -- pt has not been using the incentive spirometer or getting out of bed per report. Discussed w/ MD.) Pt held own Cup when drinking thin liquids; setup support and cues given for her to Hold the Cup. She appeared to follow instructions for single, small sips slowly - intermittent verbal cues given w/ light  tactile stim on Cup to take a break when needed. Oral phase appeared [facility] for bolus management and timely A-P transfer for swallowing of trials accepted; oral clearing achieved w/ all consistencies.  Suspect pt appears close to/at her baseline re: swallowing. She benefits from support at meals to self-engage and initiate feeding tasks and monitoring during meals to reduce risk for aspiration. Education given to pt/Husband on food/solid consistencies and preparation to entice oral intake; find favoriate foods to increase interest/intake; general aspiration precautions including sitting fully upright for oral intake; continue Pills Crushed in Puree for safer swallowing   Recommend a more Mech Soft consistency diet w/ well-Cut meats, moistened foods -- trying to allow less minced foods to allow pt to recognize foods to entice eating; Thin liquids. Recommend general aspiration precautions, Support at meals to setup trays and help pt engage/initiate self-feeding. Reduce distractions at meals. Supervision at meals for follow through. Pills CRUSHED in Puree for safer, easier swallowing d/t Cognitive decline. Education given on Pills in Puree; food consistencies and easy to eat options; general aspiration precautions to NSG/pt. NSG agreed. Recommend Dietician f/u for support.       HPI HPI: Pt is a 87 y.o. female with medical history significant of hypertension, hyperlipidemia, GERD, depression with anxiety, dementia, uterine cancer (s/p for hysterectomy, OSA not on CPAP, right bundle blockade, vertigo, chronic pain syndrome, anemia, pulmonary hypertension, rhabdomyolysis, anemia, who presents with cough, shortness of breath, fever and chills.  Per her daughter, patient symptoms started yesterday, including fever, chills, cough with little mucus production, shortness of breath.  Patient seems to be more confused than her baseline  and agitated.   MRI: "No acute intracranial abnormality. 2. Temporal lobe predominant  cerebral atrophy with mild chronic  small vessel ischemic disease.".  CXR: "Low lung volumes, and increased bibasilar opacity since April but  more compatible with atelectasis than infection.". CXR in April 2022 admit - "no active disease" then.  Admitting dx: Sepsis due to UTI and HCAP w/ Acute respiratory failure with hypoxia.      SLP Plan  All goals met       Recommendations  Diet recommendations: Dysphagia 3 (mechanical soft);Thin liquid (to stimulate interest -- but cut/chop meats/solids for ease of mastication/intake) Liquids provided via: Cup;Straw (monitor) Medication Administration: Crushed with puree (for safer swallowing) Supervision: Patient able to self feed;Staff to assist with self feeding;Intermittent supervision to cue for compensatory strategies Compensations: Minimize environmental distractions;Slow rate;Small sips/bites;Lingual sweep for clearance of pocketing;Follow solids with liquid Postural Changes and/or Swallow Maneuvers: Out of bed for meals;Seated upright 90 degrees;Upright 30-60 min after meal                General recommendations:  ([place] f/u for support d/t poor intake) Oral Care Recommendations: Oral care BID;Oral care before and after PO;Staff/trained caregiver to provide oral care ([place] care) Follow up Recommendations: None SLP Visit Diagnosis: Dysphagia, oral phase (R13.11) (baseline Dementia) Plan: All goals met       GO                 Orinda Kenner, MS, CCC-SLP Speech Language Pathologist Rehab Services [phone] [facility] 03/16/2021, 11:05 AM

## 2021-03-16 NOTE — Progress Notes (Signed)
PROGRESS NOTE  [name] XBD:[id number] DOB: 01/03/1926 DOA: 03/11/2021 PCP: [facility] [place]   LOS: 5 days   Brief Narrative / Interim history: 87 year old female with HTN, HLD, depression/anxiety, dementia, uterine cancer status post hysterectomy, OSA not on CPAP, RBBB, comes into the hospital with cough, shortness of breath, fever and chills.  Chest x-ray was concerning for pneumonia, she was placed on antibiotics and admitted to the hospital.  Hospital course complicated by somnolence/metabolic encephalopathy  Subjective / 24h Interval events: Remains alert. Confused. Sleeping with the O2 off when I entered the room, complains of shortness of breath on waking up.  Assessment & Plan: Principal Problem Sepsis due to pneumonia, acute hypoxic respiratory failure-patient was started on antibiotics with vancomycin and cefepime, seems to be overall improving.  Cultures are negative on day 5, narrow antibiotics, will plan for 3 more days including the weekend. Narrow today -remains on 4L, clinically improving. Mobilize more   Active Problems Bacteriuria-given underlying dementia not sure whether she had symptoms of this is colonization.  She is covered as per #1  Acute metabolic encephalopathy, somnolence on 6/7-CT head was unremarkable.  She underwent an MRI which is negative for stroke.  Neurology saw the patient believes this is related to her infectious process.  She is also on multiple sedating medications at home, and now she is off her melatonin, trazodone, Remeron.  Monitor clinically, seems to be alert  Acute kidney injury-creatinine overall stable  Dementia-noted  Essential hypertension-hold lisinopril due to AKI  Scheduled Meds:  brimonidine  1 drop Both Eyes BID   calcium-vitamin D  1 tablet Oral Daily   Chlorhexidine Gluconate Cloth  6 each Topical Q0600   cholecalciferol  1,000 Units Oral Daily   doxycycline  100 mg Oral Q12H    enoxaparin (LOVENOX) injection  40 mg Subcutaneous Q24H   feeding supplement  237 mL Oral BID BM   gabapentin  100 mg Oral QHS   guaiFENesin  1,200 mg Oral BID   mirtazapine  15 mg Oral QHS   mometasone-formoterol  2 puff Inhalation BID   multivitamin with minerals  1 tablet Oral Daily   mupirocin ointment  1 application Nasal BID   oxyCODONE  5 mg Oral BID   pantoprazole  40 mg Oral BID AC   polyethylene glycol  17 g Oral Daily   predniSONE  20 mg Oral Q breakfast   senna  2 tablet Oral QHS   sertraline  150 mg Oral Daily   Travoprost (BAK Free)  1 drop Both Eyes QHS   vitamin B-12  1,000 mcg Oral Daily   Continuous Infusions:   PRN Meds:.acetaminophen, acetaminophen, albuterol, benzonatate, hydrALAZINE  Diet Orders (From admission, onward)     Start     Ordered   03/14/21 1103  DIET DYS 3 Room service appropriate? Yes with Assist; Fluid consistency: Thin  Diet effective now       Comments: Extra Gravy on chopped/cut meats, potatoes too. Chop spaghetti. Family is in room to help choose but have to ask in person. HOH.  Question Answer Comment  Room service appropriate? Yes with Assist   Fluid consistency: Thin      03/14/21 1106            DVT prophylaxis: enoxaparin (LOVENOX) injection 40 mg Start: 03/11/21 2200     Code Status: DNR  Family Communication: Daughter present at bedside  Status is: Inpatient  Remains inpatient appropriate because:Altered mental status  and Inpatient level of care appropriate due to severity of illness   Dispo: The patient is from: Home              Anticipated d/c is to: Home              Patient currently is not medically stable to d/c.   Difficult to place patient No   Level of care: Med-Surg  Consultants:  Neurology   Procedures:  none  Microbiology  Urine cultures - E coli  Antimicrobials: Vanc / Cefepime    Objective: Vitals:   03/15/21 1602 03/15/21 2056 03/16/21 0551 03/16/21 0812  BP: (!) 146/95 (!)  152/74 (!) 148/79 (!) 141/75  Pulse: 72 85 79 71  Resp: 18 18 20 19   Temp: 99.6 F (37.6 C) 99.3 F (37.4 C) 99.3 F (37.4 C) 97.7 F (36.5 C)  TempSrc:  Oral Oral Oral  SpO2: 96% 93% 93% 98%  Weight:      Height:        Intake/Output Summary (Last 24 hours) at 03/16/2021 1204 Last data filed at 03/16/2021 0500 Gross per 24 hour  Intake 700 ml  Output 1950 ml  Net -1250 ml    Filed Weights   03/11/21 0556  Weight: 75.7 kg    Examination:  Constitutional: nad Eyes: anicteric ENMT: mmm Neck: normal, supple Respiratory: no wheezing, moves air well Cardiovascular: rrr, no mrg, no edema Abdomen: soft, nt, nd, bs+ Musculoskeletal: no clubbing / cyanosis.  Skin: no rashes Neurologic: non focal   Data Reviewed: I have independently reviewed following labs and imaging studies   CBC: Recent Labs  Lab 03/11/21 0600 03/12/21 0542 03/13/21 0612 03/14/21 0511 03/15/21 0601 03/16/21 0509  WBC 13.6* 14.0* 13.5* 10.8* 9.7 9.3  NEUTROABS 11.5*  --   --   --   --   --   HGB 10.5* 9.6* 10.6* 9.1* 9.8* 9.4*  HCT 33.3* 29.0* 33.6* 28.3* 31.2* 29.4*  MCV 95.4 95.7 97.4 95.9 94.0 95.1  PLT 238 198 213 228 262 366    Basic Metabolic Panel: Recent Labs  Lab 03/12/21 0542 03/13/21 0612 03/14/21 0511 03/15/21 0601 03/16/21 0509  NA 137 137 137 136 138  K 4.4 4.1 3.9 3.7 3.6  CL 105 104 105 104 106  CO2 24 24 24 25 24   GLUCOSE 130* 94 122* 98 100*  BUN 22 21 22 21 21   CREATININE 1.20* 1.06* 1.02* 0.94 0.96  CALCIUM 8.5* 9.2 8.8* 8.7* 8.5*    Liver Function Tests: Recent Labs  Lab 03/11/21 0600 03/14/21 0511  AST 19 11*  ALT 12 10  ALKPHOS 74 74  BILITOT 0.7 0.7  PROT 6.4* 5.9*  ALBUMIN 3.2* 2.7*    Coagulation Profile: Recent Labs  Lab 03/11/21 0600  INR 1.1    HbA1C: No results for input(s): HGBA1C in the last 72 hours. CBG: No results for input(s): GLUCAP in the last 168 hours.  Recent Results (from the past 240 hour(s))  Resp Panel by RT-PCR  (Flu A&B, Covid) Nasopharyngeal Swab     Status: None   Collection Time: 03/11/21  6:00 AM   Specimen: Nasopharyngeal Swab; Nasopharyngeal(NP) swabs in vial transport medium  Result Value Ref Range Status   SARS Coronavirus 2 by RT PCR NEGATIVE NEGATIVE Final    Comment: (NOTE) SARS-CoV-2 target nucleic acids are NOT DETECTED.  The SARS-CoV-2 RNA is generally detectable in upper respiratory specimens during the acute phase of infection. The lowest concentration of SARS-CoV-2  viral copies this assay can detect is 138 copies/mL. A negative result does not preclude SARS-Cov-2 infection and should not be used as the sole basis for treatment or other patient management decisions. A negative result may occur with  improper specimen collection/handling, submission of specimen other than nasopharyngeal swab, presence of viral mutation(s) within the areas targeted by this assay, and inadequate number of viral copies(<138 copies/mL). A negative result must be combined with clinical observations, patient history, and epidemiological information. The expected result is Negative.  Fact Sheet for Patients:  EntrepreneurPulse.com.au  Fact Sheet for Healthcare Providers:  IncredibleEmployment.be  This test is no t yet approved or cleared by the [place] FDA and  has been authorized for detection and/or diagnosis of SARS-CoV-2 by FDA under an Emergency Use Authorization (EUA). This EUA will remain  in effect (meaning this test can be used) for the duration of the COVID-19 declaration under Section 564(b)(1) of the Act, 21 U.S.C.section 360bbb-3(b)(1), unless the authorization is terminated  or revoked sooner.       Influenza A by PCR NEGATIVE NEGATIVE Final   Influenza B by PCR NEGATIVE NEGATIVE Final    Comment: (NOTE) The Xpert Xpress SARS-CoV-2/FLU/RSV plus assay is intended as an aid in the diagnosis of influenza from Nasopharyngeal swab specimens  and should not be used as a sole basis for treatment. Nasal washings and aspirates are unacceptable for Xpert Xpress SARS-CoV-2/FLU/RSV testing.  Fact Sheet for Patients: EntrepreneurPulse.com.au  Fact Sheet for Healthcare Providers: IncredibleEmployment.be  This test is not yet approved or cleared by the [place] FDA and has been authorized for detection and/or diagnosis of SARS-CoV-2 by FDA under an Emergency Use Authorization (EUA). This EUA will remain in effect (meaning this test can be used) for the duration of the COVID-19 declaration under Section 564(b)(1) of the Act, 21 U.S.C. section 360bbb-3(b)(1), unless the authorization is terminated or revoked.  Performed at [facility], [address] [place], [place] [address]   Urine culture     Status: Abnormal   Collection Time: 03/11/21  6:00 AM   Specimen: In/Out Cath Urine  Result Value Ref Range Status   Specimen Description   Final    IN/OUT CATH URINE Performed at [facility], [address] [place], [place] [address]    Special Requests   Final    NONE Performed at [facility], [place], [place] [address]    Culture >=100,000 COLONIES/mL ESCHERICHIA COLI (A)  Final   Report Status 03/13/2021 FINAL  Final   Organism ID, Bacteria ESCHERICHIA COLI (A)  Final      Susceptibility   Escherichia coli - MIC*    AMPICILLIN >=32 RESISTANT Resistant     CEFAZOLIN >=64 RESISTANT Resistant     CEFEPIME <=0.12 SENSITIVE Sensitive     CEFTRIAXONE 0.5 SENSITIVE Sensitive     CIPROFLOXACIN <=0.25 SENSITIVE Sensitive     GENTAMICIN <=1 SENSITIVE Sensitive     IMIPENEM <=0.25 SENSITIVE Sensitive     NITROFURANTOIN <=16 SENSITIVE Sensitive     TRIMETH/SULFA <=20 SENSITIVE Sensitive     AMPICILLIN/SULBACTAM >=32 RESISTANT Resistant     PIP/TAZO 8 SENSITIVE Sensitive     * >=100,000 COLONIES/mL ESCHERICHIA COLI  Blood Culture (routine x  2)     Status: None (Preliminary result)   Collection Time: 03/11/21  6:01 AM   Specimen: BLOOD  Result Value Ref Range Status   Specimen Description BLOOD LEFT ARM  Final   Special Requests  Final    BOTTLES DRAWN AEROBIC AND ANAEROBIC Blood Culture adequate volume   Culture   Final    NO GROWTH 4 DAYS Performed at [facility], [address] [place], [place] [address]    Report Status PENDING  Incomplete  Blood Culture (routine x 2)     Status: None (Preliminary result)   Collection Time: 03/11/21  6:02 AM   Specimen: BLOOD  Result Value Ref Range Status   Specimen Description BLOOD RIGHT ARM  Final   Special Requests   Final    BOTTLES DRAWN AEROBIC AND ANAEROBIC Blood Culture adequate volume   Culture   Final    NO GROWTH 4 DAYS Performed at [facility], [address] [place], [place] [address]    Report Status PENDING  Incomplete  MRSA PCR Screening     Status: Abnormal   Collection Time: 03/15/21 10:24 AM   Specimen: Nasopharyngeal  Result Value Ref Range Status   MRSA by PCR POSITIVE (A) NEGATIVE Final    Comment:        The GeneXpert MRSA Assay (FDA approved for NASAL specimens only), is one component of a comprehensive MRSA colonization surveillance program. It is not intended to diagnose MRSA infection nor to guide or monitor treatment for MRSA infections. RESULT CALLED TO, READ BACK BY AND VERIFIED WITH: A.Wille Celeste 1207 03/15/21 GM Performed at [facility], [address] [place], [place] [address]      Radiology Studies: No results found.   Marzetta Board, MD, PhD [facility]  Between 7 am - 7 pm I am available, please contact me via Amion (for emergencies) or Securechat (non urgent messages)  Between 7 pm - 7 am I am not available, please contact night coverage MD/APP via Amion

## 2021-03-17 LAB — CULTURE, BLOOD (ROUTINE X 2)
Culture: NO GROWTH
Culture: NO GROWTH
Special Requests: ADEQUATE
Special Requests: ADEQUATE

## 2021-03-17 MED ORDER — FUROSEMIDE 10 MG/ML IJ SOLN
40.0000 mg | Freq: Once | INTRAMUSCULAR | Status: AC
  Administered 2021-03-17: 40 mg via INTRAVENOUS
  Filled 2021-03-17: qty 4

## 2021-03-17 NOTE — TOC Initial Note (Addendum)
Transition of Care [facility]) - Initial/Assessment Note    Patient Details  Name: [name] MRN: [id number] Date of Birth: 02-12-1926  [facility]) CM/SW Contact:    Magnus Ivan, LCSW Phone Number: 03/17/2021, 3:18 PM  Clinical Narrative:                Spoke with weekend on call NP for [facility] - Deneise Lever [phone]). Provided update from chart. She reported [facility] does not provide transport over the weekend so if patient does discharge this weekend, husband would need to transport otherwise they would approve nonemergent EMS. Informed her of HHPT recommendation- she reported patient attends their center 5 days/week and will get PT there. CSW will continue to follow.   Expected Discharge Plan: Home/Self Care Barriers to Discharge: Continued Medical Work up   Patient Goals and CMS Choice Patient states their goals for this hospitalization and ongoing recovery are:: home with [facility] CMS Medicare.gov Compare Post Acute Care list provided to:: Patient Represenative (must comment) Choice offered to / list presented to : NA  Expected Discharge Plan and Services Expected Discharge Plan: Home/Self Care       Living arrangements for the past 2 months: Single Family Home                                      Prior Living Arrangements/Services Living arrangements for the past 2 months: Single Family Home Lives with:: Spouse                   Activities of Daily Living Home Assistive Devices/Equipment: Other (Comment) (UTA) ADL Screening (condition at time of admission) Patient's cognitive ability adequate to safely complete daily activities?: No Is the patient deaf or have difficulty hearing?: No Does the patient have difficulty seeing, even when wearing glasses/contacts?: No Does the patient have difficulty concentrating, remembering, or making decisions?: Yes Patient able to express need for assistance with ADLs?: No Does the patient have  difficulty dressing or bathing?: Yes Independently performs ADLs?: No Communication: Needs assistance Is this a change from baseline?: Change from baseline, expected to last <3 days Dressing (OT): Needs assistance Is this a change from baseline?: Change from baseline, expected to last <3days Grooming: Needs assistance Is this a change from baseline?: Pre-admission baseline Feeding: Needs assistance Is this a change from baseline?: Pre-admission baseline Bathing: Needs assistance Is this a change from baseline?: Pre-admission baseline Toileting: Needs assistance Is this a change from baseline?: Pre-admission baseline In/Out Bed: Needs assistance Is this a change from baseline?: Pre-admission baseline Walks in Home: Needs assistance Is this a change from baseline?: Change from baseline, expected to last >3 days Does the patient have difficulty walking or climbing stairs?: Yes Weakness of Legs: Both Weakness of Arms/Hands: Both  Permission Sought/Granted                  Emotional Assessment       Orientation: : Fluctuating Orientation (Suspected and/or reported Sundowners) Alcohol / Substance Use: Not Applicable Psych Involvement: No (comment)  Admission diagnosis:  CAP (community acquired pneumonia) [J18.9] Acute UTI [N39.0] Acute respiratory failure with hypoxia ([place]) [J96.01] HCAP (healthcare-associated pneumonia) [J18.9] Pneumonia of both lower lobes due to infectious organism [J18.9] Acute sepsis [facility]) [A41.9] Patient Active Problem List   Diagnosis Date Noted   Pressure injury of skin 03/14/2021   HCAP (healthcare-associated pneumonia) 03/11/2021   Sepsis ([place])  03/11/2021   UTI (urinary tract infection) 03/11/2021   Acute respiratory failure with hypoxia (HCC)    Acute UTI    Acute renal failure (ARF) (HCC) 42/70/6237   Acute metabolic encephalopathy 62/83/1517   Hyperkalemia 01/13/2021   AKI (acute kidney injury) ([place]) 01/13/2021   Rhabdomyolysis  01/13/2021   Macrocytic anemia 01/13/2021   Osteoarthritis of glenohumeral joint (Severe) (Bilateral) 08/26/2018   Osteoarthritis of AC (acromioclavicular) joint (Severe) (Bilateral) 08/26/2018   Chronic acromioclavicular (AC) joint pain (Bilateral) 08/26/2018   Spondylolisthesis, cervical region (Multilevel) 08/13/2018   Spondylolisthesis, cervicothoracic region (Multilevel) 08/13/2018   Cervical foraminal stenosis (Severe C4-5 through C6-7) 08/13/2018   Cervical radiculitis (Bilateral) 08/13/2018   Cervical  Grade 1 Anterolisthesis of C4/5, C5/6, & C7/T1 08/13/2018   Cervical facet arthropathy (Severe) (Right: C4-5, C5-6) 08/13/2018   Cervical facet syndrome (Bilateral) (R>L) 08/13/2018   Cervical radiculopathy due to osteoarthritis of spine 08/13/2018   Hx of total knee replacement (Left) 07/27/2018   DDD (degenerative disc disease), cervical 07/27/2018   Chronic shoulder pain (Primary Area of Pain) (Bilateral) (R>L) 07/14/2018   Chronic knee pain (Secondary Area of Pain) (Right) 07/14/2018   Chronic hip pain (Tertiary Area of Pain) (Bilateral) (R>L) 07/14/2018   Chronic pain syndrome 07/14/2018   Pharmacologic therapy 07/14/2018   Disorder of skeletal system 07/14/2018   Problems influencing health status 07/14/2018   Hyperlipidemia 03/02/2016   Dementia ([place]) 03/02/2016   Vertigo 03/01/2016   Frequent falls 12/04/2014   Hyperglycemia 12/04/2014   Health care maintenance 12/04/2014   Encounter for general adult medical examination without abnormal findings 12/04/2014   URI (upper respiratory infection) 10/24/2014   OAB (overactive bladder) 06/17/2014   Pain of left thumb 05/31/2014   Pain of finger of left hand 05/31/2014   Rectocele 03/17/2014   Vaginal atrophy 03/17/2014   Cough 03/14/2014   Bladder prolapse, female, acquired 02/15/2014   Mixed stress and urge urinary incontinence 02/15/2014   Urethral prolapse 02/15/2014   Dysuria 02/15/2014   Elevated alkaline  phosphatase level 01/30/2014   Abnormal levels of other serum enzymes 01/30/2014   Frequency of micturition 12/24/2013   Cervicalgia 12/12/2013   Skin lesion of breast 12/12/2013   Disorder of breast 12/12/2013   Loss of vision 10/11/2013   Vision loss, left eye 10/11/2013   Chest pain 10/11/2013   DOE (dyspnea on exertion) 10/11/2013   Anxiety disorder 10/11/2013   Other forms of dyspnea 10/11/2013   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 10/11/2013   Visual loss 10/11/2013   Cervicogenic headache 02/28/2013   History of frequent urinary tract infections 02/01/2013   Hypercholesterolemia 02/01/2013   Pulmonary hypertension ([place]) 02/01/2013   Major depressive disorder, single episode 02/01/2013   Sleep disorder 02/01/2013   Personal history of urinary infection 02/01/2013   Pure hypercholesterolemia 02/01/2013   Other secondary pulmonary hypertension ([place]) 02/01/2013   GERD (gastroesophageal reflux disease) 12/14/2012   Chronic headaches 12/14/2012   Osteoarthritis of shoulders (Bilateral) 12/14/2012   Depression with anxiety 12/14/2012   Dizziness 09/10/2012   Essential hypertension, benign 09/10/2012   Essential (primary) hypertension 09/10/2012   PCP:  [facility] [place]:   [place], [place] - [place] [address] [place] [address] Phone: [phone] Fax: [place], [place] [address] [place] [address] Phone: [phone] Fax: [place], [place] -  [address] [place] [address] Phone: [phone] Fax: [phone]     Social Determinants of Health (SDOH) Interventions    Readmission Risk Interventions No flowsheet data found.

## 2021-03-17 NOTE — Progress Notes (Signed)
PROGRESS NOTE  [name] PYY:[id number] DOB: [birth year] DOA: 03/11/2021 PCP: [facility], [place]   LOS: 6 days   Brief Narrative / Interim history: 87 year old female with HTN, HLD, depression/anxiety, dementia, uterine cancer status post hysterectomy, OSA not on CPAP, RBBB, comes into the hospital with cough, shortness of breath, fever and chills.  Chest x-ray was concerning for pneumonia, she was placed on antibiotics and admitted to the hospital.  Hospital course complicated by somnolence/metabolic encephalopathy  Subjective / 24h Interval events: Alert, confused.  Wakes up easily  Assessment & Plan: Principal Problem Sepsis due to pneumonia, acute hypoxic respiratory failure-patient was started on antibiotics with vancomycin and cefepime, seems to be overall improving.  Cultures are negative on day 6, narrowed to doxycycline for 2 more days -remains on 4L, clinically improving. Mobilize more.  Give Lasix x1 today  Active Problems Bacteriuria-given underlying dementia not sure whether she had symptoms of this is colonization.  Completed treatment as per #1  Acute metabolic encephalopathy, somnolence on 6/7-CT head was unremarkable.  She underwent an MRI which is negative for stroke.  Neurology saw the patient believes this is related to her infectious process.  She is also on multiple sedating medications at home, and now she is off her melatonin, trazodone, Remeron.  Remains alert  Acute kidney injury-creatinine overall stable  Dementia-noted  Essential hypertension-hold lisinopril due to AKI  Scheduled Meds:  brimonidine  1 drop Both Eyes BID   calcium-vitamin D  1 tablet Oral Daily   Chlorhexidine Gluconate Cloth  6 each Topical Q0600   cholecalciferol  1,000 Units Oral Daily   doxycycline  100 mg Oral Q12H   enoxaparin (LOVENOX) injection  40 mg Subcutaneous Q24H   feeding supplement  237 mL Oral BID BM   gabapentin  100 mg Oral QHS    guaiFENesin  1,200 mg Oral BID   mometasone-formoterol  2 puff Inhalation BID   multivitamin with minerals  1 tablet Oral Daily   mupirocin ointment  1 application Nasal BID   oxyCODONE  5 mg Oral BID   pantoprazole  40 mg Oral BID AC   polyethylene glycol  17 g Oral Daily   predniSONE  20 mg Oral Q breakfast   senna  2 tablet Oral QHS   sertraline  150 mg Oral Daily   Travoprost (BAK Free)  1 drop Both Eyes QHS   vitamin B-12  1,000 mcg Oral Daily   Continuous Infusions:   PRN Meds:.acetaminophen, acetaminophen, albuterol, benzonatate, hydrALAZINE, mirtazapine  Diet Orders (From admission, onward)     Start     Ordered   03/14/21 1103  DIET DYS 3 Room service appropriate? Yes with Assist; Fluid consistency: Thin  Diet effective now       Comments: Extra Gravy on chopped/cut meats, potatoes too. Chop spaghetti. Family is in room to help choose but have to ask in person. HOH.  Question Answer Comment  Room service appropriate? Yes with Assist   Fluid consistency: Thin      03/14/21 1106            DVT prophylaxis: enoxaparin (LOVENOX) injection 40 mg Start: 03/11/21 2200     Code Status: DNR  Family Communication: Daughter present at bedside  Status is: Inpatient  Remains inpatient appropriate because:Altered mental status and Inpatient level of care appropriate due to severity of illness   Dispo: The patient is from: Home  Anticipated d/c is to: Home              Patient currently is not medically stable to d/c.   Difficult to place patient No   Level of care: Med-Surg  Consultants:  Neurology   Procedures:  none  Microbiology  Urine cultures - E coli  Antimicrobials: Vanc / Cefepime    Objective: Vitals:   03/16/21 2049 03/17/21 0000 03/17/21 0442 03/17/21 0828  BP: (!) 154/69 139/69 (!) 156/81 (!) 172/110  Pulse: 70 64 74 81  Resp: 20 18 20  (!) 22  Temp: 98.1 F (36.7 C) 98 F (36.7 C) 98.8 F (37.1 C) 97.9 F (36.6 C)   TempSrc:  Oral  Oral  SpO2: 98% 97% 96% 97%  Weight:      Height:        Intake/Output Summary (Last 24 hours) at 03/17/2021 1300 Last data filed at 03/17/2021 1206 Gross per 24 hour  Intake 100 ml  Output 800 ml  Net -700 ml    Filed Weights   03/11/21 0556  Weight: 75.7 kg    Examination:  Constitutional: NAD Eyes: No icterus ENMT: Moist mucous membranes Neck: normal, supple Respiratory: No wheezing, moves air well Cardiovascular: Regular rate and rhythm, no murmurs, no peripheral edema Abdomen: Soft, NT, ND, bowel sounds positive Musculoskeletal: no clubbing / cyanosis.  Skin: No rashes Neurologic: n nonfocal  Data Reviewed: I have independently reviewed following labs and imaging studies   CBC: Recent Labs  Lab 03/11/21 0600 03/12/21 0542 03/13/21 0612 03/14/21 0511 03/15/21 0601 03/16/21 0509  WBC 13.6* 14.0* 13.5* 10.8* 9.7 9.3  NEUTROABS 11.5*  --   --   --   --   --   HGB 10.5* 9.6* 10.6* 9.1* 9.8* 9.4*  HCT 33.3* 29.0* 33.6* 28.3* 31.2* 29.4*  MCV 95.4 95.7 97.4 95.9 94.0 95.1  PLT 238 198 213 228 262 096    Basic Metabolic Panel: Recent Labs  Lab 03/12/21 0542 03/13/21 0612 03/14/21 0511 03/15/21 0601 03/16/21 0509  NA 137 137 137 136 138  K 4.4 4.1 3.9 3.7 3.6  CL 105 104 105 104 106  CO2 24 24 24 25 24   GLUCOSE 130* 94 122* 98 100*  BUN 22 21 22 21 21   CREATININE 1.20* 1.06* 1.02* 0.94 0.96  CALCIUM 8.5* 9.2 8.8* 8.7* 8.5*    Liver Function Tests: Recent Labs  Lab 03/11/21 0600 03/14/21 0511  AST 19 11*  ALT 12 10  ALKPHOS 74 74  BILITOT 0.7 0.7  PROT 6.4* 5.9*  ALBUMIN 3.2* 2.7*    Coagulation Profile: Recent Labs  Lab 03/11/21 0600  INR 1.1    HbA1C: No results for input(s): HGBA1C in the last 72 hours. CBG: No results for input(s): GLUCAP in the last 168 hours.  Recent Results (from the past 240 hour(s))  Resp Panel by RT-PCR (Flu A&B, Covid) Nasopharyngeal Swab     Status: None   Collection Time: 03/11/21   6:00 AM   Specimen: Nasopharyngeal Swab; Nasopharyngeal(NP) swabs in vial transport medium  Result Value Ref Range Status   SARS Coronavirus 2 by RT PCR NEGATIVE NEGATIVE Final    Comment: (NOTE) SARS-CoV-2 target nucleic acids are NOT DETECTED.  The SARS-CoV-2 RNA is generally detectable in upper respiratory specimens during the acute phase of infection. The lowest concentration of SARS-CoV-2 viral copies this assay can detect is 138 copies/mL. A negative result does not preclude SARS-Cov-2 infection and should not be used as the  sole basis for treatment or other patient management decisions. A negative result may occur with  improper specimen collection/handling, submission of specimen other than nasopharyngeal swab, presence of viral mutation(s) within the areas targeted by this assay, and inadequate number of viral copies(<138 copies/mL). A negative result must be combined with clinical observations, patient history, and epidemiological information. The expected result is Negative.  Fact Sheet for Patients:  EntrepreneurPulse.com.au  Fact Sheet for Healthcare Providers:  IncredibleEmployment.be  This test is no t yet approved or cleared by the [place] FDA and  has been authorized for detection and/or diagnosis of SARS-CoV-2 by FDA under an Emergency Use Authorization (EUA). This EUA will remain  in effect (meaning this test can be used) for the duration of the COVID-19 declaration under Section 564(b)(1) of the Act, 21 U.S.C.section 360bbb-3(b)(1), unless the authorization is terminated  or revoked sooner.       Influenza A by PCR NEGATIVE NEGATIVE Final   Influenza B by PCR NEGATIVE NEGATIVE Final    Comment: (NOTE) The Xpert Xpress SARS-CoV-2/FLU/RSV plus assay is intended as an aid in the diagnosis of influenza from Nasopharyngeal swab specimens and should not be used as a sole basis for treatment. Nasal washings and aspirates  are unacceptable for Xpert Xpress SARS-CoV-2/FLU/RSV testing.  Fact Sheet for Patients: EntrepreneurPulse.com.au  Fact Sheet for Healthcare Providers: IncredibleEmployment.be  This test is not yet approved or cleared by the [place] FDA and has been authorized for detection and/or diagnosis of SARS-CoV-2 by FDA under an Emergency Use Authorization (EUA). This EUA will remain in effect (meaning this test can be used) for the duration of the COVID-19 declaration under Section 564(b)(1) of the Act, 21 U.S.C. section 360bbb-3(b)(1), unless the authorization is terminated or revoked.  Performed at [facility], [address] [place], [place] [address]   Urine culture     Status: Abnormal   Collection Time: 03/11/21  6:00 AM   Specimen: In/Out Cath Urine  Result Value Ref Range Status   Specimen Description   Final    IN/OUT CATH URINE Performed at [facility], [address] [place], [place] [address]    Special Requests   Final    NONE Performed at [facility], [place],  [address]    Culture >=100,000 COLONIES/mL ESCHERICHIA COLI (A)  Final   Report Status 03/13/2021 FINAL  Final   Organism ID, Bacteria ESCHERICHIA COLI (A)  Final      Susceptibility   Escherichia coli - MIC*    AMPICILLIN >=32 RESISTANT Resistant     CEFAZOLIN >=64 RESISTANT Resistant     CEFEPIME <=0.12 SENSITIVE Sensitive     CEFTRIAXONE 0.5 SENSITIVE Sensitive     CIPROFLOXACIN <=0.25 SENSITIVE Sensitive     GENTAMICIN <=1 SENSITIVE Sensitive     IMIPENEM <=0.25 SENSITIVE Sensitive     NITROFURANTOIN <=16 SENSITIVE Sensitive     TRIMETH/SULFA <=20 SENSITIVE Sensitive     AMPICILLIN/SULBACTAM >=32 RESISTANT Resistant     PIP/TAZO 8 SENSITIVE Sensitive     * >=100,000 COLONIES/mL ESCHERICHIA COLI  Blood Culture (routine x 2)     Status: None (Preliminary result)   Collection Time: 03/11/21  6:01 AM    Specimen: BLOOD  Result Value Ref Range Status   Specimen Description BLOOD LEFT ARM  Final   Special Requests   Final    BOTTLES DRAWN AEROBIC AND ANAEROBIC Blood Culture adequate volume   Culture   Final  NO GROWTH 4 DAYS Performed at [facility], [address] [place], [place] [address]    Report Status PENDING  Incomplete  Blood Culture (routine x 2)     Status: None (Preliminary result)   Collection Time: 03/11/21  6:02 AM   Specimen: BLOOD  Result Value Ref Range Status   Specimen Description BLOOD RIGHT ARM  Final   Special Requests   Final    BOTTLES DRAWN AEROBIC AND ANAEROBIC Blood Culture adequate volume   Culture   Final    NO GROWTH 4 DAYS Performed at [facility], [address] [place], [place] [address]    Report Status PENDING  Incomplete  MRSA PCR Screening     Status: Abnormal   Collection Time: 03/15/21 10:24 AM   Specimen: Nasopharyngeal  Result Value Ref Range Status   MRSA by PCR POSITIVE (A) NEGATIVE Final    Comment:        The GeneXpert MRSA Assay (FDA approved for NASAL specimens only), is one component of a comprehensive MRSA colonization surveillance program. It is not intended to diagnose MRSA infection nor to guide or monitor treatment for MRSA infections. RESULT CALLED TO, READ BACK BY AND VERIFIED WITH: A.Wille Celeste 1207 03/15/21 GM Performed at [facility], [address], [place], [place] [address]      Radiology Studies: No results found.   Marzetta Board, MD, PhD [facility]  Between 7 am - 7 pm I am available, please contact me via Amion (for emergencies) or Securechat (non urgent messages)  Between 7 pm - 7 am I am not available, please contact night coverage MD/APP via Amion

## 2021-03-17 NOTE — Plan of Care (Signed)

## 2021-03-18 LAB — BASIC METABOLIC PANEL
Anion gap: 7 (ref 5–15)
BUN: 21 mg/dL (ref 8–23)
CO2: 29 mmol/L (ref 22–32)
Calcium: 8.8 mg/dL — ABNORMAL LOW (ref 8.9–10.3)
Chloride: 104 mmol/L (ref 98–111)
Creatinine, Ser: 1.08 mg/dL — ABNORMAL HIGH (ref 0.44–1.00)
GFR, Estimated: 48 mL/min — ABNORMAL LOW (ref >60)
Glucose, Bld: 115 mg/dL — ABNORMAL HIGH (ref 70–99)
Potassium: 3.5 mmol/L (ref 3.5–5.1)
Sodium: 140 mmol/L (ref 135–145)

## 2021-03-18 MED ORDER — MILK AND MOLASSES ENEMA
1.0000 | Freq: Once | RECTAL | Status: AC
  Administered 2021-03-19: 240 mL via RECTAL
  Filled 2021-03-18: qty 240

## 2021-03-18 MED ORDER — POTASSIUM CHLORIDE CRYS ER 20 MEQ PO TBCR
40.0000 meq | EXTENDED_RELEASE_TABLET | Freq: Once | ORAL | Status: AC
  Administered 2021-03-18: 40 meq via ORAL
  Filled 2021-03-18: qty 2

## 2021-03-18 MED ORDER — MILK AND MOLASSES ENEMA: 1.0000 | Freq: Once | RECTAL | Status: DC

## 2021-03-18 MED ORDER — GLYCERIN (LAXATIVE) 2 G RE SUPP
1.0000 | Freq: Every day | RECTAL | Status: DC | PRN
  Filled 2021-03-18 (×2): qty 1

## 2021-03-18 MED ORDER — FUROSEMIDE 10 MG/ML IJ SOLN: 40.0000 mg | Freq: Once | INTRAMUSCULAR | Status: DC

## 2021-03-18 MED ORDER — POTASSIUM CHLORIDE CRYS ER 20 MEQ PO TBCR: 30.0000 meq | EXTENDED_RELEASE_TABLET | Freq: Once | ORAL | Status: DC

## 2021-03-18 MED ORDER — FUROSEMIDE 10 MG/ML IJ SOLN
20.0000 mg | Freq: Once | INTRAMUSCULAR | Status: AC
  Administered 2021-03-18: 20 mg via INTRAVENOUS
  Filled 2021-03-18: qty 4

## 2021-03-18 NOTE — Plan of Care (Signed)

## 2021-03-18 NOTE — Progress Notes (Signed)
Patient very confused, screaming and yelling for help during beginning of shift. Reoriented and provided reassurance. Remeron PRN was given and patient rested well for rest of shift.

## 2021-03-18 NOTE — Progress Notes (Signed)
Order received from Dr. Cruzita Lederer to place enema order for the patient if she need it.

## 2021-03-18 NOTE — Progress Notes (Signed)
PROGRESS NOTE  [name] JIR:[id number] DOB: 1925/10/12 DOA: 03/11/2021 PCP: [facility], [place]   LOS: 7 days   Brief Narrative / Interim history: 87 year old female with HTN, HLD, depression/anxiety, dementia, uterine cancer status post hysterectomy, OSA not on CPAP, RBBB, comes into the hospital with cough, shortness of breath, fever and chills.  Chest x-ray was concerning for pneumonia, she was placed on antibiotics and admitted to the hospital.  Hospital course complicated by somnolence/metabolic encephalopathy  Subjective / 24h Interval events: Sleeping when I entered the room but wakes up easily, alert.  She has no complaints.  Assessment & Plan: Principal Problem Sepsis due to pneumonia, acute hypoxic respiratory failure-patient was started on antibiotics with vancomycin and cefepime, seems to be overall improving.  She was narrowed to doxycycline last day is tomorrow -Received Lasix yesterday with improvement in her respiratory status, on 2 L this morning, give 1 more Lasix today  Active Problems Bacteriuria-given underlying dementia not sure whether she had symptoms of this is colonization.  Completed treatment as per #1  Acute metabolic encephalopathy, somnolence on 6/7-CT head was unremarkable.  She underwent an MRI which is negative for stroke.  Neurology saw the patient believes this is related to her infectious process.  She is also on multiple sedating medications at home, and now she is off her melatonin, trazodone, Remeron.  Remains alert  Acute kidney injury-creatinine stable, monitor while on Lasix  Dementia-noted  Essential hypertension-hold lisinopril due to AKI  Scheduled Meds:  brimonidine  1 drop Both Eyes BID   calcium-vitamin D  1 tablet Oral Daily   Chlorhexidine Gluconate Cloth  6 each Topical Q0600   cholecalciferol  1,000 Units Oral Daily   doxycycline  100 mg Oral Q12H   enoxaparin (LOVENOX) injection  40 mg  Subcutaneous Q24H   feeding supplement  237 mL Oral BID BM   furosemide  40 mg Intravenous Once   gabapentin  100 mg Oral QHS   guaiFENesin  1,200 mg Oral BID   mometasone-formoterol  2 puff Inhalation BID   multivitamin with minerals  1 tablet Oral Daily   mupirocin ointment  1 application Nasal BID   oxyCODONE  5 mg Oral BID   pantoprazole  40 mg Oral BID AC   polyethylene glycol  17 g Oral Daily   potassium chloride  30 mEq Oral Once   predniSONE  20 mg Oral Q breakfast   senna  2 tablet Oral QHS   sertraline  150 mg Oral Daily   Travoprost (BAK Free)  1 drop Both Eyes QHS   vitamin B-12  1,000 mcg Oral Daily   Continuous Infusions:   PRN Meds:.acetaminophen, acetaminophen, albuterol, benzonatate, Glycerin (Adult), hydrALAZINE, mirtazapine  Diet Orders (From admission, onward)     Start     Ordered   03/14/21 1103  DIET DYS 3 Room service appropriate? Yes with Assist; Fluid consistency: Thin  Diet effective now       Comments: Extra Gravy on chopped/cut meats, potatoes too. Chop spaghetti. Family is in room to help choose but have to ask in person. HOH.  Question Answer Comment  Room service appropriate? Yes with Assist   Fluid consistency: Thin      03/14/21 1106            DVT prophylaxis: enoxaparin (LOVENOX) injection 40 mg Start: 03/11/21 2200     Code Status: DNR  Family Communication: Daughter present at bedside  Status is: Inpatient  Remains inpatient appropriate because:Altered mental status and Inpatient level of care appropriate due to severity of illness   Dispo: The patient is from: Home              Anticipated d/c is to: Home              Patient currently is not medically stable to d/c.   Difficult to place patient No   Level of care: Med-Surg  Consultants:  Neurology   Procedures:  none  Microbiology  Urine cultures - E coli  Antimicrobials: Vanc / Cefepime    Objective: Vitals:   03/17/21 1607 03/17/21 2014 03/18/21 0608  03/18/21 0858  BP: (!) 142/81 (!) 143/66 (!) 160/70 (!) 156/64  Pulse: 86 66 72 76  Resp: 18 16 18 18   Temp: 98.2 F (36.8 C) 98.5 F (36.9 C) 98 F (36.7 C) 97.9 F (36.6 C)  TempSrc: Oral Oral Oral Oral  SpO2: 97% 97% 96% 96%  Weight:      Height:        Intake/Output Summary (Last 24 hours) at 03/18/2021 1012 Last data filed at 03/18/2021 0263 Gross per 24 hour  Intake 240 ml  Output 2001 ml  Net -1761 ml    Filed Weights   03/11/21 0556  Weight: 75.7 kg    Examination:  Constitutional: No distress Eyes: no icterus ENMT: mmm Neck: normal, supple Respiratory: No wheezing, moves air well Cardiovascular: Regular rate and rhythm, no murmurs, no edema Abdomen: Soft, NT, ND, bowel sounds positive Musculoskeletal: no clubbing / cyanosis.  Skin: No rashes seen Neurologic: No focal deficits  Data Reviewed: I have independently reviewed following labs and imaging studies   CBC: Recent Labs  Lab 03/12/21 0542 03/13/21 0612 03/14/21 0511 03/15/21 0601 03/16/21 0509  WBC 14.0* 13.5* 10.8* 9.7 9.3  HGB 9.6* 10.6* 9.1* 9.8* 9.4*  HCT 29.0* 33.6* 28.3* 31.2* 29.4*  MCV 95.7 97.4 95.9 94.0 95.1  PLT 198 213 228 262 785    Basic Metabolic Panel: Recent Labs  Lab 03/12/21 0542 03/13/21 0612 03/14/21 0511 03/15/21 0601 03/16/21 0509  NA 137 137 137 136 138  K 4.4 4.1 3.9 3.7 3.6  CL 105 104 105 104 106  CO2 24 24 24 25 24   GLUCOSE 130* 94 122* 98 100*  BUN 22 21 22 21 21   CREATININE 1.20* 1.06* 1.02* 0.94 0.96  CALCIUM 8.5* 9.2 8.8* 8.7* 8.5*    Liver Function Tests: Recent Labs  Lab 03/14/21 0511  AST 11*  ALT 10  ALKPHOS 74  BILITOT 0.7  PROT 5.9*  ALBUMIN 2.7*    Coagulation Profile: No results for input(s): INR, PROTIME in the last 168 hours.  HbA1C: No results for input(s): HGBA1C in the last 72 hours. CBG: No results for input(s): GLUCAP in the last 168 hours.  Recent Results (from the past 240 hour(s))  Resp Panel by RT-PCR (Flu  A&B, Covid) Nasopharyngeal Swab     Status: None   Collection Time: 03/11/21  6:00 AM   Specimen: Nasopharyngeal Swab; Nasopharyngeal(NP) swabs in vial transport medium  Result Value Ref Range Status   SARS Coronavirus 2 by RT PCR NEGATIVE NEGATIVE Final    Comment: (NOTE) SARS-CoV-2 target nucleic acids are NOT DETECTED.  The SARS-CoV-2 RNA is generally detectable in upper respiratory specimens during the acute phase of infection. The lowest concentration of SARS-CoV-2 viral copies this assay can detect is 138 copies/mL. A negative result does not preclude SARS-Cov-2 infection and should  not be used as the sole basis for treatment or other patient management decisions. A negative result may occur with  improper specimen collection/handling, submission of specimen other than nasopharyngeal swab, presence of viral mutation(s) within the areas targeted by this assay, and inadequate number of viral copies(<138 copies/mL). A negative result must be combined with clinical observations, patient history, and epidemiological information. The expected result is Negative.  Fact Sheet for Patients:  EntrepreneurPulse.com.au  Fact Sheet for Healthcare Providers:  IncredibleEmployment.be  This test is no t yet approved or cleared by the [place] FDA and  has been authorized for detection and/or diagnosis of SARS-CoV-2 by FDA under an Emergency Use Authorization (EUA). This EUA will remain  in effect (meaning this test can be used) for the duration of the COVID-19 declaration under Section 564(b)(1) of the Act, 21 U.S.C.section 360bbb-3(b)(1), unless the authorization is terminated  or revoked sooner.       Influenza A by PCR NEGATIVE NEGATIVE Final   Influenza B by PCR NEGATIVE NEGATIVE Final    Comment: (NOTE) The Xpert Xpress SARS-CoV-2/FLU/RSV plus assay is intended as an aid in the diagnosis of influenza from Nasopharyngeal swab specimens  and should not be used as a sole basis for treatment. Nasal washings and aspirates are unacceptable for Xpert Xpress SARS-CoV-2/FLU/RSV testing.  Fact Sheet for Patients: EntrepreneurPulse.com.au  Fact Sheet for Healthcare Providers: IncredibleEmployment.be  This test is not yet approved or cleared by the [place] FDA and has been authorized for detection and/or diagnosis of SARS-CoV-2 by FDA under an Emergency Use Authorization (EUA). This EUA will remain in effect (meaning this test can be used) for the duration of the COVID-19 declaration under Section 564(b)(1) of the Act, 21 U.S.C. section 360bbb-3(b)(1), unless the authorization is terminated or revoked.  Performed at [facility], [address] [place], [place] [address]   Urine culture     Status: Abnormal   Collection Time: 03/11/21  6:00 AM   Specimen: In/Out Cath Urine  Result Value Ref Range Status   Specimen Description   Final    IN/OUT CATH URINE Performed at [facility], [address] [place], [place] [address]    Special Requests   Final    NONE Performed at [facility], [place], [place] [address]    Culture >=100,000 COLONIES/mL ESCHERICHIA COLI (A)  Final   Report Status 03/13/2021 FINAL  Final   Organism ID, Bacteria ESCHERICHIA COLI (A)  Final      Susceptibility   Escherichia coli - MIC*    AMPICILLIN >=32 RESISTANT Resistant     CEFAZOLIN >=64 RESISTANT Resistant     CEFEPIME <=0.12 SENSITIVE Sensitive     CEFTRIAXONE 0.5 SENSITIVE Sensitive     CIPROFLOXACIN <=0.25 SENSITIVE Sensitive     GENTAMICIN <=1 SENSITIVE Sensitive     IMIPENEM <=0.25 SENSITIVE Sensitive     NITROFURANTOIN <=16 SENSITIVE Sensitive     TRIMETH/SULFA <=20 SENSITIVE Sensitive     AMPICILLIN/SULBACTAM >=32 RESISTANT Resistant     PIP/TAZO 8 SENSITIVE Sensitive     * >=100,000 COLONIES/mL ESCHERICHIA COLI  Blood Culture (routine x  2)     Status: None   Collection Time: 03/11/21  6:01 AM   Specimen: BLOOD  Result Value Ref Range Status   Specimen Description BLOOD LEFT ARM  Final   Special Requests   Final    BOTTLES DRAWN AEROBIC AND ANAEROBIC Blood Culture adequate volume   Culture   Final  NO GROWTH 6 DAYS Performed at [facility], [address] [place], [place] [address]    Report Status 03/17/2021 FINAL  Final  Blood Culture (routine x 2)     Status: None   Collection Time: 03/11/21  6:02 AM   Specimen: BLOOD  Result Value Ref Range Status   Specimen Description BLOOD RIGHT ARM  Final   Special Requests   Final    BOTTLES DRAWN AEROBIC AND ANAEROBIC Blood Culture adequate volume   Culture   Final    NO GROWTH 6 DAYS Performed at [facility], [address] [place], [place] [address]    Report Status 03/17/2021 FINAL  Final  MRSA PCR Screening     Status: Abnormal   Collection Time: 03/15/21 10:24 AM   Specimen: Nasopharyngeal  Result Value Ref Range Status   MRSA by PCR POSITIVE (A) NEGATIVE Final    Comment:        The GeneXpert MRSA Assay (FDA approved for NASAL specimens only), is one component of a comprehensive MRSA colonization surveillance program. It is not intended to diagnose MRSA infection nor to guide or monitor treatment for MRSA infections. RESULT CALLED TO, READ BACK BY AND VERIFIED WITH: A.Wille Celeste 1207 03/15/21 GM Performed at [facility], [address], [place], [place] [address]      Radiology Studies: No results found.   Marzetta Board, MD, PhD [facility]  Between 7 am - 7 pm I am available, please contact me via Amion (for emergencies) or Securechat (non urgent messages)  Between 7 pm - 7 am I am not available, please contact night coverage MD/APP via Amion

## 2021-03-19 ENCOUNTER — Inpatient Hospital Stay: Payer: Medicare (Managed Care)

## 2021-03-19 LAB — RESP PANEL BY RT-PCR (FLU A&B, COVID) ARPGX2
Influenza A by PCR: NEGATIVE
Influenza B by PCR: NEGATIVE
SARS Coronavirus 2 by RT PCR: NEGATIVE

## 2021-03-19 MED ORDER — GUAIFENESIN-DM 100-10 MG/5ML PO SYRP
5.0000 mL | ORAL_SOLUTION | ORAL | Status: DC | PRN
  Administered 2021-03-19 – 2021-03-20 (×2): 5 mL via ORAL
  Filled 2021-03-19 (×2): qty 5

## 2021-03-19 NOTE — Progress Notes (Signed)
PROGRESS NOTE  [name] KXF:[id number] DOB: [birth year] DOA: 03/11/2021 PCP: [facility] [place]   LOS: 8 days   Brief Narrative / Interim history: 87 year old female with HTN, HLD, depression/anxiety, dementia, uterine cancer status post hysterectomy, OSA not on CPAP, RBBB, comes into the hospital with cough, shortness of breath, fever and chills.  Chest x-ray was concerning for pneumonia, she was placed on antibiotics and admitted to the hospital.  Hospital course complicated by somnolence/metabolic encephalopathy  Subjective / 24h Interval events: No complaints  Assessment & Plan: Principal Problem Sepsis due to pneumonia, acute hypoxic respiratory failure-patient was placed on broad spectrum antibiotics initially with subsequent improvement in her respiratory status. She finished 8 days of antibiotics while hospitalized, her WBC has normalized and a repeat CXR shows improvement. Given lack of ambulation and prolonged immobility and poor inspiratory effort she is at risk for recurrent pneumonias. I suspect she has a degree of silent aspiration of her own saliva given weakness / dementia as well.  Discussed with [facility] MD today again, they recommend patient go to one of the SNF's which are contracted with them, placement pending.  Family concerned that they are not able to take care of the patient at home  Active Problems Bacteriuria-given underlying dementia not sure whether she had symptoms of this is colonization.  Completed treatment as per #1 Acute metabolic encephalopathy, somnolence on 6/7-CT head was unremarkable.  She underwent an MRI which was negative for stroke.  Neurology saw the patient believes this is related to her infectious process.  She is also on multiple sedating medications at home, and now she is off her melatonin, trazodone, Remeron.  Mental status improved now Acute kidney injury-creatinine stable and at baseline Dementia-noted Essential  hypertension - continue home medications  Scheduled Meds:  brimonidine  1 drop Both Eyes BID   calcium-vitamin D  1 tablet Oral Daily   Chlorhexidine Gluconate Cloth  6 each Topical Q0600   cholecalciferol  1,000 Units Oral Daily   doxycycline  100 mg Oral Q12H   enoxaparin (LOVENOX) injection  40 mg Subcutaneous Q24H   feeding supplement  237 mL Oral BID BM   gabapentin  100 mg Oral QHS   guaiFENesin  1,200 mg Oral BID   mometasone-formoterol  2 puff Inhalation BID   multivitamin with minerals  1 tablet Oral Daily   mupirocin ointment  1 application Nasal BID   oxyCODONE  5 mg Oral BID   pantoprazole  40 mg Oral BID AC   polyethylene glycol  17 g Oral Daily   predniSONE  20 mg Oral Q breakfast   senna  2 tablet Oral QHS   sertraline  150 mg Oral Daily   Travoprost (BAK Free)  1 drop Both Eyes QHS   vitamin B-12  1,000 mcg Oral Daily   Continuous Infusions:  PRN Meds:.acetaminophen, acetaminophen, albuterol, benzonatate, Glycerin (Adult), hydrALAZINE, mirtazapine  Diet Orders (From admission, onward)     Start     Ordered   03/14/21 1103  DIET DYS 3 Room service appropriate? Yes with Assist; Fluid consistency: Thin  Diet effective now       Comments: Extra Gravy on chopped/cut meats, potatoes too. Chop spaghetti. Family is in room to help choose but have to ask in person. HOH.  Question Answer Comment  Room service appropriate? Yes with Assist   Fluid consistency: Thin      03/14/21 1106  DVT prophylaxis: enoxaparin (LOVENOX) injection 40 mg Start: 03/11/21 2200    Code Status: DNR  Family Communication: Daughter present at bedside  Status is: Inpatient  Remains inpatient appropriate because:Altered mental status and Inpatient level of care appropriate due to severity of illness   Dispo: The patient is from: Home              Anticipated d/c is to: Home              Patient currently is not medically stable to d/c.   Difficult to place patient  No  Level of care: Med-Surg  Consultants:  Neurology   Procedures:  none  Microbiology  Urine cultures - E coli  Antimicrobials: Vanc / Cefepime    Objective: Vitals:   03/18/21 1558 03/19/21 0000 03/19/21 0740 03/19/21 1124  BP: 131/72 (!) 127/56 (!) 168/65 (!) 148/62  Pulse: 86 71 70 72  Resp: 18 20 18 20   Temp: 98.8 F (37.1 C) 98.2 F (36.8 C) 97.7 F (36.5 C) 97.8 F (36.6 C)  TempSrc: Oral Axillary Oral Oral  SpO2: 90% 94% 95% 96%  Weight:      Height:        Intake/Output Summary (Last 24 hours) at 03/19/2021 1516 Last data filed at 03/19/2021 3329 Gross per 24 hour  Intake 480 ml  Output 400 ml  Net 80 ml    Filed Weights   03/11/21 0556  Weight: 75.7 kg    Examination:  Constitutional: nad Eyes: no icterus ENMT: mmm Neck: normal, supple Respiratory: no wheezing Cardiovascular: rrr, no edema Abdomen: soft, nt, nd, bs+ Musculoskeletal: no clubbing / cyanosis.  Skin: no rashes Neurologic: non focal   Data Reviewed: I have independently reviewed following labs and imaging studies   CBC: Recent Labs  Lab 03/13/21 0612 03/14/21 0511 03/15/21 0601 03/16/21 0509  WBC 13.5* 10.8* 9.7 9.3  HGB 10.6* 9.1* 9.8* 9.4*  HCT 33.6* 28.3* 31.2* 29.4*  MCV 97.4 95.9 94.0 95.1  PLT 213 228 262 518    Basic Metabolic Panel: Recent Labs  Lab 03/13/21 0612 03/14/21 0511 03/15/21 0601 03/16/21 0509 03/18/21 1041  NA 137 137 136 138 140  K 4.1 3.9 3.7 3.6 3.5  CL 104 105 104 106 104  CO2 24 24 25 24 29   GLUCOSE 94 122* 98 100* 115*  BUN 21 22 21 21 21   CREATININE 1.06* 1.02* 0.94 0.96 1.08*  CALCIUM 9.2 8.8* 8.7* 8.5* 8.8*    Liver Function Tests: Recent Labs  Lab 03/14/21 0511  AST 11*  ALT 10  ALKPHOS 74  BILITOT 0.7  PROT 5.9*  ALBUMIN 2.7*    Coagulation Profile: No results for input(s): INR, PROTIME in the last 168 hours.  HbA1C: No results for input(s): HGBA1C in the last 72 hours. CBG: No results for input(s): GLUCAP  in the last 168 hours.  Recent Results (from the past 240 hour(s))  Resp Panel by RT-PCR (Flu A&B, Covid) Nasopharyngeal Swab     Status: None   Collection Time: 03/11/21  6:00 AM   Specimen: Nasopharyngeal Swab; Nasopharyngeal(NP) swabs in vial transport medium  Result Value Ref Range Status   SARS Coronavirus 2 by RT PCR NEGATIVE NEGATIVE Final    Comment: (NOTE) SARS-CoV-2 target nucleic acids are NOT DETECTED.  The SARS-CoV-2 RNA is generally detectable in upper respiratory specimens during the acute phase of infection. The lowest concentration of SARS-CoV-2 viral copies this assay can detect is 138 copies/mL. A negative result  does not preclude SARS-Cov-2 infection and should not be used as the sole basis for treatment or other patient management decisions. A negative result may occur with  improper specimen collection/handling, submission of specimen other than nasopharyngeal swab, presence of viral mutation(s) within the areas targeted by this assay, and inadequate number of viral copies(<138 copies/mL). A negative result must be combined with clinical observations, patient history, and epidemiological information. The expected result is Negative.  Fact Sheet for Patients:  EntrepreneurPulse.com.au  Fact Sheet for Healthcare Providers:  IncredibleEmployment.be  This test is no t yet approved or cleared by the [place] FDA and  has been authorized for detection and/or diagnosis of SARS-CoV-2 by FDA under an Emergency Use Authorization (EUA). This EUA will remain  in effect (meaning this test can be used) for the duration of the COVID-19 declaration under Section 564(b)(1) of the Act, 21 U.S.C.section 360bbb-3(b)(1), unless the authorization is terminated  or revoked sooner.       Influenza A by PCR NEGATIVE NEGATIVE Final   Influenza B by PCR NEGATIVE NEGATIVE Final    Comment: (NOTE) The Xpert Xpress SARS-CoV-2/FLU/RSV plus  assay is intended as an aid in the diagnosis of influenza from Nasopharyngeal swab specimens and should not be used as a sole basis for treatment. Nasal washings and aspirates are unacceptable for Xpert Xpress SARS-CoV-2/FLU/RSV testing.  Fact Sheet for Patients: EntrepreneurPulse.com.au  Fact Sheet for Healthcare Providers: IncredibleEmployment.be  This test is not yet approved or cleared by the [place] FDA and has been authorized for detection and/or diagnosis of SARS-CoV-2 by FDA under an Emergency Use Authorization (EUA). This EUA will remain in effect (meaning this test can be used) for the duration of the COVID-19 declaration under Section 564(b)(1) of the Act, 21 U.S.C. section 360bbb-3(b)(1), unless the authorization is terminated or revoked.  Performed at [facility], [address] [place], [place] [address]   Urine culture     Status: Abnormal   Collection Time: 03/11/21  6:00 AM   Specimen: In/Out Cath Urine  Result Value Ref Range Status   Specimen Description   Final    IN/OUT CATH URINE Performed at [facility], [address] [place], [place] [address]    Special Requests   Final    NONE Performed at [facility], [address], [place] [address]    Culture >=100,000 COLONIES/mL ESCHERICHIA COLI (A)  Final   Report Status 03/13/2021 FINAL  Final   Organism ID, Bacteria ESCHERICHIA COLI (A)  Final      Susceptibility   Escherichia coli - MIC*    AMPICILLIN >=32 RESISTANT Resistant     CEFAZOLIN >=64 RESISTANT Resistant     CEFEPIME <=0.12 SENSITIVE Sensitive     CEFTRIAXONE 0.5 SENSITIVE Sensitive     CIPROFLOXACIN <=0.25 SENSITIVE Sensitive     GENTAMICIN <=1 SENSITIVE Sensitive     IMIPENEM <=0.25 SENSITIVE Sensitive     NITROFURANTOIN <=16 SENSITIVE Sensitive     TRIMETH/SULFA <=20 SENSITIVE Sensitive     AMPICILLIN/SULBACTAM >=32 RESISTANT Resistant     PIP/TAZO 8  SENSITIVE Sensitive     * >=100,000 COLONIES/mL ESCHERICHIA COLI  Blood Culture (routine x 2)     Status: None   Collection Time: 03/11/21  6:01 AM   Specimen: BLOOD  Result Value Ref Range Status   Specimen Description BLOOD LEFT ARM  Final   Special Requests   Final    BOTTLES DRAWN AEROBIC AND ANAEROBIC Blood Culture adequate  volume   Culture   Final    NO GROWTH 6 DAYS Performed at [facility], [address] [place], [place] [address]    Report Status 03/17/2021 FINAL  Final  Blood Culture (routine x 2)     Status: None   Collection Time: 03/11/21  6:02 AM   Specimen: BLOOD  Result Value Ref Range Status   Specimen Description BLOOD RIGHT ARM  Final   Special Requests   Final    BOTTLES DRAWN AEROBIC AND ANAEROBIC Blood Culture adequate volume   Culture   Final    NO GROWTH 6 DAYS Performed at [facility], [address] [place], [place] [address]    Report Status 03/17/2021 FINAL  Final  MRSA PCR Screening     Status: Abnormal   Collection Time: 03/15/21 10:24 AM   Specimen: Nasopharyngeal  Result Value Ref Range Status   MRSA by PCR POSITIVE (A) NEGATIVE Final    Comment:        The GeneXpert MRSA Assay (FDA approved for NASAL specimens only), is one component of a comprehensive MRSA colonization surveillance program. It is not intended to diagnose MRSA infection nor to guide or monitor treatment for MRSA infections. RESULT CALLED TO, READ BACK BY AND VERIFIED WITH: A.Wille Celeste 1207 03/15/21 GM Performed at [facility], [place], [place] [address]   Resp Panel by RT-PCR (Flu A&B, Covid) Nasopharyngeal Swab     Status: None   Collection Time: 03/19/21  1:02 PM   Specimen: Nasopharyngeal Swab; Nasopharyngeal(NP) swabs in vial transport medium  Result Value Ref Range Status   SARS Coronavirus 2 by RT PCR NEGATIVE NEGATIVE Final    Comment: (NOTE) SARS-CoV-2 target nucleic acids are NOT DETECTED.  The SARS-CoV-2  RNA is generally detectable in upper respiratory specimens during the acute phase of infection. The lowest concentration of SARS-CoV-2 viral copies this assay can detect is 138 copies/mL. A negative result does not preclude SARS-Cov-2 infection and should not be used as the sole basis for treatment or other patient management decisions. A negative result may occur with  improper specimen collection/handling, submission of specimen other than nasopharyngeal swab, presence of viral mutation(s) within the areas targeted by this assay, and inadequate number of viral copies(<138 copies/mL). A negative result must be combined with clinical observations, patient history, and epidemiological information. The expected result is Negative.  Fact Sheet for Patients:  EntrepreneurPulse.com.au  Fact Sheet for Healthcare Providers:  IncredibleEmployment.be  This test is no t yet approved or cleared by the [place] FDA and  has been authorized for detection and/or diagnosis of SARS-CoV-2 by FDA under an Emergency Use Authorization (EUA). This EUA will remain  in effect (meaning this test can be used) for the duration of the COVID-19 declaration under Section 564(b)(1) of the Act, 21 U.S.C.section 360bbb-3(b)(1), unless the authorization is terminated  or revoked sooner.       Influenza A by PCR NEGATIVE NEGATIVE Final   Influenza B by PCR NEGATIVE NEGATIVE Final    Comment: (NOTE) The Xpert Xpress SARS-CoV-2/FLU/RSV plus assay is intended as an aid in the diagnosis of influenza from Nasopharyngeal swab specimens and should not be used as a sole basis for treatment. Nasal washings and aspirates are unacceptable for Xpert Xpress SARS-CoV-2/FLU/RSV testing.  Fact Sheet for Patients: EntrepreneurPulse.com.au  Fact Sheet for Healthcare Providers: IncredibleEmployment.be  This test is not yet approved or cleared by the  [place] and has been authorized for  detection and/or diagnosis of SARS-CoV-2 by FDA under an Emergency Use Authorization (EUA). This EUA will remain in effect (meaning this test can be used) for the duration of the COVID-19 declaration under Section 564(b)(1) of the Act, 21 U.S.C. section 360bbb-3(b)(1), unless the authorization is terminated or revoked.  Performed at [facility], [address], [place], [place] [address]      Radiology Studies: DG Chest [place] 1 View  Result Date: 03/19/2021 CLINICAL DATA:  Fever EXAM: PORTABLE CHEST 1 VIEW COMPARISON:  03/15/2021 FINDINGS: Cardiac shadow is mildly prominent but stable. Aortic calcifications are again seen. Inspiratory effort has improved when compare with the prior study. Minimal left basilar atelectasis is identified. No acute bony abnormality is seen. IMPRESSION: Overall improved aeration with mild persistent left basilar atelectasis. Electronically Signed   By: Inez Catalina M.D.   On: 03/19/2021 08:27     Marzetta Board, MD, PhD [facility]  Between 7 am - 7 pm I am available, please contact me via Amion (for emergencies) or Securechat (non urgent messages)  Between 7 pm - 7 am I am not available, please contact night coverage MD/APP via Amion

## 2021-03-19 NOTE — Progress Notes (Addendum)
Physical Therapy Treatment Patient Details Name: [name] MRN: [id number] DOB: 10/18/1925 Today's Date: 03/19/2021    History of Present Illness Patient is a 87 year old female with HTN, HLD, depression/anxiety, dementia, uterine cancer status post hysterectomy, OSA not on CPAP, RBBB, comes into the hospital with cough, shortness of breath, fever and chills.  Chest x-ray was concerning for pneumonia, she was placed on antibiotics and admitted to the hospital.  Hospital course complicated by somnolence/metabolic encephalopathy    PT Comments    Daughter in for session.  Facetime call with another family member during session to observe.  To EOB with min a x 1 and cues/time to progress to EOB.  Min a/CGA for sitting balance.  Pt does c/o dizziness in sitting.  BP stable.  Sit to stand lift obtained and tried with pt as she uses a similar lift at home.  Lift fitted to pt but did not work right so was disconnected.   Pt was sitting for an extended period +2 stand pivot to recliner was used. Another lift would have taken a while to get and pt was fatiguing from sitting.  She is up for breakfast with daughter in attendance.  Pt uses a similar lift but with no mechanical lift assistance at home.  Daughter is interested in mechanical lift and encouraged to talk with [facility] program about new equipment.  Daughter seems generally frustrated and upset and difficult to talk to regarding home.  Talking with family member on call during session and not interactive with staff.  Pt is able to tolerate bed mobility, sitting and transfer well and seems comfortable in chair for breakfast.  Discussed with unit manager family complaints   Follow Up Recommendations  Supervision/Assistance - 24 hour;Home health PT     Equipment Recommendations  None recommended by PT    Recommendations for Other Services       Precautions / Restrictions Precautions Precautions: Fall Restrictions Weight Bearing  Restrictions: No    Mobility  Bed Mobility Overal bed mobility: Needs Assistance Bed Mobility: Supine to Sit     Supine to sit: Min assist     General bed mobility comments: min assist and cues.    Transfers Overall transfer level: Needs assistance Equipment used: 2 person hand held assist Transfers: Stand Pivot Transfers           General transfer comment: stand pivot to chair wiht +2 assist  Ambulation/Gait             General Gait Details: not assessed as patient is non ambulatory at baseline   Stairs             Wheelchair Mobility    Modified Rankin (Stroke Patients Only)       Balance Overall balance assessment: Needs assistance Sitting-balance support: Single extremity supported Sitting balance-Leahy Scale: Fair Sitting balance - Comments: sitting x 5 minutes                                    Cognition Arousal/Alertness: Awake/alert Behavior During Therapy: WFL for tasks assessed/performed Overall Cognitive Status: Within Functional Limits for tasks assessed                                        Exercises      General Comments  Pertinent Vitals/Pain Pain Assessment: Faces Faces Pain Scale: Hurts little more Pain Location: L shoulder with transfer Pain Descriptors / Indicators: Sore Pain Intervention(s): Limited activity within patient's tolerance;Repositioned    Home Living                      Prior Function            PT Goals (current goals can now be found in the care plan section)      Frequency    Min 2X/week      PT Plan      Co-evaluation              AM-PAC PT "6 Clicks" Mobility   Outcome Measure  Help needed turning from your back to your side while in a flat bed without using bedrails?: A Lot Help needed moving from lying on your back to sitting on the side of a flat bed without using bedrails?: A Lot Help needed moving to and from a bed to a  chair (including a wheelchair)?: Total Help needed standing up from a chair using your arms (e.g., wheelchair or bedside chair)?: Total Help needed to walk in hospital room?: Total Help needed climbing 3-5 steps with a railing? : Total 6 Click Score: 8    End of Session Equipment Utilized During Treatment: Oxygen Activity Tolerance: Patient tolerated treatment well;Patient limited by fatigue Patient left: in bed;with call bell/phone within reach;with bed alarm set;with family/visitor present (daughter at the bedside)   PT Visit Diagnosis: Muscle weakness (generalized) (M62.81)     Time: 9622-2979 PT Time Calculation (min) (ACUTE ONLY): 31 min  Charges:  $Therapeutic Activity: 23-37 mins                    Myranda Pavone, PTA 03/19/21, 9:12 AM

## 2021-03-19 NOTE — NC FL2 (Signed)
[place] LEVEL OF CARE SCREENING TOOL     IDENTIFICATION  Patient Name: [name] Birthdate: 05-29-1926 Sex: female Admission Date (Current Location): 03/11/2021  [facility] and [place] Number:  Engineering geologist and Address:  [facility], [address], [place], [place] [address]      Provider Number: [id number]  Attending Physician Name and Address:  Caren Griffins, MD  Relative Name and Phone Number:       Current Level of Care: Hospital Recommended Level of Care: [place] Prior Approval Number:    Date Approved/Denied:   PASRR Number: [id number]  Discharge Plan: SNF    Current Diagnoses: Patient Active Problem List   Diagnosis Date Noted   Pressure injury of skin 03/14/2021   HCAP (healthcare-associated pneumonia) 03/11/2021   Sepsis ([place]) 03/11/2021   UTI (urinary tract infection) 03/11/2021   Acute respiratory failure with hypoxia ([place])    Acute UTI    Acute renal failure (ARF) ([place]) 40/05/6760   Acute metabolic encephalopathy 95/06/3266   Hyperkalemia 01/13/2021   AKI (acute kidney injury) ([place]) 01/13/2021   Rhabdomyolysis 01/13/2021   Macrocytic anemia 01/13/2021   Osteoarthritis of glenohumeral joint (Severe) (Bilateral) 08/26/2018   Osteoarthritis of AC (acromioclavicular) joint (Severe) (Bilateral) 08/26/2018   Chronic acromioclavicular (AC) joint pain (Bilateral) 08/26/2018   Spondylolisthesis, cervical region (Multilevel) 08/13/2018   Spondylolisthesis, cervicothoracic region (Multilevel) 08/13/2018   Cervical foraminal stenosis (Severe C4-5 through C6-7) 08/13/2018   Cervical radiculitis (Bilateral) 08/13/2018   Cervical  Grade 1 Anterolisthesis of C4/5, C5/6, & C7/T1 08/13/2018   Cervical facet arthropathy (Severe) (Right: C4-5, C5-6) 08/13/2018   Cervical facet syndrome (Bilateral) (R>L) 08/13/2018   Cervical radiculopathy due to osteoarthritis of spine 08/13/2018   Hx of total  knee replacement (Left) 07/27/2018   DDD (degenerative disc disease), cervical 07/27/2018   Chronic shoulder pain (Primary Area of Pain) (Bilateral) (R>L) 07/14/2018   Chronic knee pain (Secondary Area of Pain) (Right) 07/14/2018   Chronic hip pain (Tertiary Area of Pain) (Bilateral) (R>L) 07/14/2018   Chronic pain syndrome 07/14/2018   Pharmacologic therapy 07/14/2018   Disorder of skeletal system 07/14/2018   Problems influencing health status 07/14/2018   Hyperlipidemia 03/02/2016   Dementia ([place]) 03/02/2016   Vertigo 03/01/2016   Frequent falls 12/04/2014   Hyperglycemia 12/04/2014   Health care maintenance 12/04/2014   Encounter for general adult medical examination without abnormal findings 12/04/2014   URI (upper respiratory infection) 10/24/2014   OAB (overactive bladder) 06/17/2014   Pain of left thumb 05/31/2014   Pain of finger of left hand 05/31/2014   Rectocele 03/17/2014   Vaginal atrophy 03/17/2014   Cough 03/14/2014   Bladder prolapse, female, acquired 02/15/2014   Mixed stress and urge urinary incontinence 02/15/2014   Urethral prolapse 02/15/2014   Dysuria 02/15/2014   Elevated alkaline phosphatase level 01/30/2014   Abnormal levels of other serum enzymes 01/30/2014   Frequency of micturition 12/24/2013   Cervicalgia 12/12/2013   Skin lesion of breast 12/12/2013   Disorder of breast 12/12/2013   Loss of vision 10/11/2013   Vision loss, left eye 10/11/2013   Chest pain 10/11/2013   DOE (dyspnea on exertion) 10/11/2013   Anxiety disorder 10/11/2013   Other forms of dyspnea 10/11/2013   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 10/11/2013   Visual loss 10/11/2013   Cervicogenic headache 02/28/2013   History of frequent urinary tract infections 02/01/2013   Hypercholesterolemia 02/01/2013  Pulmonary hypertension ([place]) 02/01/2013   Major depressive disorder, single episode 02/01/2013   Sleep disorder 02/01/2013   Personal  history of urinary infection 02/01/2013   Pure hypercholesterolemia 02/01/2013   Other secondary pulmonary hypertension ([place]) 02/01/2013   GERD (gastroesophageal reflux disease) 12/14/2012   Chronic headaches 12/14/2012   Osteoarthritis of shoulders (Bilateral) 12/14/2012   Depression with anxiety 12/14/2012   Dizziness 09/10/2012   Essential hypertension, benign 09/10/2012   Essential (primary) hypertension 09/10/2012    Orientation RESPIRATION BLADDER Height & Weight     Self  O2 ([place] 2L) Incontinent Weight: 166 lb 12.8 oz (75.7 kg) Height:  5\' 4"  (162.6 cm)  BEHAVIORAL SYMPTOMS/MOOD NEUROLOGICAL BOWEL NUTRITION STATUS      Incontinent Diet (dys 3 diet, thin liquids)  AMBULATORY STATUS COMMUNICATION OF NEEDS Skin   Limited Assist Verbally PU Stage and Appropriate Care   PU Stage 2 Dressing:  (coccyx)                   Personal Care Assistance Level of Assistance  Bathing, Feeding, Dressing Bathing Assistance: Limited assistance Feeding assistance: Independent Dressing Assistance: Limited assistance     Functional Limitations Info  Sight, Hearing, Speech Sight Info: Adequate Hearing Info: Adequate Speech Info: Adequate    SPECIAL CARE FACTORS FREQUENCY  PT (By licensed PT), OT (By licensed OT)     PT Frequency: 5x OT Frequency: 5x            Contractures Contractures Info: Not present    Additional Factors Info  Code Status, Allergies Code Status Info: DNR Allergies Info: Ambien (Zolpidem Tartrate), Codeine           Current Medications (03/19/2021):  This is the current hospital active medication list Current Facility-Administered Medications  Medication Dose Route Frequency Provider Last Rate Last Admin   acetaminophen (TYLENOL) suppository 650 mg  650 mg Rectal Q6H PRN Ivor Costa, MD       acetaminophen (TYLENOL) tablet 650 mg  650 mg Oral Q6H PRN Ivor Costa, MD   650 mg at 03/15/21 2119   albuterol (PROVENTIL) (2.5 MG/3ML) 0.083% nebulizer  solution 2.5 mg  2.5 mg Nebulization Q4H PRN Ivor Costa, MD       benzonatate (TESSALON) capsule 200 mg  200 mg Oral TID PRN Caren Griffins, MD   200 mg at 03/15/21 2057   brimonidine (ALPHAGAN) 0.15 % ophthalmic solution 1 drop  1 drop Both Eyes BID Ivor Costa, MD   1 drop at 03/19/21 1011   calcium-vitamin D (OSCAL WITH D) 500-200 MG-UNIT per tablet 1 tablet  1 tablet Oral Daily Ivor Costa, MD   1 tablet at 03/19/21 1013   Chlorhexidine Gluconate Cloth 2 % PADS 6 each  6 each Topical Q0600 Caren Griffins, MD   6 each at 03/19/21 1015   cholecalciferol (VITAMIN D) tablet 1,000 Units  1,000 Units Oral Daily Ivor Costa, MD   1,000 Units at 03/19/21 1036   doxycycline (VIBRA-TABS) tablet 100 mg  100 mg Oral Q12H Gherghe, Costin M, MD   100 mg at 03/19/21 1030   enoxaparin (LOVENOX) injection 40 mg  40 mg Subcutaneous Q24H Ivor Costa, MD   40 mg at 03/18/21 2234   feeding supplement (ENSURE ENLIVE / ENSURE PLUS) liquid 237 mL  237 mL Oral BID BM Oswald Hillock, MD   237 mL at 03/19/21 1038   gabapentin (NEURONTIN) capsule 100 mg  100 mg Oral QHS Ivor Costa, MD   100 mg  at 03/18/21 2234   Glycerin (Adult) 2 g suppository 1 suppository  1 suppository Rectal Daily PRN Caren Griffins, MD       guaiFENesin (MUCINEX) 12 hr tablet 1,200 mg  1,200 mg Oral BID Darrick Meigs, Gagan S, MD   1,200 mg at 03/19/21 1018   hydrALAZINE (APRESOLINE) injection 5 mg  5 mg Intravenous Q2H PRN Ivor Costa, MD       mirtazapine (REMERON SOL-TAB) disintegrating tablet 15 mg  15 mg Oral BID PRN Caren Griffins, MD   15 mg at 03/18/21 1654   mometasone-formoterol (DULERA) 100-5 MCG/ACT inhaler 2 puff  2 puff Inhalation BID Ivor Costa, MD   2 puff at 03/18/21 2236   multivitamin with minerals tablet 1 tablet  1 tablet Oral Daily Oswald Hillock, MD   1 tablet at 03/19/21 1033   mupirocin ointment (BACTROBAN) 2 % 1 application  1 application Nasal BID Caren Griffins, MD   1 application at 68/12/75 1039   oxyCODONE (Oxy  IR/ROXICODONE) immediate release tablet 5 mg  5 mg Oral BID Oswald Hillock, MD   5 mg at 03/19/21 1037   pantoprazole (PROTONIX) EC tablet 40 mg  40 mg Oral BID AC Caren Griffins, MD   40 mg at 03/19/21 0750   polyethylene glycol (MIRALAX / GLYCOLAX) packet 17 g  17 g Oral Daily Caren Griffins, MD   17 g at 03/18/21 1007   predniSONE (DELTASONE) tablet 20 mg  20 mg Oral Q breakfast Ivor Costa, MD   20 mg at 03/19/21 0753   senna (SENOKOT) tablet 17.2 mg  2 tablet Oral QHS Ivor Costa, MD   17.2 mg at 03/18/21 2234   sertraline (ZOLOFT) tablet 150 mg  150 mg Oral Daily Ivor Costa, MD   150 mg at 03/19/21 1016   Travoprost (BAK Free) (TRAVATAN) 0.004 % ophthalmic solution SOLN 1 drop  1 drop Both Eyes QHS Ivor Costa, MD   1 drop at 03/18/21 2235   vitamin B-12 (CYANOCOBALAMIN) tablet 1,000 mcg  1,000 mcg Oral Daily Ivor Costa, MD   1,000 mcg at 03/19/21 1031     Discharge Medications: Please see discharge summary for a list of discharge medications.  Relevant Imaging Results:  Relevant Lab Results:   Additional Information [id number]  Gerrianne Scale Macee Venables, LCSW

## 2021-03-19 NOTE — Progress Notes (Signed)
Nutrition Follow Up Note   DOCUMENTATION CODES:   Not applicable  INTERVENTION:   Ensure Enlive po BID, each supplement provides 350 kcal and 20 grams of protein  Magic cup TID with meals, each supplement provides 290 kcal and 9 grams of protein  Dysphagia 3 diet   MVI po daily   NUTRITION DIAGNOSIS:   Increased nutrient needs related to chronic illness (COPD, wound healing) as evidenced by estimated needs.  GOAL:   Patient will meet greater than or equal to 90% of their needs -progressing   MONITOR:   PO intake, Supplement acceptance, Labs, Weight trends, Skin, I & O's  ASSESSMENT:   87 y.o. female with medical history significant of hypertension, hyperlipidemia, GERD, depression with anxiety, dementia, uterine cancer (s/p for hysterectomy, OSA not on CPAP, right bundle blockade, vertigo, chronic pain syndrome, anemia, pulmonary hypertension, rhabdomyolysis and anemia who is admitted with HCAP and UTI   Pt with improved appetite and oral intake; pt eating anywhere from sips/bites to 60% of her meals. Pt ate 50% of her breakfast this morning which included a blueberry muffin, scrambled eggs and a yogurt. Per RN report, pt is drinking her Ensure when she wants it. Magic Cups being sent on meal trays. No new weight since admit; will request daily weights.   Medications reviewed and include: oscal w/ D, vitamin D, doxycycline, lovenox, MVI, oxycodone, protonix, miralax, prednisone, senokot, B12  Labs reviewed: K 3.5 wnl, creat 1.08(H) Hgb 9.4(L), Hct 29.4(L)  Diet Order:   Diet Order             DIET DYS 3 Room service appropriate? Yes with Assist; Fluid consistency: Thin  Diet effective now                  EDUCATION NEEDS:   Education needs have been addressed  Skin:  Skin Assessment: Reviewed RN Assessment (Stage II coccyx)  Last BM:  6/13- type 5  Height:   Ht Readings from Last 1 Encounters:  03/11/21 5\' 4"  (1.626 m)    Weight:   Wt Readings from  Last 1 Encounters:  03/11/21 75.7 kg    Ideal Body Weight:  54.5 kg  BMI:  Body mass index is 28.63 kg/m.  Estimated Nutritional Needs:   Kcal:  1600-1800kcal/day  Protein:  75-85g/day  Fluid:  1.4-1.7L/day  Koleen Distance MS, RD, LDN Please refer to [facility] for RD and/or RD on-call/weekend/after hours pager

## 2021-03-19 NOTE — TOC Progression Note (Signed)
Transition of Care [facility]) - Progression Note    Patient Details  Name: [name] MRN: [id number] Date of Birth: [birth year]  [facility]) CM/SW Contact  Eileen Stanford, LCSW Phone Number: 03/19/2021, 4:06 PM  Clinical Narrative:   CSW spoke with Thayer Headings at [facility] and family is requesting [facility]. However at this time [facility] can not accept pt. Thayer Headings states to try [facility] or [facility]. Referral sent.    Expected Discharge Plan: Home/Self Care Barriers to Discharge: Continued Medical Work up  Expected Discharge Plan and Services Expected Discharge Plan: Home/Self Care       Living arrangements for the past 2 months: Single Family Home                                       Social Determinants of Health (SDOH) Interventions    Readmission Risk Interventions No flowsheet data found.

## 2021-03-19 NOTE — Progress Notes (Signed)
Physical Therapy Treatment Patient Details Name: [name] MRN: [id number] DOB: [birth year] Today's Date: 03/19/2021    History of Present Illness Patient is a 87 year old female with HTN, HLD, depression/anxiety, dementia, uterine cancer status post hysterectomy, OSA not on CPAP, RBBB, comes into the hospital with cough, shortness of breath, fever and chills.  Chest x-ray was concerning for pneumonia, she was placed on antibiotics and admitted to the hospital.  Hospital course complicated by somnolence/metabolic encephalopathy    PT Comments    Returned to assist pt back to bed with sit to stand lift.  Pt asleep but awakes to voice light touch.  Used sit to stand lift to transfer back to bed, apply sacral patch per family request, along with pericare in standing.  Some increased assist needed in sitting due to fatigue - min a x 1 then mod a x 2 to return to sitting.  Barrier cream applied and new purewic in place.  Both daughters in room and a 3rd on face time during session.  All daughters stating she is not ready to go home and did not feel she tolerated lift well.  Pt generally comfortable as would be expected for transfer and arthritic joints.  She is able to remain standing for care.  Approached possibility of SNF if they did not feel they could care for her but they insisted she would be staying here until ready to go home.  They remained difficult to talk to during session talking amongst themself and not open to discussion and short answers to questions making session challenging.  Directing care during session with what they felt she needed. Discussed with MD and RN after session.   Follow Up Recommendations  Supervision/Assistance - 24 hour;Home health PT  - Family resistant to discussion of SNF but would be appropriate if they continue to state they cannot care for her.  They wish her to remain here until ready for home.      Equipment Recommendations  None recommended by PT  -  would benefit from mechanical sit to stand lift through [facility] if available to them.   Recommendations for Other Services       Precautions / Restrictions Precautions Precautions: Fall Restrictions Weight Bearing Restrictions: No    Mobility  Bed Mobility Overal bed mobility: Needs Assistance Bed Mobility: Sit to Supine     Supine to sit: Min assist Sit to supine: Mod assist;+2 for physical assistance   General bed mobility comments: min assist and cues.    Transfers Overall transfer level: Needs assistance Equipment used: 2 person hand held assist Transfers: Stand Pivot Transfers           General transfer comment: tolerates lift well.  Ambulation/Gait             General Gait Details: not assessed as patient is non ambulatory at baseline   Stairs             Wheelchair Mobility    Modified Rankin (Stroke Patients Only)       Balance Overall balance assessment: Needs assistance Sitting-balance support: Single extremity supported Sitting balance-Leahy Scale: Fair Sitting balance - Comments: sitting x 5 minutes                                    Cognition Arousal/Alertness: Awake/alert Behavior During Therapy: WFL for tasks assessed/performed Overall Cognitive Status: Within Functional Limits for tasks assessed  Exercises      General Comments        Pertinent Vitals/Pain Pain Assessment: Faces Faces Pain Scale: Hurts little more Pain Location: back with standing but relieves with sitting Pain Descriptors / Indicators: Sore Pain Intervention(s): Limited activity within patient's tolerance;Repositioned    Home Living                      Prior Function            PT Goals (current goals can now be found in the care plan section) Progress towards PT goals: Progressing toward goals    Frequency    Min 2X/week      PT Plan       Co-evaluation              AM-PAC PT "6 Clicks" Mobility   Outcome Measure  Help needed turning from your back to your side while in a flat bed without using bedrails?: A Lot Help needed moving from lying on your back to sitting on the side of a flat bed without using bedrails?: A Lot Help needed moving to and from a bed to a chair (including a wheelchair)?: Total Help needed standing up from a chair using your arms (e.g., wheelchair or bedside chair)?: Total Help needed to walk in hospital room?: Total Help needed climbing 3-5 steps with a railing? : Total 6 Click Score: 8    End of Session Equipment Utilized During Treatment: Oxygen Activity Tolerance: Patient tolerated treatment well;Patient limited by fatigue Patient left: in bed;with call bell/phone within reach;with bed alarm set;with family/visitor present (daughter at the bedside)   PT Visit Diagnosis: Muscle weakness (generalized) (M62.81)     Time: 4132-4401 PT Time Calculation (min) (ACUTE ONLY): 16 min  Charges:  $Therapeutic Activity: 8-22 mins                    Chesley Noon, PTA 03/19/21, 12:05 PM 11:57 AM

## 2021-03-19 NOTE — Care Management Important Message (Signed)
Important Message  Patient Details  Name: [name] MRN: [id number] Date of Birth: 01/18/1926   Medicare Important Message Given:  Yes     Dannette Barbara 03/19/2021, 12:15 PM

## 2021-03-20 MED ORDER — GUAIFENESIN-DM 100-10 MG/5ML PO SYRP: 5.0000 mL | ORAL_SOLUTION | ORAL | 0 refills | Status: AC | PRN

## 2021-03-20 MED ORDER — OXYCODONE HCL 5 MG PO TABS: 5.0000 mg | ORAL_TABLET | Freq: Two times a day (BID) | ORAL | 0 refills | Status: AC

## 2021-03-20 NOTE — TOC Transition Note (Signed)
Transition of Care [facility]) - CM/SW Discharge Note   Patient Details  Name: [name] MRN: [id number] Date of Birth: 01-Nov-1925  [facility]) CM/SW Contact:  Beverly Sessions, RN Phone Number: 03/20/2021, 1:44 PM   Clinical Narrative:     Patient to discharge today  Daughter Butch Penny has accepted bed at [facility] Accepted in [place].  Hilda Blades at [facility] confirms that patient can discharge today DC info sent in [place] NP with [facility] and Thayer Headings at [facility] in agreement.   They will be providing transport to facility.  MD has signed DNR and controlled substance prescription to go with patient   Daughter updated on discharge plan and in agreement   Final next level of care: Skilled Nursing Facility Barriers to Discharge: No Barriers Identified   Patient Goals and CMS Choice Patient states their goals for this hospitalization and ongoing recovery are:: home with [facility] CMS Medicare.gov Compare Post Acute Care list provided to:: Patient Represenative (must comment) Choice offered to / list presented to : NA  Discharge Placement              Patient chooses bed at: [facility] Patient to be transferred to facility by: [facility] Name of family member notified: Butch Penny    Discharge Plan and Services                                     Social Determinants of Health (SDOH) Interventions     Readmission Risk Interventions Readmission Risk Prevention Plan 03/20/2021  Transportation Screening Complete  PCP or Specialist Appt within 3-5 Days Complete  HRI or Home Care Consult Complete  Palliative Care Screening Not Applicable  Medication Review (RN Care Manager) Complete  Some recent data might be hidden

## 2021-03-20 NOTE — Plan of Care (Signed)

## 2021-03-20 NOTE — Discharge Summary (Signed)
Physician Discharge Summary  [name] MVE:[id number] DOB: Nov 06, 1925 DOA: 03/11/2021  [facility] [place] date: 03/11/2021 Discharge date: 03/20/2021  Admitted From: home Disposition:  SNF  Recommendations for Outpatient Follow-up:  Follow up with PCP in 1-2 weeks  Home Health: none Equipment/Devices: none  Discharge Condition: stable CODE STATUS: DNR Diet recommendation: regular  HPI: Per admitting MD, [name] is a 87 y.o. female with medical history significant of hypertension, hyperlipidemia, GERD, depression with anxiety, dementia, uterine cancer (s/p for hysterectomy, OSA not on CPAP, right bundle blockade, vertigo, chronic pain syndrome, anemia, pulmonary hypertension, rhabdomyolysis, anemia, who presents with cough, shortness of breath, fever and chills. Per her daughter, patient symptoms started yesterday, including fever, chills, cough with little mucus production, shortness of breath.  Patient seems to be more confused than her baseline and agitated.  Patient does not seem to have chest pain.  Patient has nausea and vomited once per her daughter, currently no active nausea, vomiting, diarrhea or abdominal pain.  No symptoms of UTI. Per her daughter, at normal baseline patient recognizes family members, but is not orientated to time and place. Patient was found to have oxygen desaturated to 85-87% on room air, which improved to 99% on 4 L oxygen.  Hospital Course / Discharge diagnoses: Principal Problem Sepsis due to pneumonia, acute hypoxic respiratory failure-patient was placed on broad spectrum antibiotics initially with subsequent improvement in her respiratory status. She finished 8 days of antibiotics while hospitalized, her WBC has normalized and a repeat CXR shows improvement. Given lack of ambulation and prolonged immobility and poor inspiratory effort she is at risk for recurrent pneumonias. I suspect she has a degree  of silent aspiration of her own saliva given weakness / dementia as well.  Discussed with [facility] MD, they recommend patient go to one of the SNF's which are contracted with them.  Family concerned that they are not able to take care of the patient at home   Active Problems Bacteriuria-given underlying dementia not sure whether she had symptoms of this is colonization.  Completed treatment as per #1 with Cefepime Acute metabolic encephalopathy, somnolence on 6/7-CT head was unremarkable.  She underwent an MRI which was negative for stroke.  Neurology saw the patient believes this is related to her infectious process.  She is also on multiple sedating medications at home, Hold trazodone, she was able to sleep well without that. Acute kidney injury-creatinine stable and at baseline Dementia-noted Essential hypertension - continue home medications   Discharge Instructions   Allergies as of 03/20/2021       Reactions   Ambien [zolpidem Tartrate] Other (See Comments)   "hallucination"   Codeine Itching        Medication List     STOP taking these medications    traZODone 50 MG tablet Commonly known as: DESYREL       TAKE these medications    brimonidine 0.1 % Soln Commonly known as: ALPHAGAN P Place 1 drop into both eyes 2 (two) times daily.   Calcium Carbonate-Vitamin D3 600-400 MG-UNIT Tabs Take 1 tablet by mouth daily.   cholecalciferol 25 MCG (1000 UNIT) tablet Commonly known as: VITAMIN D Take 1,000 Units by mouth daily.   gabapentin 100 MG capsule Commonly known as: NEURONTIN Take 100 mg by mouth at bedtime.   guaiFENesin-dextromethorphan 100-10 MG/5ML syrup Commonly known as: ROBITUSSIN DM Take 5 mLs by mouth every 4 (four) hours as needed for cough.  lisinopril 5 MG tablet Commonly known as: ZESTRIL Take 5 mg by mouth daily.   Melatonin 10 MG Tabs Take 10 mg by mouth at bedtime.   mirtazapine 15 MG disintegrating tablet Commonly known as: REMERON  SOL-TAB Take 1 tablet (15 mg total) by mouth daily. Husband wants pt's mirtazapine to be given at 4 pm daily.   oxyCODONE 5 MG immediate release tablet Commonly known as: Oxy IR/ROXICODONE Take 1 tablet (5 mg total) by mouth 2 (two) times daily.   predniSONE 10 MG tablet Commonly known as: DELTASONE Take 10 mg by mouth daily with breakfast.   senna 8.6 MG Tabs tablet Commonly known as: SENOKOT Take 2 tablets by mouth at bedtime.   sertraline 50 MG tablet Commonly known as: ZOLOFT Take 150 mg by mouth daily.   Symbicort 80-4.5 MCG/ACT inhaler Generic drug: budesonide-formoterol Inhale 2 puffs into the lungs 2 (two) times daily.   travoprost (benzalkonium) 0.004 % ophthalmic solution Commonly known as: TRAVATAN Place 1 drop into both eyes at bedtime.   vitamin B-12 1000 MCG tablet Commonly known as: CYANOCOBALAMIN Take 1,000 mcg by mouth daily.        Contact information for after-discharge care     Destination     [facility] .   Service: Skilled Nursing Contact information: [address] [place] [phone]                     Consultations: None   Procedures/Studies:  CT Head Wo Contrast  Result Date: 03/11/2021 CLINICAL DATA:  87 year old female with unexplained altered mental status. EXAM: CT HEAD WITHOUT CONTRAST TECHNIQUE: Contiguous axial images were obtained from the base of the skull through the vertex without intravenous contrast. COMPARISON:  Brain MRI and head CT 01/13/2021. FINDINGS: Brain: Cerebral volume is stable and normal for age. No midline shift, ventriculomegaly, mass effect, evidence of mass lesion, intracranial hemorrhage or evidence of cortically based acute infarction. Stable and normal for age gray-white matter differentiation. Incidental large occipital arachnoid granulations, normal variant (series 6, image 30). Vascular: Calcified atherosclerosis at the skull base.  No suspicious intracranial vascular hyperdensity. Skull: Stable.  No acute osseous abnormality identified. Sinuses/Orbits: Visualized paranasal sinuses and mastoids are stable and well aerated. Other: No acute orbit or scalp soft tissue finding. IMPRESSION: Stable and normal for age noncontrast Head CT. Electronically Signed   By: Genevie Ann M.D.   On: 03/11/2021 06:38   MR BRAIN WO CONTRAST  Result Date: 03/14/2021 CLINICAL DATA:  Initial evaluation for mental status change, unknown cause. EXAM: MRI HEAD WITHOUT CONTRAST TECHNIQUE: Multiplanar, multiecho pulse sequences of the brain and surrounding structures were obtained without intravenous contrast. COMPARISON:  Prior CT from 03/11/2021 and MRI from 01/13/2021. FINDINGS: Brain: Temporal lobe predominant cerebral atrophy. Scattered patchy and confluent T2/FLAIR hyperintensity involving the periventricular and deep white matter both cerebral hemispheres as well as the pons, most consistent with chronic small vessel ischemic disease, mild for age. No abnormal foci of restricted diffusion to suggest acute or subacute ischemia. Gray-white matter differentiation maintained. No encephalomalacia to suggest chronic cortical infarction. No acute intracranial hemorrhage. Approximate 1 cm heterogeneous T2 hypointense lesion with prominent blooming artifact present at the left occipital lobe, suspected to reflect a small cavernoma (series 13, image 22). No other evidence for acute or chronic intracranial hemorrhage. No other mass lesion, mass effect, or midline shift. Stable ventricular size without hydrocephalus. No extra-axial fluid collection. Pituitary gland suprasellar region normal.  Midline structures intact. Vascular: Major intracranial vascular flow voids are maintained. Skull and upper cervical spine: Craniocervical junction within normal limits. Bone marrow signal intensity normal. Focal thinning of the left frontal scalp noted. Scalp soft tissues otherwise  unremarkable. Sinuses/Orbits: Globes and orbital soft tissues demonstrate no acute finding. Sequelae of prior bilateral ocular lens replacement. Mild chronic mucosal thickening noted within the ethmoidal air cells. Paranasal sinuses are otherwise clear. Trace right mastoid effusion noted, of doubtful significance. Inner ear structures grossly normal. Other: None. IMPRESSION: 1. No acute intracranial abnormality. 2. Temporal lobe predominant cerebral atrophy with mild chronic small vessel ischemic disease. 3. 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 Electronically Signed   By: Jeannine Boga M.D.   On: 03/14/2021 03:19   DG Chest Port 1 View  Result Date: 03/19/2021 CLINICAL DATA:  Fever EXAM: PORTABLE CHEST 1 VIEW COMPARISON:  03/15/2021 FINDINGS: Cardiac shadow is mildly prominent but stable. Aortic calcifications are again seen. Inspiratory effort has improved when compare with the prior study. Minimal left basilar atelectasis is identified. No acute bony abnormality is seen. IMPRESSION: Overall improved aeration with mild persistent left basilar atelectasis. Electronically Signed   By: Inez Catalina M.D.   On: 03/19/2021 08:27   DG Chest Port 1 View  Result Date: 03/15/2021 CLINICAL DATA:  Hypoxemia, shortness of breath and cough EXAM: PORTABLE CHEST 1 VIEW COMPARISON:  Radiograph 03/11/2021 FINDINGS: Unchanged cardiomediastinal silhouette. Mild interstitial opacities. There are bibasilar airspace opacities, left greater than right. Small left pleural effusion. No visible pneumothorax. There are severe bilateral shoulder degenerative changes. IMPRESSION: Persistent bibasilar airspace disease, increased on the left, with likely small left pleural effusion. Mild interstitial edema. Electronically Signed   By: Maurine Simmering   On: 03/15/2021 13:43   DG Chest Port 1 View  Result Date: 03/11/2021 CLINICAL DATA:  87 year old female with possible sepsis. EXAM: PORTABLE CHEST 1 VIEW  COMPARISON:  Portable chest 01/13/2021 and earlier. FINDINGS: Portable AP upright view at 0602 hours. Continued low lung volumes. Stable cardiac size and mediastinal contours. Further decreased bibasilar ventilation since April, but more resembles atelectasis than pneumonia. No pneumothorax or pulmonary edema. No definite pleural effusion. Visualized tracheal air column is within normal limits. Paucity of bowel gas in the upper abdomen. Severe bilateral shoulder degeneration. IMPRESSION: Low lung volumes, and increased bibasilar opacity since April but more compatible atelectasis than infection. Electronically Signed   By: Genevie Ann M.D.   On: 03/11/2021 06:18     Subjective: - no chest pain, shortness of breath, no abdominal pain, nausea or vomiting.   Discharge Exam: BP (!) 144/54 (BP Location: Right Arm)   Pulse 77   Temp 98.8 F (37.1 C) (Oral)   Resp 16   Ht 5\' 4"  (1.626 m)   Wt 75.7 kg   SpO2 95%   BMI 28.63 kg/m   General: Pt is alert, awake, not in acute distress Cardiovascular: RRR, S1/S2 +, no rubs, no gallops Respiratory: CTA bilaterally, no wheezing, no rhonchi Abdominal: Soft, NT, ND, bowel sounds + Extremities: no edema, no cyanosis   The results of significant diagnostics from this hospitalization (including imaging, microbiology, ancillary and laboratory) are listed below for reference.     Microbiology: Recent Results (from the past 240 hour(s))  Resp Panel by RT-PCR (Flu A&B, Covid) Nasopharyngeal Swab     Status: None   Collection Time: 03/11/21  6:00 AM   Specimen: Nasopharyngeal Swab; Nasopharyngeal(NP) swabs in vial transport medium  Result Value Ref  Range Status   SARS Coronavirus 2 by RT PCR NEGATIVE NEGATIVE Final    Comment: (NOTE) SARS-CoV-2 target nucleic acids are NOT DETECTED.  The SARS-CoV-2 RNA is generally detectable in upper respiratory specimens during the acute phase of infection. The lowest concentration of SARS-CoV-2 viral copies this assay  can detect is 138 copies/mL. A negative result does not preclude SARS-Cov-2 infection and should not be used as the sole basis for treatment or other patient management decisions. A negative result may occur with  improper specimen collection/handling, submission of specimen other than nasopharyngeal swab, presence of viral mutation(s) within the areas targeted by this assay, and inadequate number of viral copies(<138 copies/mL). A negative result must be combined with clinical observations, patient history, and epidemiological information. The expected result is Negative.  Fact Sheet for Patients:  EntrepreneurPulse.com.au  Fact Sheet for Healthcare Providers:  IncredibleEmployment.be  This test is no t yet approved or cleared by the [place] FDA and  has been authorized for detection and/or diagnosis of SARS-CoV-2 by FDA under an Emergency Use Authorization (EUA). This EUA will remain  in effect (meaning this test can be used) for the duration of the COVID-19 declaration under Section 564(b)(1) of the Act, 21 U.S.C.section 360bbb-3(b)(1), unless the authorization is terminated  or revoked sooner.       Influenza A by PCR NEGATIVE NEGATIVE Final   Influenza B by PCR NEGATIVE NEGATIVE Final    Comment: (NOTE) The Xpert Xpress SARS-CoV-2/FLU/RSV plus assay is intended as an aid in the diagnosis of influenza from Nasopharyngeal swab specimens and should not be used as a sole basis for treatment. Nasal washings and aspirates are unacceptable for Xpert Xpress SARS-CoV-2/FLU/RSV testing.  Fact Sheet for Patients: EntrepreneurPulse.com.au  Fact Sheet for Healthcare Providers: IncredibleEmployment.be  This test is not yet approved or cleared by the [place] FDA and has been authorized for detection and/or diagnosis of SARS-CoV-2 by FDA under an Emergency Use Authorization (EUA). This EUA will  remain in effect (meaning this test can be used) for the duration of the COVID-19 declaration under Section 564(b)(1) of the Act, 21 U.S.C. section 360bbb-3(b)(1), unless the authorization is terminated or revoked.  Performed at [facility], [address] [place], [place] [address]   Urine culture     Status: Abnormal   Collection Time: 03/11/21  6:00 AM   Specimen: In/Out Cath Urine  Result Value Ref Range Status   Specimen Description   Final    IN/OUT CATH URINE Performed at [facility], [address] [place], [place] [address]    Special Requests   Final    NONE Performed at [facility], [address] [place], [place] [address]    Culture >=100,000 COLONIES/mL ESCHERICHIA COLI (A)  Final   Report Status 03/13/2021 FINAL  Final   Organism ID, Bacteria ESCHERICHIA COLI (A)  Final      Susceptibility   Escherichia coli - MIC*    AMPICILLIN >=32 RESISTANT Resistant     CEFAZOLIN >=64 RESISTANT Resistant     CEFEPIME <=0.12 SENSITIVE Sensitive     CEFTRIAXONE 0.5 SENSITIVE Sensitive     CIPROFLOXACIN <=0.25 SENSITIVE Sensitive     GENTAMICIN <=1 SENSITIVE Sensitive     IMIPENEM <=0.25 SENSITIVE Sensitive     NITROFURANTOIN <=16 SENSITIVE Sensitive     TRIMETH/SULFA <=20 SENSITIVE Sensitive     AMPICILLIN/SULBACTAM >=32 RESISTANT Resistant     PIP/TAZO 8 SENSITIVE Sensitive     * >=100,000 COLONIES/mL  ESCHERICHIA COLI  Blood Culture (routine x 2)     Status: None   Collection Time: 03/11/21  6:01 AM   Specimen: BLOOD  Result Value Ref Range Status   Specimen Description BLOOD LEFT ARM  Final   Special Requests   Final    BOTTLES DRAWN AEROBIC AND ANAEROBIC Blood Culture adequate volume   Culture   Final    NO GROWTH 6 DAYS Performed at [facility], [address], [place], [place] [address]    Report Status 03/17/2021 FINAL  Final  Blood Culture (routine x 2)     Status: None   Collection Time: 03/11/21  6:02 AM    Specimen: BLOOD  Result Value Ref Range Status   Specimen Description BLOOD RIGHT ARM  Final   Special Requests   Final    BOTTLES DRAWN AEROBIC AND ANAEROBIC Blood Culture adequate volume   Culture   Final    NO GROWTH 6 DAYS Performed at [facility], [address], [place], [place] [address]    Report Status 03/17/2021 FINAL  Final  MRSA PCR Screening     Status: Abnormal   Collection Time: 03/15/21 10:24 AM   Specimen: Nasopharyngeal  Result Value Ref Range Status   MRSA by PCR POSITIVE (A) NEGATIVE Final    Comment:        The GeneXpert MRSA Assay (FDA approved for NASAL specimens only), is one component of a comprehensive MRSA colonization surveillance program. It is not intended to diagnose MRSA infection nor to guide or monitor treatment for MRSA infections. RESULT CALLED TO, READ BACK BY AND VERIFIED WITH: A.Wille Celeste 1207 03/15/21 GM Performed at [facility], [place], [place] [address]   Resp Panel by RT-PCR (Flu A&B, Covid) Nasopharyngeal Swab     Status: None   Collection Time: 03/19/21  1:02 PM   Specimen: Nasopharyngeal Swab; Nasopharyngeal(NP) swabs in vial transport medium  Result Value Ref Range Status   SARS Coronavirus 2 by RT PCR NEGATIVE NEGATIVE Final    Comment: (NOTE) SARS-CoV-2 target nucleic acids are NOT DETECTED.  The SARS-CoV-2 RNA is generally detectable in upper respiratory specimens during the acute phase of infection. The lowest concentration of SARS-CoV-2 viral copies this assay can detect is 138 copies/mL. A negative result does not preclude SARS-Cov-2 infection and should not be used as the sole basis for treatment or other patient management decisions. A negative result may occur with  improper specimen collection/handling, submission of specimen other than nasopharyngeal swab, presence of viral mutation(s) within the areas targeted by this assay, and inadequate number of viral copies(<138  copies/mL). A negative result must be combined with clinical observations, patient history, and epidemiological information. The expected result is Negative.  Fact Sheet for Patients:  EntrepreneurPulse.com.au  Fact Sheet for Healthcare Providers:  IncredibleEmployment.be  This test is no t yet approved or cleared by the [place] FDA and  has been authorized for detection and/or diagnosis of SARS-CoV-2 by FDA under an Emergency Use Authorization (EUA). This EUA will remain  in effect (meaning this test can be used) for the duration of the COVID-19 declaration under Section 564(b)(1) of the Act, 21 U.S.C.section 360bbb-3(b)(1), unless the authorization is terminated  or revoked sooner.       Influenza A by PCR NEGATIVE NEGATIVE Final   Influenza B by PCR NEGATIVE NEGATIVE Final    Comment: (NOTE) The Xpert Xpress SARS-CoV-2/FLU/RSV plus assay is intended as an aid in the diagnosis of  influenza from Nasopharyngeal swab specimens and should not be used as a sole basis for treatment. Nasal washings and aspirates are unacceptable for Xpert Xpress SARS-CoV-2/FLU/RSV testing.  Fact Sheet for Patients: EntrepreneurPulse.com.au  Fact Sheet for Healthcare Providers: IncredibleEmployment.be  This test is not yet approved or cleared by the [place] FDA and has been authorized for detection and/or diagnosis of SARS-CoV-2 by FDA under an Emergency Use Authorization (EUA). This EUA will remain in effect (meaning this test can be used) for the duration of the COVID-19 declaration under Section 564(b)(1) of the Act, 21 U.S.C. section 360bbb-3(b)(1), unless the authorization is terminated or revoked.  Performed at [facility], [address] [place], [place] [address]      Labs: Basic Metabolic Panel: Recent Labs  Lab 03/14/21 0511 03/15/21 0601 03/16/21 0509 03/18/21 1041  NA 137 136 138  140  K 3.9 3.7 3.6 3.5  CL 105 104 106 104  CO2 24 25 24 29   GLUCOSE 122* 98 100* 115*  BUN 22 21 21 21   CREATININE 1.02* 0.94 0.96 1.08*  CALCIUM 8.8* 8.7* 8.5* 8.8*   Liver Function Tests: Recent Labs  Lab 03/14/21 0511  AST 11*  ALT 10  ALKPHOS 74  BILITOT 0.7  PROT 5.9*  ALBUMIN 2.7*   CBC: Recent Labs  Lab 03/14/21 0511 03/15/21 0601 03/16/21 0509  WBC 10.8* 9.7 9.3  HGB 9.1* 9.8* 9.4*  HCT 28.3* 31.2* 29.4*  MCV 95.9 94.0 95.1  PLT 228 262 260   CBG: No results for input(s): GLUCAP in the last 168 hours. Hgb A1c No results for input(s): HGBA1C in the last 72 hours. Lipid Profile No results for input(s): CHOL, HDL, LDLCALC, TRIG, CHOLHDL, LDLDIRECT in the last 72 hours. Thyroid function studies No results for input(s): TSH, T4TOTAL, T3FREE, THYROIDAB in the last 72 hours.  Invalid input(s): FREET3 Urinalysis    Component Value Date/Time   COLORURINE YELLOW (A) 03/11/2021 0600   APPEARANCEUR HAZY (A) 03/11/2021 0600   APPEARANCEUR Hazy (A) 12/10/2016 1341   LABSPEC 1.015 03/11/2021 0600   PHURINE 5.0 03/11/2021 0600   GLUCOSEU NEGATIVE 03/11/2021 0600   GLUCOSEU NEGATIVE 01/23/2017 1207   HGBUR NEGATIVE 03/11/2021 0600   BILIRUBINUR NEGATIVE 03/11/2021 0600   BILIRUBINUR Negative 12/10/2016 1341   KETONESUR NEGATIVE 03/11/2021 0600   PROTEINUR NEGATIVE 03/11/2021 0600   UROBILINOGEN 0.2 01/23/2017 1207   NITRITE POSITIVE (A) 03/11/2021 0600   LEUKOCYTESUR NEGATIVE 03/11/2021 0600    FURTHER DISCHARGE INSTRUCTIONS:   Get Medicines reviewed and adjusted: Please take all your medications with you for your next visit with your Primary MD   Laboratory/radiological data: Please request your Primary MD to go over all hospital tests and procedure/radiological results at the follow up, please ask your Primary MD to get all Hospital records sent to his/her office.   In some cases, they will be blood work, cultures and biopsy results pending at the time  of your discharge. Please request that your primary care M.D. goes through all the records of your hospital data and follows up on these results.   Also Note the following: If you experience worsening of your admission symptoms, develop shortness of breath, life threatening emergency, suicidal or homicidal thoughts you must seek medical attention immediately by calling 911 or calling your MD immediately  if symptoms less severe.   You must read complete instructions/literature along with all the possible adverse reactions/side effects for all the Medicines you take and that have been prescribed to  you. Take any new Medicines after you have completely understood and accpet all the possible adverse reactions/side effects.    Do not drive when taking Pain medications or sleeping medications (Benzodaizepines)   Do not take more than prescribed Pain, Sleep and Anxiety Medications. It is not advisable to combine anxiety,sleep and pain medications without talking with your primary care practitioner   Special Instructions: If you have smoked or chewed Tobacco  in the last 2 yrs please stop smoking, stop any regular Alcohol  and or any Recreational drug use.   Wear Seat belts while driving.   Please note: You were cared for by a hospitalist during your hospital stay. Once you are discharged, your primary care physician will handle any further medical issues. Please note that NO REFILLS for any discharge medications will be authorized once you are discharged, as it is imperative that you return to your primary care physician (or establish a relationship with a primary care physician if you do not have one) for your post hospital discharge needs so that they can reassess your need for medications and monitor your lab values.  Time coordinating discharge: 40 minutes  SIGNED:  Marzetta Board, MD, PhD 03/20/2021, 12:39 PM

## 2021-03-20 NOTE — Consult Note (Addendum)
[facility] Nurse Consult Note: Reason for Consult: Consult requested for buttocks/sacrum.   Wound type: Stage 2 pressure injury .3X.3X.1cm, pink and dry.  Scattered dry scabbed areas and partial thickness skin loss in patchy areas across sacrum/bilat buttocks; appearance is consistent with moisture associated skin damage. Pressure Injury POA: Yes Topical treatment orders provided for bedside nurses to perform as folows to protect and promote healing: Foam dressing to sacrum/buttocks; change Q 3 days or PRN soiling. Please re-consult if further assistance is needed.  Thank-you,  Julien Girt MSN, [place], [place], [place], [place]

## 2021-03-20 NOTE — Progress Notes (Signed)
Tried to call report to the facility but no answer [phone] Room 115B

## 2021-10-07 DEATH — deceased

## 2024-08-24 NOTE — Telephone Encounter (Signed)
 Open in error
